# Patient Record
Sex: Female | Born: 1965 | Race: Black or African American | Hispanic: No | Marital: Single | Smoking: Former smoker
Health system: Southern US, Community
[De-identification: ages and names within clinical notes are randomized; demographics above are authoritative.]

## PROBLEM LIST (undated history)

## (undated) DIAGNOSIS — J45909 Unspecified asthma, uncomplicated: Secondary | ICD-10-CM

## (undated) DIAGNOSIS — I10 Essential (primary) hypertension: Secondary | ICD-10-CM

## (undated) DIAGNOSIS — R05 Cough: Secondary | ICD-10-CM

## (undated) DIAGNOSIS — R0602 Shortness of breath: Secondary | ICD-10-CM

## (undated) DIAGNOSIS — R600 Localized edema: Secondary | ICD-10-CM

## (undated) DIAGNOSIS — R7303 Prediabetes: Secondary | ICD-10-CM

## (undated) DIAGNOSIS — I5032 Chronic diastolic (congestive) heart failure: Secondary | ICD-10-CM

## (undated) DIAGNOSIS — R059 Cough, unspecified: Secondary | ICD-10-CM

## (undated) DIAGNOSIS — R011 Cardiac murmur, unspecified: Secondary | ICD-10-CM

## (undated) DIAGNOSIS — J189 Pneumonia, unspecified organism: Secondary | ICD-10-CM

## (undated) DIAGNOSIS — I351 Nonrheumatic aortic (valve) insufficiency: Secondary | ICD-10-CM

## (undated) DIAGNOSIS — K219 Gastro-esophageal reflux disease without esophagitis: Secondary | ICD-10-CM

## (undated) DIAGNOSIS — I77810 Thoracic aortic ectasia: Secondary | ICD-10-CM

## (undated) DIAGNOSIS — I288 Other diseases of pulmonary vessels: Secondary | ICD-10-CM

## (undated) DIAGNOSIS — J310 Chronic rhinitis: Secondary | ICD-10-CM

## (undated) HISTORY — DX: Localized edema: R60.0

## (undated) HISTORY — DX: Nonrheumatic aortic (valve) insufficiency: I35.1

## (undated) HISTORY — DX: Cardiac murmur, unspecified: R01.1

## (undated) HISTORY — DX: Pneumonia, unspecified organism: J18.9

## (undated) HISTORY — DX: Morbid (severe) obesity due to excess calories: E66.01

## (undated) HISTORY — DX: Prediabetes: R73.03

## (undated) HISTORY — DX: Gastro-esophageal reflux disease without esophagitis: K21.9

## (undated) HISTORY — DX: Chronic diastolic (congestive) heart failure: I50.32

## (undated) HISTORY — DX: Other diseases of pulmonary vessels: I28.8

## (undated) HISTORY — DX: Thoracic aortic ectasia: I77.810

## (undated) HISTORY — PX: TONSILLECTOMY: SUR1361

## (undated) HISTORY — DX: Cough, unspecified: R05.9

## (undated) HISTORY — DX: Essential (primary) hypertension: I10

## (undated) HISTORY — DX: Chronic rhinitis: J31.0

## (undated) HISTORY — PX: ANKLE RECONSTRUCTION: SHX1151

---
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
## 2005-10-28 HISTORY — PX: TOTAL VAGINAL HYSTERECTOMY: SHX2548

## 2008-09-04 ENCOUNTER — Emergency Department (HOSPITAL_COMMUNITY): Admission: EM | Admit: 2008-09-04 | Discharge: 2008-09-04 | Payer: Self-pay | Admitting: Emergency Medicine

## 2008-09-04 IMAGING — CR DG CHEST 2V
2 series · 2 of 2 positions shown · non-contrast
Comparison: None

CLINICAL DATA: Sore throat, cough, congestion, fever

CHEST - 2 VIEW

[w chest pa]
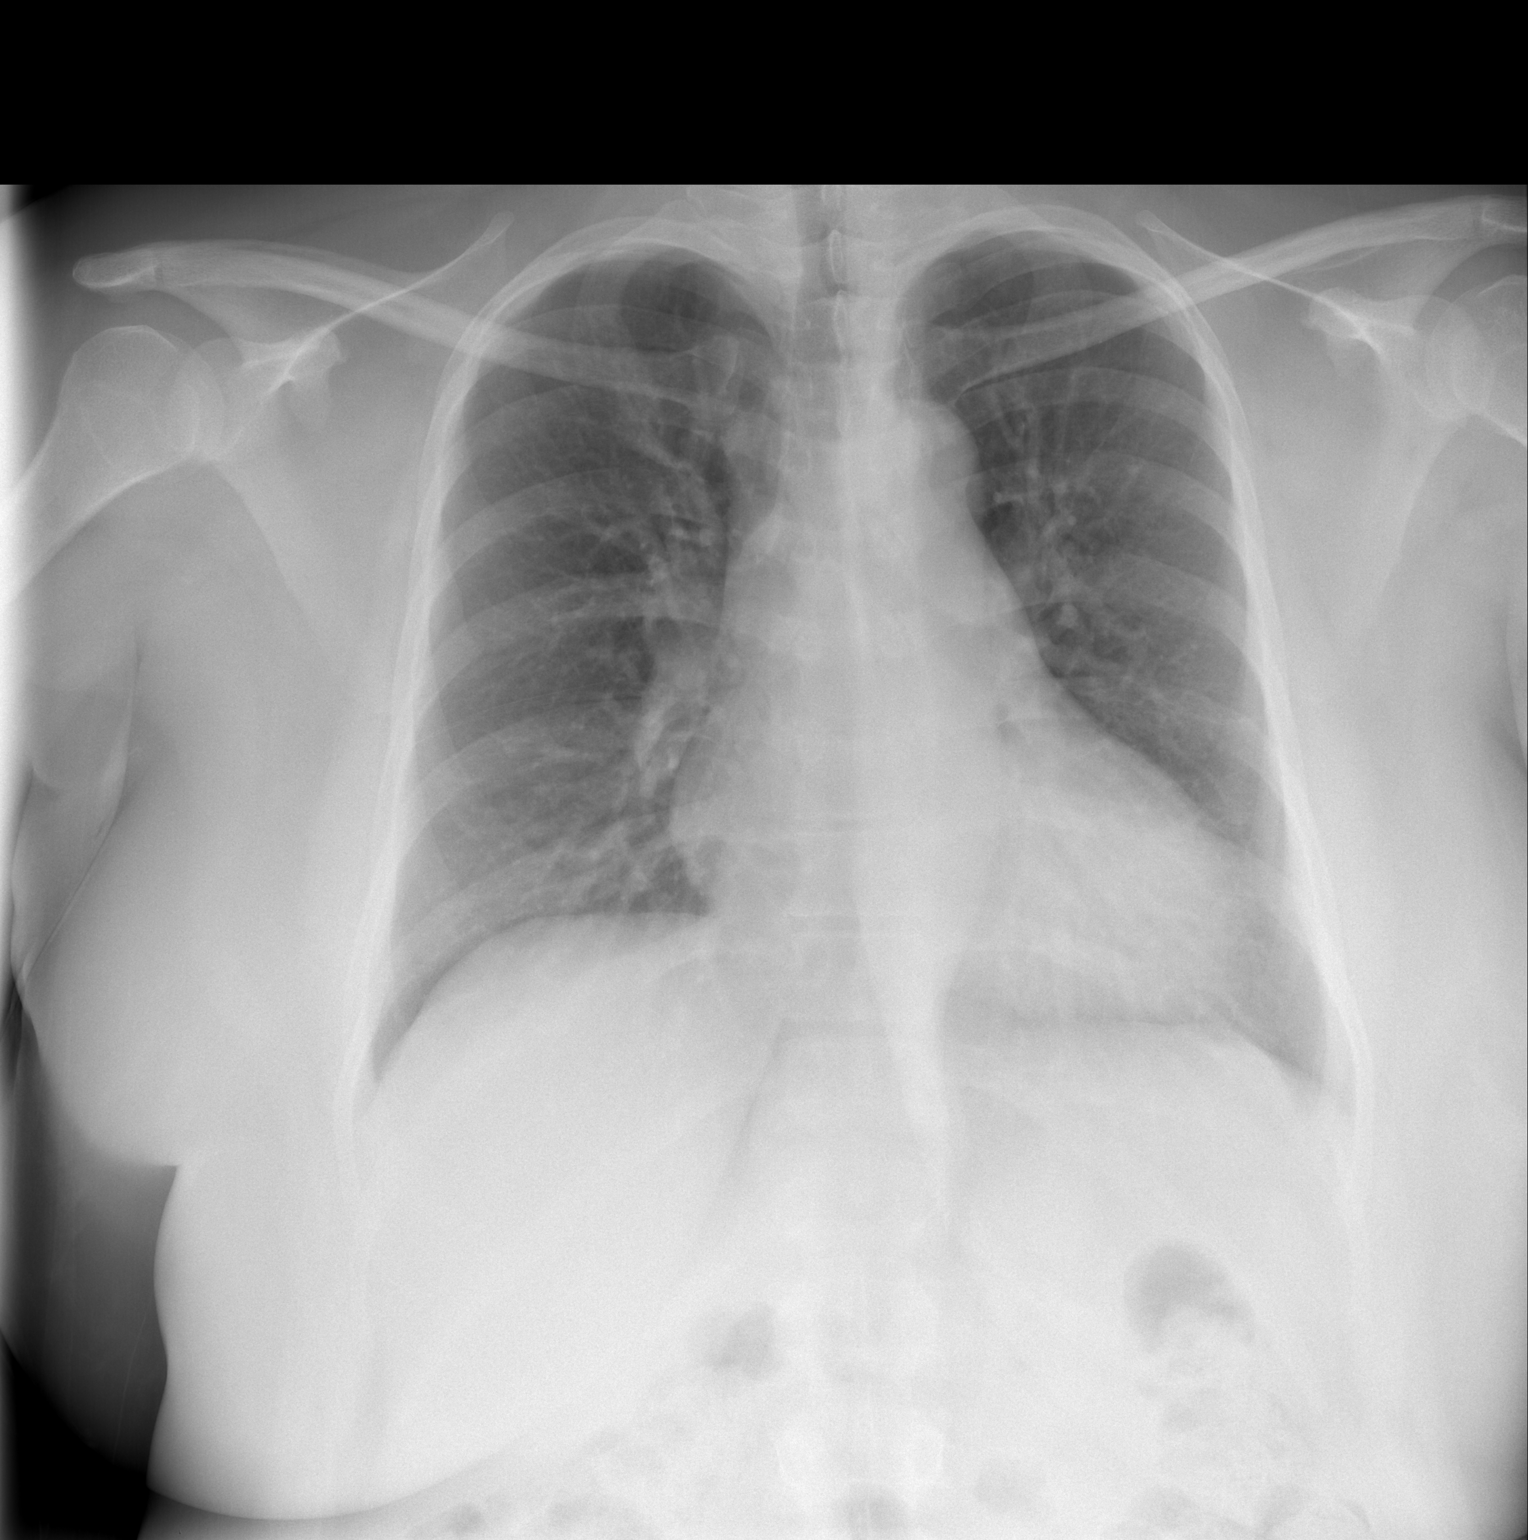

[w chest lat]
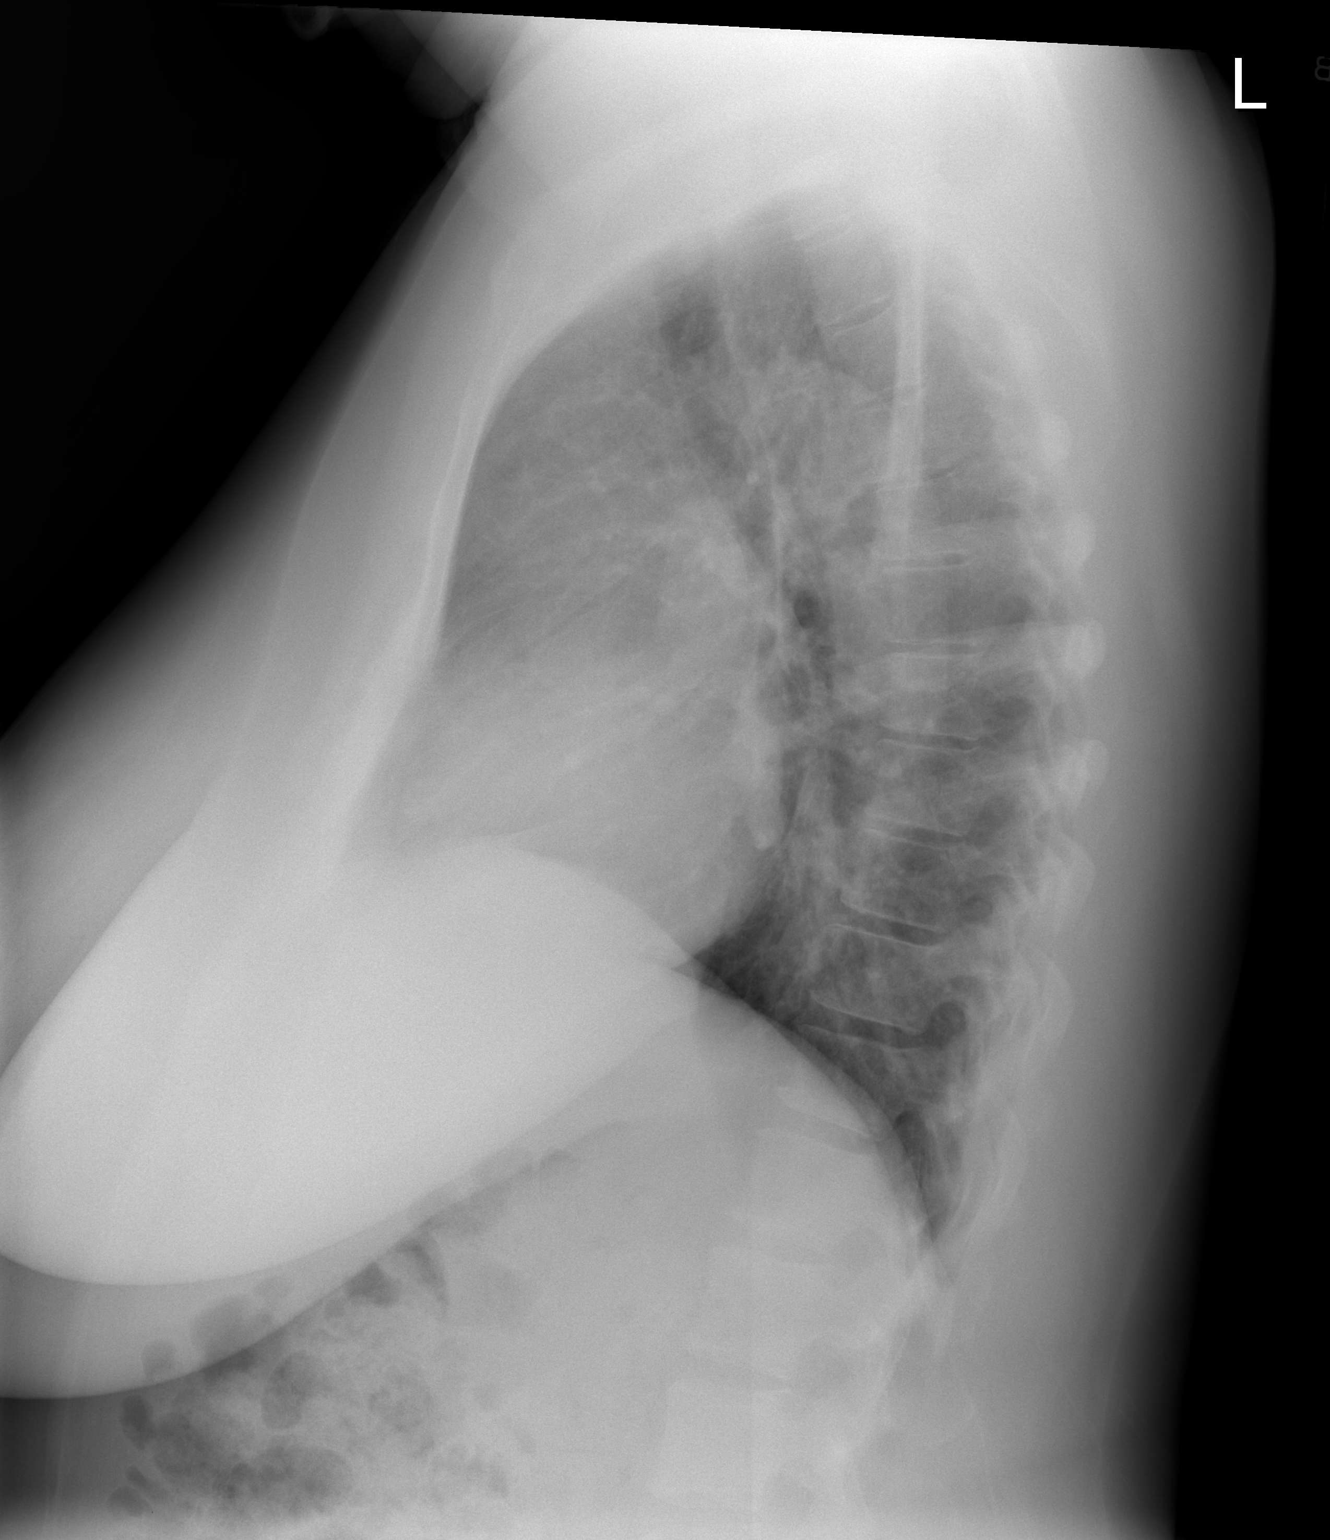

[2 of 2 positions shown; findings below may reference images not displayed]

FINDINGS: No active infiltrate or effusion is seen.  The heart is
mildly enlarged. No bony abnormality is seen.
IMPRESSION: Mild cardiomegaly. No active lung disease.

## 2008-09-04 IMAGING — CT CT NECK W/ CM
3 series · 16 of 33 positions shown, 19 images · IV contrast (APPLIED)
Comparison: None

CLINICAL DATA: Swollen painful neck

CT NECK WITH CONTRAST
TECHNIQUE: Multidetector CT imaging of the neck was performed with
intravenous contrast.
Contrast: 100 ml [9J]

[Series 4: neck_routine 3.0 b40s st · axial · 0.39mm/px · z∈[-292,-88]mm · 8 of 82 slices shown, 10 images]
[im 7/82  soft-tissue]
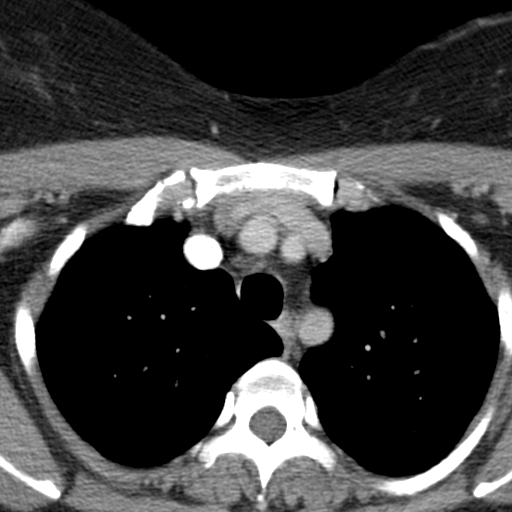
[im 7/82  bone]
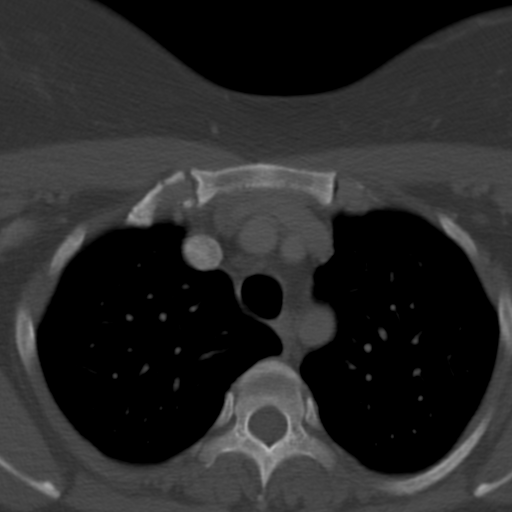
[im 19/82  bone]
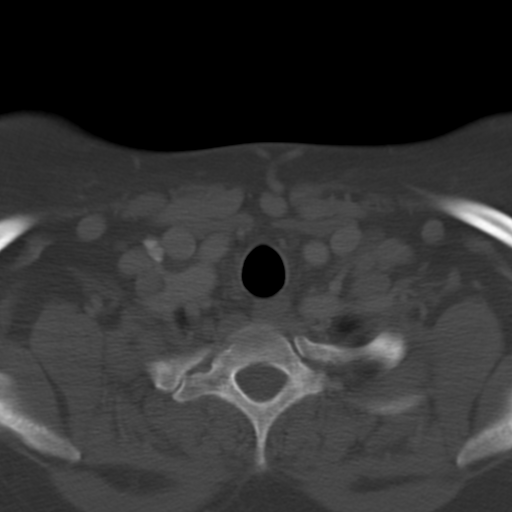
[im 25/82  bone]
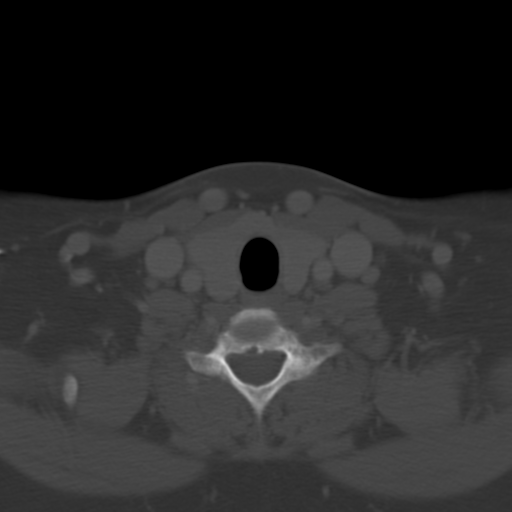
[im 38/82  bone]
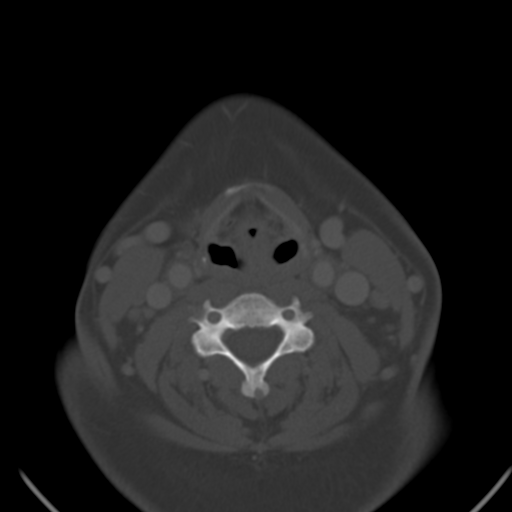
[im 44/82  soft-tissue]
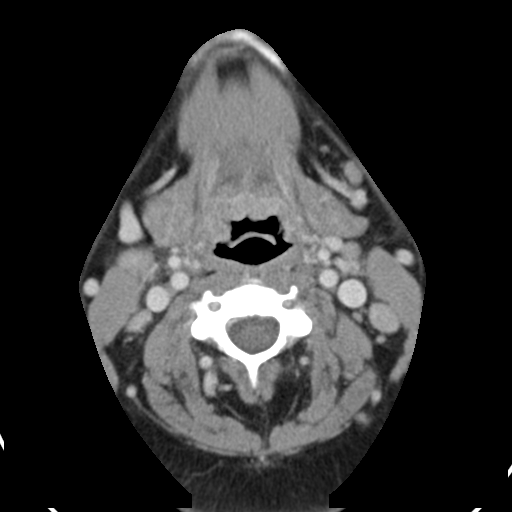
[im 44/82  bone]
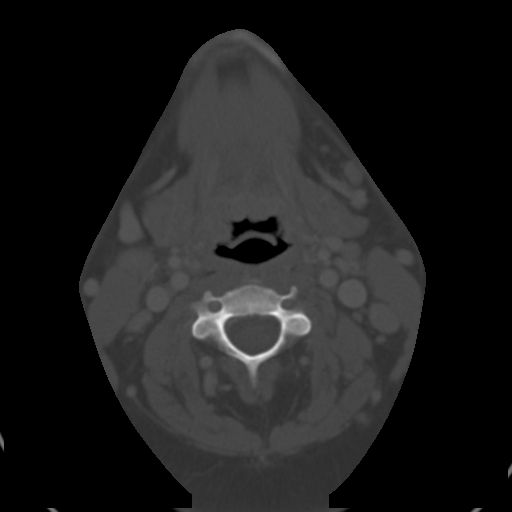
[im 57/82  bone]
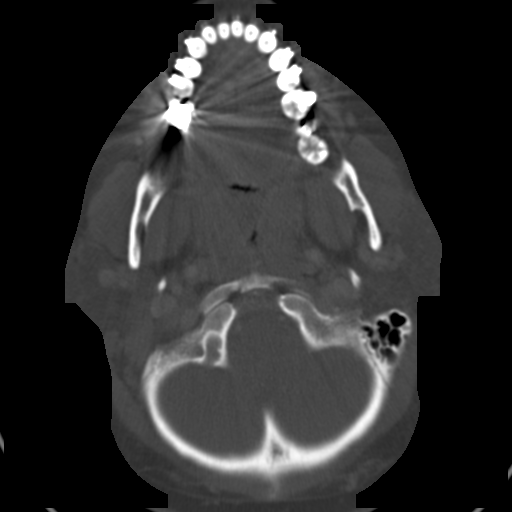
[im 63/82  bone]
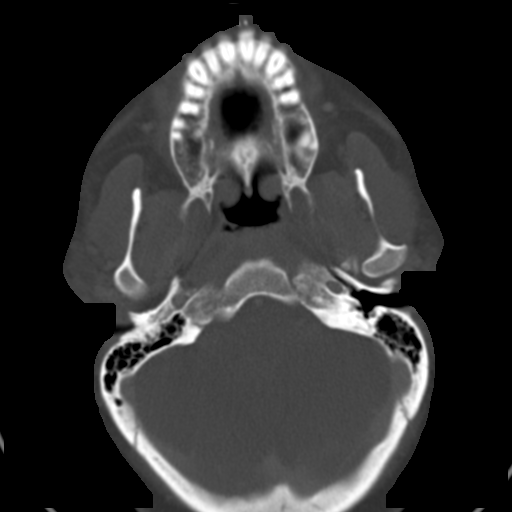
[im 75/82  bone]
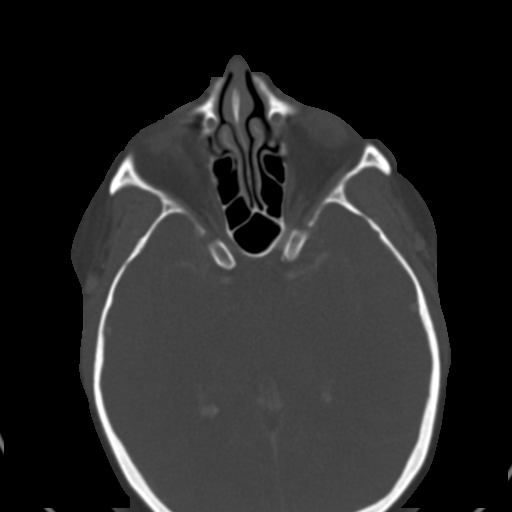

[Series 602: coronal neck · coronal · 0.48mm/px · 3 of 83 slices shown]
[im 17/83  bone]
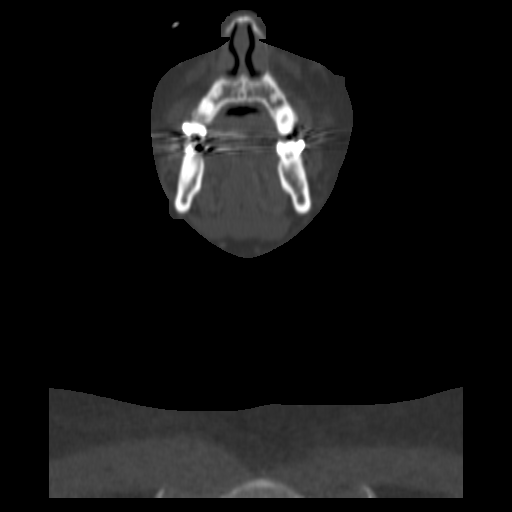
[im 33/83  bone]
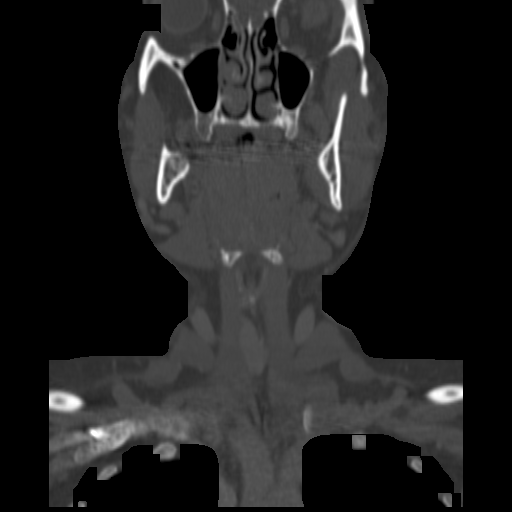
[im 50/83  bone]
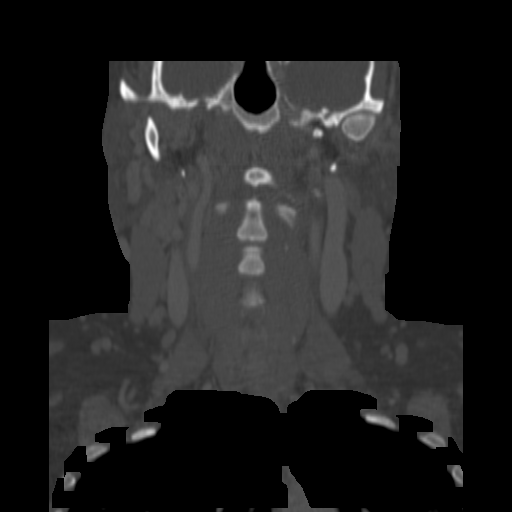

[Series 603: sagittal neck · sagittal · 0.48mm/px · 5 of 81 slices shown, 6 images]
[im 27/81  bone]
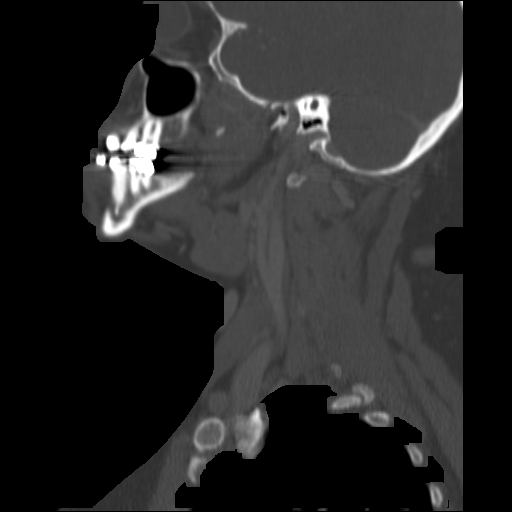
[im 34/81  bone]
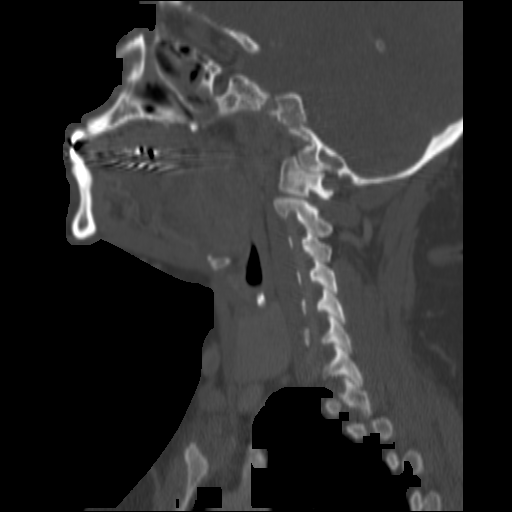
[im 41/81  soft-tissue]
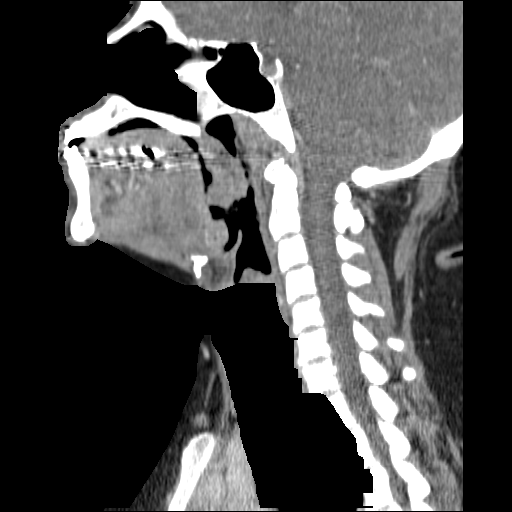
[im 41/81  bone]
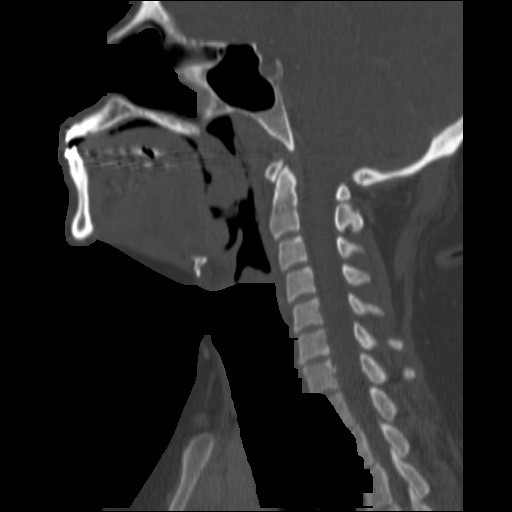
[im 47/81  bone]
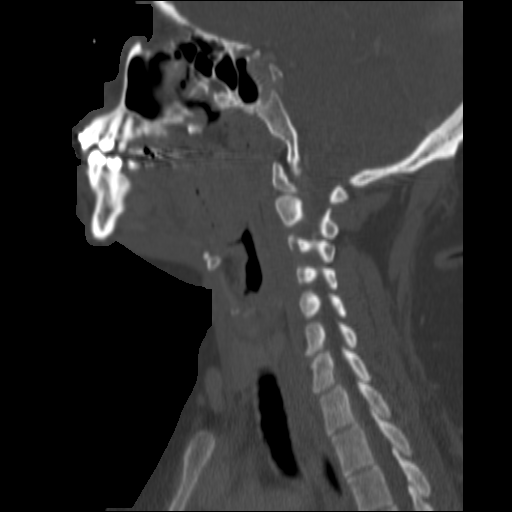
[im 54/81  bone]
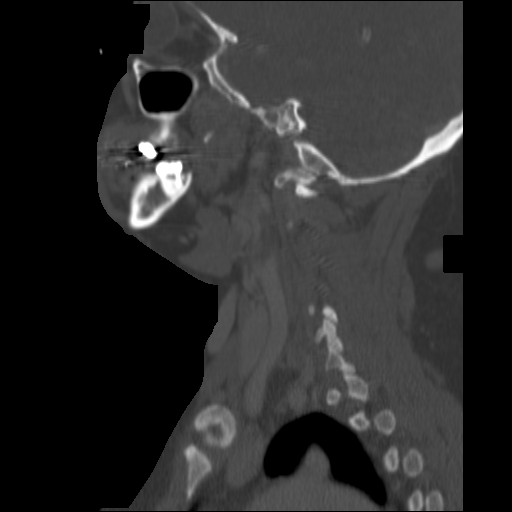

[16 of 33 positions shown; findings below may reference images not displayed]

FINDINGS: The oronasopharyngeal airway posteriorly is narrowed.
This appears be due to diffuse edema involving the tonsillar
tissue.  No abscess is seen.  The epiglottis appears normal.  The
hypopharynx is normal.  The aryepiglottic folds, pre-epiglottic fat
space, piriform sinuses, level true cords, and subglottic airway
appear normal.  The thyroid is slightly prominent diffusely with no
nodule evident.  The cervical vertebrae are normal alignment. The
parotid glands appear symmetrical and normal.
IMPRESSION: Prominent tonsillar tissue consistent with diffuse edema narrowing
the posterior oronasopharyngeal airway.  No abscess.  No
adenopathy.

## 2009-12-22 ENCOUNTER — Emergency Department (HOSPITAL_COMMUNITY): Admission: EM | Admit: 2009-12-22 | Discharge: 2009-12-22 | Payer: Self-pay | Admitting: Emergency Medicine

## 2009-12-22 IMAGING — CT CT HEAD W/O CM
1 of 2 series · 13 of 30 positions shown, 17 images · non-contrast
Comparison: None.

CLINICAL DATA: Acute stroke.  Slurred speech.  Hypertension.
Headache.

CT HEAD WITHOUT CONTRAST
TECHNIQUE: Contiguous axial images were obtained from the base of
the skull through the vertex without contrast.

[Series 2: brain · axial · 0.47mm/px · z∈[+138,+263]mm · 13 of 28 slices shown, 17 images]
[im 2/28  brain]
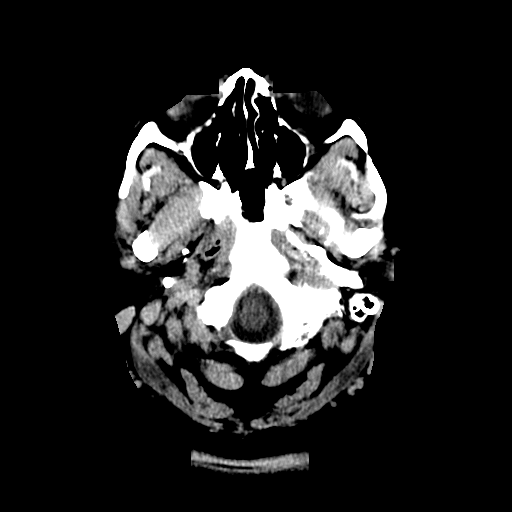
[im 2/28  bone]
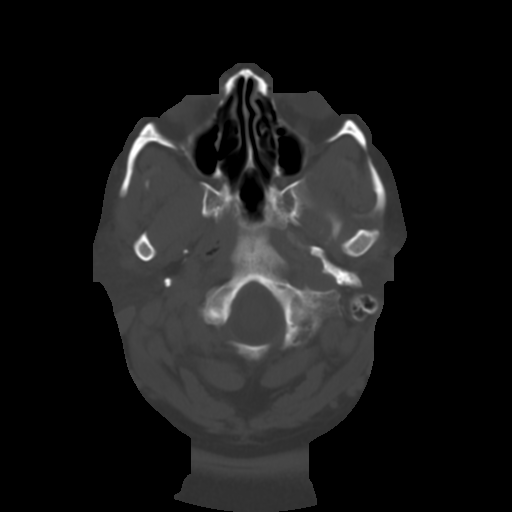
[im 4/28  brain]
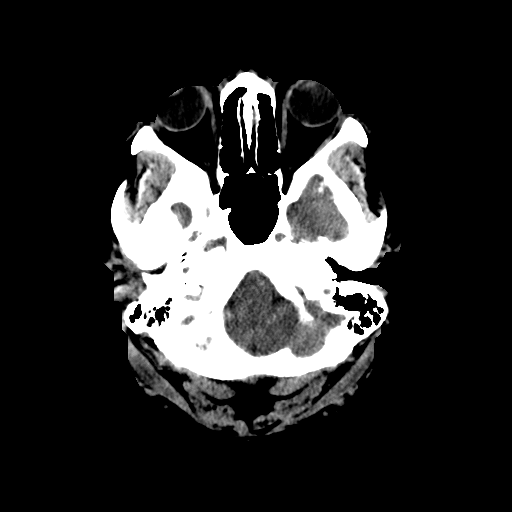
[im 6/28  brain]
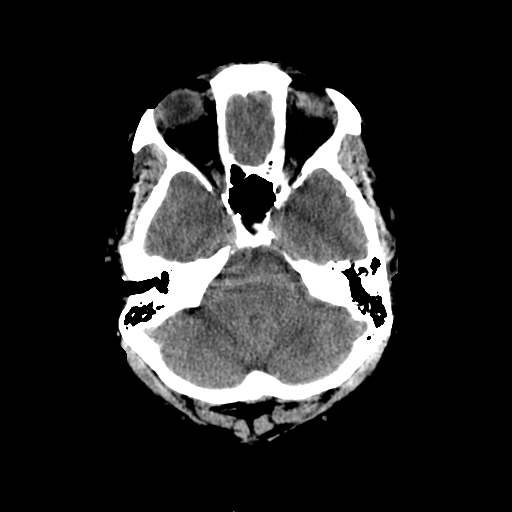
[im 8/28  brain]
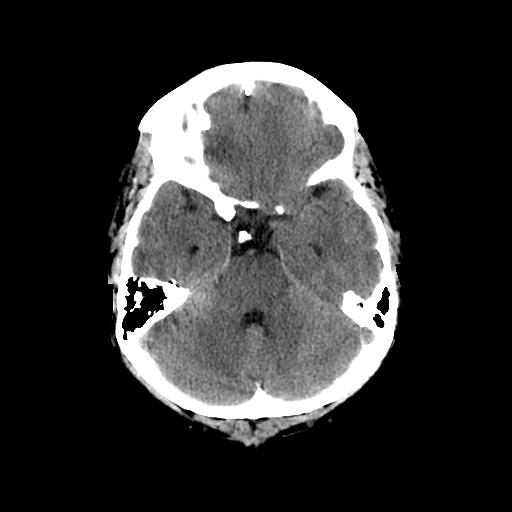
[im 10/28  brain]
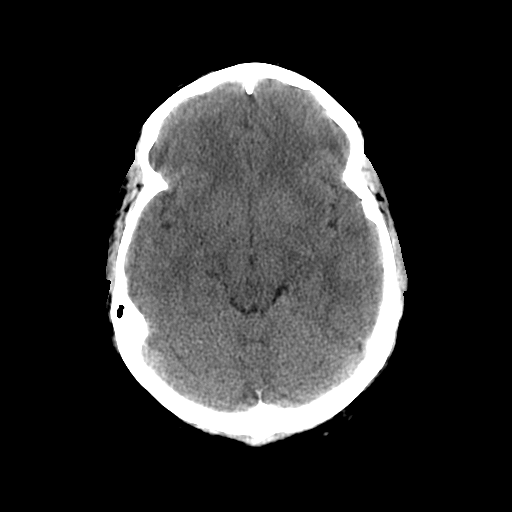
[im 10/28  bone]
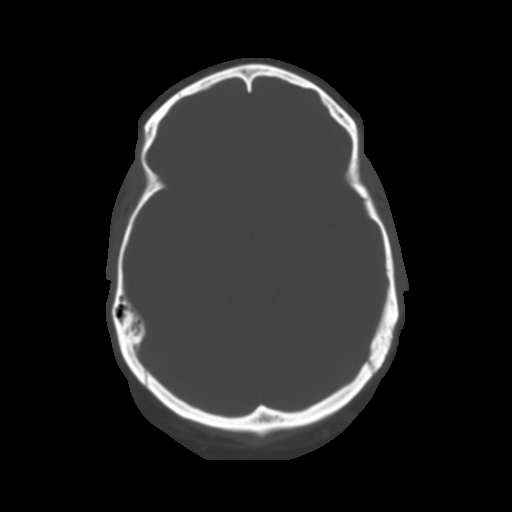
[im 12/28  brain]
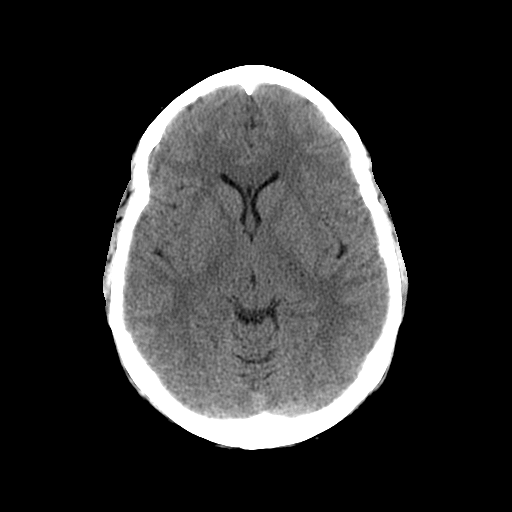
[im 14/28  brain]
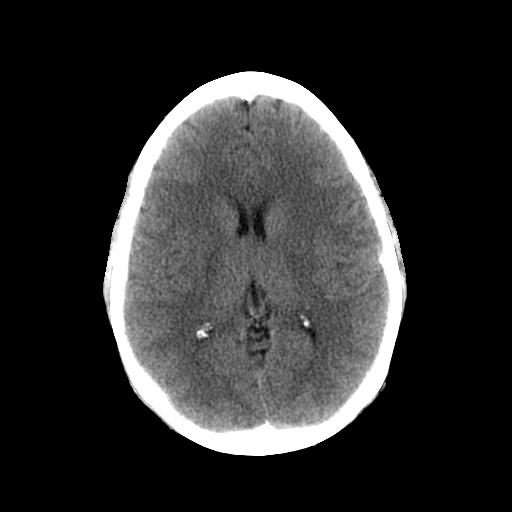
[im 16/28  brain]
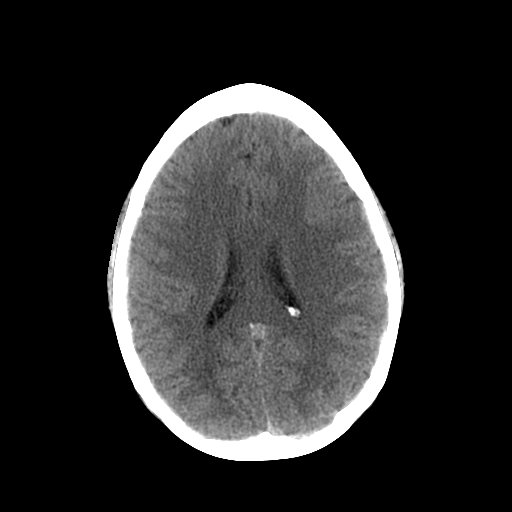
[im 18/28  brain]
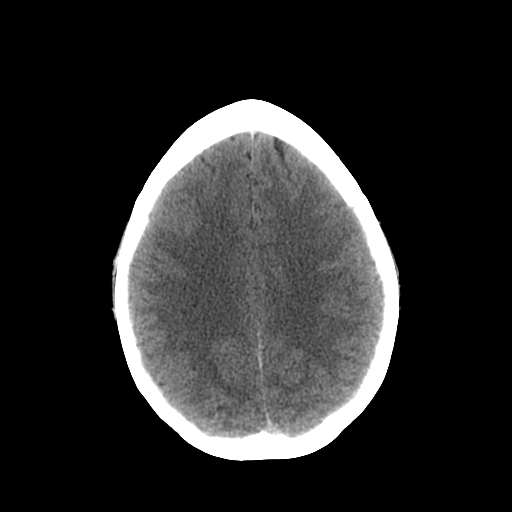
[im 18/28  bone]
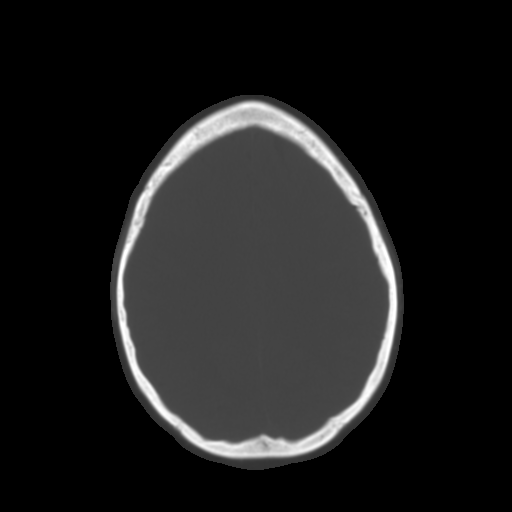
[im 20/28  brain]
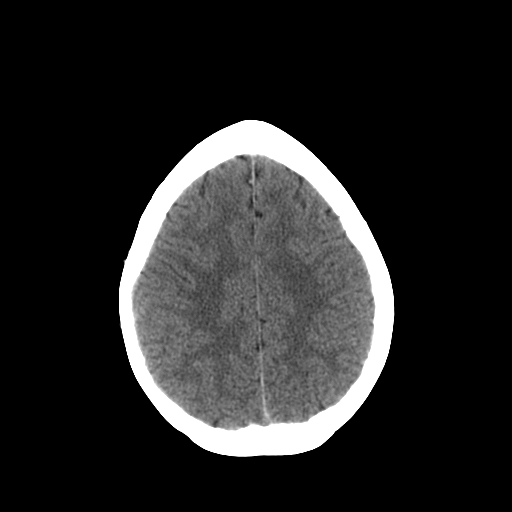
[im 22/28  brain]
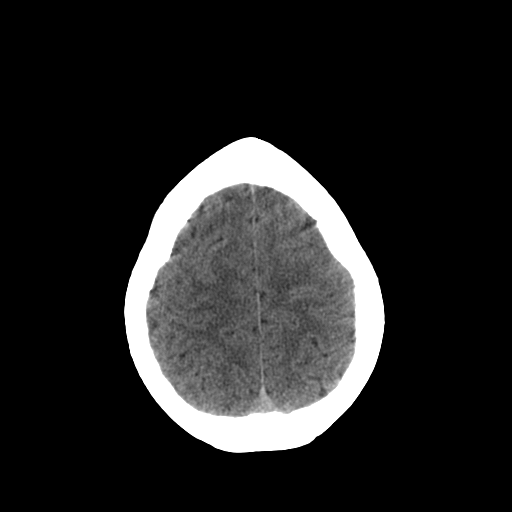
[im 24/28  brain]
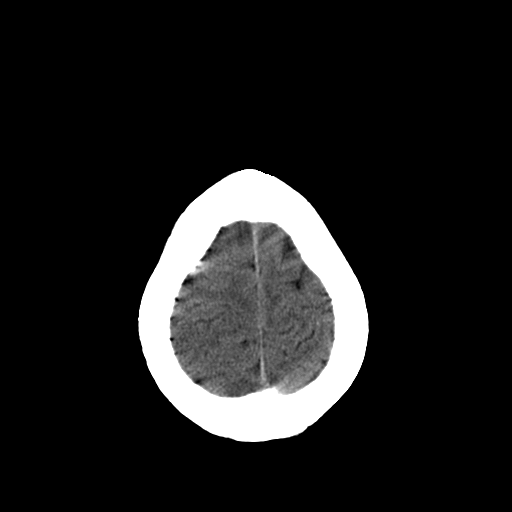
[im 26/28  brain]
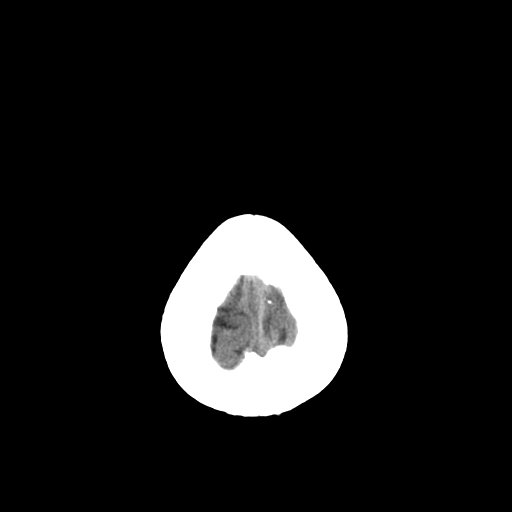
[im 26/28  bone]
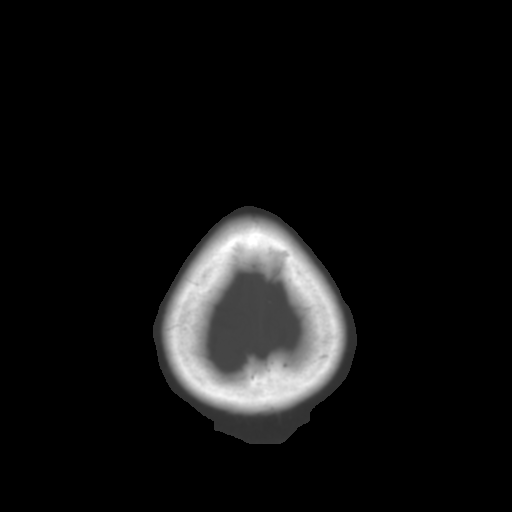

[13 of 30 positions shown; findings below may reference images not displayed]

FINDINGS: There is no evidence of intracranial hemorrhage, brain
edema or other signs of acute infarction.  There is no evidence of
intracranial mass lesion or mass effect.  No abnormal extra-axial
fluid collections are identified.

Ventricles are normal in size.  No skull abnormality identified.
IMPRESSION: Negative noncontrast head CT.

## 2011-01-18 LAB — CK TOTAL AND CKMB (NOT AT ARMC)
Relative Index: INVALID (ref 0.0–2.5)
Total CK: 85 U/L (ref 7–177)

## 2011-01-18 LAB — DIFFERENTIAL
Basophils Absolute: 0 10*3/uL (ref 0.0–0.1)
Eosinophils Relative: 3 % (ref 0–5)
Monocytes Absolute: 0.6 10*3/uL (ref 0.1–1.0)
Monocytes Relative: 8 % (ref 3–12)

## 2011-01-18 LAB — COMPREHENSIVE METABOLIC PANEL
Albumin: 3.7 g/dL (ref 3.5–5.2)
Alkaline Phosphatase: 83 U/L (ref 39–117)
Calcium: 9.1 mg/dL (ref 8.4–10.5)
Glucose, Bld: 98 mg/dL (ref 70–99)
Sodium: 141 mEq/L (ref 135–145)
Total Protein: 7.5 g/dL (ref 6.0–8.3)

## 2011-01-18 LAB — CBC
HCT: 40.5 % (ref 36.0–46.0)
MCHC: 33.2 g/dL (ref 30.0–36.0)
Platelets: 225 10*3/uL (ref 150–400)

## 2011-01-18 LAB — APTT: aPTT: 27 seconds (ref 24–37)

## 2011-01-18 LAB — PROTIME-INR: INR: 0.95 (ref 0.00–1.49)

## 2011-07-30 LAB — CBC
HCT: 39.7
RBC: 4.82
WBC: 15.1 — ABNORMAL HIGH

## 2011-07-30 LAB — URINALYSIS, ROUTINE W REFLEX MICROSCOPIC
Bilirubin Urine: NEGATIVE
Hgb urine dipstick: NEGATIVE
Protein, ur: NEGATIVE
Urobilinogen, UA: 1

## 2011-07-30 LAB — POCT I-STAT, CHEM 8
Calcium, Ion: 1.2
Chloride: 107
Glucose, Bld: 113 — ABNORMAL HIGH
HCT: 40

## 2011-07-30 LAB — DIFFERENTIAL
Basophils Relative: 0
Eosinophils Relative: 0
Lymphocytes Relative: 16
Monocytes Absolute: 0.7
Monocytes Relative: 5

## 2011-12-20 ENCOUNTER — Ambulatory Visit: Payer: Self-pay | Admitting: Internal Medicine

## 2011-12-20 ENCOUNTER — Ambulatory Visit: Payer: Self-pay

## 2011-12-20 VITALS — BP 184/101 | HR 72 | Temp 98.3°F | Resp 20 | Ht 63.5 in | Wt 234.0 lb

## 2011-12-20 DIAGNOSIS — R05 Cough: Secondary | ICD-10-CM

## 2011-12-20 DIAGNOSIS — R059 Cough, unspecified: Secondary | ICD-10-CM

## 2011-12-20 DIAGNOSIS — J40 Bronchitis, not specified as acute or chronic: Secondary | ICD-10-CM

## 2011-12-20 DIAGNOSIS — I517 Cardiomegaly: Secondary | ICD-10-CM

## 2011-12-20 DIAGNOSIS — I10 Essential (primary) hypertension: Secondary | ICD-10-CM | POA: Insufficient documentation

## 2011-12-20 DIAGNOSIS — R011 Cardiac murmur, unspecified: Secondary | ICD-10-CM

## 2011-12-20 LAB — POCT CBC
Hemoglobin: 13.1 g/dL (ref 12.2–16.2)
MCH, POC: 26.1 pg — AB (ref 27–31.2)
MCV: 81.2 fL (ref 80–97)
MID (cbc): 0.7 (ref 0–0.9)
MPV: 10.5 fL (ref 0–99.8)
POC LYMPH PERCENT: 39.1 %L (ref 10–50)
RBC: 5.01 M/uL (ref 4.04–5.48)
WBC: 6.2 10*3/uL (ref 4.6–10.2)

## 2011-12-20 LAB — BASIC METABOLIC PANEL
CO2: 27 mEq/L (ref 19–32)
Glucose, Bld: 85 mg/dL (ref 70–99)
Potassium: 4.2 mEq/L (ref 3.5–5.3)
Sodium: 142 mEq/L (ref 135–145)

## 2011-12-20 IMAGING — CR DG CHEST 2V
2 series · 2 of 2 positions shown · non-contrast
Comparison: Two-view chest x-ray [DATE] [HOSPITAL].

CLINICAL DATA: Cough.  Chest congestion.  Newly diagnosed heart
murmur.

CHEST - 2 VIEW [DATE]:

[PA]
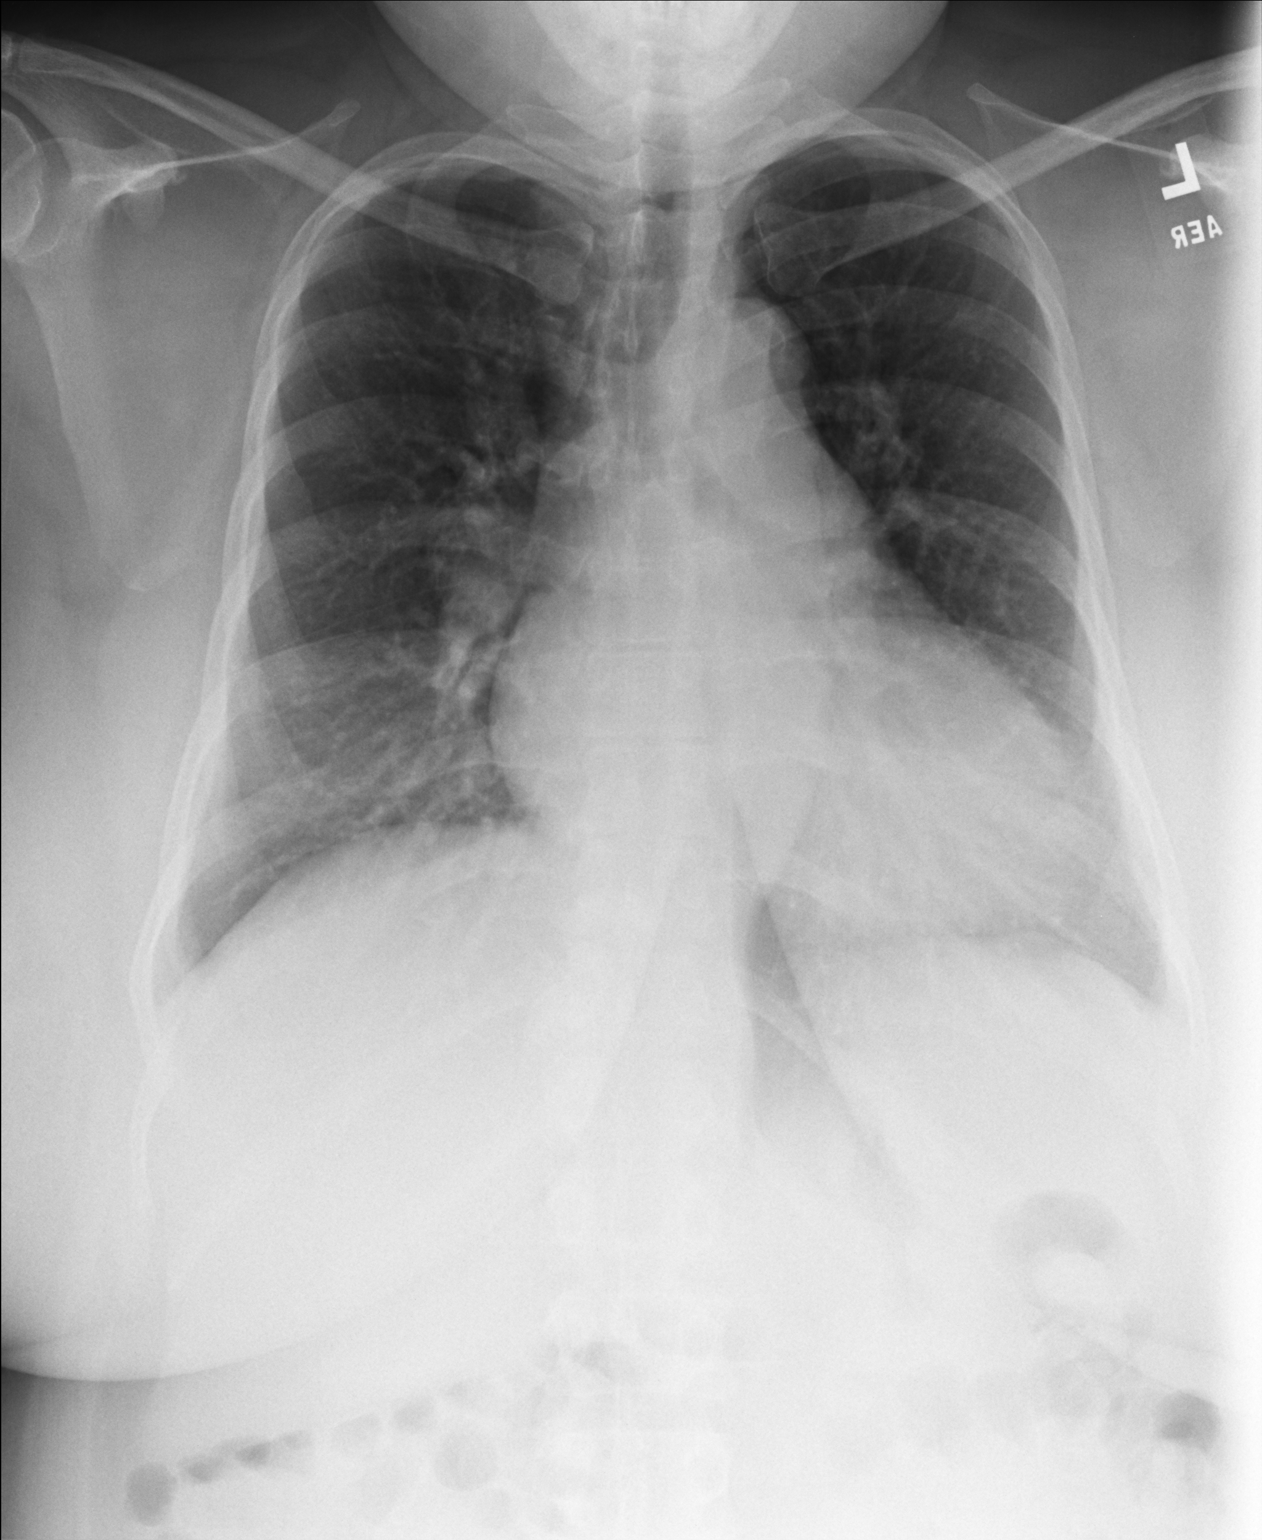

[lateral]
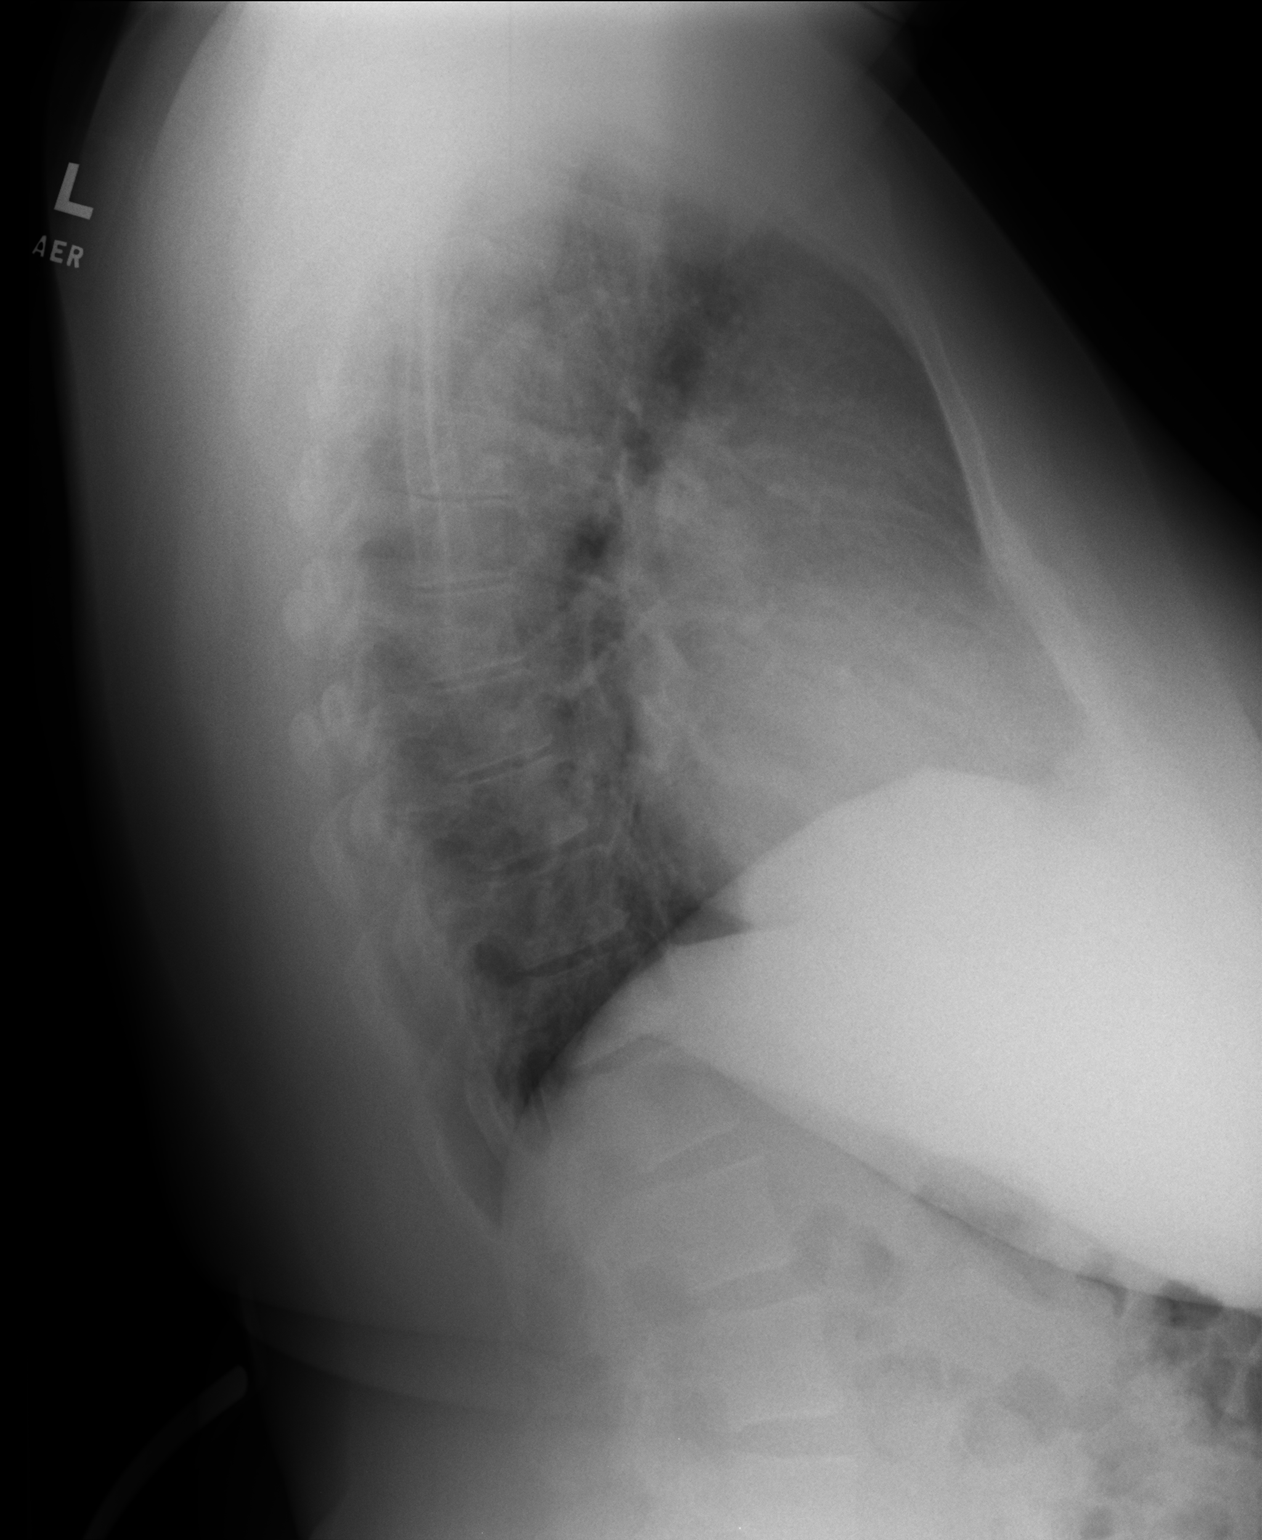

[2 of 2 positions shown; findings below may reference images not displayed]

FINDINGS: Cardiac silhouette moderately enlarged, with interval
slight increase in size in the interval.  Thoracic aorta tortuous,
unchanged.  Hilar and mediastinal contours otherwise unremarkable.
Mildly prominent bronchovascular markings diffusely and central
peribronchial thickening, more so than on the prior examination.
Lungs otherwise clear.  No pleural effusions.  Visualized bony
thorax intact.
IMPRESSION: Moderate cardiomegaly, with interval slight increase in heart size
since [OY].  Mild changes of acute bronchitis and/or asthma without
localized pneumonia.

## 2011-12-20 MED ORDER — AZITHROMYCIN 500 MG PO TABS: 500.0000 mg | ORAL_TABLET | Freq: Every day | ORAL | Status: AC

## 2011-12-20 MED ORDER — LISINOPRIL-HYDROCHLOROTHIAZIDE 10-12.5 MG PO TABS: 1.0000 | ORAL_TABLET | Freq: Every day | ORAL | Status: DC

## 2011-12-20 MED ORDER — HYDROCODONE-HOMATROPINE 5-1.5 MG/5ML PO SYRP: ORAL_SOLUTION | ORAL | Status: AC

## 2011-12-20 NOTE — Progress Notes (Signed)
Patient ID: Jessica Snow MRN: [id number], DOB: 10/31/1965, 46 y.o. Date of Encounter: 12/20/2011, 4:56 PM  Primary Physician: No primary provider on file.  Chief Complaint:  Chief Complaint  Patient presents with  . Cough    x3 days for all symptoms  . Nasal Congestion  . Sore Throat    HPI: 46 y.o. year old female presents with 3 day history of nasal congestion, sore throat, post nasal drip, and cough. Afebrile. No chills. Some myalgias. Cough is nonproductive, not associated with time of day. Nasal congestion thick and clear. Headache located along bilateral temples. Normal hearing. Normal appetite. No GI symptoms. Continues to smoke.   Has previously been on HCTZ for HTN. Stopped it "couple years" ago. Does not check at home. No chest pain, vision changes, or focal deficits. Thinks BP was 140/something [facility], approximately 6 months prior. Stopped taking medication due to did not want to pay for follow up visits. Poor diet without exercise. Never told has a cardiac murmur before. BP recheck 170/101. Denies ETOH or illicit substances.  No leg trauma, sedentary periods, or h/o cancer.  Past Medical History  Diagnosis Date  . HTN (hypertension)      Home Meds: Prior to Admission medications   Medication Sig Start Date End Date Taking? Authorizing Provider  acetaminophen (OFIRMEV) 10 MG/ML SOLN Inject 500 mg into the vein every 6 (six) hours.    Yes Historical Provider, MD    Allergies: No Known Allergies  History   Social History  . Marital Status: Single    Spouse Name: N/A    Number of Children: N/A  . Years of Education: N/A   Occupational History  . Not on file.   Social History Main Topics  . Smoking status: Current Everyday Smoker -- 0.5 packs/day    Types: Cigarettes  . Smokeless tobacco: Not on file  . Alcohol Use: Not on file  . Drug Use: Not on file  . Sexually Active: Not on file   Other Topics Concern  . Not on file   Social  History Narrative  . No narrative on file     Review of Systems: Constitutional: negative for chills, fever, night sweats or weight changes Cardiovascular: negative for chest pain or palpitations Respiratory: negative for hemoptysis, wheezing, or shortness of breath Abdominal: negative for abdominal pain, nausea, vomiting or diarrhea Dermatological: negative for rash Neurologic: negative for dizziness, syncope, or paresthesias    Physical Exam: Blood pressure 184/101, pulse 72, temperature 98.3 F (36.8 C), temperature source Oral, resp. rate 20, height 5' 3.5" (1.613 m), weight 234 lb (106.142 kg), last menstrual period 12/19/2005., Body mass index is 40.80 kg/(m^2). General: Well developed, well nourished, in no acute distress. Head: Normocephalic, atraumatic, eyes without discharge, sclera non-icteric, nares are congested. Bilateral auditory canals clear, TM's are without perforation, pearly grey with reflective cone of light bilaterally. Serous effusion bilaterally behind TM's. Maxillary sinus TTP. Oral cavity moist, dentition normal. Posterior pharynx with post nasal drip and mild erythema. No peritonsillar abscess or tonsillar exudate. Neck: Supple. No thyromegaly. Full ROM. No lymphadenopathy. Lungs: Clear bilaterally to auscultation without wheezes, rales, or rhonchi. Breathing is unlabored. Heart: RRR with S1 S2. 3/6 systolic murmur, rubs, or gallops appreciated. No carotid bruits. Abdomen: Soft, non-tender, non-distended with normoactive bowel sounds. No hepatomegaly. No rebound/guarding. No obvious abdominal masses. No abdominal bruits. Msk:  Strength and tone normal for age. Extremities: No clubbing or cyanosis. No edema. Calves supple bilaterally. Neuro:  Alert and oriented X 3. Moves all extremities spontaneously. CNII-XII grossly in tact. Psych:  Responds to questions appropriately with a normal affect.   Labs: Results for orders placed in visit on 12/20/11  POCT CBC       Component Value Range   WBC 6.2  4.6 - 10.2 (K/uL)   Lymph, poc 2.4  0.6 - 3.4    POC LYMPH PERCENT 39.1  10 - 50 (%L)   MID (cbc) 0.7  0 - 0.9    POC MID % 10.7  0 - 12 (%M)   POC Granulocyte 3.1  2 - 6.9    Granulocyte percent 50.2  37 - 80 (%G)   RBC 5.01  4.04 - 5.48 (M/uL)   Hemoglobin 13.1  12.2 - 16.2 (g/dL)   HCT, POC 98.1  19.1 - 47.9 (%)   MCV 81.2  80 - 97 (fL)   MCH, POC 26.1 (*) 27 - 31.2 (pg)   MCHC 32.2  31.8 - 35.4 (g/dL)   RDW, POC 47.8     Platelet Count, POC 244  142 - 424 (K/uL)   MPV 10.5  0 - 99.8 (fL)   BMP and BNP pending  [facility] reading (PRIMARY) by  Dr. Merla Riches. Cardiomegaly. ? Aortic impression with abnormal tracheal deviation. Increased markings RLL.    ASSESSMENT AND PLAN:  46 y.o. year old female with uncontrolled HTN, newly diagnosed cardiac murmur, bronchitis, and cough. 1. HTN -Uncontrolled -Start lisinopril/HCTZ 10/12.5 mg 1 po daily #30 RF1 -Await labs -Healthy diet -Recheck 3-4 weeks -RTC/ER precautions  2. Cardiac murmur -Per patient this is a new diagnosis -Referral to cardiology for evaluation and treatment of murmur and abnormal CXR/aortic impression into trachea.  3. Bronchitis/cough -Azithromycin 500 mg #5 1 po daily no RF -Hycodan #4 oz 1 tsp po q 4-6 hours prn cough no RF SED -Mucinex -Tylenol/Motrin prn -Rest/fluids -RTC precautions -RTC 3-5 days if no improvement  Elinor Dodge, PA-C 12/20/2011 4:56 PM

## 2011-12-20 NOTE — Progress Notes (Deleted)
  Subjective:    Patient ID: Jessica Snow, female    DOB: November 02, 1965, 46 y.o.   MRN: [id number]  HPI    Review of Systems     Objective:   Physical Exam        Assessment & Plan:

## 2011-12-21 ENCOUNTER — Telehealth: Payer: Self-pay

## 2011-12-21 LAB — BRAIN NATRIURETIC PEPTIDE: Brain Natriuretic Peptide: 26.1 pg/mL (ref 0.0–100.0)

## 2011-12-21 NOTE — Telephone Encounter (Signed)
Pt states that she spoke with the clinical TL earlier today regarding her heart,  and just wanted to ask did she still have to see a heart doctor.

## 2011-12-22 NOTE — Telephone Encounter (Signed)
Spoke with patient, advised we would still need her to see cards for eval of murmur and her aorta.  Pt understood.

## 2012-01-20 ENCOUNTER — Encounter: Payer: Self-pay | Admitting: Cardiology

## 2012-01-20 ENCOUNTER — Ambulatory Visit (INDEPENDENT_AMBULATORY_CARE_PROVIDER_SITE_OTHER): Payer: Self-pay | Admitting: Cardiology

## 2012-01-20 VITALS — BP 150/85 | HR 69 | Ht 63.0 in | Wt 231.0 lb

## 2012-01-20 DIAGNOSIS — I10 Essential (primary) hypertension: Secondary | ICD-10-CM

## 2012-01-20 DIAGNOSIS — R011 Cardiac murmur, unspecified: Secondary | ICD-10-CM

## 2012-01-20 DIAGNOSIS — E669 Obesity, unspecified: Secondary | ICD-10-CM

## 2012-01-20 NOTE — Patient Instructions (Signed)
Your physician has requested that you have an echocardiogram. Echocardiography is a painless test that uses sound waves to create images of your heart. It provides your doctor with information about the size and shape of your heart and how well your heart's chambers and valves are working. This procedure takes approximately one hour. There are no restrictions for this procedure.  The current medical regimen is effective;  continue present plan and medications.'  Follow up with Dr Antoine Poche as needed based on results

## 2012-01-20 NOTE — Progress Notes (Signed)
   HPI The patient presents for evaluation of an abnormal CXR with cardiomegaly. She was also noted to have a heart murmur which has not been described previously. She was being seen in urgent care for treatment of bronchitis. He noted the above. She has not had any prior cardiac history. She has never had any cardiac testing. She does describe some dyspnea with activity such as walking a long distance or up a flight of stairs. However, this has been chronic. She does not describe PND or orthopnea.  She does not describe chest pressure, neck or arm discomfort. She doesn't have palpitations, presyncope or syncope. Her weights have been stable. She doesn't edema. She does snore but doesn't have any other symptoms consistent with sleep apnea. He was recently started on medications for her blood pressure.  No Known Allergies  Current Outpatient Prescriptions  Medication Sig Dispense Refill  . lisinopril-hydrochlorothiazide (PRINZIDE,ZESTORETIC) 10-12.5 MG per tablet Take 1 tablet by mouth daily.  90 tablet  3    Past Medical History  Diagnosis Date  . HTN (hypertension)   . Pneumonia     Past Surgical History  Procedure Date  . Total vaginal hysterectomy 2007    Family History  Problem Relation Age of Onset  . Hypertension Mother   . Diabetes Mother     History   Social History  . Marital Status: Single    Spouse Name: N/A    Number of Children: N/A  . Years of Education: N/A   Occupational History  . Production designer, theatre/television/film at [employer].    Social History Main Topics  . Smoking status: Current Everyday Smoker -- 0.5 packs/day for 10 years    Types: Cigarettes  . Smokeless tobacco: Not on file   Comment: Off and on.  . Alcohol Use: Not on file  . Drug Use: Not on file  . Sexually Active: Not on file   Other Topics Concern  . Not on file   Social History Narrative   Lives with two children.      ROS:  Positive for headaches, occasional diarrhea and constipation.  Otherwise as stated in  the HPI and negative for all other systems.  PHYSICAL EXAM VS  BP  150/85, HR 69 GENERAL:  Well appearing HEENT:  Pupils equal round and reactive, fundi not visualized, oral mucosa unremarkable NECK:  No jugular venous distention, waveform within normal limits, carotid upstroke brisk and symmetric, no bruits, no thyromegaly LYMPHATICS:  No cervical, inguinal adenopathy LUNGS:  Clear to auscultation bilaterally BACK:  No CVA tenderness CHEST:  Unremarkable HEART:  PMI not displaced or sustained,S1 and S2 within normal limits, no S3, no S4, no clicks, no rubs, apical systolic murmur at the left upper sternal border 2/6 early peaking without diastolic murmur. ABD:  Flat, positive bowel sounds normal in frequency in pitch, no bruits, no rebound, no guarding, no midline pulsatile mass, no hepatomegaly, no splenomegaly, obese EXT:  2 plus pulses throughout, no edema, no cyanosis no clubbing SKIN:  No rashes no nodules NEURO:  Cranial nerves II through XII grossly intact, motor grossly intact throughout PSYCH:  Cognitively intact, oriented to person place and time  EKG:  Sinus rhythm, rate 69, axis within normal limits, intervals within normal limits, no acute ST-T wave changes.  ASSESSMENT AND PLAN

## 2012-01-20 NOTE — Assessment & Plan Note (Signed)
I have asked her to watch her BP with readings [facility] her house.  I also suggested weight loss.  At this point I will not change the meds as this was recently started.

## 2012-01-20 NOTE — Assessment & Plan Note (Signed)
I suspect a flow murmur or may be aortic sclerosis. I do not suspect significant pathology but I will followup with an echo. Further evaluation and treatment will be based on this result. Of note this will also evaluate the questionable cardiomegaly seen on chest x-ray.

## 2012-01-20 NOTE — Assessment & Plan Note (Signed)
The patient understands the need to lose weight with diet and exercise. We have discussed specific strategies for this.  I suggested weight watchers.

## 2012-01-31 ENCOUNTER — Ambulatory Visit (HOSPITAL_COMMUNITY): Payer: Self-pay | Attending: Cardiology

## 2012-01-31 ENCOUNTER — Other Ambulatory Visit: Payer: Self-pay

## 2012-01-31 DIAGNOSIS — R011 Cardiac murmur, unspecified: Secondary | ICD-10-CM | POA: Insufficient documentation

## 2012-01-31 DIAGNOSIS — I10 Essential (primary) hypertension: Secondary | ICD-10-CM | POA: Insufficient documentation

## 2013-10-08 ENCOUNTER — Encounter (HOSPITAL_COMMUNITY): Payer: Self-pay | Admitting: Emergency Medicine

## 2013-10-08 ENCOUNTER — Emergency Department (HOSPITAL_COMMUNITY)
Admission: EM | Admit: 2013-10-08 | Discharge: 2013-10-08 | Disposition: A | Discharge status: Home / Self Care | Payer: Self-pay | Attending: Emergency Medicine | Admitting: Emergency Medicine

## 2013-10-08 ENCOUNTER — Emergency Department (HOSPITAL_COMMUNITY): Payer: Self-pay

## 2013-10-08 DIAGNOSIS — I10 Essential (primary) hypertension: Secondary | ICD-10-CM | POA: Insufficient documentation

## 2013-10-08 DIAGNOSIS — Z79899 Other long term (current) drug therapy: Secondary | ICD-10-CM | POA: Insufficient documentation

## 2013-10-08 DIAGNOSIS — F17200 Nicotine dependence, unspecified, uncomplicated: Secondary | ICD-10-CM | POA: Insufficient documentation

## 2013-10-08 DIAGNOSIS — Z8701 Personal history of pneumonia (recurrent): Secondary | ICD-10-CM | POA: Insufficient documentation

## 2013-10-08 DIAGNOSIS — J40 Bronchitis, not specified as acute or chronic: Secondary | ICD-10-CM | POA: Insufficient documentation

## 2013-10-08 LAB — CBC WITH DIFFERENTIAL/PLATELET
Basophils Absolute: 0 10*3/uL (ref 0.0–0.1)
Basophils Relative: 1 % (ref 0–1)
Eosinophils Absolute: 0.2 10*3/uL (ref 0.0–0.7)
Eosinophils Relative: 3 % (ref 0–5)
Lymphs Abs: 1.9 10*3/uL (ref 0.7–4.0)
MCH: 26.6 pg (ref 26.0–34.0)
MCV: 81.4 fL (ref 78.0–100.0)
Neutrophils Relative %: 55 % (ref 43–77)
Platelets: 218 10*3/uL (ref 150–400)
RBC: 4.88 MIL/uL (ref 3.87–5.11)
RDW: 14.8 % (ref 11.5–15.5)
WBC: 6 10*3/uL (ref 4.0–10.5)

## 2013-10-08 LAB — BASIC METABOLIC PANEL
Calcium: 9.2 mg/dL (ref 8.4–10.5)
GFR calc Af Amer: >90 mL/min (ref >90)
GFR calc non Af Amer: >90 mL/min (ref >90)
Potassium: 3.1 mEq/L — ABNORMAL LOW (ref 3.5–5.1)
Sodium: 140 mEq/L (ref 135–145)

## 2013-10-08 LAB — TROPONIN I: Troponin I: <0.3 ng/mL (ref <0.30)

## 2013-10-08 LAB — PRO B NATRIURETIC PEPTIDE: Pro B Natriuretic peptide (BNP): 308.7 pg/mL — ABNORMAL HIGH (ref 0–125)

## 2013-10-08 IMAGING — CR DG CHEST 2V
2 series · 2 of 2 positions shown · non-contrast
Comparison: [DATE].

CLINICAL DATA: Cough and wheezing.

EXAM:
CHEST  2 VIEW

[w chest pa]
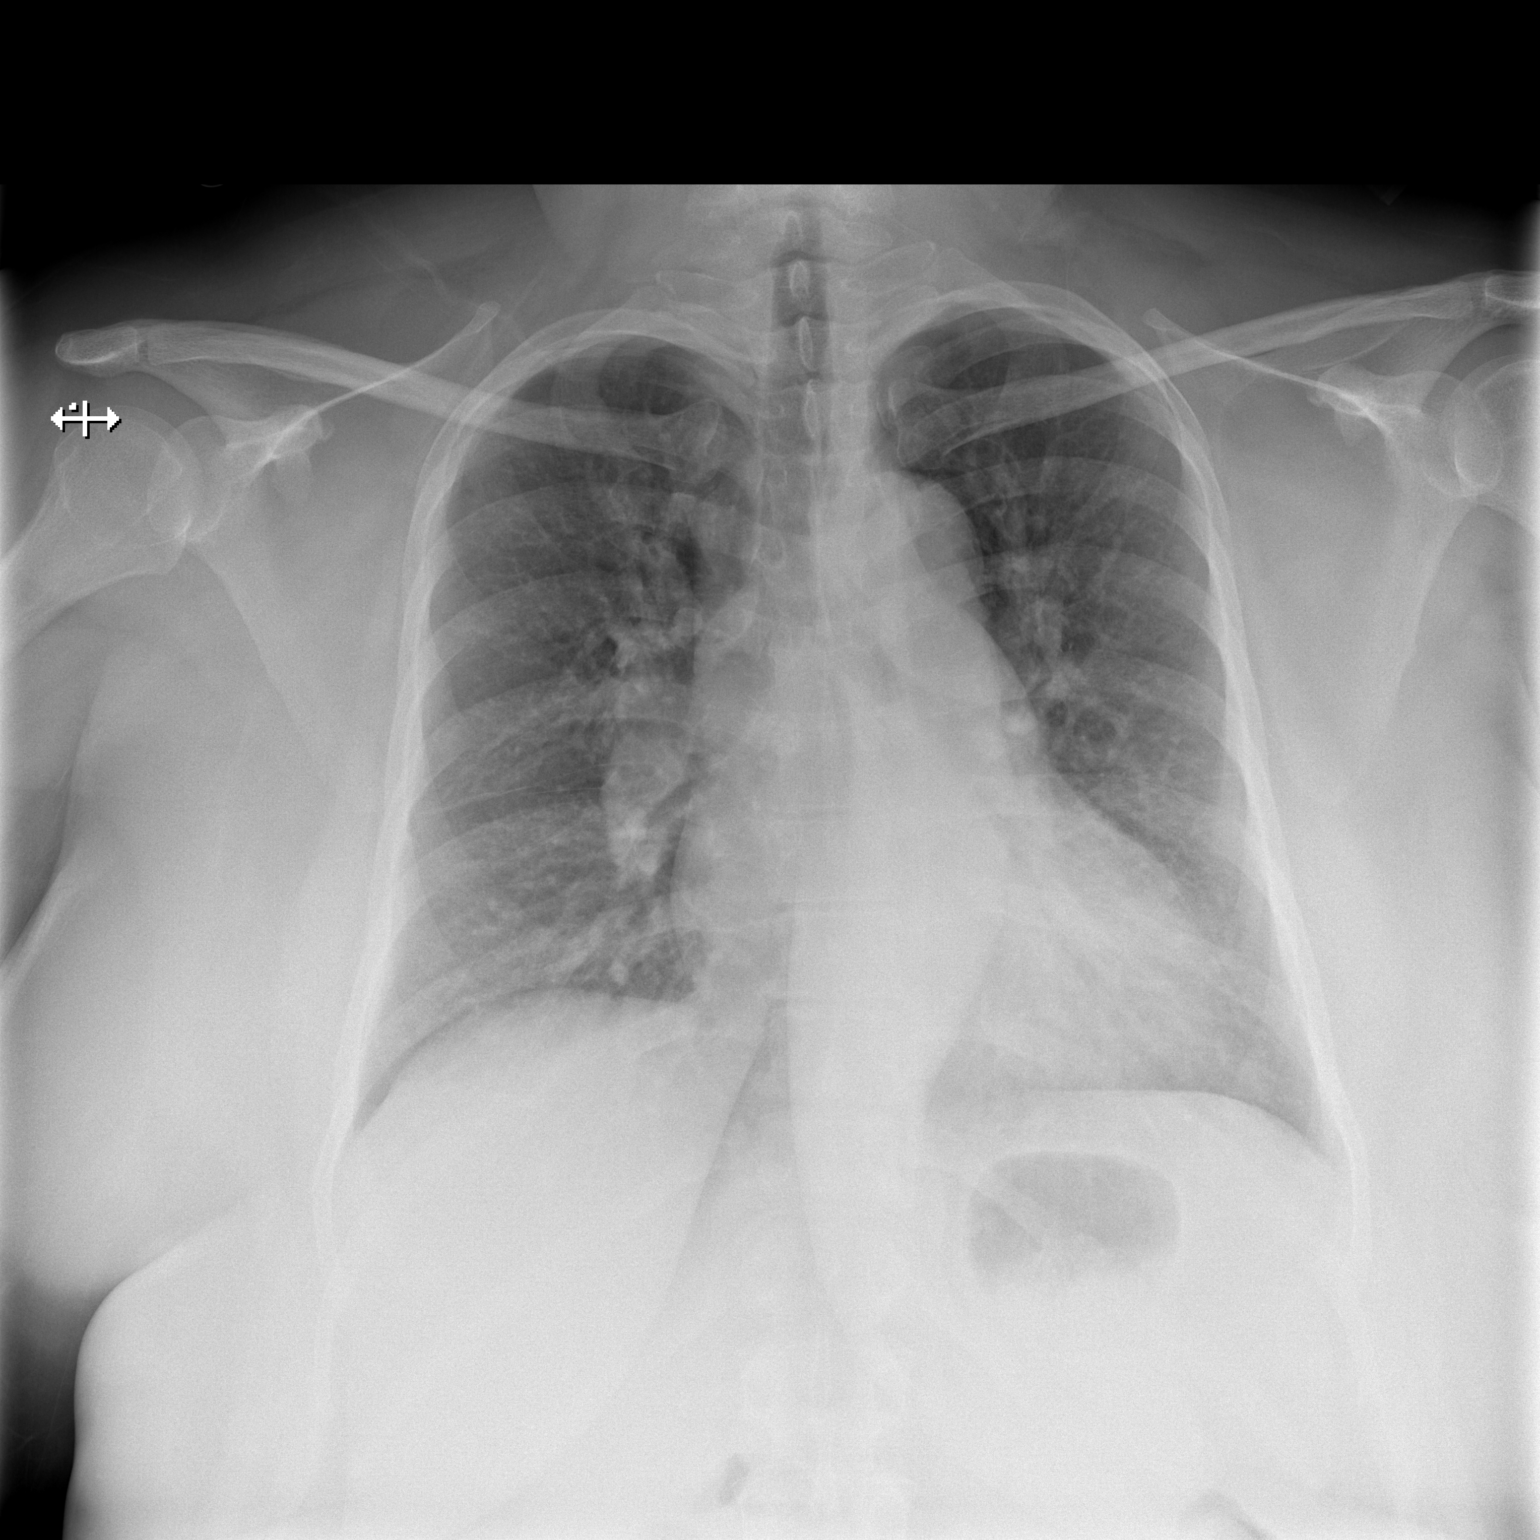

[w chest lat]
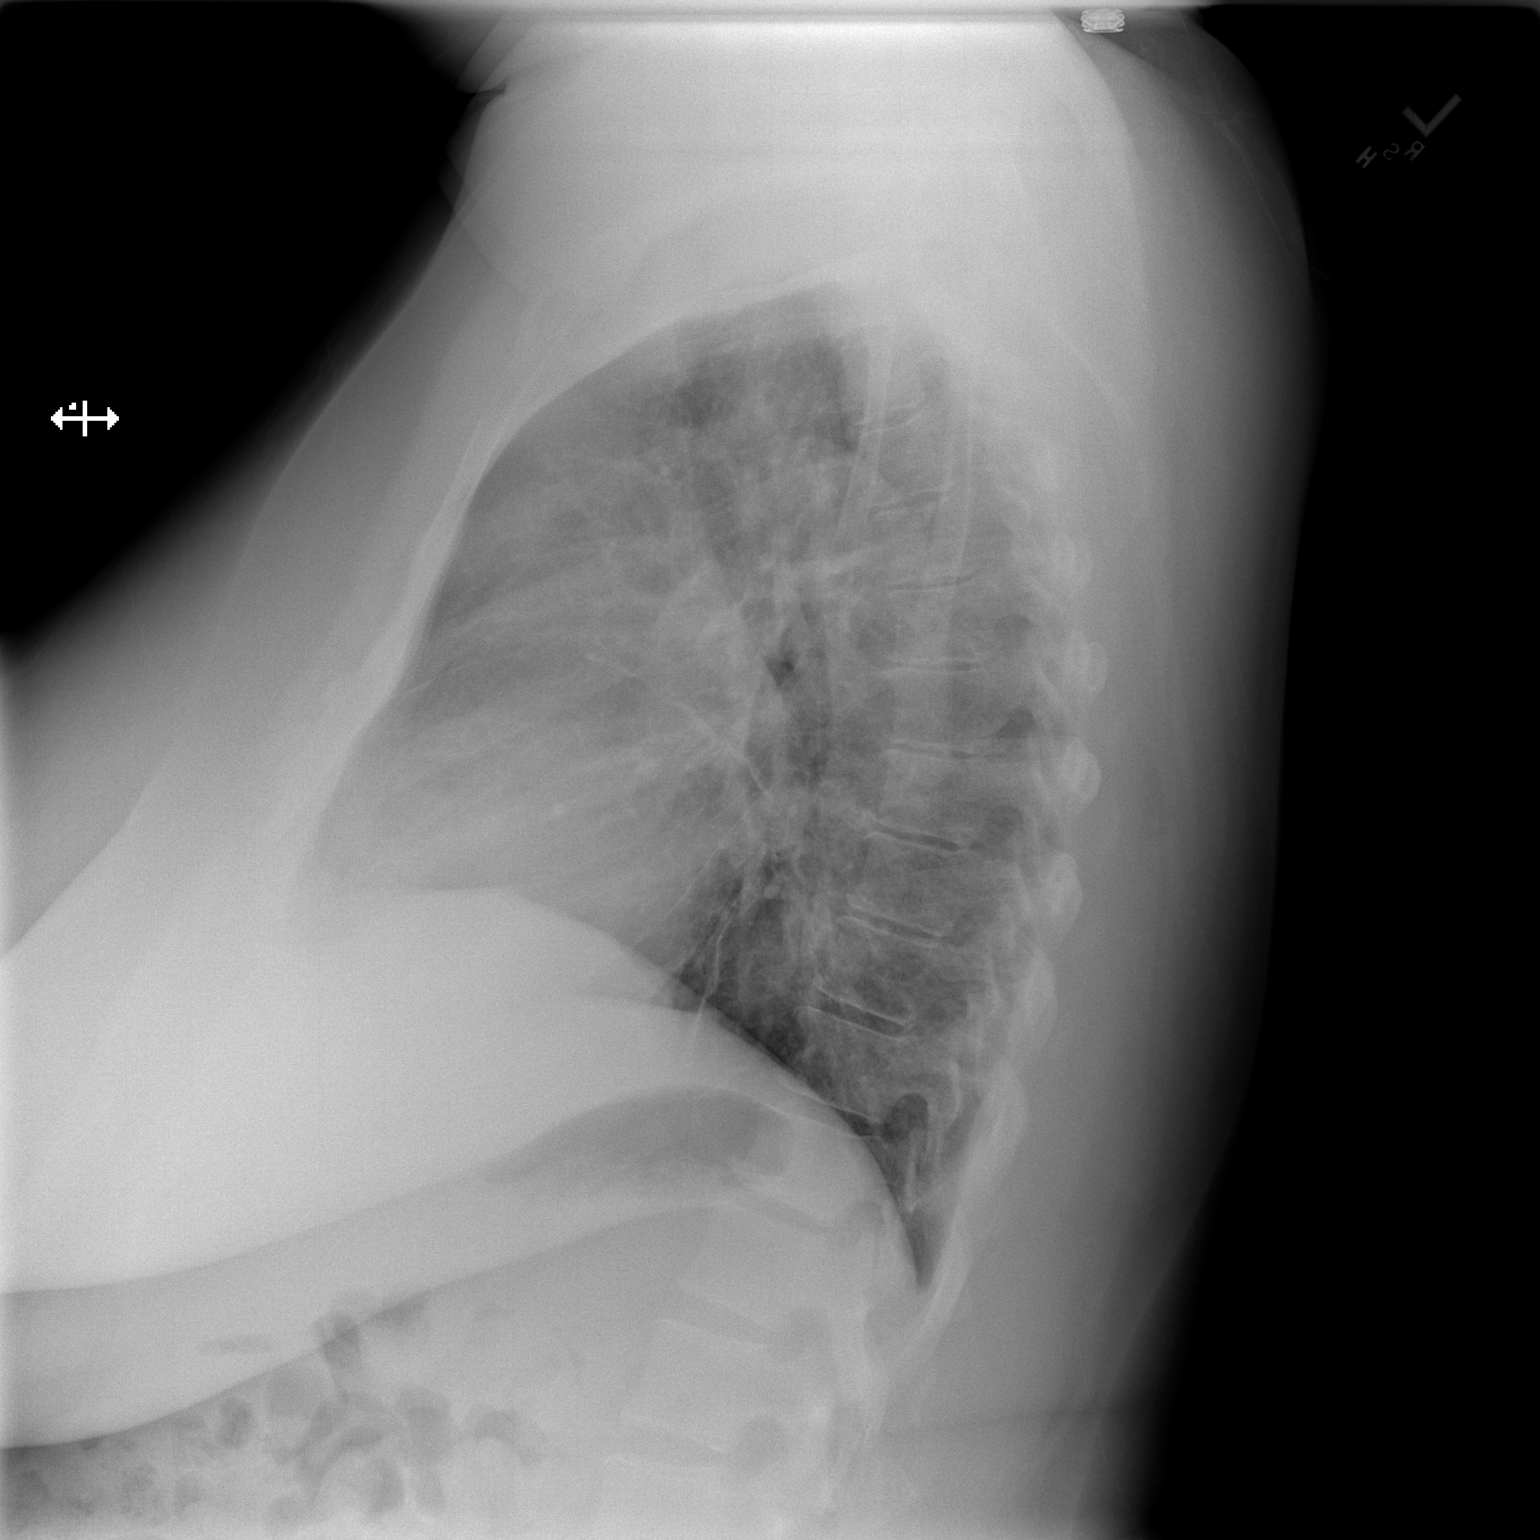

[2 of 2 positions shown; findings below may reference images not displayed]

FINDINGS: The lungs are adequately inflated. The interstitial markings are
increased bilaterally. There is no alveolar infiltrate The central
pulmonary vascularity is engorged. The cardiopericardial silhouette
remains mildly enlarged. There is tortuosity of the descending
thoracic aorta. There is no pleural effusion or pneumothorax.
IMPRESSION: The findings suggest congestive heart failure with mild interstitial
edema. There is no alveolar pneumonia nor evidence of a pleural
effusion. One cannot exclude coexisting acute bronchitis in the
appropriate clinical setting.

## 2013-10-08 MED ORDER — IPRATROPIUM BROMIDE 0.02 % IN SOLN
0.5000 mg | Freq: Once | RESPIRATORY_TRACT | Status: AC
  Administered 2013-10-08: 0.5 mg via RESPIRATORY_TRACT

## 2013-10-08 MED ORDER — ALBUTEROL SULFATE (5 MG/ML) 0.5% IN NEBU
INHALATION_SOLUTION | RESPIRATORY_TRACT | Status: AC
  Administered 2013-10-08: 5 mg via RESPIRATORY_TRACT
  Filled 2013-10-08: qty 1

## 2013-10-08 MED ORDER — HYDROCOD POLST-CHLORPHEN POLST 10-8 MG/5ML PO LQCR: 5.0000 mL | Freq: Two times a day (BID) | ORAL | Status: DC | PRN

## 2013-10-08 MED ORDER — PREDNISONE 20 MG PO TABS: 60.0000 mg | ORAL_TABLET | Freq: Every day | ORAL | Status: DC

## 2013-10-08 MED ORDER — ALBUTEROL SULFATE (5 MG/ML) 0.5% IN NEBU
5.0000 mg | INHALATION_SOLUTION | Freq: Once | RESPIRATORY_TRACT | Status: AC
  Administered 2013-10-08: 5 mg via RESPIRATORY_TRACT

## 2013-10-08 MED ORDER — PREDNISONE 20 MG PO TABS
60.0000 mg | ORAL_TABLET | Freq: Once | ORAL | Status: AC
  Administered 2013-10-08: 60 mg via ORAL
  Filled 2013-10-08: qty 3

## 2013-10-08 MED ORDER — ALBUTEROL SULFATE HFA 108 (90 BASE) MCG/ACT IN AERS
2.0000 | INHALATION_SPRAY | RESPIRATORY_TRACT | Status: DC | PRN
Start: 2013-10-08 — End: 2013-10-08
  Administered 2013-10-08: 2 via RESPIRATORY_TRACT
  Filled 2013-10-08: qty 6.7

## 2013-10-08 MED ORDER — IPRATROPIUM BROMIDE 0.02 % IN SOLN
RESPIRATORY_TRACT | Status: AC
  Administered 2013-10-08: 0.5 mg via RESPIRATORY_TRACT
  Filled 2013-10-08: qty 2.5

## 2013-10-08 MED ORDER — ONDANSETRON HCL 4 MG/2ML IJ SOLN
4.0000 mg | Freq: Once | INTRAMUSCULAR | Status: AC
  Administered 2013-10-08: 4 mg via INTRAVENOUS
  Filled 2013-10-08: qty 2

## 2013-10-08 NOTE — ED Notes (Signed)
Pt reports sob since last night. Has audible wheezing. Airway intact. Breathing treatment started. Hx of bronchitis.

## 2013-10-08 NOTE — ED Provider Notes (Signed)
CSN: [id number]     Arrival date & time 10/08/13  6213 History   First MD Initiated Contact with Patient 10/08/13 0848     Chief Complaint  Patient presents with  . Shortness of Breath   (Consider location/radiation/quality/duration/timing/severity/associated sxs/prior Treatment) Patient is a 47 y.o. female presenting with shortness of breath.  Shortness of Breath  Pt with history of HTN and PNA x 4, reports several days of dry cough. States she began having SOB and wheezing last night, but denies any fever or chest pain. Complaining some of 'tightness', worse with breathing. Denies any leg swelling. No N/V/D.   Past Medical History  Diagnosis Date  . HTN (hypertension)   . Pneumonia    Past Surgical History  Procedure Laterality Date  . Total vaginal hysterectomy  2007   Family History  Problem Relation Age of Onset  . Hypertension Mother   . Diabetes Mother    History  Substance Use Topics  . Smoking status: Current Every Day Smoker -- 0.50 packs/day for 10 years    Types: Cigarettes  . Smokeless tobacco: Not on file     Comment: Off and on.  . Alcohol Use: No   OB History   Grav Para Term Preterm Abortions TAB SAB Ect Mult Living                 Review of Systems  Respiratory: Positive for shortness of breath.    All other systems reviewed and are negative except as noted in HPI.   Allergies  Review of patient's allergies indicates no known allergies.  Home Medications   Current Outpatient Rx  Name  Route  Sig  Dispense  Refill  . budesonide-formoterol (SYMBICORT) 160-4.5 MCG/ACT inhaler   Inhalation   Inhale 2 puffs into the lungs once. Patient used once from family members perscription due to severe shortness of breath.  Patient does not use regularly.          BP 161/87  Pulse 84  Temp(Src) 98.9 F (37.2 C) (Oral)  Resp 20  Ht 5\' 3"  (1.6 m)  Wt 240 lb (108.863 kg)  BMI 42.52 kg/m2  SpO2 95%  LMP 12/19/2005 Physical Exam  Nursing note and  vitals reviewed. Constitutional: She is oriented to person, place, and time. She appears well-developed and well-nourished.  HENT:  Head: Normocephalic and atraumatic.  Eyes: EOM are normal. Pupils are equal, round, and reactive to light.  Neck: Normal range of motion. Neck supple.  Cardiovascular: Normal rate, normal heart sounds and intact distal pulses.   Pulmonary/Chest: Effort normal and breath sounds normal. No respiratory distress. She has no wheezes. She has no rales.  Upper airway volitional wheezing  Abdominal: Bowel sounds are normal. She exhibits no distension. There is no tenderness.  Musculoskeletal: Normal range of motion. She exhibits no edema and no tenderness.  Neurological: She is alert and oriented to person, place, and time. She has normal strength. No cranial nerve deficit or sensory deficit.  Skin: Skin is warm and dry. No rash noted.  Psychiatric: She has a normal mood and affect.    ED Course  Procedures (including critical care time) Labs Review Labs Reviewed  CBC WITH DIFFERENTIAL  BASIC METABOLIC PANEL  TROPONIN I  PRO B NATRIURETIC PEPTIDE   Imaging Review Dg Chest 2 View  10/08/2013   CLINICAL DATA:  Cough and wheezing.  EXAM: CHEST  2 VIEW  COMPARISON:  December 20, 2011.  FINDINGS: The lungs are adequately inflated. The  interstitial markings are increased bilaterally. There is no alveolar infiltrate The central pulmonary vascularity is engorged. The cardiopericardial silhouette remains mildly enlarged. There is tortuosity of the descending thoracic aorta. There is no pleural effusion or pneumothorax.  IMPRESSION: The findings suggest congestive heart failure with mild interstitial edema. There is no alveolar pneumonia nor evidence of a pleural effusion. One cannot exclude coexisting acute bronchitis in the appropriate clinical setting.   Electronically Signed   By: David  [place]   On: 10/08/2013 09:24    EKG Interpretation    Date/Time:  Friday  October 08 2013 09:40:25 EST Ventricular Rate:  96 PR Interval:  142 QRS Duration: 102 QT Interval:  384 QTC Calculation: 485 R Axis:   -9 Text Interpretation:  Sinus rhythm Borderline T abnormalities, anterior leads Borderline prolonged QT interval No old tracing to compare Confirmed by [facility]  MD, CHARLES [phone]) on 10/08/2013 9:54:39 AM            MDM   1. Bronchitis     CXR reviewed, vascular congestion of unknown etiology. Will check for lab evidence of ACS or CHF. Pt's symptoms not consistent with acute CHF.   11:34 AM Labs not concerning for ACS or CHF. Pt had a brief coughing spell which made her anxious and seemed to worsen her symptoms briefly. She was tachycardic for a few minutes but improved with rest. No hypoxia. She was placed on humidified O2 and feeling better. She has a bronchitis. HFA, prednisone, tussionex and rest at home.   Charles B. Bernette Mayers, MD 10/08/13 1136

## 2013-10-08 NOTE — ED Notes (Signed)
Patient transported to X-ray 

## 2013-10-08 NOTE — ED Notes (Signed)
Pt noted to have labored breathing with auditory wheezing; EDP and RT made aware

## 2013-10-08 NOTE — ED Notes (Addendum)
Pt had a "coughing spell", HR increased to 140/min, RR to 38/min, POX 91% RA & pt c/o SOB. Placed on aerosol mask per RT. Will monitor

## 2013-11-03 ENCOUNTER — Other Ambulatory Visit: Payer: Self-pay

## 2013-11-03 DIAGNOSIS — Z1231 Encounter for screening mammogram for malignant neoplasm of breast: Secondary | ICD-10-CM

## 2013-11-11 ENCOUNTER — Ambulatory Visit
Admission: RE | Admit: 2013-11-11 | Discharge: 2013-11-11 | Disposition: A | Discharge status: Home / Self Care | Payer: BC Managed Care – PPO | Source: Ambulatory Visit

## 2013-11-11 DIAGNOSIS — Z1231 Encounter for screening mammogram for malignant neoplasm of breast: Secondary | ICD-10-CM

## 2013-11-11 IMAGING — MG MM SCREEN MAMMOGRAM BILATERAL
5 series · 5 of 5 positions shown · non-contrast
Comparison: Previous exam(s).

CLINICAL DATA: Screening.

EXAM:
DIGITAL SCREENING BILATERAL MAMMOGRAM WITH CAD

[R CC]
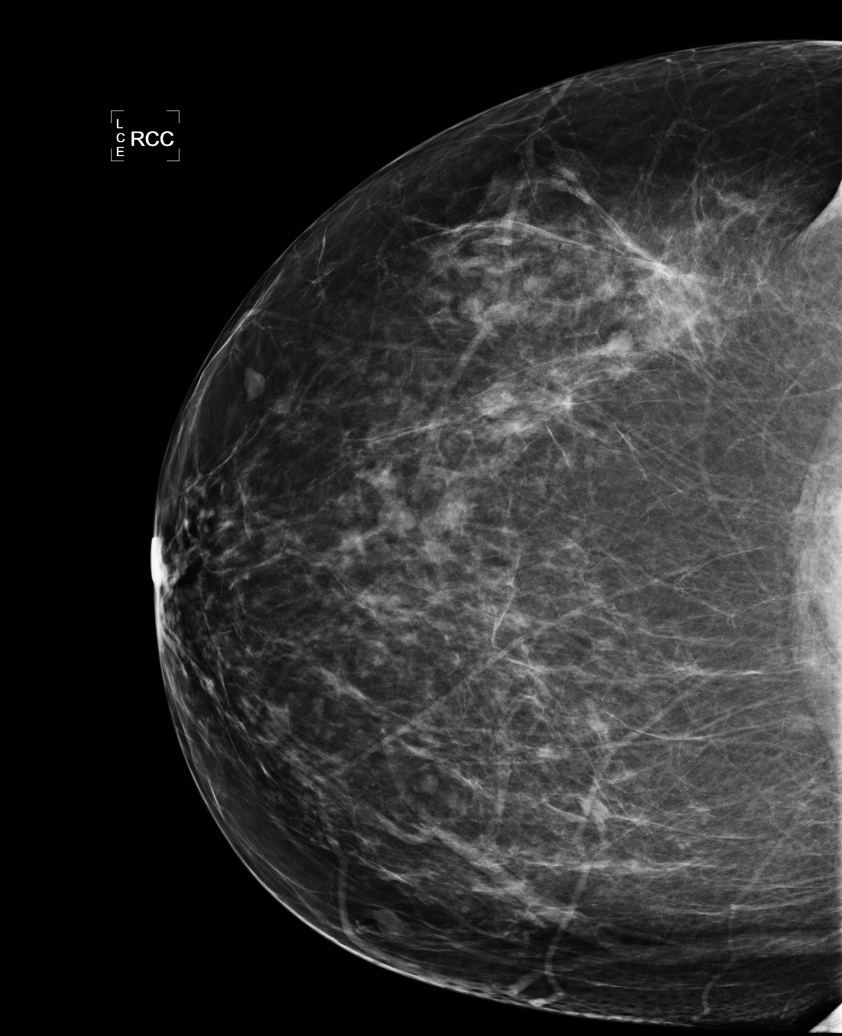

[L CC]
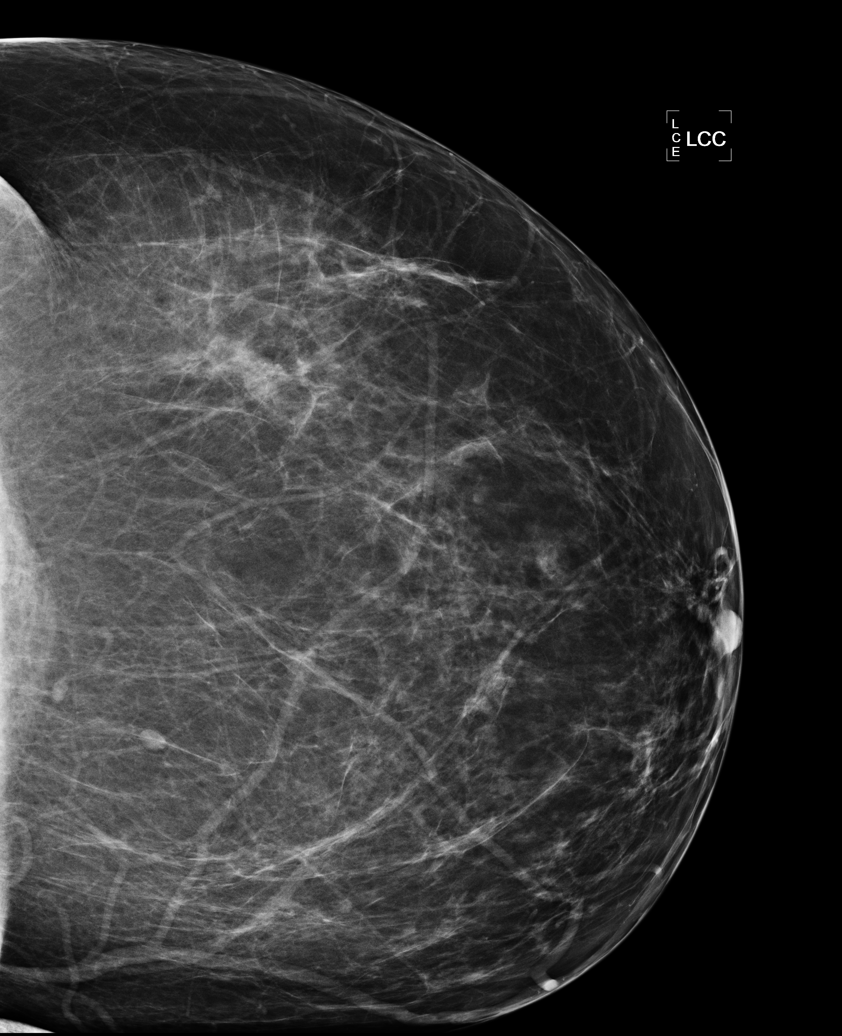

[L MLO (1 of 2)]
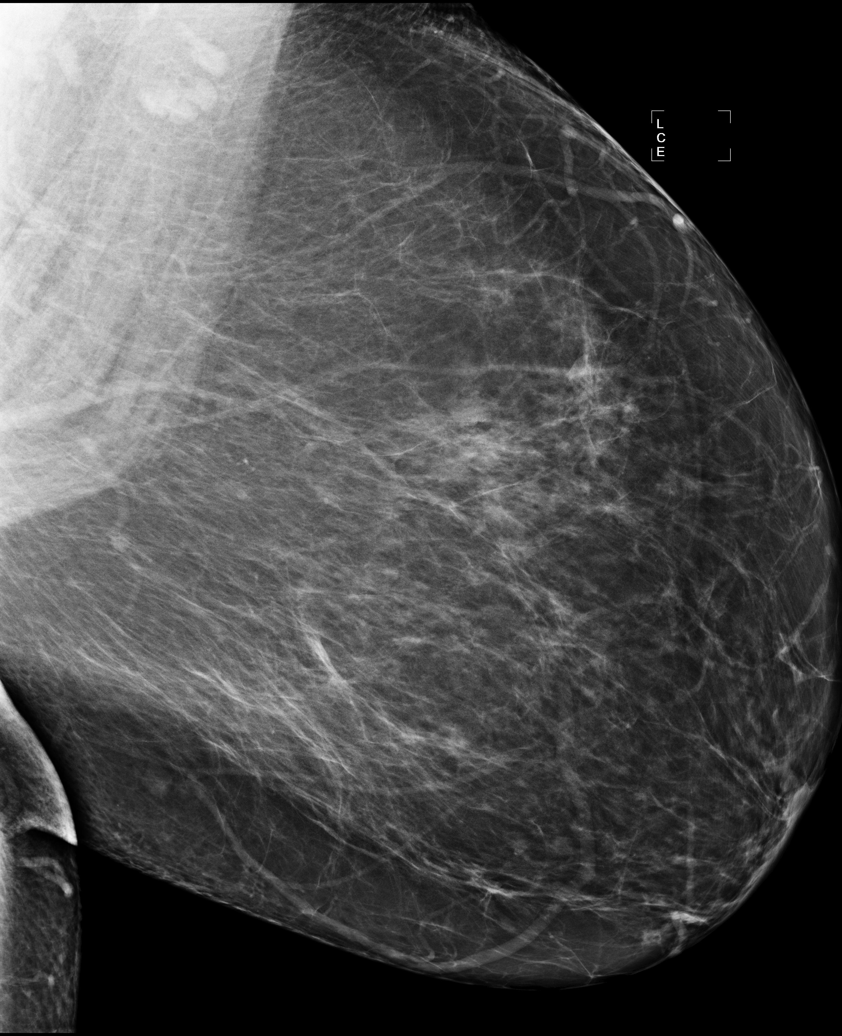

[L MLO (2 of 2)]
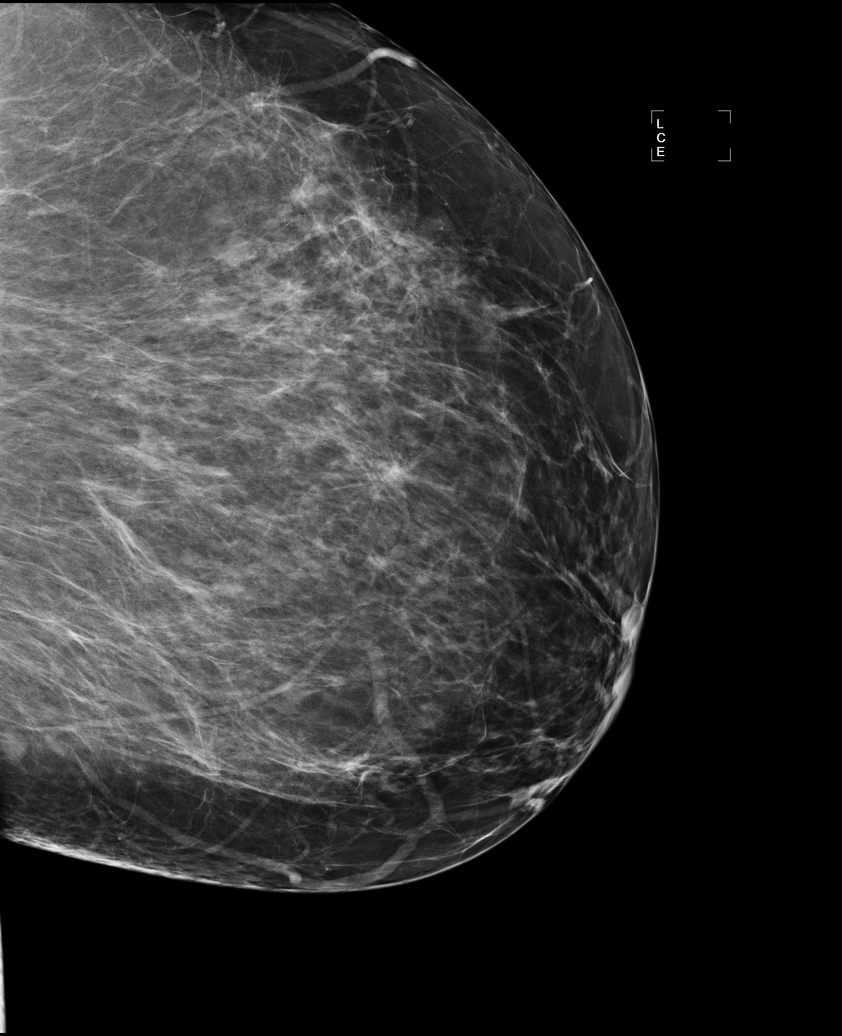

[R MLO]
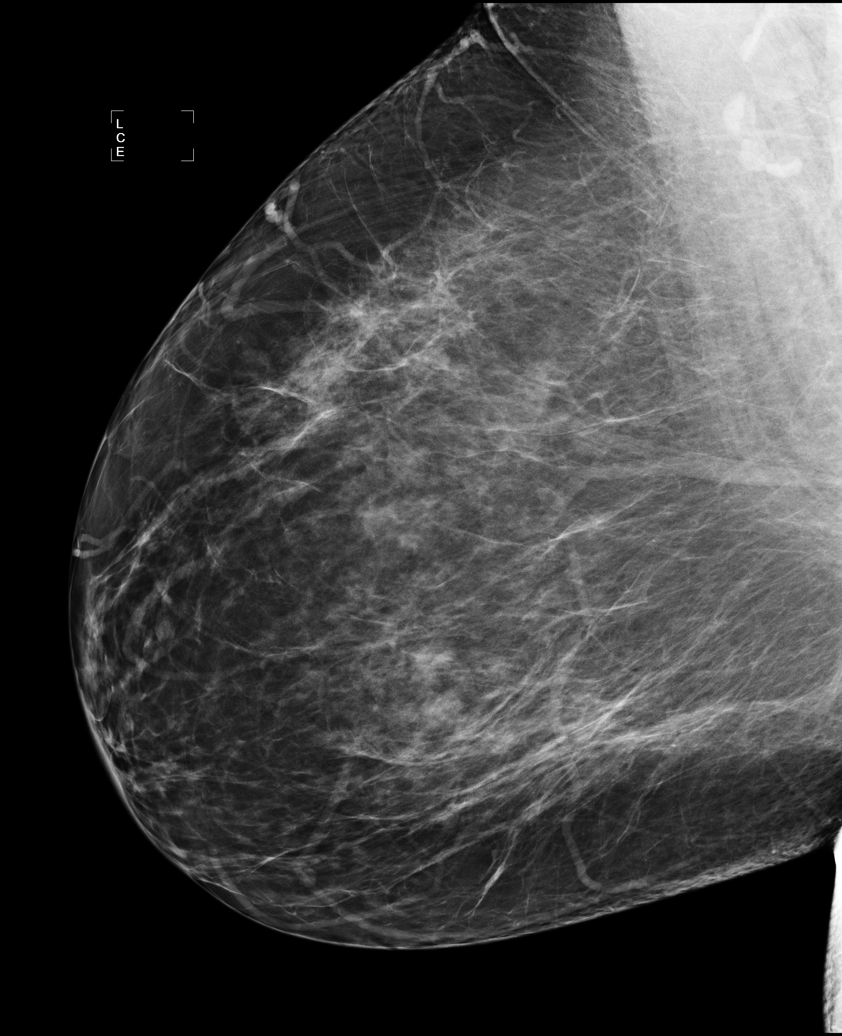

[5 of 5 positions shown; findings below may reference images not displayed]

ACR Breast Density Category b: There are scattered areas of
fibroglandular density.
FINDINGS: There are no findings suspicious for malignancy. Images were
processed with CAD.
IMPRESSION: No mammographic evidence of malignancy. A result letter of this
screening mammogram will be mailed directly to the patient.

RECOMMENDATION:
Screening mammogram in one year. (Code:[HN])

BI-RADS CATEGORY  1: Negative

## 2014-03-03 ENCOUNTER — Encounter (HOSPITAL_COMMUNITY): Payer: Self-pay | Admitting: Emergency Medicine

## 2014-03-03 ENCOUNTER — Emergency Department (HOSPITAL_COMMUNITY)
Admission: EM | Admit: 2014-03-03 | Discharge: 2014-03-03 | Disposition: A | Discharge status: Home / Self Care | Payer: Worker's Compensation | Attending: Emergency Medicine | Admitting: Emergency Medicine

## 2014-03-03 ENCOUNTER — Emergency Department (HOSPITAL_COMMUNITY): Payer: Worker's Compensation

## 2014-03-03 DIAGNOSIS — I10 Essential (primary) hypertension: Secondary | ICD-10-CM | POA: Insufficient documentation

## 2014-03-03 DIAGNOSIS — J45901 Unspecified asthma with (acute) exacerbation: Secondary | ICD-10-CM | POA: Insufficient documentation

## 2014-03-03 DIAGNOSIS — Z8701 Personal history of pneumonia (recurrent): Secondary | ICD-10-CM | POA: Insufficient documentation

## 2014-03-03 DIAGNOSIS — J9801 Acute bronchospasm: Secondary | ICD-10-CM

## 2014-03-03 DIAGNOSIS — Z77098 Contact with and (suspected) exposure to other hazardous, chiefly nonmedicinal, chemicals: Secondary | ICD-10-CM | POA: Insufficient documentation

## 2014-03-03 DIAGNOSIS — Z79899 Other long term (current) drug therapy: Secondary | ICD-10-CM | POA: Insufficient documentation

## 2014-03-03 DIAGNOSIS — J45909 Unspecified asthma, uncomplicated: Secondary | ICD-10-CM

## 2014-03-03 DIAGNOSIS — F17200 Nicotine dependence, unspecified, uncomplicated: Secondary | ICD-10-CM | POA: Insufficient documentation

## 2014-03-03 HISTORY — DX: Unspecified asthma, uncomplicated: J45.909

## 2014-03-03 IMAGING — CR DG CHEST 2V
2 series · 2 of 2 positions shown · non-contrast
Comparison: [DATE]

CLINICAL DATA: Inhalational injury, cough, shortness of breath

EXAM:
CHEST  2 VIEW

[w chest pa]
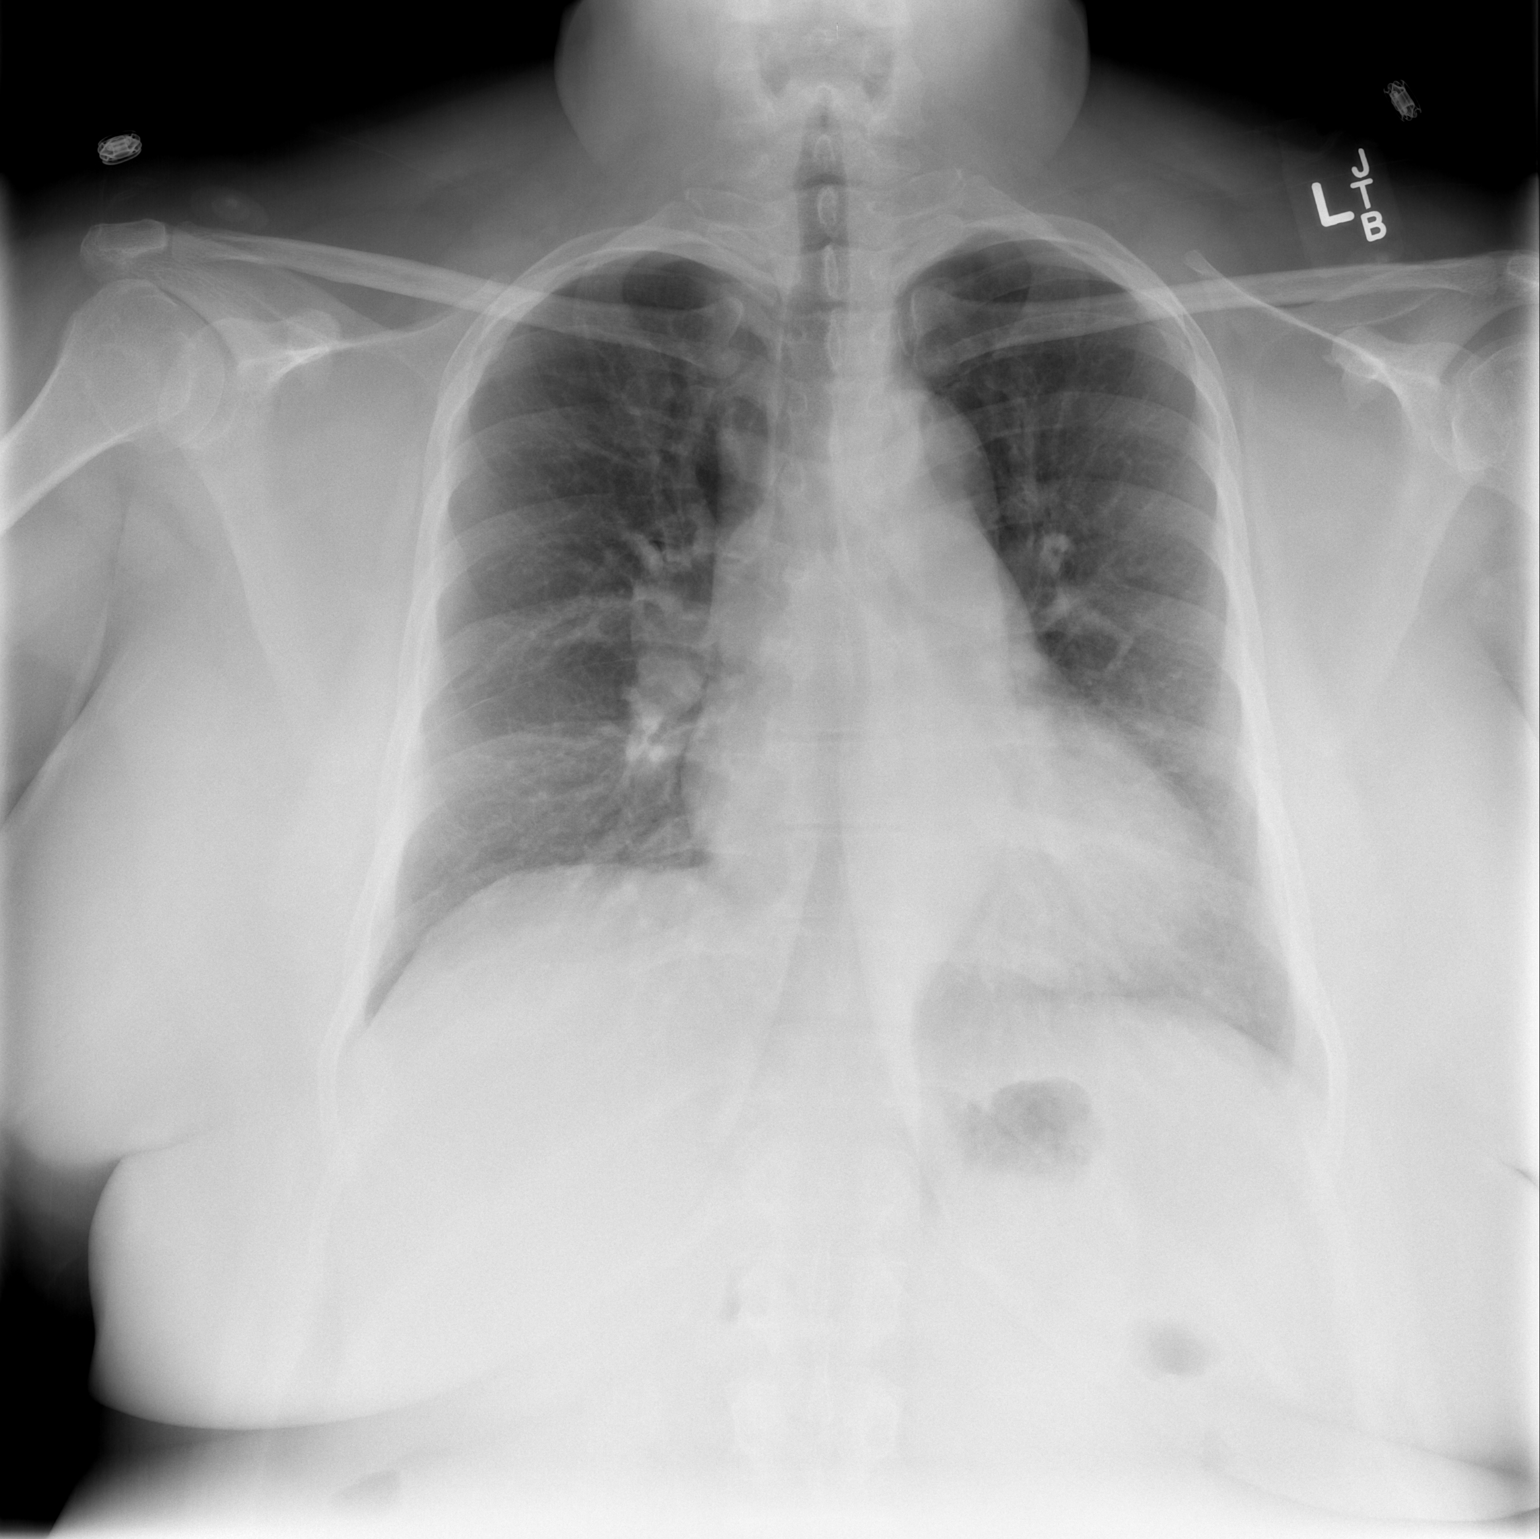

[w chest lat]
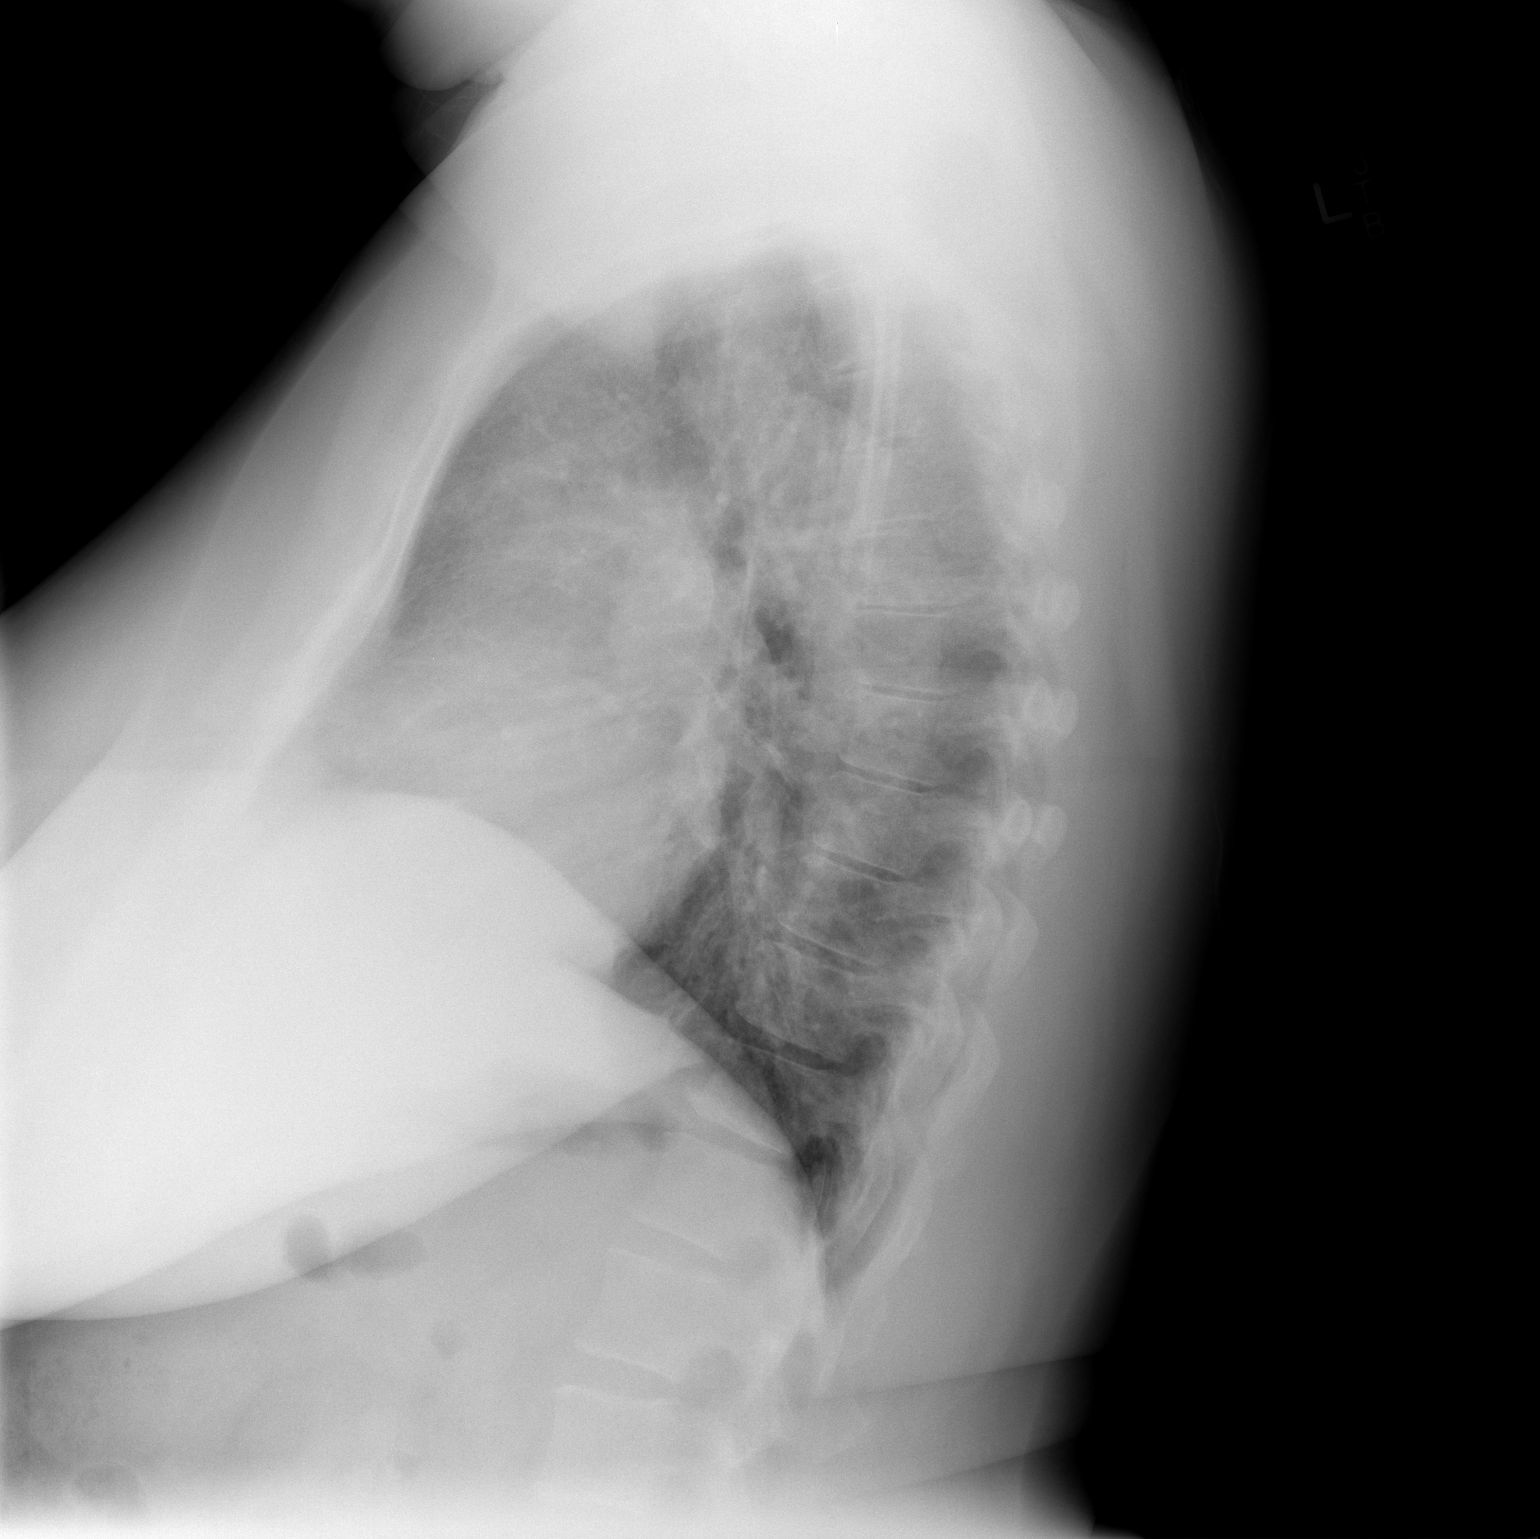

[2 of 2 positions shown; findings below may reference images not displayed]

FINDINGS: Stable cardiomegaly with central vascular congestion. No focal
pneumonia, collapse, edema, pneumonia, effusion or pneumothorax.
Trachea midline. Ectatic tortuous thoracic aorta noted. Overall
stable exam.
IMPRESSION: Stable cardiomegaly with vascular congestion. No current acute
process.

## 2014-03-03 MED ORDER — ALBUTEROL SULFATE (2.5 MG/3ML) 0.083% IN NEBU
INHALATION_SOLUTION | RESPIRATORY_TRACT | Status: AC
  Administered 2014-03-03: 5 mg via RESPIRATORY_TRACT
  Filled 2014-03-03: qty 6

## 2014-03-03 MED ORDER — ALBUTEROL SULFATE HFA 108 (90 BASE) MCG/ACT IN AERS: 2.0000 | INHALATION_SPRAY | Freq: Four times a day (QID) | RESPIRATORY_TRACT | Status: DC | PRN

## 2014-03-03 MED ORDER — IPRATROPIUM-ALBUTEROL 0.5-2.5 (3) MG/3ML IN SOLN
3.0000 mL | Freq: Once | RESPIRATORY_TRACT | Status: AC
  Administered 2014-03-03: 3 mL via RESPIRATORY_TRACT
  Filled 2014-03-03: qty 3

## 2014-03-03 MED ORDER — PREDNISONE 20 MG PO TABS: 60.0000 mg | ORAL_TABLET | Freq: Every day | ORAL | Status: DC

## 2014-03-03 MED ORDER — ALBUTEROL SULFATE (2.5 MG/3ML) 0.083% IN NEBU
5.0000 mg | INHALATION_SOLUTION | Freq: Once | RESPIRATORY_TRACT | Status: AC
  Administered 2014-03-03: 5 mg via RESPIRATORY_TRACT

## 2014-03-03 MED ORDER — PREDNISONE 20 MG PO TABS
60.0000 mg | ORAL_TABLET | Freq: Once | ORAL | Status: AC
  Administered 2014-03-03: 60 mg via ORAL
  Filled 2014-03-03: qty 3

## 2014-03-03 NOTE — ED Notes (Signed)
Pt to ED with audible wheezing and labored breathing.  Hx of intubation r/t asthma exacerbation.

## 2014-03-03 NOTE — Discharge Instructions (Signed)
Bronchospasm, Adult °A bronchospasm is when the tubes that carry air in and out of your lungs (airwarys) spasm or tighten. During a bronchospasm it is hard to breathe. This is because the airways get smaller. A bronchospasm can be triggered by: °· Allergies. These may be to animals, pollen, food, or mold. °· Infection. This is a common cause of bronchospasm. °· Exercise. °· Irritants. These include pollution, cigarette smoke, strong odors, aerosol sprays, and paint fumes. °· Weather changes. °· Stress. °· Being emotional. °HOME CARE  °· Always have a plan for getting help. Know when to call your doctor and local emergency services (911 in the [place] Know where you can get emergency care. °· Only take medicines as told by your doctor. °· If you were prescribed an inhaler or nebulizer machine, ask your doctor how to use it correctly. Always use a spacer with your inhaler if you were given one. °· Stay calm during an attack. Try to relax and breathe more slowly. °· Control your home environment: °· Change your heating and air conditioning filter at least once a month. °· Limit your use of fireplaces and wood stoves. °· Do not  smoke. Do not  allow smoking in your home. °· Avoid perfumes and fragrances. °· Get rid of pests (such as roaches and mice) and their droppings. °· Throw away plants if you see mold on them. °· Keep your house clean and dust free. °· Replace carpet with wood, tile, or vinyl flooring. Carpet can trap dander and dust. °· Use allergy-proof pillows, mattress covers, and box spring covers. °· Wash bed sheets and blankets every week in hot water. Dry them in a dryer. °· Use blankets that are made of polyester or cotton. °· Wash hands frequently. °GET HELP IF: °· You have muscle aches. °· You have chest pain. °· The thick spit you spit or cough up (sputum) changes from clear or white to yellow, green, gray, or bloody. °· The thick spit you spit or cough up gets thicker. °· There are problems that may be  related to the medicine you are given such as: °· A rash. °· Itching. °· Swelling. °· Trouble breathing. °GET HELP RIGHT AWAY IF: °· You feel you cannot breathe or catch your breath. °· You cannot stop coughing. °· Your treatment is not helping you breathe better. °MAKE SURE YOU:  °· Understand these instructions. °· Will watch your condition. °· Will get help right away if you are not doing well or get worse. °Document Released: 08/11/2009 Document Revised: 06/16/2013 Document Reviewed: 04/06/2013 °Exit[employer]® Patient Information ©2014 [employer]. ° °

## 2014-03-03 NOTE — ED Notes (Signed)
Pt respirations easy non labored

## 2014-03-03 NOTE — ED Provider Notes (Signed)
CSN: [id number]     Arrival date & time 03/03/14  1028 History   First MD Initiated Contact with Patient 03/03/14 1132     Chief Complaint  Patient presents with  . Shortness of Breath    asthma     (Consider location/radiation/quality/duration/timing/severity/associated sxs/prior Treatment) HPI Comments: Patient presents to the ER for evaluation of difficulty breathing. Patient has a history of asthma and pneumonia. She reports that she has had some cold symptoms in the last couple of days, but upon arriving at work today she was exposed to some form of cleaner that he used to clean the floors. She reports that she immediately started having wheezing and shortness of breath. She has been given a breathing treatment already, has had some improvement.  Patient is a 48 y.o. female presenting with shortness of breath.  Shortness of Breath Associated symptoms: cough and wheezing     Past Medical History  Diagnosis Date  . HTN (hypertension)   . Pneumonia   . Asthma    Past Surgical History  Procedure Laterality Date  . Total vaginal hysterectomy  2007   Family History  Problem Relation Age of Onset  . Hypertension Mother   . Diabetes Mother    History  Substance Use Topics  . Smoking status: Current Every Day Smoker -- 0.25 packs/day for 10 years    Types: Cigarettes  . Smokeless tobacco: Not on file     Comment: Off and on.  . Alcohol Use: No   OB History   Grav Para Term Preterm Abortions TAB SAB Ect Mult Living                 Review of Systems  Respiratory: Positive for cough, shortness of breath and wheezing.   All other systems reviewed and are negative.     Allergies  Review of patient's allergies indicates no known allergies.  Home Medications   Prior to Admission medications   Medication Sig Start Date End Date Taking? Authorizing Provider  lisinopril-hydrochlorothiazide (PRINZIDE,ZESTORETIC) 10-12.5 MG per tablet Take 1 tablet by mouth daily. 02/28/14   Yes Historical Provider, MD  Vitamin D, Ergocalciferol, (DRISDOL) 50000 UNITS CAPS capsule Take 50,000 Units by mouth 2 (two) times a week. Mondays and Fridays 02/27/14  Yes Historical Provider, MD  QSYMIA 7.5-46 MG CP24 Take 1 tablet by mouth daily. 03/02/14   Historical Provider, MD   BP 110/61  Pulse 63  Temp(Src) 97.8 F (36.6 C) (Oral)  Resp 22  SpO2 99%  LMP 12/19/2005 Physical Exam  Constitutional: She is oriented to person, place, and time. She appears well-developed and well-nourished. No distress.  HENT:  Head: Normocephalic and atraumatic.  Right Ear: Hearing normal.  Left Ear: Hearing normal.  Nose: Nose normal.  Mouth/Throat: Oropharynx is clear and moist and mucous membranes are normal.  Eyes: Conjunctivae and EOM are normal. Pupils are equal, round, and reactive to light.  Neck: Normal range of motion. Neck supple.  Cardiovascular: Regular rhythm, S1 normal and S2 normal.  Exam reveals no gallop and no friction rub.   No murmur heard. Pulmonary/Chest: Effort normal. No respiratory distress. She has wheezes. She exhibits no tenderness.  Abdominal: Soft. Normal appearance and bowel sounds are normal. There is no hepatosplenomegaly. There is no tenderness. There is no rebound, no guarding, no tenderness at McBurney's point and negative Murphy's sign. No hernia.  Musculoskeletal: Normal range of motion.  Neurological: She is alert and oriented to person, place, and time. She has normal  strength. No cranial nerve deficit or sensory deficit. Coordination normal. GCS eye subscore is 4. GCS verbal subscore is 5. GCS motor subscore is 6.  Skin: Skin is warm, dry and intact. No rash noted. No cyanosis.  Psychiatric: She has a normal mood and affect. Her speech is normal and behavior is normal. Thought content normal.    ED Course  Procedures (including critical care time) Labs Review Labs Reviewed - No data to display  Imaging Review Dg Chest 2 View  03/03/2014   CLINICAL DATA:   Inhalational injury, cough, shortness of breath  EXAM: CHEST  2 VIEW  COMPARISON:  10/08/2013  FINDINGS: Stable cardiomegaly with central vascular congestion. No focal pneumonia, collapse, edema, pneumonia, effusion or pneumothorax. Trachea midline. Ectatic tortuous thoracic aorta noted. Overall stable exam.  IMPRESSION: Stable cardiomegaly with vascular congestion. No current acute process.   Electronically Signed   By: Ruel Favorsrevor  Shick M.D.   On: 03/03/2014 12:08     EKG Interpretation None      MDM   Final diagnoses:  None   Patient presents to ER for evaluation of acute bronchospasm secondary to exposure to a cleaner. She had mild wheezing upon arrival. There was some evidence of upper airway involvement as well, as she did have hoarseness to her voice. She is breathing comfortably now. Oxygen saturation was low 90 to use upon arrival, now is 98%. She'll be discharged with prednisone and continued bronchodilator therapy. She has had significant bronchospastic exacerbations in the past, she was counseled to return immediately if her symptoms worsen.    Gilda Creasehristopher J. Twanda Stakes, MD 03/03/14 [phone]

## 2016-04-26 DIAGNOSIS — J95830 Postprocedural hemorrhage and hematoma of a respiratory system organ or structure following a respiratory system procedure: Secondary | ICD-10-CM | POA: Insufficient documentation

## 2016-06-07 DIAGNOSIS — S82202A Unspecified fracture of shaft of left tibia, initial encounter for closed fracture: Secondary | ICD-10-CM | POA: Insufficient documentation

## 2017-12-03 ENCOUNTER — Other Ambulatory Visit: Payer: Self-pay | Admitting: Internal Medicine

## 2017-12-03 DIAGNOSIS — Z139 Encounter for screening, unspecified: Secondary | ICD-10-CM

## 2017-12-22 ENCOUNTER — Ambulatory Visit
Admission: RE | Admit: 2017-12-22 | Discharge: 2017-12-22 | Disposition: A | Discharge status: Home / Self Care | Payer: BC Managed Care – PPO | Source: Ambulatory Visit | Attending: Internal Medicine | Admitting: Internal Medicine

## 2017-12-22 DIAGNOSIS — Z139 Encounter for screening, unspecified: Secondary | ICD-10-CM

## 2018-04-02 ENCOUNTER — Ambulatory Visit (INDEPENDENT_AMBULATORY_CARE_PROVIDER_SITE_OTHER): Payer: BC Managed Care – PPO | Admitting: Internal Medicine

## 2018-04-02 ENCOUNTER — Encounter: Payer: Self-pay | Admitting: Internal Medicine

## 2018-04-02 VITALS — BP 170/110 | HR 86 | Ht 63.0 in | Wt 256.0 lb

## 2018-04-02 DIAGNOSIS — I10 Essential (primary) hypertension: Secondary | ICD-10-CM

## 2018-04-02 DIAGNOSIS — J45991 Cough variant asthma: Secondary | ICD-10-CM

## 2018-04-02 MED ORDER — OLMESARTAN MEDOXOMIL-HCTZ 40-25 MG PO TABS: 1.0000 | ORAL_TABLET | Freq: Every day | ORAL | 11 refills | Status: DC

## 2018-04-02 MED ORDER — BUDESONIDE-FORMOTEROL FUMARATE 80-4.5 MCG/ACT IN AERO: 2.0000 | INHALATION_SPRAY | Freq: Two times a day (BID) | RESPIRATORY_TRACT | 11 refills | Status: DC

## 2018-04-02 MED ORDER — FAMOTIDINE 20 MG PO TABS: ORAL_TABLET | ORAL | 11 refills | Status: DC

## 2018-04-02 NOTE — Progress Notes (Signed)
Subjective:     Patient ID: Jessica Snow, female   DOB: 1966-05-17,     MRN: [id number]  HPI  25 yobf quit smoking 01/2018 healthy as child/ teenager ran track in HS very competitive no problem with IUP with baseline wt 140 with progressive wt gain since her late 30's   complicated HBP on  lisinopril then recurrent bronchtis since age 52 which = 2012 referred to pulmonary clinic 04/02/2018 by Dr   Jaci Standard PA with cough and orthopnea    04/02/2018 1st [facility] office visit/ Leonor Darnell  Re cough on ACEi  Chief Complaint  Patient presents with  . Pulmonary Consult    Referred by Beatrix Fetters, PA.  Pt c/o SOB for the past month.  She states she sometimes gets winded just walking short distances.  She occ has SOB when she lies down. She also c/o cough with clear sputum- worse at night and wakes her up.  She uses her albuterol inhaler and neb both 1-2 x daily on average.   cough wax and wanes x 6 months 24/7 with freq throat clearing and noct exacerbation  symbicort 80 no better  pred no better  was able to treadmill x two years prior to OV  Now doe x more than room to room and immediate in supine position    NO obvious day to day or daytime variability or assoc truly excess/ purulent sputum or mucus plugs or hemoptysis or cp or chest tightness, subjective wheeze or overt sinus or hb symptoms. No unusual exposure hx or h/o childhood pna/ asthma or knowledge of premature birth.    Also denies any obvious fluctuation of symptoms with weather or environmental changes or other aggravating or alleviating factors except as outlined above   Current Allergies, Complete Past Medical History, Past Surgical History, Family History, and Social History were reviewed in [employer] record.  ROS  The following are not active complaints unless bolded Hoarseness, sore throat, dysphagia, dental problems, itching, sneezing,  nasal congestion or discharge of excess mucus or  purulent secretions, ear ache,   fever, chills, sweats, unintended wt loss or wt gain, classically pleuritic or exertional cp,  orthopnea pnd or leg swelling, presyncope, palpitations, abdominal pain, anorexia, nausea, vomiting, diarrhea  or change in bowel habits or change in bladder habits, change in stools or change in urine, dysuria, hematuria,  rash, arthralgias, visual complaints, headache, numbness, weakness or ataxia or problems with walking or coordination,  change in mood/affect or memory.        Current Meds also symbicort   Medication Sig  . albuterol (PROVENTIL HFA;VENTOLIN HFA) 108 (90 BASE) MCG/ACT inhaler Inhale 2 puffs into the lungs every 6 (six) hours as needed for wheezing or shortness of breath.  Marland Kitchen albuterol (PROVENTIL) (2.5 MG/3ML) 0.083% nebulizer solution 1 vial in neb every 4 hours as needed  . lansoprazole (PREVACID) 30 MG capsule 1 capsule daily.  . Liraglutide -Weight Management (SAXENDA) 18 MG/3ML SOPN 1 injection daily  . lisinopril-hydrochlorothiazide (PRINZIDE,ZESTORETIC) 20-25 MG per tablet Take 1 tablet by mouth daily.  . montelukast (SINGULAIR) 10 MG tablet Take 1 tablet by mouth at bedtime.  . [DISCONTINUED] Vitamin D, Ergocalciferol, (DRISDOL) 50000 UNITS CAPS capsule Take 50,000 Units by mouth 2 (two) times a week. Mondays and Fridays       Review of Systems     Objective:   Physical Exam Obese bf with classic upper airway harsh cough   Wt Readings from Last 3  Encounters:  04/02/18 256 lb (116.1 kg)  10/08/13 240 lb (108.9 kg)  01/20/12 231 lb (104.8 kg)     Vital signs reviewed - Note on arrival 02 sats  96% on RA and bp 170/110     HEENT: nl dentition, turbinates bilaterally, and oropharynx. Nl external ear canals without cough reflex   NECK :  without JVD/Nodes/TM/ nl carotid upstrokes bilaterally   LUNGS: no acc muscle use,  Nl contour chest which is clear to A and P bilaterally without cough on insp or exp maneuvers   CV:  RRR  no s3  or murmur or increase in P2, and no edema   ABD:  Obese soft and nontender with limited  inspiratory excursion in the supine position. No bruits or organomegaly appreciated, bowel sounds nl  MS:  Nl gait/ ext warm without deformities, calf tenderness, cyanosis or clubbing No obvious joint restrictions   SKIN: warm and dry without lesions    NEURO:  alert, approp, nl sensorium with  no motor or cerebellar deficits apparent.     I personally reviewed images and agree with radiology impression as follows:  pCXR:   03/11/18  wnl       Assessment:

## 2018-04-02 NOTE — Patient Instructions (Addendum)
Stop lisinopril and start benicar 40-25 one daily and your cough should improve  Continue Prevacid 30 mg  Take 30-60 min before first meal of the day and add add pepcid 20 mg one hour before bedtime    GERD (REFLUX)  is an extremely common cause of respiratory symptoms just like yours , many times with no obvious heartburn at all.    It can be treated with medication, but also with lifestyle changes including elevation of the head of your bed (ideally with 6 inch  bed blocks),  Smoking cessation, avoidance of late meals, excessive alcohol, and avoid fatty foods, chocolate, peppermint, colas, red wine, and acidic juices such as orange juice.  NO MINT OR MENTHOL PRODUCTS SO NO COUGH DROPS   USE SUGARLESS CANDY INSTEAD (Jolley ranchers or Stover's or Life Savers) or even ice chips will also do - the key is to swallow to prevent all throat clearing. NO OIL BASED VITAMINS - use powdered substitutes.    Please schedule a follow up office visit in 6 weeks, call sooner if needed with all medications /inhalers/ solutions in hand so we can verify exactly what you are taking. This includes all medications from all doctors and over the counters and PFT's on return    Add: have her come back in 1 week for bp check and bmet to see Demetrius CharityBrian or Sarah NP

## 2018-04-03 ENCOUNTER — Telehealth: Payer: Self-pay | Admitting: *Deleted

## 2018-04-03 ENCOUNTER — Encounter: Payer: Self-pay | Admitting: Internal Medicine

## 2018-04-03 NOTE — Telephone Encounter (Signed)
-----   Message from Nyoka CowdenMichael B Wert, MD sent at 04/03/2018  5:54 AM EDT ----- Add: have her come back in 1 week for bp check and bmet to see Demetrius CharityBrian or Sarah NP as this dose of benicar may be a bit high

## 2018-04-03 NOTE — Assessment & Plan Note (Addendum)
04/02/2018  After extensive coaching inhaler device  effectiveness =    75% from a baseline of 0 > continue symb 80 2bid - 04/02/2018 try off acei.    The most common causes of chronic cough in immunocompetent adults include the following: upper airway cough syndrome (UACS), previously referred to as postnasal drip syndrome (PNDS), which is caused by variety of rhinosinus conditions; (2) asthma; (3) GERD; (4) chronic bronchitis from cigarette smoking or other inhaled environmental irritants; (5) nonasthmatic eosinophilic bronchitis; and (6) bronchiectasis.   These conditions, singly or in combination, have accounted for up to 94% of the causes of chronic cough in prospective studies.   Other conditions have constituted no >6% of the causes in prospective studies These have included bronchogenic carcinoma, chronic interstitial pneumonia, sarcoidosis, left ventricular failure, ACEI-induced cough, and aspiration from a condition associated with pharyngeal dysfunction.    Chronic cough is often simultaneously caused by more than one condition. A single cause has been found from 38 to 82% of the time, multiple causes from 18 to 62%. Multiply caused cough has been the result of three diseases up to 42% of the time.    Most likely this is either cough variant asthma or uacs from acei/gerd or combination of both which can be a challenge as meds for asthma can make cough worse.  Upper airway cough syndrome (previously labeled PNDS),  is so named because it's frequently impossible to sort out how much is  CR/sinusitis with freq throat clearing (which can be related to primary GERD)   vs  causing  secondary (" extra esophageal")  GERD from wide swings in gastric pressure that occur with throat clearing, often  promoting self use of mint and menthol lozenges that reduce the lower esophageal sphincter tone and exacerbate the problem further in a cyclical fashion.   These are the same pts (now being labeled as having  "irritable larynx syndrome" by some cough centers) who not infrequently have a history of having failed to tolerate ace inhibitors,  dry powder inhalers or biphosphonates or report having atypical/extraesophageal reflux symptoms that don't respond to standard doses of PPI  and are easily confused as having aecopd or asthma flares by even experienced allergists/ pulmonologists (myself included).    rec trial of low dose symb = 80 2 bid with better hfa effort and rx as uacs with max rx for gerd while off acei x 6 weeks then return for pfts  Total time devoted to counseling  > 50 % of initial 60 min office visit:  review case with pt/ discussion of options/alternatives/ personally creating written customized instructions  in presence of pt  then going over those specific  Instructions directly with the pt including how to use all of the meds but in particular covering each new medication in detail and the difference between the maintenance= "automatic" meds and the prns using an action plan format for the latter (If this problem/symptom => do that organization reading Left to right).  Please see AVS from this visit for a full list of these instructions which I personally wrote for this pt and  are unique to this visit.   See device teaching which extended face to face time for this visit

## 2018-04-03 NOTE — Assessment & Plan Note (Addendum)
In the best review of chronic cough to date ( NEJM 2016 375 [phone]) ,  ACEi are now felt to cause cough in up to  20% of pts which is a 4 fold increase from previous reports and does not include the variety of non-specific complaints we see in pulmonary clinic in pts on ACEi but previously attributed to another dx like  Copd/asthma and  include PNDS, throat and chest congestion, "bronchitis", unexplained dyspnea and noct "strangling" sensations, and hoarseness, but also  atypical /refractory GERD symptoms like dysphagia and "bad heartburn"   The only way I know  to prove this is not an "ACEi Case" is a trial off ACEi x a minimum of 6 weeks then regroup.   Try benicar 40-25 since bp so high on lisinopril 20-25   x 6 weeks then regroup

## 2018-04-03 NOTE — Telephone Encounter (Signed)
LMTCB

## 2018-04-03 NOTE — Assessment & Plan Note (Signed)
Body mass index is 45.35 kg/m.  -  trending up  No results found for: TSH   Contributing to gerd risk/ doe/reviewed the need and the process to achieve and maintain neg calorie balance > defer f/u primary care including intermittently monitoring thyroid status

## 2018-04-03 NOTE — Telephone Encounter (Signed)
Patient returned call, scheduled patient with Arlys JohnBrian on 04/10/18 @ 2:30pm -pt needs nothing further -pr

## 2018-04-08 ENCOUNTER — Other Ambulatory Visit: Payer: Self-pay | Admitting: Internal Medicine

## 2018-04-08 MED ORDER — LANSOPRAZOLE 30 MG PO CPDR: 30.0000 mg | DELAYED_RELEASE_CAPSULE | Freq: Every day | ORAL | 11 refills | Status: DC

## 2018-04-09 NOTE — Progress Notes (Signed)
@Patient  ID: Jessica Snow, female    DOB: 08-22-1966, 52 y.o.   MRN: [id number]  Chief Complaint  Patient presents with  . Follow-up    cough is better. Minimal Taking benicar daily.     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 Pulmonary Consult    Referred by Beatrix Fetters, PA.  Pt c/o SOB for the past month.  She states she sometimes gets winded just walking short distances.  She occ has SOB when she lies down. She also c/o cough with clear sputum- worse at night and wakes her up.  She uses her albuterol inhaler and neb both 1-2 x daily on average.   cough wax and wanes x 6 months 24/7 with freq throat clearing and noct exacerbation  symbicort 80 no better  pred no better  was able to treadmill x two years prior to OV  Now doe x more than room to room and immediate in supine position   NO obvious day to day or daytime variability or assoc truly excess/ purulent sputum or mucus plugs or hemoptysis or cp or chest tightness, subjective wheeze or overt sinus or hb symptoms. No unusual exposure hx or h/o childhood pna/ asthma or knowledge of premature birth. Also denies any obvious fluctuation of symptoms with weather or environmental changes or other aggravating or alleviating factors except as outlined above      Tests:   Imaging:   Cardiac:  01/31/2012-echocardiogram-60 to 65% with LV ejection fraction  Labs:   Micro:   Chart Review:       04/10/18  OV   Patient  reporting office today feeling much better.  Patient says she is having occasional wheeze but overall feels a lot better than how she felt her last appointment here.  Patient reporting that they since taking the Benicar blood pressures been much more under control and less symptomatic.  Patient reporting adherence to Symbicort 80.  Also using her nebulizers daily.  And also using her rescue inhaler occasionally.  Patient reporting adherence to Prevacid and Pepcid.  Patient does admit that she is not following the best diet and she knows that this makes her acid reflux worse.   No Known Allergies  Immunization History  Administered Date(s) Administered  . Influenza-Unspecified 08/10/2017    Past Medical History:  Diagnosis Date  . Asthma   . HTN (hypertension)   . Pneumonia     Tobacco History: Social History   Tobacco Use  Smoking Status Former Smoker  . Packs/day: 0.25  . Years: 10.00  . Pack years: 2.50  . Types: Cigarettes  . Last attempt to quit: 01/26/2018  . Years since quitting: 0.2  Smokeless Tobacco Never Used   Counseling given: Not Answered   Outpatient Encounter Medications as of 04/10/2018  Medication Sig  . albuterol (PROVENTIL HFA;VENTOLIN HFA) 108 (90 BASE) MCG/ACT inhaler Inhale 2 puffs into the lungs every 6 (six) hours as needed for wheezing  or shortness of breath.  Marland Kitchen. albuterol (PROVENTIL) (2.5 MG/3ML) 0.083% nebulizer solution 1 vial in neb every 4 hours as needed  . budesonide-formoterol (SYMBICORT) 80-4.5 MCG/ACT inhaler Inhale 2 puffs into the lungs 2 (two) times daily.  . famotidine (PEPCID) 20 MG tablet One at bedtime  . lansoprazole (PREVACID) 30 MG capsule Take 1 capsule (30 mg total) by mouth daily.  . Liraglutide -Weight Management (SAXENDA) 18 MG/3ML SOPN 1 injection daily  . montelukast (SINGULAIR) 10 MG tablet Take 1 tablet by mouth at bedtime.  Marland Kitchen. olmesartan-hydrochlorothiazide (BENICAR HCT) 40-25 MG tablet Take 1 tablet by mouth daily.    No facility-administered encounter medications on file as of 04/10/2018.      Review of Systems  Constitutional:  +fatigue  No  weight loss, night sweats,  fevers, chills HEENT:   No headaches,  Difficulty swallowing,  Tooth/dental problems, or  Sore throat, No sneezing, itching, ear ache, nasal congestion, post nasal drip  CV: No chest pain,  orthopnea, PND, swelling in lower extremities, anasarca, dizziness, palpitations, syncope  GI: No heartburn, indigestion, abdominal pain, nausea, vomiting, diarrhea, change in bowel habits, loss of appetite, bloody stools Resp: +wheezing, occasional sob with exertion  No shortness of breath at rest.  No excess mucus, no productive cough,  No non-productive cough,  No coughing up of blood.  No change in color of mucus.    No chest wall deformity Skin: no rash, lesions, no skin changes. GU: no dysuria, change in color of urine, no urgency or frequency.  No flank pain, no hematuria  MS:  No joint pain or swelling.  No decreased range of motion.  No back pain. Psych:  No change in mood or affect. No depression or anxiety.  No memory loss.   Physical Exam  BP (!) 146/82   Pulse 93   Ht 5\' 3"  (1.6 m)   Wt 256 lb 3.2 oz (116.2 kg)   LMP 12/19/2005   SpO2 97%   BMI 45.38 kg/m   Wt Readings from Last 3 Encounters:  04/10/18 256 lb 3.2 oz (116.2 kg)  04/02/18 256 lb (116.1 kg)  10/08/13 240 lb (108.9 kg)     GEN: A/Ox3; pleasant, NAD, well nourished, obese   HEENT:  [place]/AT,  EACs-clear, TMs-wnl, NOSE-clear, THROAT-  +post nasal drip, +mallampati III  NECK:  Supple w/ fair ROM; no JVD; normal carotid impulses w/o bruits; no lymphadenopathy.    RESP:  +slight expiratory wheeze, air movement in all lobes  no accessory muscle use, no dullness to percussion  CARD:  RRR, no m/r/g, no peripheral edema, pulses intact, no cyanosis or clubbing.  GI:   Soft & nt; nml bowel sounds; no organomegaly or masses detected.   Musco: Warm bil, no deformities  or joint swelling noted.   Neuro: alert, no focal deficits noted.    Skin: Warm, no lesions or rashes    Lab Results:  CBC    Component Value Date/Time   WBC 6.0 10/08/2013 0953   RBC 4.88 10/08/2013 0953   HGB 13.0 10/08/2013 0953   HCT 39.7 10/08/2013 0953   PLT 218 10/08/2013 0953   MCV 81.4 10/08/2013 0953   MCV 81.2 12/20/2011 1710   MCH 26.6 10/08/2013 0953   MCHC 32.7 10/08/2013 0953   RDW 14.8 10/08/2013 0953   LYMPHSABS 1.9 10/08/2013 0953   MONOABS 0.7 10/08/2013 0953   EOSABS 0.2 10/08/2013 0953   BASOSABS 0.0 10/08/2013 0953    BMET    Component  Value Date/Time   NA 141 04/10/2018 1419   K 3.7 04/10/2018 1419   CL 103 04/10/2018 1419   CO2 30 04/10/2018 1419   GLUCOSE 118 (H) 04/10/2018 1419   BUN 17 04/10/2018 1419   CREATININE 0.75 04/10/2018 1419   CREATININE 0.66 12/20/2011 1710   CALCIUM 9.6 04/10/2018 1419   GFRNONAA >90 10/08/2013 0953   GFRAA >90 10/08/2013 0953    BNP    Component Value Date/Time   BNP 26.1 12/20/2011 1733    ProBNP    Component Value Date/Time   PROBNP 308.7 (H) 10/08/2013 0954    Imaging: No results found.   Assessment & Plan:   52 year old patient seen in follow-up today.  Patient doing well on Benicar.  Blood pressure stable.  Bmet today showing stable kidney functioning and electrolytes.  Glucose slightly elevated at 118.  Patient to keep follow-up with Dr. Sherene Sires next month and to complete pulmonary function test.   Patient to notify primary care that the blood pressure medication has been switched to Benicar.  That they are doing well.  In for further work-up and monitoring of her hypertension.  Discussed extensively with patient the importance of GERD management, as well as diet and how that plays a role in managing her GERD.  Patient agrees to start working on this.  Handouts provided on GERD diet and GERD to patient today in office.  Also discussed with patient the ability to start a daily  antihistamine.  Patient said that they were no longer able to do the speed his insurance did not cover it.  After discussion with patient patient agreed to go get generic antihistamine from [employer] and to start taking this daily.    Cough variant asthma vs uacs   Can use albuterol nebulizers as needed every 6 hours for wheezing or shortness of breath Continue Symbicort 80  2 puffs in the morning right when you wake up, rinse your mouth, 12 hours later 2 puffs of Symbicort 80 Can start daily antihistamine >>> Can get generic of Claritin, Zyrtec, or Allegra  ---> choose one of these  >>> Take daily Review information below regarding reflux and appropriate diet Continue Prevacid as well as Pepcid to manage her acid reflux   Gastroesophageal reflux disease  Review information below regarding reflux and appropriate diet Continue Prevacid as well as Pepcid to manage her acid reflux   Essential hypertension Continue Benicar as prescribed   Morbid obesity due to excess calories (HCC) Continue to work towards healthy weight     Coral Ceo, NP 04/10/2018

## 2018-04-10 ENCOUNTER — Ambulatory Visit (INDEPENDENT_AMBULATORY_CARE_PROVIDER_SITE_OTHER): Payer: BC Managed Care – PPO | Admitting: Pulmonary Disease

## 2018-04-10 ENCOUNTER — Encounter: Payer: Self-pay | Admitting: Pulmonary Disease

## 2018-04-10 ENCOUNTER — Other Ambulatory Visit (INDEPENDENT_AMBULATORY_CARE_PROVIDER_SITE_OTHER): Payer: BC Managed Care – PPO

## 2018-04-10 VITALS — BP 146/82 | HR 93 | Ht 63.0 in | Wt 256.2 lb

## 2018-04-10 DIAGNOSIS — I10 Essential (primary) hypertension: Secondary | ICD-10-CM

## 2018-04-10 DIAGNOSIS — K219 Gastro-esophageal reflux disease without esophagitis: Secondary | ICD-10-CM | POA: Insufficient documentation

## 2018-04-10 DIAGNOSIS — J45991 Cough variant asthma: Secondary | ICD-10-CM

## 2018-04-10 LAB — BASIC METABOLIC PANEL
BUN: 17 mg/dL (ref 6–23)
CHLORIDE: 103 meq/L (ref 96–112)
CO2: 30 mEq/L (ref 19–32)
Calcium: 9.6 mg/dL (ref 8.4–10.5)
Creatinine, Ser: 0.75 mg/dL (ref 0.40–1.20)
GFR: 105.13 mL/min (ref >60.00)
Glucose, Bld: 118 mg/dL — ABNORMAL HIGH (ref 70–99)
POTASSIUM: 3.7 meq/L (ref 3.5–5.1)
Sodium: 141 mEq/L (ref 135–145)

## 2018-04-10 NOTE — Progress Notes (Signed)
Your lab results have come back today.  Your kidney functioning your electrolytes are stable.  He did have a slightly elevated glucose.  This is probably due to you having lunch and not fasting before.  Continue close follow-up with primary care and notify them that you have been on Benicar now for your blood pressure.  So they can continue to monitor your labs as well as monitor your blood pressure.  As we discussed in office today your blood pressure has improved significantly but still is elevated.  Elisha HeadlandBrian Mack FNP

## 2018-04-10 NOTE — Assessment & Plan Note (Signed)
Continue to work towards healthy weight 

## 2018-04-10 NOTE — Assessment & Plan Note (Signed)
  Review information below regarding reflux and appropriate diet Continue Prevacid as well as Pepcid to manage her acid reflux

## 2018-04-10 NOTE — Patient Instructions (Addendum)
Continue Benicar as prescribed Can use albuterol nebulizers as needed every 6 hours for wheezing or shortness of breath Continue Symbicort 80  2 puffs in the morning right when you wake up, rinse your mouth, 12 hours later 2 puffs of Symbicort 80 Can start daily antihistamine >>> Can get generic of Claritin, Zyrtec, or Allegra  ---> choose one of these  >>> Take daily Review information below regarding reflux and appropriate diet Continue Prevacid as well as Pepcid to manage her acid reflux    Keep follow-up with Dr. Sherene SiresWert as well as completing pulmonary function test on that day.  Please contact the office if your symptoms worsen or you have concerns that you are not improving.   Thank you for choosing [facility] for your healthcare, and for allowing us to partner with you on your healthcare journey. I am thankful to be able to provide care to you today.   Elisha HeadlandBrian Mack FNP-C        Gastroesophageal Reflux Disease, Adult Normally, food travels down the esophagus and stays in the stomach to be digested. If a person has gastroesophageal reflux disease (GERD), food and stomach acid move back up into the esophagus. When this happens, the esophagus becomes sore and swollen (inflamed). Over time, GERD can make small holes (ulcers) in the lining of the esophagus. Follow these instructions at home: Diet  Follow a diet as told by your doctor. You may need to avoid foods and drinks such as: ? Coffee and tea (with or without caffeine). ? Drinks that contain alcohol. ? Energy drinks and sports drinks. ? Carbonated drinks or sodas. ? Chocolate and cocoa. ? Peppermint and mint flavorings. ? Garlic and onions. ? Horseradish. ? Spicy and acidic foods, such as peppers, chili powder, curry powder, vinegar, hot sauces, and BBQ sauce. ? Citrus fruit juices and citrus fruits, such as oranges, lemons, and limes. ? Tomato-based foods, such as red sauce, chili, salsa, and pizza with red  sauce. ? Fried and fatty foods, such as donuts, french fries, potato chips, and high-fat dressings. ? High-fat meats, such as hot dogs, rib eye steak, sausage, ham, and bacon. ? High-fat dairy items, such as whole milk, butter, and cream cheese.  Eat small meals often. Avoid eating large meals.  Avoid drinking large amounts of liquid with your meals.  Avoid eating meals during the 2-3 hours before bedtime.  Avoid lying down right after you eat.  Do not exercise right after you eat. General instructions  Pay attention to any changes in your symptoms.  Take over-the-counter and prescription medicines only as told by your doctor. Do not take aspirin, ibuprofen, or other NSAIDs unless your doctor says it is okay.  Do not use any tobacco products, including cigarettes, chewing tobacco, and e-cigarettes. If you need help quitting, ask your doctor.  Wear loose clothes. Do not wear anything tight around your waist.  Raise (elevate) the head of your bed about 6 inches (15 cm).  Try to lower your stress. If you need help doing this, ask your doctor.  If you are overweight, lose an amount of weight that is healthy for you. Ask your doctor about a safe weight loss goal.  Keep all follow-up visits as told by your doctor. This is important. Contact a doctor if:  You have new symptoms.  You lose weight and you do not know why it is happening.  You have trouble swallowing, or it hurts to swallow.  You have wheezing or a  cough that keeps happening.  Your symptoms do not get better with treatment.  You have a hoarse voice. Get help right away if:  You have pain in your arms, neck, jaw, teeth, or back.  You feel sweaty, dizzy, or light-headed.  You have chest pain or shortness of breath.  You throw up (vomit) and your throw up looks like blood or coffee grounds.  You pass out (faint).  Your poop (stool) is bloody or black.  You cannot swallow, drink, or eat. This information  is not intended to replace advice given to you by your health care provider. Make sure you discuss any questions you have with your health care provider. Document Released: 04/01/2008 Document Revised: 03/21/2016 Document Reviewed: 02/08/2015 Elsevier Interactive Patient Education  2018 [employer].   Food Choices for Gastroesophageal Reflux Disease, Adult When you have gastroesophageal reflux disease (GERD), the foods you eat and your eating habits are very important. Choosing the right foods can help ease your discomfort. What guidelines do I need to follow?  Choose fruits, vegetables, whole grains, and low-fat dairy products.  Choose low-fat meat, fish, and poultry.  Limit fats such as oils, salad dressings, butter, nuts, and avocado.  Keep a food diary. This helps you identify foods that cause symptoms.  Avoid foods that cause symptoms. These may be different for everyone.  Eat small meals often instead of 3 large meals a day.  Eat your meals slowly, in a place where you are relaxed.  Limit fried foods.  Cook foods using methods other than frying.  Avoid drinking alcohol.  Avoid drinking large amounts of liquids with your meals.  Avoid bending over or lying down until 2-3 hours after eating. What foods are not recommended? These are some foods and drinks that may make your symptoms worse: Vegetables Tomatoes. Tomato juice. Tomato and spaghetti sauce. Chili peppers. Onion and garlic. Horseradish. Fruits Oranges, grapefruit, and lemon (fruit and juice). Meats High-fat meats, fish, and poultry. This includes hot dogs, ribs, ham, sausage, salami, and bacon. Dairy Whole milk and chocolate milk. Sour cream. Cream. Butter. Ice cream. Cream cheese. Drinks Coffee and tea. Bubbly (carbonated) drinks or energy drinks. Condiments Hot sauce. Barbecue sauce. Sweets/Desserts Chocolate and cocoa. Donuts. Peppermint and spearmint. Fats and Oils High-fat foods. This includes  [place] fries and potato chips. Other Vinegar. Strong spices. This includes black pepper, white pepper, red pepper, cayenne, curry powder, cloves, ginger, and chili powder. The items listed above may not be a complete list of foods and drinks to avoid. Contact your dietitian for more information. This information is not intended to replace advice given to you by your health care provider. Make sure you discuss any questions you have with your health care provider. Document Released: 04/14/2012 Document Revised: 03/21/2016 Document Reviewed: 08/18/2013 Elsevier Interactive Patient Education  2017 [employer].

## 2018-04-10 NOTE — Assessment & Plan Note (Signed)
Continue Benicar as prescribed

## 2018-04-10 NOTE — Assessment & Plan Note (Addendum)
  Can use albuterol nebulizers as needed every 6 hours for wheezing or shortness of breath Continue Symbicort 80  2 puffs in the morning right when you wake up, rinse your mouth, 12 hours later 2 puffs of Symbicort 80 Can start daily antihistamine >>> Can get generic of Claritin, Zyrtec, or Allegra  ---> choose one of these  >>> Take daily Review information below regarding reflux and appropriate diet Continue Prevacid as well as Pepcid to manage her acid reflux

## 2018-04-13 ENCOUNTER — Telehealth: Payer: Self-pay | Admitting: Pulmonary Disease

## 2018-04-13 ENCOUNTER — Encounter: Payer: Self-pay | Admitting: Neurology

## 2018-04-13 NOTE — Telephone Encounter (Signed)
Notes recorded by Coral CeoMack, Brian P, NP on 04/10/2018 at 4:41 PM EDT Your lab results have come back today. Your kidney functioning your electrolytes are stable. He did have a slightly elevated glucose. This is probably due to you having lunch and not fasting before. Continue close follow-up with primary care and notify them that you have been on Benicar now for your blood pressure. So they can continue to monitor your labs as well as monitor your blood pressure. As we discussed in office today your blood pressure has improved significantly but still is elevated.  Jessica HeadlandBrian Mack FNP ------------------------------------------- Spoke with pt. She is aware of results. Nothing further was needed.

## 2018-04-13 NOTE — Progress Notes (Signed)
Chart and office note reviewed in detail  > agree with a/p as outlined    

## 2018-04-14 ENCOUNTER — Ambulatory Visit: Payer: BC Managed Care – PPO | Admitting: Neurology

## 2018-04-14 ENCOUNTER — Encounter: Payer: Self-pay | Admitting: Neurology

## 2018-04-14 VITALS — BP 123/87 | HR 79 | Ht 63.0 in | Wt 256.0 lb

## 2018-04-14 DIAGNOSIS — R011 Cardiac murmur, unspecified: Secondary | ICD-10-CM | POA: Diagnosis not present

## 2018-04-14 DIAGNOSIS — J4541 Moderate persistent asthma with (acute) exacerbation: Secondary | ICD-10-CM

## 2018-04-14 DIAGNOSIS — J45991 Cough variant asthma: Secondary | ICD-10-CM

## 2018-04-14 DIAGNOSIS — Z6841 Body Mass Index (BMI) 40.0 and over, adult: Secondary | ICD-10-CM | POA: Diagnosis not present

## 2018-04-14 DIAGNOSIS — K219 Gastro-esophageal reflux disease without esophagitis: Secondary | ICD-10-CM

## 2018-04-14 DIAGNOSIS — I10 Essential (primary) hypertension: Secondary | ICD-10-CM

## 2018-04-14 DIAGNOSIS — G4719 Other hypersomnia: Secondary | ICD-10-CM

## 2018-04-14 NOTE — Progress Notes (Signed)
SLEEP MEDICINE CLINIC   Provider:  Melvyn Novas, M D  Primary Care Physician:  Dorothyann Peng, MD   Referring Provider: Dorothyann Peng, MD    Chief Complaint  Patient presents with  . New Patient (Initial Visit)    pt alone, rm 11. pt states that she has no daytime energy nor motivation. The pt's mother and brother both have sleep apnea and her pcp would like for her to be evaluated for OSA in the setting of acute asthma exacerbation, HTN. she has been told that she snores and stops breathing in her sleep. She has never had a sleep study .    HPI:  Jessica Snow is a 52 y.o. female patient with excessive yawning and coughing , seen here as a referral from Dr. Allyne Gee for a sleep evaluation.   I had the pleasure of meeting with ridging of the right today a new patient to our practice seen on 14 April 2018 upon referral by Dr. Murtis Sink.  The patient is here to be evaluated for possible sleep apnea, she has been known to be excessively daytime sleepy, her granddaughter often noticed her to doze off when she does not intend to fall asleep.  The irresistible urge to fall asleep is one complaint, another is a poor quality of sleep at night.  She has a history of asthma which has recently exacerbated and had actually seen Dr. Prescott Parma and pulmonology at the beginning of June.  She feels short of breath has coughing wheezing and she snores.  She feels that she has decreased daytime energy and excessive daytime fatigue feels that she does not get enough sleep and she is also gaining weight.  Hypertension has been poorly controlled over the last visits with PCP and pulmonology she is also diagnosed with prediabetes and is considered morbidly obese at this time.    Chief complaint according to patient :" I need better sleep"  Sleep habits are as follows:   The patient usually watches TV with her young granddaughter, up until about 8 PM.  The child to go to sleep around that time and she  will retreat about 07/06/1927 to her bedroom.  She does not have as much difficulties to fall asleep as to stay asleep.  Her bedroom is described as cool, quiet and dark, she uses background fan and has menopausal hot flashes- she reports night sweats since 2010. Nocturia times 2.  Sometimes she wakes up soaked in moisture, but she does not recall palpitations with that.  She often talks and sometimes wakes up sometimes from coughing or shortness of breath.  Again, she has been witnessed to snore loudly.  She prefers to sleep on her left side, 2 pillows. She has acid reflux and can't tolerate sleeping flat.She set's her school-year work-day alarm for 5.15 and has to be at work at 6.30, commutes for 20 minutes. She estimates 6 hours of nocturnal sleep.     Sleep medical history and family sleep history:  Mother and brother have OSA.    Social history: Single mother, grandmother living with her son and mother - she has 2 children adult son and daughter- She quit smoking just last month- in May 2019 ( 2 cig a day)  she has not been drinking alcohol in over 8 years, she has not used recreational drugs in over 10 years, and she will have 1 cup of coffee a day but does not endorse other caffeinated beverages. 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  Review of Systems: Out of a complete 14 system review, the patient complains of only the following symptoms, and all other reviewed systems are negative. Fatigue and EDS- snoring, wheezing and apnea.   Epworth score; 16 points  How likely are you to doze in the following situations: 0 = not likely, 1 = slight chance, 2 = moderate chance, 3 = high chance  Sitting and Reading? 3 Watching Television? 3 Sitting inactive in a public place (theater or meeting)? 1 As a passenger in a car for an hour without a break?2  Lying down in the afternoon when circumstances permit?3 Sitting and talking to someone? 1 - yes, on the phone !    Sitting quietly after lunch without alcohol? 2 In a car, while stopped for a few minutes in traffic? 1  Total = 16/ 24        Fatigue severity score endorsed at 52/ 63 points   , depression score N/A   Social History   Socioeconomic History  . Marital status: Single    Spouse name: Not on file  . Number of children: Not on file  . Years of education: Not on file  . Highest education level: Not on file  Occupational History  . Occupation: Production designer, theatre/television/film at [employer].    Employer: [employer]  Social Needs  . Financial resource strain: Not on file  . Food insecurity:    Worry: Not on file    Inability: Not on file  . Transportation needs:    Medical: Not on file    Non-medical: Not on file  Tobacco Use  . Smoking status: Former Smoker    Packs/day: 0.25    Years: 10.00    Pack years: 2.50    Types: Cigarettes    Last attempt to quit: 01/26/2018    Years since quitting: 0.2  . Smokeless tobacco: Never Used  Substance and Sexual Activity  . Alcohol use: No  . Drug use: No  . Sexual activity: Not on file  Lifestyle  . Physical activity:    Days per week: Not on file    Minutes per session: Not on file  . Stress: Not on file  Relationships  . Social connections:    Talks on phone: Not on file    Gets together: Not on file    Attends religious service: Not on file    Active member of club or organization: Not on file    Attends meetings of clubs or organizations: Not on file    Relationship status: Not on file  . Intimate partner violence:    Fear of current or ex partner: Not on file    Emotionally abused: Not on file    Physically abused: Not on file    Forced sexual activity: Not on file  Other Topics Concern  . Not on file  Social History Narrative   Lives with two children.      Family History  Problem Relation Age of Onset  . Hypertension Mother   . Diabetes Mother     Past Medical History:  Diagnosis Date  . Asthma   . HTN (hypertension)   . Pneumonia      Past Surgical History:  Procedure Laterality Date  . TOTAL VAGINAL HYSTERECTOMY  2007    Current Outpatient Medications  Medication Sig Dispense Refill  . albuterol (PROVENTIL HFA;VENTOLIN HFA) 108 (90 BASE) MCG/ACT inhaler Inhale 2 puffs into the lungs every 6 (six) hours  as needed for wheezing or shortness of breath. 1 Inhaler 2  . albuterol (PROVENTIL) (2.5 MG/3ML) 0.083% nebulizer solution 1 vial in neb every 4 hours as needed    . budesonide-formoterol (SYMBICORT) 80-4.5 MCG/ACT inhaler Inhale 2 puffs into the lungs 2 (two) times daily. 1 Inhaler 11  . famotidine (PEPCID) 20 MG tablet One at bedtime 30 tablet 11  . fexofenadine (ALLEGRA) 180 MG tablet Take 180 mg by mouth daily.    . lansoprazole (PREVACID) 30 MG capsule Take 1 capsule (30 mg total) by mouth daily. 30 capsule 11  . Liraglutide -Weight Management (SAXENDA) 18 MG/3ML SOPN 1 injection daily    . lisinopril-hydrochlorothiazide (PRINZIDE,ZESTORETIC) 20-25 MG tablet Take 1 tablet by mouth daily.    . montelukast (SINGULAIR) 10 MG tablet Take 1 tablet by mouth at bedtime.    Marland Kitchen. olmesartan-hydrochlorothiazide (BENICAR HCT) 40-25 MG tablet Take 1 tablet by mouth daily. 30 tablet 11   No current facility-administered medications for this visit.     new medication through pulmonologist : benicar , d/c  lisinopril   Allergies as of 04/14/2018  . (No Known Allergies)    Vitals: BP 123/87   Pulse 79   Ht 5\' 3"  (1.6 m)   Wt 256 lb (116.1 kg)   LMP 12/19/2005   BMI 45.35 kg/m  Last Weight:  Wt Readings from Last 1 Encounters:  04/14/18 256 lb (116.1 kg)   ZOX:WRUEBMI:Body mass index is 45.35 kg/m.     Last Height:   Ht Readings from Last 1 Encounters:  04/14/18 5\' 3"  (1.6 m)    Physical exam:  General: The patient is awake, alert and appears not in acute distress. The patient is well groomed. Head: Normocephalic, atraumatic. Neck is supple. Mallampati 5  neck circumference:18.  Nasal airflow patent , right sinus  pressure- deviated septum on the right more narrow.  Retrognathia is seen. She wears braces.  Cardiovascular:  Regular rate and rhythm, with an ejection murmur.     no carotid bruit, and neither  distended neck veins. Respiratory: Lungs are clear to auscultation. Rales, not wheezing.  Skin:  Without evidence of edema, or rash Trunk: BMI is 45.3 . The patient's posture is erect.  Neurologic exam : The patient is awake and alert, oriented to place and time.   Memory subjective described as intact.  Attention span & concentration ability appears normal.  Speech is fluent, without dysarthria, dysphonia or aphasia.  Mood and affect are somber .  Cranial nerves: Pupils are equal and briskly reactive to light.  Funduscopic exam without  evidence of pallor or edema.  Extraocular movements  in vertical and horizontal planes intact and without nystagmus. Visual fields by finger perimetry are intact. Hearing to finger rub intact.  Facial sensation intact to fine touch. Facial motor strength is symmetric and tongue and uvula move midline. Shoulder shrug was symmetrical.  Motor exam:   Normal tone, muscle bulk and symmetric strength in all extremities. Good grip strength. Sensory:  Fine touch, pinprick and vibration / Proprioception tested in the upper extremities was normal. She  reports stinging dysesthesias in her feet.  Coordination:  Finger-to-nose maneuver  normal without evidence of ataxia, dysmetria or tremor. no change in handwriting,  Gait and station: Patient walks without assistive device . Turns with 3-4 Steps. Wide based gait. Romberg testing negative. Deep tendon reflexes: had a fractured tibia on the right- loss of reflex. DTR  in the upper extremities are symmetric and intact.   Assessment:  After physical and neurologic examination, review of laboratory studies,  Personal review of imaging studies, reports of other /same  Imaging studies, results of polysomnography and / or  neurophysiology testing and pre-existing records as far as provided in visit., my assessment is   1) Excessive daytime fatigue and excessive sleepiness likely related to her fragmented sleep, witnessed snoring, gasping a, coughing and reported acid reflux at night.  Check for OSA,   2) Mrs. Rekowski also has been suffering from chronic cough but now that interrupts her sleep and could have various reasons but it is often related to postnasal drip and sinusitis, allergic rhinitis, can be asthmatic in combination with wheezing, can mimic apnea if related to acid reflux which then also can turn into coughing.  She also just recently quit smoking and chronic bronchitis from other inhaled irritants it certainly in the differential.  She has not been on renin angiotensin blocker.     3) large neck, patient is considered morbidly obese a with narrow airway and congested nasal passage . On a weight loss shot at this time. She has not implemented an exercise regimen ( asthma) and address dietary habits  and stress eating.   4) HTN was today well controlled under new medication.    The patient was advised of the nature of the diagnosed disorder , the treatment options and the  risks for general health and wellness arising from not treating the condition.   I spent more than 45 minutes of face to face time with the patient.  Greater than 50% of time was spent in counseling and coordination of care. We have discussed the diagnosis and differential and I answered the patient's questions.    Plan:  Treatment plan and additional workup :  I would like for Mrs. Wish to have the benefit of a sleep study, treating sleep apnea also will help with weight loss, further control of diabetes and hypertension.  It will eliminate risk factors directly and indirectly for coronary artery disease and stroke.  I think that her excessive daytime sleepiness is partially due to her restricted nocturnal sleep time there is not  enough time allocated for sleep, and her sleep is too fragmented to be sound and refreshing.  I will order an attended sleep study split  At AHI 30, and she has some underlying pulmonary factors I would like for her to have capnography with her sleep study.  I will also provide a nasal spray to make sure that her nasal passage allows airway patency, otherwise a CPAP would not be applicable.  She appears motivated but too fatigued and probably too short of breath to exercise, but would need some instruction and coaching and implementing exercise and dietary changes to lose weight.  I suggested to see Dr. Quillian Quince for medical weight management which would require regular clinic visits and includes metabolic rate.   Rv after sleep study,   Melvyn Novas, MD 04/14/2018, 3:19 PM  Certified in Neurology by ABPN Certified in Sleep Medicine by [facility] [address] [place], [place] [address]

## 2018-04-19 ENCOUNTER — Ambulatory Visit (INDEPENDENT_AMBULATORY_CARE_PROVIDER_SITE_OTHER): Payer: BC Managed Care – PPO | Admitting: Neurology

## 2018-04-19 DIAGNOSIS — G4719 Other hypersomnia: Secondary | ICD-10-CM

## 2018-04-19 DIAGNOSIS — I10 Essential (primary) hypertension: Secondary | ICD-10-CM

## 2018-04-19 DIAGNOSIS — J45991 Cough variant asthma: Secondary | ICD-10-CM

## 2018-04-19 DIAGNOSIS — R011 Cardiac murmur, unspecified: Secondary | ICD-10-CM

## 2018-04-19 DIAGNOSIS — G4710 Hypersomnia, unspecified: Secondary | ICD-10-CM | POA: Diagnosis not present

## 2018-04-19 DIAGNOSIS — K219 Gastro-esophageal reflux disease without esophagitis: Secondary | ICD-10-CM

## 2018-04-19 DIAGNOSIS — J4541 Moderate persistent asthma with (acute) exacerbation: Secondary | ICD-10-CM

## 2018-04-19 DIAGNOSIS — Z6841 Body Mass Index (BMI) 40.0 and over, adult: Principal | ICD-10-CM

## 2018-05-12 NOTE — Procedures (Signed)
PATIENT'S NAME:  Jessica Snow, Jessica Snow DOB:      1966-06-30      MR#:    [id number]     DATE OF RECORDING: 04/19/2018 REFERRING M.D.:  Dorothyann Peng, MD Study Performed:   Baseline Polysomnogram HISTORY:   The patient is a 52 year old female, morbidly obese, and to be evaluated for possible sleep apnea, she has been known to be excessively daytime sleepy. Her granddaughter often notices her to doze off when she does not intend to fall asleep.  The irresistible urge to fall asleep is one complaint, another is a poor quality of sleep at night.  She has a history of asthma that has recently exacerbated and had actually seen pulmonology at the beginning of June.  She feels short of breath, has coughing, wheezing, and she snores.  She feels that she has decreased daytime energy and excessive daytime fatigue, feels that she does not get enough sleep, and she is gaining weight.  Hypertension has been poorly controlled over the last visits with PCP and pulmonology -she is also diagnosed with prediabetes and is considered morbidly obese at this time.   The patient endorsed the Epworth Sleepiness Scale at 16/24 points.   The patient's weight 256 pounds with a height of 63 (inches), resulting in a BMI of 45.3 kg/m2. The patient's neck circumference measured 18 inches.  CURRENT MEDICATIONS: Albuterol, Budesonide, Famotidine, Fexofenadine, Lansoprazole, Liraglutide, Lisinopril, Montelukast and Olmesartan.   PROCEDURE:  This is a multichannel digital polysomnogram utilizing the Somnostar 11.2 system.  Electrodes and sensors were applied and monitored per AASM Specifications.   EEG, EOG, Chin and Limb EMG, were sampled at 200 Hz.  ECG, Snore and Nasal Pressure, Thermal Airflow, Respiratory Effort, CPAP Flow and Pressure, Oximetry was sampled at 50 Hz. Digital video and audio were recorded.      BASELINE STUDY: Lights Out was at 21:17 and Lights On at 05:01.  Total recording time (TRT) was 464.5 minutes, with a total sleep  time (TST) of 394 minutes. The patient's sleep latency was 11 minutes.  REM latency was 86 minutes. The sleep efficiency was 84.8 %.     SLEEP ARCHITECTURE: WASO (Wake after sleep onset) was 59.5 minutes.  There were 14.5 minutes in Stage N1, 304.5 minutes Stage N2, 0 minutes Stage N3 and 75 minutes in Stage REM.  The percentage of Stage N1 was 3.7%, Stage N2 was 77.3%, Stage N3 was 0% and Stage R (REM sleep) was 19.0%.   RESPIRATORY ANALYSIS:  There were a total of 26 respiratory events:  26 hypopneas with 0 respiratory event related arousals (RERAs). The total APNEA/HYPOPNEA INDEX (AHI) was 4.0/hour and the total RESPIRATORY DISTURBANCE INDEX was 4.0 /hour.  21 events occurred in REM sleep and 10 events in NREM. The REM AHI was 16.8 /hour, versus a non-REM AHI of .9. The patient spent 66.5 minutes of total sleep time in the supine position and 328 minutes in non-supine. The supine AHI was 8.1 versus a non-supine AHI of 3.1.  OXYGEN SATURATION & C02:  The Wake baseline 02 saturation was 96%, with the lowest being 80%. Time spent below 89% saturation equaled 13 minutes.    PERIODIC LIMB MOVEMENTS:  The patient had a total of 0 Periodic Limb Movements.  The arousals were noted as: 35 were spontaneous, 0 were associated with PLMs, and 25 were associated with respiratory events. Audio and video analysis did not show any abnormal or unusual movements, behaviors, phonations or vocalizations.  Only during  REM sleep did the AHI exacerbate to 16.8/h.  The patient took one bathroom break. Mild Snoring was noted. EKG was in keeping with normal sinus rhythm (NSR).    IMPRESSION: No clinically significant sleep apnea, PLMs, or cardiac arrhythmia, nor hypoxemia. Post-study, the patient indicated that sleep was the same as usual. She estimated a sleep latency of 2 hours ( it was 11 minutes ) and a total sleep duration of 4 hours ( over 6 hours) .  There is a significant divergence of sleep perception from the  actual sleep efficiency.      RECOMMENDATIONS:  1. Advise medical weight loss therapy to reduce snoring and hypopneas.  2. There was no physiologic sleep disorder identified. 3.  Sleep hygiene and routines need to be reviewed and implemented.      I certify that I have reviewed the entire raw data recording prior to the issuance of this report in accordance with the Standards of Accreditation of the American Academy of Sleep Medicine (AASM)    05-11-2018  Melvyn Novasarmen Festus Pursel, MD Diplomat, American Board of Psychiatry and Neurology  Diplomat, American Board of Sleep Medicine Medical Director, [facility]

## 2018-05-13 ENCOUNTER — Telehealth: Payer: Self-pay | Admitting: Neurology

## 2018-05-13 NOTE — Telephone Encounter (Signed)
Called the pt back and reviewed her sleep study results with the pt. Informed the patient there was no sleep disorder found. Reviewed healthy sleep hygiene and routines with the patient. Pt verbalized understanding. Pt has requested a referral to the weight management clinic. Order will be placed on her behalf. Pt verbalized understanding. Pt had no questions at this time but was encouraged to call back if questions arise.

## 2018-05-13 NOTE — Telephone Encounter (Signed)
Called patient to discuss sleep study results. No answer at this time. LVM for the patient to call back.   

## 2018-05-13 NOTE — Telephone Encounter (Signed)
-----   Message from Melvyn Novasarmen Dohmeier, MD sent at 05/12/2018  5:52 PM EDT ----- To my surprise: no significant apnea, hypoxemia and neither any other physiological abnormality. rec weight loss. Sleep perception was very different form the actual recorded data. Cc dr Allyne GeeSanders.

## 2018-05-14 ENCOUNTER — Other Ambulatory Visit: Payer: Self-pay | Admitting: Internal Medicine

## 2018-05-14 DIAGNOSIS — J45991 Cough variant asthma: Secondary | ICD-10-CM

## 2018-05-15 ENCOUNTER — Encounter: Payer: Self-pay | Admitting: Internal Medicine

## 2018-05-15 ENCOUNTER — Ambulatory Visit (INDEPENDENT_AMBULATORY_CARE_PROVIDER_SITE_OTHER): Payer: BC Managed Care – PPO | Admitting: Internal Medicine

## 2018-05-15 ENCOUNTER — Ambulatory Visit (INDEPENDENT_AMBULATORY_CARE_PROVIDER_SITE_OTHER)
Admission: RE | Admit: 2018-05-15 | Discharge: 2018-05-15 | Disposition: A | Discharge status: Home / Self Care | Payer: BC Managed Care – PPO | Source: Ambulatory Visit | Attending: Internal Medicine | Admitting: Internal Medicine

## 2018-05-15 ENCOUNTER — Other Ambulatory Visit (INDEPENDENT_AMBULATORY_CARE_PROVIDER_SITE_OTHER): Payer: BC Managed Care – PPO

## 2018-05-15 VITALS — BP 150/92 | HR 68 | Ht 62.9 in | Wt 260.0 lb

## 2018-05-15 DIAGNOSIS — J45991 Cough variant asthma: Secondary | ICD-10-CM

## 2018-05-15 DIAGNOSIS — I10 Essential (primary) hypertension: Secondary | ICD-10-CM

## 2018-05-15 LAB — CBC WITH DIFFERENTIAL/PLATELET
BASOS ABS: 0.1 10*3/uL (ref 0.0–0.1)
Basophils Relative: 1 % (ref 0.0–3.0)
EOS ABS: 0.9 10*3/uL — AB (ref 0.0–0.7)
Eosinophils Relative: 12 % — ABNORMAL HIGH (ref 0.0–5.0)
HEMATOCRIT: 38.8 % (ref 36.0–46.0)
Hemoglobin: 12.4 g/dL (ref 12.0–15.0)
LYMPHS ABS: 2.8 10*3/uL (ref 0.7–4.0)
LYMPHS PCT: 38.2 % (ref 12.0–46.0)
MCHC: 32 g/dL (ref 30.0–36.0)
MCV: 78.3 fl (ref 78.0–100.0)
MONOS PCT: 6.5 % (ref 3.0–12.0)
Monocytes Absolute: 0.5 10*3/uL (ref 0.1–1.0)
NEUTROS ABS: 3.1 10*3/uL (ref 1.4–7.7)
NEUTROS PCT: 42.3 % — AB (ref 43.0–77.0)
PLATELETS: 242 10*3/uL (ref 150.0–400.0)
RBC: 4.95 Mil/uL (ref 3.87–5.11)
RDW: 16.2 % — ABNORMAL HIGH (ref 11.5–15.5)
WBC: 7.3 10*3/uL (ref 4.0–10.5)

## 2018-05-15 LAB — PULMONARY FUNCTION TEST
DL/VA % PRED: 142 %
DL/VA: 6.64 ml/min/mmHg/L
DLCO unc % pred: 85 %
DLCO unc: 19.52 ml/min/mmHg
FEF 25-75 PRE: 1.47 L/s
FEF2575-%Pred-Pre: 62 %
FEV1-%Pred-Pre: 62 %
FEV1-PRE: 1.39 L
FEV1FVC-%Pred-Pre: 101 %
FEV6-%PRED-PRE: 62 %
FEV6-Pre: 1.69 L
FEV6FVC-%PRED-PRE: 103 %
FVC-%PRED-PRE: 60 %
FVC-PRE: 1.69 L
Pre FEV1/FVC ratio: 82 %
Pre FEV6/FVC Ratio: 100 %
RV % PRED: 103 %
RV: 1.8 L
TLC % pred: 76 %
TLC: 3.74 L

## 2018-05-15 IMAGING — DX DG CHEST 2V
2 series · 2 of 2 positions shown · non-contrast
Comparison: [DATE]

CLINICAL DATA: Cough, congestion, and shortness of breath for 6
weeks. Former smoker.

EXAM:
CHEST - 2 VIEW

[chest pa]
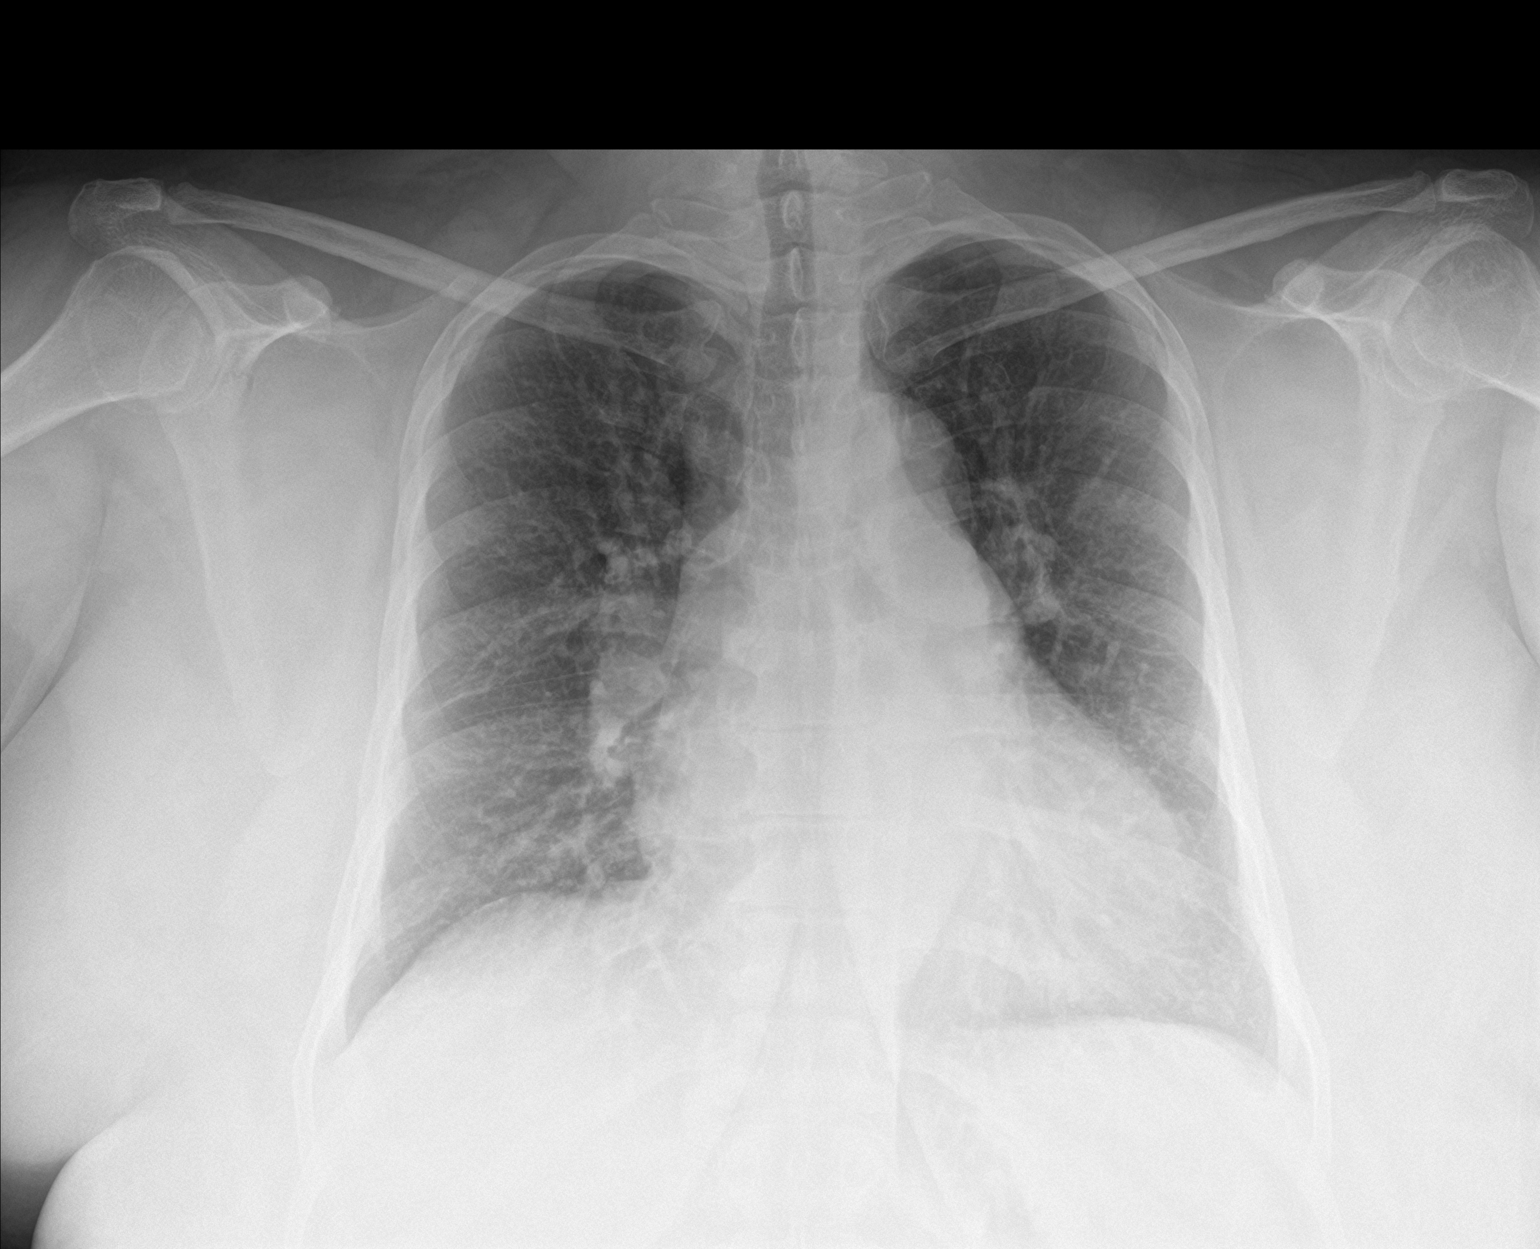

[chest lat]
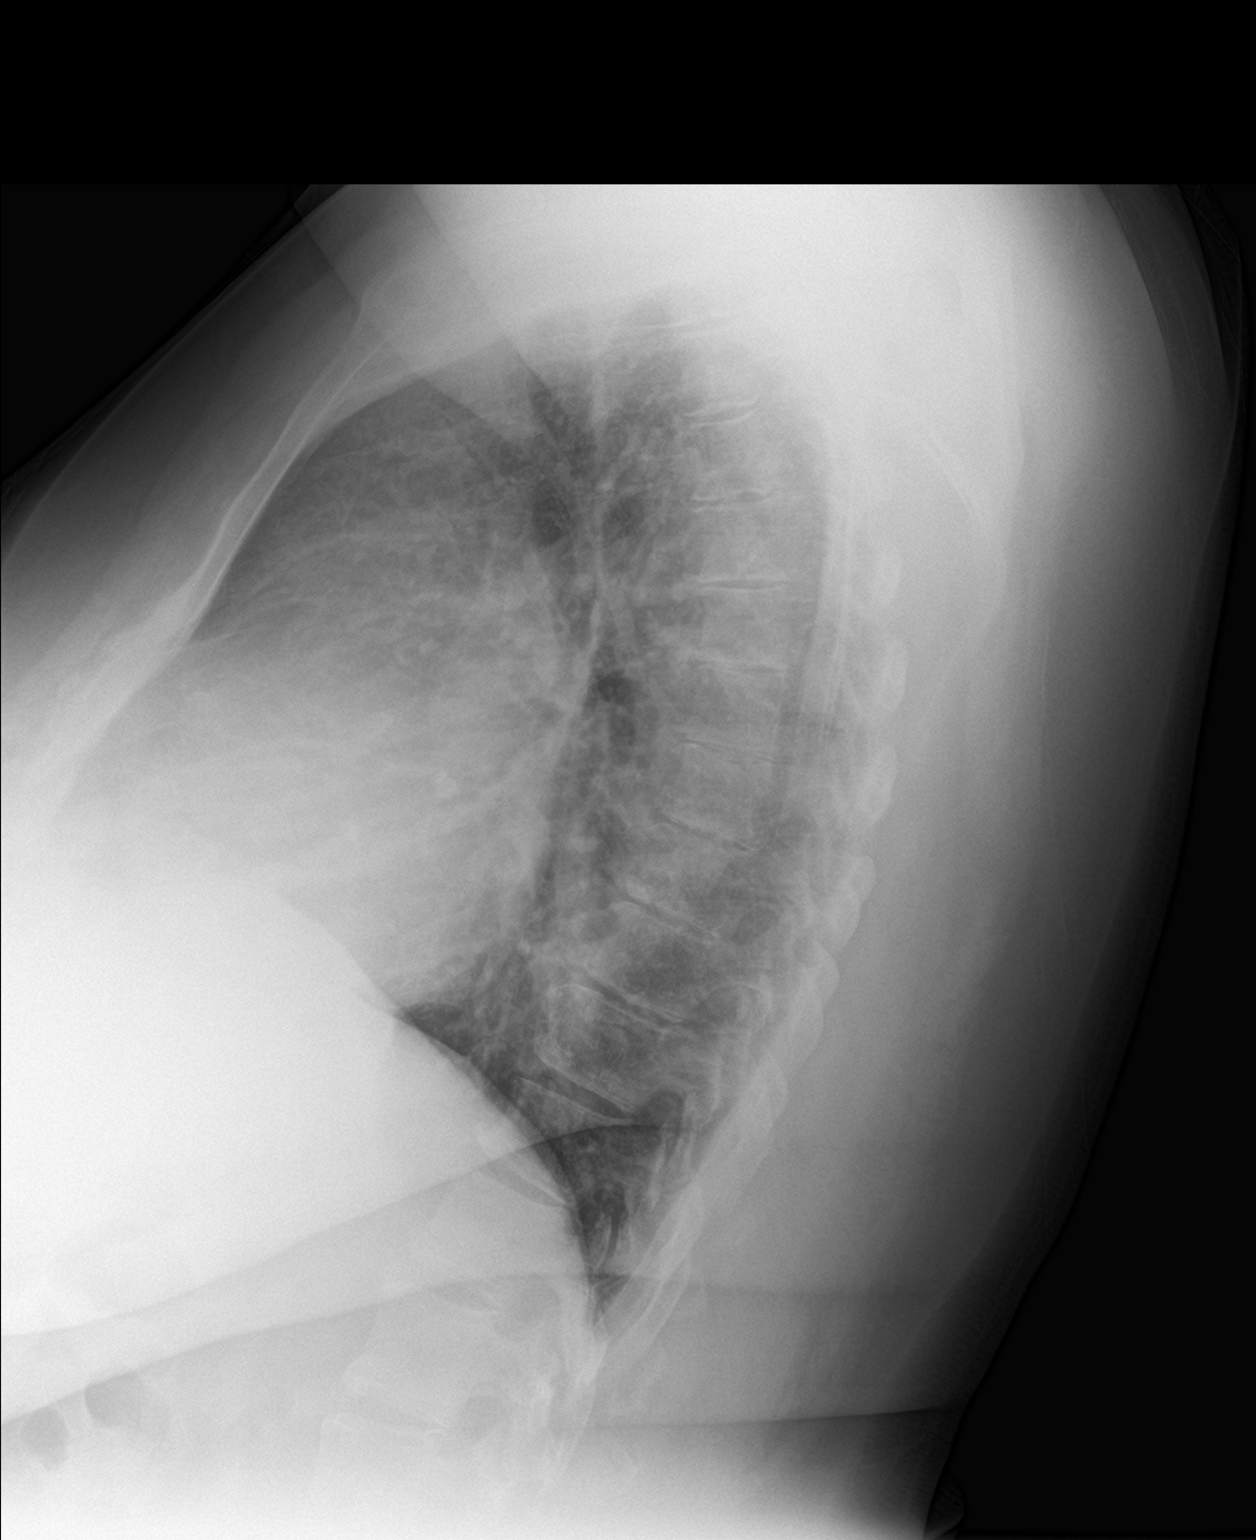

[2 of 2 positions shown; findings below may reference images not displayed]

FINDINGS: The cardiac silhouette remains at mildly enlarged. The thoracic
aorta is mildly tortuous. There is increased peribronchial
thickening compared to the prior study. No confluent airspace
opacity, overt pulmonary edema, pleural effusion, or pneumothorax is
identified. No acute osseous abnormality is seen.
IMPRESSION: Increased bronchitic changes.

## 2018-05-15 MED ORDER — TRAMADOL HCL 50 MG PO TABS
50.0000 mg | ORAL_TABLET | ORAL | 0 refills | Status: AC | PRN
Start: 2018-05-15 — End: 2018-05-20

## 2018-05-15 MED ORDER — PANTOPRAZOLE SODIUM 40 MG PO TBEC: DELAYED_RELEASE_TABLET | ORAL | 2 refills | Status: DC

## 2018-05-15 MED ORDER — PREDNISONE 10 MG PO TABS
ORAL_TABLET | ORAL | 0 refills | Status: DC
Start: 2018-05-15 — End: 2018-06-01

## 2018-05-15 MED ORDER — PANTOPRAZOLE SODIUM 40 MG PO TBEC
40.0000 mg | DELAYED_RELEASE_TABLET | Freq: Every day | ORAL | 2 refills | Status: DC
Start: 2018-05-15 — End: 2018-05-15

## 2018-05-15 NOTE — Progress Notes (Signed)
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 Pulmonary Consult    Referred by Beatrix Fetters, PA.  Pt c/o SOB for the past month.  She states she sometimes gets winded just walking short distances.  She occ has SOB when she lies down. She also c/o cough with clear sputum- worse at night and wakes her up.  She uses her albuterol inhaler and neb both 1-2 x daily on average.   cough wax and wanes x 6 months 24/7 with freq throat clearing and noct exacerbation  symbicort 80 no better  pred no better  was able to treadmill x two years prior to OV  Now doe x more than room to room and immediate in supine position  rec Stop lisinopril and start benicar 40-25 one daily and your cough should improve Continue Prevacid 30 mg  Take 30-60 min before first meal of the day and add add pepcid 20 mg one hour before bedtime  GERD diet   Please schedule a follow up office visit in 6 weeks, call sooner if needed with all medications /inhalers/ solutions in hand so we can verify exactly what you are taking. This includes all medications from all doctors and over the counters and PFT's on return    Add: have her come back in 1 week for bp check and bmet to see Demetrius Charity NP     05/15/2018  f/u ov/Martel Galvan re: uacs/ brought most of her meds/ no prevacid  Chief Complaint  Patient presents with  . Follow-up    PFT today, shortness of breath, productive  cough (yellow), frequent headaches  Dyspnea:  MMRC3 = can't walk 100 yards even at a slow pace at a flat grade s stopping due to sob   Cough: 24/7  Min yellow mucus Sleeping: R side down/ 4-5 pillows SABA use: neb and saba    No   obvious day to day or daytime variability or assoc excess/ purulent sputum or mucus plugs or hemoptysis or cp or chest tightness, subjective wheeze or overt sinus or hb symptoms. No unusual exposure hx or h/o childhood pna/ asthma or knowledge of premature birth.  Sleeping ok flat without nocturnal  or early am exacerbation  of respiratory  c/o's or need for noct saba. Also denies any obvious fluctuation of symptoms with weather or environmental changes or other aggravating or alleviating factors except as outlined above   Current Allergies, Complete Past Medical History, Past Surgical History, Family History, and Social History were reviewed in [employer] record.  ROS  The following are not active complaints unless bolded Hoarseness, sore throat, dysphagia, dental problems, itching, sneezing,  nasal congestion or discharge of excess mucus or purulent secretions, ear ache,   fever, chills, sweats, unintended wt loss or wt gain, classically pleuritic or exertional cp,  orthopnea pnd or  leg swelling, presyncope, palpitations, abdominal pain, anorexia, nausea, vomiting, diarrhea  or change in bowel habits or change in bladder habits, change in stools or change in urine, dysuria, hematuria,  rash, arthralgias, visual complaints, headache, numbness, weakness or ataxia or problems with walking or coordination,  change in mood/affect or memory.        Current Meds  Medication Sig  . albuterol (PROVENTIL HFA;VENTOLIN HFA) 108 (90 BASE) MCG/ACT inhaler Inhale 2 puffs into the lungs every 6 (six) hours as needed for wheezing or shortness of breath.  Marland Kitchen. albuterol (PROVENTIL) (2.5 MG/3ML) 0.083% nebulizer solution 1 vial in neb every 4 hours as needed  .  budesonide-formoterol (SYMBICORT) 80-4.5 MCG/ACT inhaler Inhale 2 puffs into the lungs 2 (two) times daily.  . famotidine (PEPCID) 20 MG tablet One at bedtime  . fexofenadine (ALLEGRA) 180 MG tablet Take 180 mg by mouth daily.  . lansoprazole (PREVACID) 30 MG capsule Take 1 capsule (30 mg total) by mouth daily.  . Liraglutide -Weight Management (SAXENDA) 18 MG/3ML SOPN 1 injection daily  . montelukast (SINGULAIR) 10 MG tablet Take 1 tablet by mouth at bedtime.  Marland Kitchen. olmesartan-hydrochlorothiazide (BENICAR HCT) 40-25 MG tablet Take 1 tablet by mouth daily.             Objective:   Physical Exam  Obese bf harsh coughing and obvious pseudowheeze   05/15/2018       260   04/02/18 256 lb (116.1 kg)  10/08/13 240 lb (108.9 kg)  01/20/12 231 lb (104.8 kg)      Vital signs reviewed - Note on arrival 02 sats  100% on RA and bp 150/92         HEENT: nl dentition, turbinates bilaterally, and oropharynx. Nl external ear canals without cough reflex   NECK :  without JVD/Nodes/TM/ nl carotid upstrokes bilaterally   LUNGS: no acc muscle use,  Nl contour chest which is clear to A and P bilaterally without cough on insp or exp maneuvers   CV:  RRR  no s3 or murmur or increase in P2, and no edema   ABD:  Quite obese but soft and nontender with limited  inspiratory excursion in the supine position. No bruits or organomegaly appreciated, bowel sounds nl  MS:  Nl gait/ ext warm without deformities, calf tenderness, cyanosis or clubbing No obvious joint restrictions   SKIN: warm and dry without lesions    NEURO:  alert, approp, nl sensorium with  no motor or cerebellar deficits apparent.    Labs ordered 05/15/2018  Allergy profile     CXR PA and Lateral:   05/15/2018 :    I personally reviewed images and agree with radiology impression as follows:    The cardiac silhouette remains at mildly enlarged. The thoracic aorta is mildly tortuous. There is increased peribronchial thickening  compared to the prior study. No confluent airspace opacity, overt pulmonary edema, pleural effusion, or pneumothorax is identified. No acute osseous abnormality is seen     Assessment:

## 2018-05-15 NOTE — Patient Instructions (Addendum)
Stop symbicort and hfa albuterol for now   For breathing >>> Keep using the nebulizer up to every 4 hours if needed  For coughing >>> Take delsym two tsp every 12 hours and supplement if needed with  tramadol 50 mg up to 2 every 4 hours to suppress the urge to cough. Swallowing water and/or using ice chips/non mint and menthol containing candies (such as lifesavers or sugarless jolly ranchers) are also effective.  You should rest your voice and avoid activities that you know make you cough.  Once you have eliminated the cough for 3 straight days try reducing the tramadol first,  then the delsym as tolerated.     Stop prevacid and start protonix (pantoprazole) 40 mg  Take 30- 60 min before your first and last meals of the day   For drainage / throat tickle stop allegra and try  CHLORPHENIRAMINE  4 mg - take one every 4 hours as needed - available over the counter- may cause drowsiness so start with just a bedtime dose or two and see how you tolerate it before trying in daytime    Prednisone 10 mg take  4 each am x 2 days,   2 each am x 2 days,  1 each am x 2 days and stop    Please remember to go to the lab and x-ray department downstairs in the basement  for your tests - we will call you with the results when they are available.   Please schedule a follow up office visit in 2  weeks, sooner if needed  with all medications /inhalers/ solutions in hand so we can verify exactly what you are taking. This includes all medications from all doctors and over the counters

## 2018-05-15 NOTE — Progress Notes (Signed)
PFT completed today. 05/15/18 

## 2018-05-16 ENCOUNTER — Encounter: Payer: Self-pay | Admitting: Internal Medicine

## 2018-05-16 NOTE — Assessment & Plan Note (Addendum)
04/02/2018  After extensive coaching inhaler device  effectiveness =    75% from a baseline of 0 > continue symb 80 2bid - 04/02/2018 try off acei.  - PFT's  05/15/2018  FEV1 1.39 (62 % ) ratio 82     p albuterol per neb w/in 4 h  prior to study with DLCO  85 % corrects to 142  % for alv volume  And ERV 29% and very minimal curvature and active "wheeze" on exam during study   Symptoms are markedly disproportionate to objective findings and not clear to what extent this is actually a pulmonary  problem but pt does appear to have difficult to sort out respiratory symptoms of unknown origin for which  DDX  = almost all start with A and  include Adherence, Ace Inhibitors, Acid Reflux, Active Sinus Disease, Alpha 1 Antitripsin deficiency, Anxiety masquerading as Airways dz,  ABPA,  Allergy(esp in young), Aspiration (esp in elderly), Adverse effects of meds,  Active smokers, A bunch of PE's/clot burden (a few small clots can't cause this syndrome unless there is already severe underlying pulm or vascular dz with poor reserve),  Anemia or thyroid disorder, plus two Bs  = Bronchiectasis and Beta blocker use..and one C= CHF     Adherence is always the initial "prime suspect" and is a multilayered concern that requires a "trust but verify" approach in every patient - starting with knowing how to use medications, especially inhalers, correctly, keeping up with refills and understanding the fundamental difference between maintenance and prns vs those medications only taken for a very short course and then stopped and not refilled.  - return with all meds in hand using a trust but verify approach to confirm accurate Medication  Reconciliation The principal here is that until we are certain that the  patients are doing what we've asked, it makes no sense to ask them to do more.   ? Acid (or non-acid) GERD > always difficult to exclude as up to 75% of pts in some series report no assoc GI/ Heartburn symptoms> rec max (24h)   acid suppression and diet restrictions/ reviewed and instructions given in writing. - Of the three most common causes of  Sub-acute / recurrent or chronic cough, only one (GERD)  can actually contribute to/ trigger  the other two (asthma and post nasal drip syndrome)  and perpetuate the cylce of cough.  While not intuitively obvious, many patients with chronic low grade reflux do not cough until there is a primary insult that disturbs the protective epithelial barrier and exposes sensitive nerve endings.   This is typically viral but can due to PNDS and  either may apply here.   The point is that once this occurs, it is difficult to eliminate the cycle  using anything but a maximally effective acid suppression regimen at least in the short run, accompanied by an appropriate diet to address non acid GERD and control / eliminate pnds with  1st gen H1 blockers per guidelines  And eliminate  the cough itself for at least 3 days with tramadol  - see avs for instructions unique to this ov     ? ACEi effects/ residual > documented she changed to ARB 6 weeks prior to OV  So should remain off for now - see hbp  ? Allergy > send profile/ continue singulair but just use nebs and not ICS since this may aggravate uacs   ? Active sinus dz > consider sinus CT next  ?  Anxiety / VCD  > usually at the bottom of this list of usual suspects but should be   higher on this pt's based on H and P and may interfere with adherence and also interpretation of response or lack thereof to symptom management which can be quite subjective.   I had an extended discussion with the patient reviewing all relevant studies completed to date and  lasting 25 minutes of a 40  minute office visit addressing  severe non-specific but potentially very serious refractory respiratory symptoms of uncertain and potentially multiple  etiologies.   Each maintenance medication was reviewed in detail including most importantly the difference between  maintenance and prns and under what circumstances the prns are to be triggered using an action plan format that is not reflected in the computer generated alphabetically organized AVS.    Please see AVS for specific instructions unique to this office visit that I personally wrote and verbalized to the the pt in detail and then reviewed with pt  by my nurse highlighting any changes in therapy/plan of care  recommended at today's visit.

## 2018-05-16 NOTE — Assessment & Plan Note (Signed)
Body mass index is 46.2 kg/m.  -  trending up still  No results found for: TSH   Contributing to gerd risk/ doe/reviewed the need and the process to achieve and maintain neg calorie balance > defer f/u primary care including intermittently monitoring thyroid status

## 2018-05-16 NOTE — Assessment & Plan Note (Signed)
D/c acei 04/02/2018 due to cough   Although even in retrospect it may not be clear the ACEi contributed to the pt's symptoms,  Pt improved off them and adding them back at this point or in the future would risk confusion in interpretation of non-specific respiratory symptoms to which this patient is prone  ie  Better not to muddy the waters here.

## 2018-05-18 LAB — RESPIRATORY ALLERGY PROFILE REGION II ~~LOC~~
Allergen, A. alternata, m6: <0.1 kU/L
Allergen, Cedar tree, t12: <0.1 kU/L
Allergen, Comm Silver Birch, t9: <0.1 kU/L
Allergen, Cottonwood, t14: <0.1 kU/L
Allergen, Mulberry, t76: <0.1 kU/L
Allergen, P. notatum, m1: <0.1 kU/L
Bermuda Grass: <0.1 kU/L
CLADOSPORIUM HERBARUM (M2) IGE: <0.1 kU/L
CLASS: 0
CLASS: 0
CLASS: 0
CLASS: 0
CLASS: 0
CLASS: 0
CLASS: 0
CLASS: 0
CLASS: 0
COMMON RAGWEED (SHORT) (W1) IGE: <0.1 kU/L
Cat Dander: <0.1 kU/L
Class: 0
Class: 0
Class: 0
Class: 0
Class: 0
Class: 0
Class: 0
Class: 0
Class: 0
Class: 0
Class: 0
Class: 0
Class: 0
Class: 0
Class: 0
Dog Dander: <0.1 kU/L
IgE (Immunoglobulin E), Serum: 362 kU/L — ABNORMAL HIGH (ref <=114)
Pecan/Hickory Tree IgE: <0.1 kU/L
Sheep Sorrel IgE: <0.1 kU/L

## 2018-05-18 LAB — INTERPRETATION:

## 2018-05-18 NOTE — Progress Notes (Signed)
Spoke with pt and notified of results per Dr. Wert. Pt verbalized understanding and denied any questions. 

## 2018-05-18 NOTE — Progress Notes (Signed)
LMTCB

## 2018-05-19 ENCOUNTER — Telehealth: Payer: Self-pay | Admitting: Internal Medicine

## 2018-05-19 NOTE — Telephone Encounter (Signed)
Spoke with pt and advised of lab results per Dr Sherene SiresWert:  Notes recorded by Nyoka CowdenWert, Michael B, MD on 05/18/2018 at 4:24 PM EDT Call patient :  Studies do not suggest any specific allergy trigger to her symptoms but there still may be one we have not identified.  Be sure patient has/keeps f/u ov so we can go over all the details of this study and get a plan together moving forward - ok to move up f/u if not feeling better and wants to be seen sooner - alternative is formal allergy referral to [facility]   PT verbalized understanding.  PT would like to move appt up with Dr Sherene SiresWert to discuss labs.  PT will call back towards end of the week to see if appt has come open sooner than her current appt on 06/08/18.

## 2018-06-01 ENCOUNTER — Ambulatory Visit: Payer: BC Managed Care – PPO | Admitting: Internal Medicine

## 2018-06-01 ENCOUNTER — Encounter: Payer: Self-pay | Admitting: Internal Medicine

## 2018-06-01 VITALS — BP 134/80 | HR 101 | Temp 98.1°F | Ht 63.0 in | Wt 252.0 lb

## 2018-06-01 DIAGNOSIS — I10 Essential (primary) hypertension: Secondary | ICD-10-CM | POA: Diagnosis not present

## 2018-06-01 DIAGNOSIS — J45991 Cough variant asthma: Secondary | ICD-10-CM | POA: Diagnosis not present

## 2018-06-01 DIAGNOSIS — Z6841 Body Mass Index (BMI) 40.0 and over, adult: Secondary | ICD-10-CM

## 2018-06-01 LAB — NITRIC OXIDE: NITRIC OXIDE: 74

## 2018-06-01 MED ORDER — BUDESONIDE-FORMOTEROL FUMARATE 80-4.5 MCG/ACT IN AERO: 2.0000 | INHALATION_SPRAY | Freq: Two times a day (BID) | RESPIRATORY_TRACT | 0 refills | Status: DC

## 2018-06-01 MED ORDER — PREDNISONE 10 MG PO TABS: ORAL_TABLET | ORAL | 0 refills | Status: DC

## 2018-06-01 MED ORDER — TRAMADOL HCL 50 MG PO TABS: 50.0000 mg | ORAL_TABLET | ORAL | 0 refills | Status: AC | PRN

## 2018-06-01 MED ORDER — MONTELUKAST SODIUM 10 MG PO TABS: 10.0000 mg | ORAL_TABLET | Freq: Every day | ORAL | 11 refills | Status: DC

## 2018-06-01 NOTE — Patient Instructions (Addendum)
Restart singulair 10 mg daily and Symbicort 80 Take 2 puffs first thing in am and then another 2 puffs about 12 hours later.    Work on inhaler technique:  relax and gently blow all the way out then take a nice smooth deep breath back in, triggering the inhaler at same time you start breathing in.  Hold for up to 5 seconds if you can. Blow out thru nose. Rinse and gargle with water when done        For breathing >>> Keep using the nebulizer up to every 4 hours if needed  For coughing >>> Take delsym two tsp every 12 hours and supplement if needed with  tramadol 50 mg up to 2 every 4 hours to suppress the urge to cough. Swallowing water and/or using ice chips/non mint and menthol containing candies (such as lifesavers or sugarless jolly ranchers) are also effective.  You should rest your voice and avoid activities that you know make you cough.  Once you have eliminated the cough for 3 straight days try reducing the tramadol first,  then the delsym as tolerated.     Stop prevacid and start protonix (pantoprazole) 40 mg  Take 30- 60 min before your first and last meals of the day   For drainage / throat tickle stop allegra and try  CHLORPHENIRAMINE  4 mg - take one every 4 hours as needed - available over the counter- may cause drowsiness so start with just a bedtime dose or two and see how you tolerate it before trying in daytime    Prednisone 10 mg take  4 each am x 2 days,   2 each am x 2 days,  1 each am x 2 days and stop    See Tammy NP w/in 2 weeks with all your medications, even over the counter meds, separated in two separate bags, the ones you take no matter what vs the ones you stop once you feel better and take only as needed when you feel you need them.   Tammy  will generate for you a new user friendly medication calendar that will put us all on the same page re: your medication use.     Without this process, it simply isn't possible to assure that we are providing  your outpatient  care  with  the attention to detail we feel you deserve.   If we cannot assure that you're getting that kind of care,  then we cannot manage your problem effectively from this clinic.  Once you have seen Tammy and we are sure that we're all on the same page with your medication use she will arrange follow up with me.

## 2018-06-01 NOTE — Progress Notes (Signed)
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    History of Present Illness  04/02/2018 1st [facility] office visit/ Jessica Snow  Re cough on ACEi  - not right since 2012  Chief Complaint  Patient presents with  . Pulmonary Consult    Referred by Beatrix FettersSylvia Southworth, PA.  Pt c/o SOB for the past month.  She states she sometimes gets winded just walking short distances.  She occ has SOB when she lies down. She also c/o cough with clear sputum- worse at night and wakes her up.  She uses her albuterol inhaler and neb both 1-2 x daily on average.   cough wax and wanes x 6 months 24/7 with freq throat clearing and noct exacerbation  symbicort 80 no better  pred no better  was able to treadmill x two years prior to OV  Now doe x more than room to room and immediate in supine position  rec Stop lisinopril and start benicar 40-25 one daily and your cough should improve Continue Prevacid 30 mg  Take 30-60 min before first meal of the day and add add pepcid 20 mg one hour before bedtime  GERD diet   Please schedule a follow up office visit in 6 weeks, call sooner if needed with all medications /inhalers/ solutions in hand so we can verify exactly what you are taking. This includes all medications from all doctors and over the counters and PFT's on return    Add: have her come back in 1 week for bp check and bmet to see Jessica Snow     05/15/2018  f/u ov/Jessica Snow re: uacs/ brought most of her meds/ no prevacid  Chief Complaint  Patient presents with  . Follow-up    PFT today,  shortness of breath, productive cough (yellow), frequent headaches  Dyspnea:  MMRC3 = can't walk 100 yards even at a slow pace at a flat grade s stopping due to sob   Cough: 24/7  Min yellow mucus Sleeping: R side down/ 4-5 pillows SABA use: neb and saba  rec Stop symbicort and hfa albuterol for now  For breathing >>> Keep using the nebulizer up to every 4 hours if needed For coughing >>> Take delsym two tsp every 12 hours and supplement if needed with  tramadol 50 mg up to 2 every 4 hours to suppress the urge to cough  - stopped p 3 days    Stop prevacid and start protonix (pantoprazole) 40 mg  Take 30- 60 min before your first and last meals of the day  For drainage / throat tickle stop allegra and try  CHLORPHENIRAMINE  4 mg - take one every 4 hours as needed - available over the counter- may cause drowsiness so start with just a bedtime dose or two and see how you tolerate it before trying in daytime  - took one each am  Prednisone 10 mg take  4 each am x 2 days,   2 each am x 2 days,  1 each am x 2 days and stop  Please schedule a follow up office visit in 2  weeks, sooner if needed  with all medications /inhalers/ solutions in hand so we can verify exactly what you are taking. This includes all medications from all doctors and over the counters- did not bring all meds      06/01/2018 acute extended ov/Jessica Snow re:  Chief Complaint  Patient presents with  . Acute Visit    wheezing, increased SOB, chest soreness and cough with white to yellow sputum x 5 days.   see above med errors.  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  No obvious day to day or daytime variability or assoc excess/ purulent sputum or mucus plugs or hemoptysis or cp or chest tightness, subjective wheeze or overt sinus or hb  symptoms.    Also denies any obvious fluctuation of symptoms with weather or environmental changes or other aggravating or alleviating factors except as outlined above   No unusual exposure hx or h/o childhood pna/ asthma or knowledge of premature birth.  Current Allergies, Complete Past Medical History, Past Surgical History, Family History, and Social History were reviewed in [facility] electronic medical record.  ROS  The following are not active complaints unless bolded Hoarseness, sore throat, dysphagia, dental problems, itching, sneezing,  nasal congestion or discharge of excess mucus or purulent secretions, ear ache,   fever, chills, sweats, unintended wt loss or wt gain, classically pleuritic or exertional cp,  orthopnea pnd or arm/hand swelling  or leg swelling, presyncope, palpitations, abdominal pain, anorexia, nausea, vomiting, diarrhea  or change in bowel habits or change in bladder habits, change in stools or change in urine, dysuria, hematuria,  rash, arthralgias, visual complaints, headache, numbness, weakness or ataxia or problems with walking or coordination,  change in mood or  memory.        Current Meds  Medication Sig  . albuterol (PROVENTIL) (2.5 MG/3ML) 0.083% nebulizer solution 1 vial in neb every 4 hours as needed  . famotidine (PEPCID) 20 MG tablet One at bedtime  . Liraglutide -Weight Management (SAXENDA) 18 MG/3ML SOPN 1 injection daily  . olmesartan-hydrochlorothiazide (BENICAR HCT) 40-25 MG tablet Take 1 tablet by mouth daily.  . pantoprazole (PROTONIX) 40 MG tablet Take 30- 60 min before your first and last meals of the day          .             Objective:   Physical Exam  Obese bf harsh upper airway cough and wheezing   06/01/2018         252  05/15/2018       260   04/02/18 256 lb (116.1 kg)  10/08/13 240 lb (108.9 kg)  01/20/12 231 lb (104.8 kg)     Vital signs reviewed - Note on arrival 02 sats  93% on RA   HEENT: nl dentition,  turbinates bilaterally, and oropharynx. Nl external ear canals without cough reflex   NECK :  without JVD/Nodes/TM/ nl carotid upstrokes bilaterally   LUNGS: no acc muscle use,  Nl contour chest with insp  / exp wheezing both upper and lower   CV:  RRR  no s3 or murmur or increase in P2, and no edema   ABD: obese/  soft and nontender with nl inspiratory excursion in the supine position. No bruits or organomegaly appreciated, bowel sounds nl  MS:  Nl gait/ ext warm without deformities, calf tenderness, cyanosis or clubbing No obvious joint restrictions   SKIN: warm and dry without lesions    NEURO:  alert, approp, nl sensorium with  no motor or cerebellar deficits apparent.            Assessment:

## 2018-06-02 ENCOUNTER — Encounter: Payer: Self-pay | Admitting: Internal Medicine

## 2018-06-02 NOTE — Assessment & Plan Note (Signed)
Adequate control on present rx, reviewed in detail with pt > no change in rx needed  > remain off acei

## 2018-06-02 NOTE — Assessment & Plan Note (Addendum)
04/02/2018  After extensive coaching inhaler device  effectiveness =    75% from a baseline of 0 > continue symb 80 2bid - 04/02/2018 try off acei.  - PFT's  05/15/2018  FEV1 1.39 (62 % ) ratio 82     p albuterol per neb w/in 4 h  prior to study with DLCO  85 % corrects to 142  % for alv volume  And ERV 29% and very minimal curvature and active "wheeze" on exam during study  - Allergy profile 05/15/18  >  Eos 0.9/  IgE  362 RAST neg  - FENO 06/01/2018  =   74  - Spirometry 06/01/2018  FEV1 0.65 (29%)  Ratio 73 with mild curvature     She clearly has an asthmatic component though much of her spirometry is resctrictive c/w obesity and much of her cough has an upper airway quality >   DDX of  difficult airways management almost all start with A and  include Adherence, Ace Inhibitors, Acid Reflux, Active Sinus Disease, Alpha 1 Antitripsin deficiency, Anxiety masquerading as Airways dz,  ABPA,  Allergy(esp in young), Aspiration (esp in elderly), Adverse effects of meds,  Active smokers, A bunch of PE's (a small clot burden can't cause this syndrome unless there is already severe underlying pulm or vascular dz with poor reserve) plus two Bs  = Bronchiectasis and Beta blocker use..and one C= CHF    In this case Adherence is the biggest issue and starts with  inability to use HFA effectively and also  understand that SABA treats the symptoms but doesn't get to the underlying problem (inflammation).  I used  the analogy of putting steroid cream on a rash to help explain the meaning of topical therapy and the need to get the drug to the target tissue.   - - The proper method of use, as well as anticipated side effects, of a metered-dose inhaler are discussed and demonstrated to the patient. Improved effectiveness after extensive coaching during this visit to a level of approximately 75 % from a baseline of 25 % > try back on symbicort 80 2bid  - return with all meds in hand using a trust but verify approach to confirm  accurate Medication  Reconciliation The principal here is that until we are certain that the  patients are doing what we've asked, it makes no sense to ask them to do more.    ? Allergy > restart singulair 10 mg daily / Prednisone 10 mg take  4 each am x 2 days,   2 each am x 2 days,  1 each am x 2 days and stop / keep ICS low for now due to concerns with uacs   ? ACEi effects - off now x 2 months should not be a concern with benicar    ? Acid (or non-acid) GERD > always difficult to exclude as up to 75% of pts in some series report no assoc GI/ Heartburn symptoms> rec continue max (24h)  acid suppression and diet restrictions/ reviewed and instructions given in writing.   - Of the three most common causes of  Sub-acute / recurrent or chronic cough, only one (GERD)  can actually contribute to/ trigger  the other two (asthma and post nasal drip syndrome)  and perpetuate the cylce of cough.  While not intuitively obvious, many patients with chronic low grade reflux do not cough until there is a primary insult that disturbs the protective epithelial barrier and exposes sensitive  nerve endings.   This is typically viral but can due to PNDS and  either may apply here.   The point is that once this occurs, it is difficult to eliminate the cycle  using anything but a maximally effective acid suppression regimen at least in the short run, accompanied by an appropriate diet to address non acid GERD and control / eliminate the cough itself for at least 3 days with tramadol and eliminate pnds with 1st gen H1 blockers per guidelines .  ? Anxiety/depression  > usually at the bottom of this list of usual suspects but should be on this pt's based on H and P and   and may interfere with adherence and also interpretation of response or lack thereof to symptom management which can be quite subjective.     I had an extended discussion with the patient reviewing all relevant studies completed to date and  lasting 25  minutes of a 40  minute acute office  visit  Addressing   severe non-specific and  potentially very serious refractory respiratory symptoms of uncertain and potentially multiple  etiologies.   Each maintenance medication was reviewed in detail including most importantly the difference between maintenance and prns and under what circumstances the prns are to be triggered using an action plan format that is not reflected in the computer generated alphabetically organized AVS.    Please see AVS for specific instructions unique to this office visit that I personally wrote and verbalized to the the pt in detail and then reviewed with pt  by my nurse highlighting any changes in therapy/plan of care  recommended at today's visit.

## 2018-06-02 NOTE — Assessment & Plan Note (Signed)
Body mass index is 44.64 kg/m.  -  trending down/ encouraged  No results found for: TSH   Contributing to gerd risk/ doe/reviewed the need and the process to achieve and maintain neg calorie balance > defer f/u primary care including intermittently monitoring thyroid status

## 2018-06-08 ENCOUNTER — Ambulatory Visit: Payer: Self-pay | Admitting: Internal Medicine

## 2018-06-18 ENCOUNTER — Encounter: Payer: Self-pay | Admitting: Adult Health

## 2018-07-13 ENCOUNTER — Telehealth: Payer: Self-pay | Admitting: Internal Medicine

## 2018-07-13 MED ORDER — BUDESONIDE-FORMOTEROL FUMARATE 80-4.5 MCG/ACT IN AERO: 2.0000 | INHALATION_SPRAY | Freq: Two times a day (BID) | RESPIRATORY_TRACT | 11 refills | Status: DC

## 2018-07-13 NOTE — Telephone Encounter (Signed)
Spoke with the pt She is asking for refill on symbicort  Rx was sent  I have scheduled her med cal with TP since this has not been done yet  Nothing further needed

## 2018-07-16 ENCOUNTER — Encounter: Payer: Self-pay | Admitting: Adult Health

## 2018-07-16 ENCOUNTER — Ambulatory Visit (INDEPENDENT_AMBULATORY_CARE_PROVIDER_SITE_OTHER): Payer: BC Managed Care – PPO | Admitting: Adult Health

## 2018-07-16 DIAGNOSIS — Z23 Encounter for immunization: Secondary | ICD-10-CM | POA: Diagnosis not present

## 2018-07-16 DIAGNOSIS — K219 Gastro-esophageal reflux disease without esophagitis: Secondary | ICD-10-CM

## 2018-07-16 DIAGNOSIS — J45991 Cough variant asthma: Secondary | ICD-10-CM

## 2018-07-16 DIAGNOSIS — J310 Chronic rhinitis: Secondary | ICD-10-CM

## 2018-07-16 MED ORDER — BENZONATATE 200 MG PO CAPS: 200.0000 mg | ORAL_CAPSULE | Freq: Three times a day (TID) | ORAL | 3 refills | Status: DC | PRN

## 2018-07-16 NOTE — Progress Notes (Signed)
@Patient  ID: Jessica Snow, female    DOB: Oct 04, 1966, 52 y.o.   MRN: [id number]  Chief Complaint  Patient presents with  . Follow-up    Cough     Referring provider: Dorothyann Peng, MD  HPI: 52 year old female former smoker, quit April 2019 seen for pulmonary consult June 2019 for cough, questionable ACE inhibitor related cough plus or minus cough variant asthma  TEST  PFT's  05/15/2018  FEV1 1.39 (62 % ) ratio 82     p albuterol per neb w/in 4 h  prior to study with DLCO  85 % corrects to 142  % for alv volume  And ERV 29% and very minimal curvature and active "wheeze" on exam during study  - Allergy profile 05/15/18  >  Eos 0.9/  IgE  362 RAST neg  - FENO 06/01/2018  =   74  - Spirometry 06/01/2018  FEV1 0.65 (29%)  Ratio 73 with mild curvature    07/16/2018 Follow up: Cough variant Asthma  Patient returns for a one-month follow-up.  Patient was seen for pulmonary consult June 2019 for ongoing cough.  She was felt to have a possible ace inhibitor related cough.  Her lisinopril was stopped.  She was changed to Benicar.  Patient continued to have symptoms of cough and wheezing.  PFTs showed moderate restriction with no airflow obstruction.  Allergy profile was negative.  IgE was elevated at 362.  Eosinophils were high at 900.  Exhaled nitric oxide testing was elevated at 74.  Last visit patient was recommended to restart Singulair daily.  Take Symbicort 2 puffs twice daily.  Use Delsym and tramadol for cough.  She was treated for possible GERD component with Protonix twice daily.  And a possible chronic rhinitis component with Chlortab's.  She was given a prednisone taper. She is feeling some better.  Wheezing and cough are decreased about 50%.  GERD is a lot better on Protonix and Pepcid.  Decreased albuterol use.    No Known Allergies  Immunization History  Administered Date(s) Administered  . Influenza,inj,Quad PF,6+ Mos 07/16/2018  . Influenza-Unspecified 08/10/2017    Past  Medical History:  Diagnosis Date  . Asthma   . HTN (hypertension)   . Pneumonia     Tobacco History: Social History   Tobacco Use  Smoking Status Former Smoker  . Packs/day: 0.25  . Years: 10.00  . Pack years: 2.50  . Types: Cigarettes  . Last attempt to quit: 01/26/2018  . Years since quitting: 0.4  Smokeless Tobacco Never Used   Counseling given: Not Answered   Outpatient Medications Prior to Visit  Medication Sig Dispense Refill  . albuterol (PROVENTIL) (2.5 MG/3ML) 0.083% nebulizer solution 1 vial in neb every 4 hours as needed    . budesonide-formoterol (SYMBICORT) 80-4.5 MCG/ACT inhaler Inhale 2 puffs into the lungs 2 (two) times daily. 1 Inhaler 11  . famotidine (PEPCID) 20 MG tablet One at bedtime 30 tablet 11  . Liraglutide -Weight Management (SAXENDA) 18 MG/3ML SOPN 1 injection daily    . montelukast (SINGULAIR) 10 MG tablet Take 1 tablet (10 mg total) by mouth at bedtime. 30 tablet 11  . olmesartan-hydrochlorothiazide (BENICAR HCT) 40-25 MG tablet Take 1 tablet by mouth daily. 30 tablet 11  . pantoprazole (PROTONIX) 40 MG tablet Take 30- 60 min before your first and last meals of the day 60 tablet 2  . predniSONE (DELTASONE) 10 MG tablet Take as directed 100 tablet 0   No facility-administered medications  prior to visit.      Review of Systems  Constitutional:   No  weight loss, night sweats,  Fevers, chills, fatigue, or  lassitude.  HEENT:   No headaches,  Difficulty swallowing,  Tooth/dental problems, or  Sore throat,                No sneezing, itching, ear ache,  +nasal congestion, post nasal drip,   CV:  No chest pain,  Orthopnea, PND, swelling in lower extremities, anasarca, dizziness, palpitations, syncope.   GI  No heartburn, indigestion, abdominal pain, nausea, vomiting, diarrhea, change in bowel habits, loss of appetite, bloody stools.   Resp:    No chest wall deformity  Skin: no rash or lesions.  GU: no dysuria, change in color of urine, no  urgency or frequency.  No flank pain, no hematuria   MS:  No joint pain or swelling.  No decreased range of motion.  No back pain.    Physical Exam  BP 136/90 (BP Location: Left Arm, Cuff Size: Normal)   Pulse 79   Ht 5' 2.99" (1.6 m)   Wt 257 lb (116.6 kg)   LMP 12/19/2005   SpO2 97%   BMI 45.54 kg/m   GEN: A/Ox3; pleasant , NAD, obese    HEENT:  /AT,  EACs-clear, TMs-wnl, NOSE-bilateral congestion , turbinate edema , THROAT-clear, no lesions, no postnasal drip or exudate noted. Braces   NECK:  Supple w/ fair ROM; no JVD; normal carotid impulses w/o bruits; no thyromegaly or nodules palpated; no lymphadenopathy.    RESP  Clear  P & A; w/o, wheezes/ rales/ or rhonchi. no accessory muscle use, no dullness to percussion  CARD:  RRR, no m/r/g, no peripheral edema, pulses intact, no cyanosis or clubbing.  GI:   Soft & nt; nml bowel sounds; no organomegaly or masses detected.   Musco: Warm bil, no deformities or joint swelling noted.   Neuro: alert, no focal deficits noted.    Skin: Warm, no lesions or rashes    Lab Results:  CBC    Component Value Date/Time   WBC 7.3 05/15/2018 1113   RBC 4.95 05/15/2018 1113   HGB 12.4 05/15/2018 1113   HCT 38.8 05/15/2018 1113   PLT 242.0 05/15/2018 1113   MCV 78.3 05/15/2018 1113   MCV 81.2 12/20/2011 1710   MCH 26.6 10/08/2013 0953   MCHC 32.0 05/15/2018 1113   RDW 16.2 (H) 05/15/2018 1113   LYMPHSABS 2.8 05/15/2018 1113   MONOABS 0.5 05/15/2018 1113   EOSABS 0.9 (H) 05/15/2018 1113   BASOSABS 0.1 05/15/2018 1113    BMET    Component Value Date/Time   NA 141 04/10/2018 1419   K 3.7 04/10/2018 1419   CL 103 04/10/2018 1419   CO2 30 04/10/2018 1419   GLUCOSE 118 (H) 04/10/2018 1419   BUN 17 04/10/2018 1419   CREATININE 0.75 04/10/2018 1419   CREATININE 0.66 12/20/2011 1710   CALCIUM 9.6 04/10/2018 1419   GFRNONAA >90 10/08/2013 0953   GFRAA >90 10/08/2013 0953    BNP  Imaging: No results  found.   Assessment & Plan:   Cough variant asthma vs uacs  Asthma with upper airway cough - aggravated by GERD /AR . Previous ACE most likely had negative impact as well  She is clinically improved with Symbicort , Singulair and tx aimed at GERD/AR/Cough prevention   Plan  Patient Instructions  Flu shot today .  Begin Flonase 2 puffs each nostril daily .  Use Delsym 2 tsp Twice daily  For cough As needed   May use Tessalon Three times a day  For cough As needed   Continue on Symbicort 2 puffs Twice daily  , rinse after use.  Follow med calendar closely and bring to each visit.  Follow up with Dr. Sherene SiresWert  In 2 months and As needed   Please contact office for sooner follow up if symptoms do not improve or worsen or seek emergency care       Gastroesophageal reflux disease Cont on GERD diet  PPI /pepcid   Chronic rhinitis Add Flonase . Cont on Singulair and Chlortabs      Johndaniel Catlin, NP 07/16/2018

## 2018-07-16 NOTE — Assessment & Plan Note (Signed)
Add Flonase . Cont on Singulair and Chlortabs

## 2018-07-16 NOTE — Assessment & Plan Note (Signed)
Cont on GERD diet  PPI /pepcid

## 2018-07-16 NOTE — Patient Instructions (Signed)
Flu shot today .  Begin Flonase 2 puffs each nostril daily .  Use Delsym 2 tsp Twice daily  For cough As needed   May use Tessalon Three times a day  For cough As needed   Continue on Symbicort 2 puffs Twice daily  , rinse after use.  Follow med calendar closely and bring to each visit.  Follow up with Dr. Sherene SiresWert  In 2 months and As needed   Please contact office for sooner follow up if symptoms do not improve or worsen or seek emergency care

## 2018-07-16 NOTE — Assessment & Plan Note (Signed)
Asthma with upper airway cough - aggravated by GERD /AR . Previous ACE most likely had negative impact as well  She is clinically improved with Symbicort , Singulair and tx aimed at GERD/AR/Cough prevention   Plan  Patient Instructions  Flu shot today .  Begin Flonase 2 puffs each nostril daily .  Use Delsym 2 tsp Twice daily  For cough As needed   May use Tessalon Three times a day  For cough As needed   Continue on Symbicort 2 puffs Twice daily  , rinse after use.  Follow med calendar closely and bring to each visit.  Follow up with Dr. Sherene SiresWert  In 2 months and As needed   Please contact office for sooner follow up if symptoms do not improve or worsen or seek emergency care

## 2018-07-17 NOTE — Addendum Note (Signed)
Addended by: Etheleen MayhewOX, HEATHER C on: 07/17/2018 04:44 PM   Modules accepted: Orders

## 2018-07-19 NOTE — Progress Notes (Signed)
Chart and office note reviewed in detail  > agree with a/p as outlined    

## 2018-07-20 DIAGNOSIS — Z6841 Body Mass Index (BMI) 40.0 and over, adult: Secondary | ICD-10-CM

## 2018-07-20 DIAGNOSIS — J45909 Unspecified asthma, uncomplicated: Secondary | ICD-10-CM

## 2018-07-20 DIAGNOSIS — I10 Essential (primary) hypertension: Secondary | ICD-10-CM

## 2018-07-20 DIAGNOSIS — Z7189 Other specified counseling: Secondary | ICD-10-CM

## 2018-07-20 DIAGNOSIS — Z720 Tobacco use: Secondary | ICD-10-CM

## 2018-07-21 LAB — HM COLONOSCOPY

## 2018-08-07 ENCOUNTER — Other Ambulatory Visit: Payer: Self-pay | Admitting: Internal Medicine

## 2018-08-18 ENCOUNTER — Ambulatory Visit: Payer: BC Managed Care – PPO | Admitting: Internal Medicine

## 2018-08-18 ENCOUNTER — Encounter: Payer: Self-pay | Admitting: Internal Medicine

## 2018-08-18 ENCOUNTER — Ambulatory Visit
Admission: RE | Admit: 2018-08-18 | Discharge: 2018-08-18 | Disposition: A | Discharge status: Home / Self Care | Payer: BC Managed Care – PPO | Source: Ambulatory Visit | Attending: Internal Medicine | Admitting: Internal Medicine

## 2018-08-18 ENCOUNTER — Telehealth: Payer: Self-pay | Admitting: Internal Medicine

## 2018-08-18 VITALS — BP 130/80 | HR 81 | Temp 97.9°F | Ht 62.5 in | Wt 259.2 lb

## 2018-08-18 DIAGNOSIS — R1012 Left upper quadrant pain: Secondary | ICD-10-CM

## 2018-08-18 DIAGNOSIS — R0781 Pleurodynia: Secondary | ICD-10-CM

## 2018-08-18 DIAGNOSIS — J4541 Moderate persistent asthma with (acute) exacerbation: Secondary | ICD-10-CM

## 2018-08-18 DIAGNOSIS — R0989 Other specified symptoms and signs involving the circulatory and respiratory systems: Secondary | ICD-10-CM

## 2018-08-18 DIAGNOSIS — I517 Cardiomegaly: Secondary | ICD-10-CM

## 2018-08-18 LAB — CMP14 + ANION GAP
ALT: 37 IU/L — AB (ref 0–32)
ANION GAP: 14 mmol/L (ref 10.0–18.0)
AST: 24 IU/L (ref 0–40)
Albumin/Globulin Ratio: 1.4 (ref 1.2–2.2)
Albumin: 4.2 g/dL (ref 3.5–5.5)
Alkaline Phosphatase: 93 IU/L (ref 39–117)
BUN/Creatinine Ratio: 15 (ref 9–23)
BUN: 11 mg/dL (ref 6–24)
Bilirubin Total: 0.4 mg/dL (ref 0.0–1.2)
CALCIUM: 9.2 mg/dL (ref 8.7–10.2)
CO2: 25 mmol/L (ref 20–29)
CREATININE: 0.72 mg/dL (ref 0.57–1.00)
Chloride: 104 mmol/L (ref 96–106)
GFR calc Af Amer: 113 mL/min/{1.73_m2} (ref >59)
GFR, EST NON AFRICAN AMERICAN: 98 mL/min/{1.73_m2} (ref >59)
GLUCOSE: 95 mg/dL (ref 65–99)
Globulin, Total: 2.9 g/dL (ref 1.5–4.5)
Potassium: 4.5 mmol/L (ref 3.5–5.2)
Sodium: 143 mmol/L (ref 134–144)
Total Protein: 7.1 g/dL (ref 6.0–8.5)

## 2018-08-18 LAB — CBC WITH DIFFERENTIAL/PLATELET
BASOS ABS: 0 10*3/uL (ref 0.0–0.2)
BASOS: 0 %
EOS (ABSOLUTE): 0.4 10*3/uL (ref 0.0–0.4)
Eos: 5 %
Hematocrit: 37.8 % (ref 34.0–46.6)
Hemoglobin: 12.4 g/dL (ref 11.1–15.9)
IMMATURE GRANS (ABS): 0 10*3/uL (ref 0.0–0.1)
IMMATURE GRANULOCYTES: 0 %
LYMPHS: 33 %
Lymphocytes Absolute: 2.6 10*3/uL (ref 0.7–3.1)
MCH: 25.9 pg — ABNORMAL LOW (ref 26.6–33.0)
MCHC: 32.8 g/dL (ref 31.5–35.7)
MCV: 79 fL (ref 79–97)
Monocytes Absolute: 0.4 10*3/uL (ref 0.1–0.9)
Monocytes: 5 %
NEUTROS PCT: 57 %
Neutrophils Absolute: 4.5 10*3/uL (ref 1.4–7.0)
PLATELETS: 277 10*3/uL (ref 150–450)
RBC: 4.79 x10E6/uL (ref 3.77–5.28)
RDW: 15.9 % — AB (ref 12.3–15.4)
WBC: 7.9 10*3/uL (ref 3.4–10.8)

## 2018-08-18 LAB — LIPASE: LIPASE: 23 U/L (ref 14–72)

## 2018-08-18 LAB — AMYLASE: Amylase: 71 U/L (ref 31–124)

## 2018-08-18 IMAGING — DX DG RIBS 2V*L*
3 series · 3 of 3 positions shown · non-contrast
Comparison: [DATE] chest radiograph.

CLINICAL DATA: 52 y/o F; left lower anterior, lateral, and
posterior rib pain for 3-4 days. Recent cough. Pain while coughing.

EXAM:
LEFT RIBS - 2 VIEW

[dg ribs unilateral left (1 of 3)]
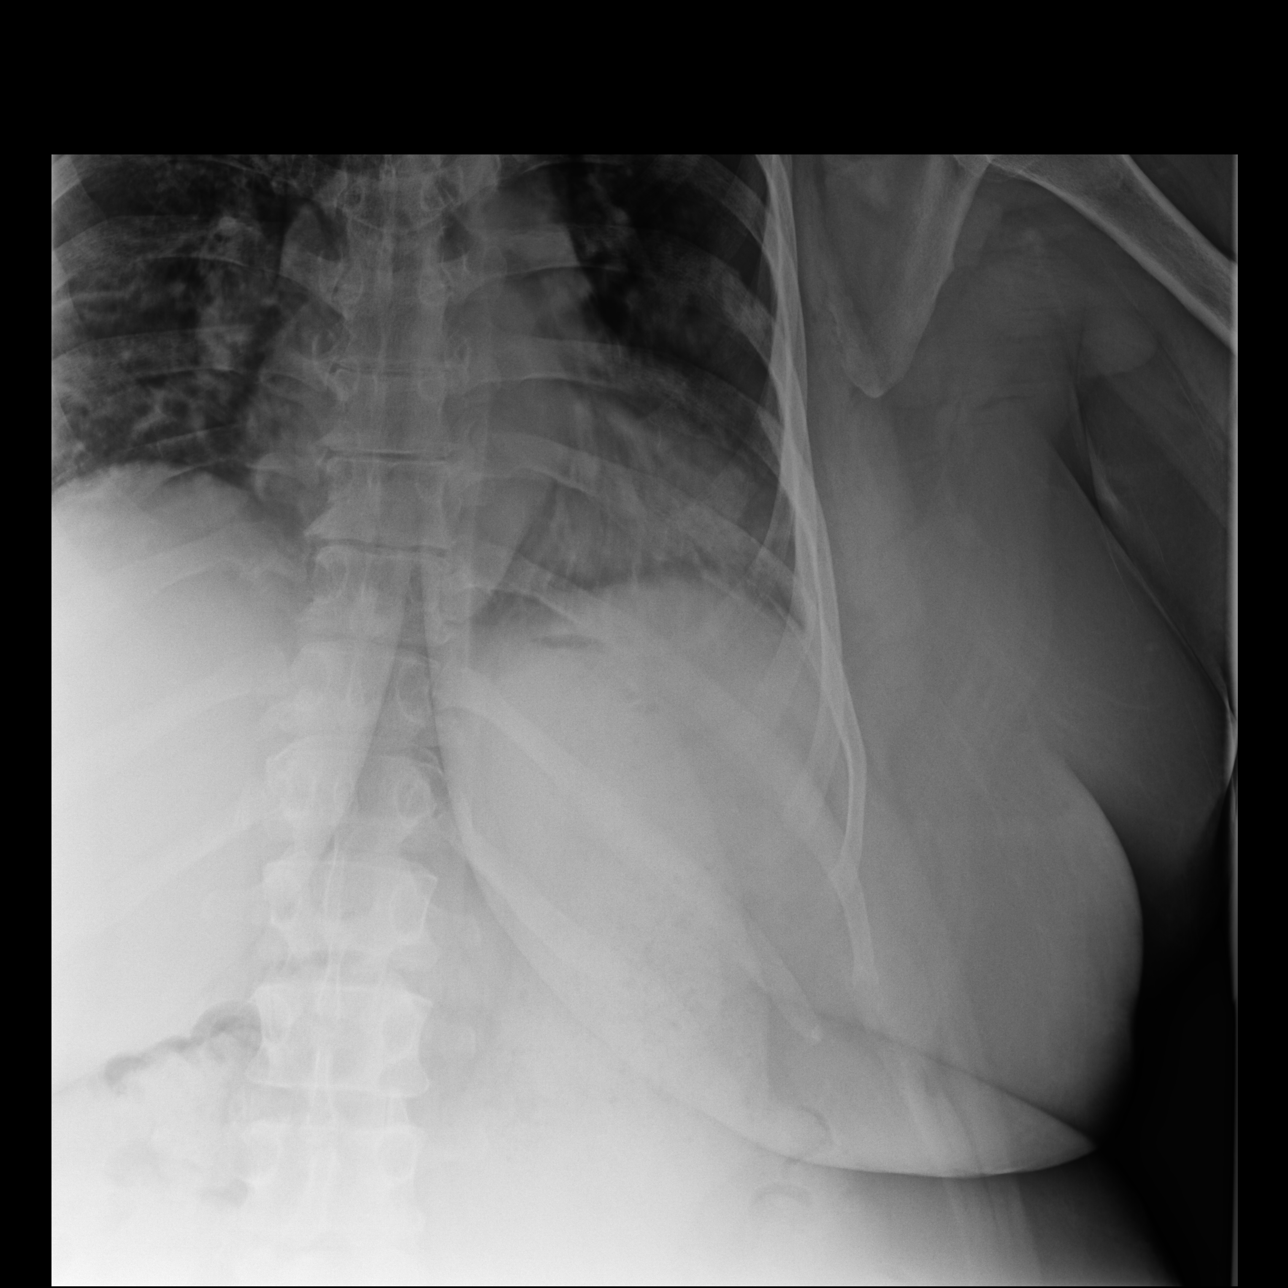

[dg ribs unilateral left (2 of 3)]
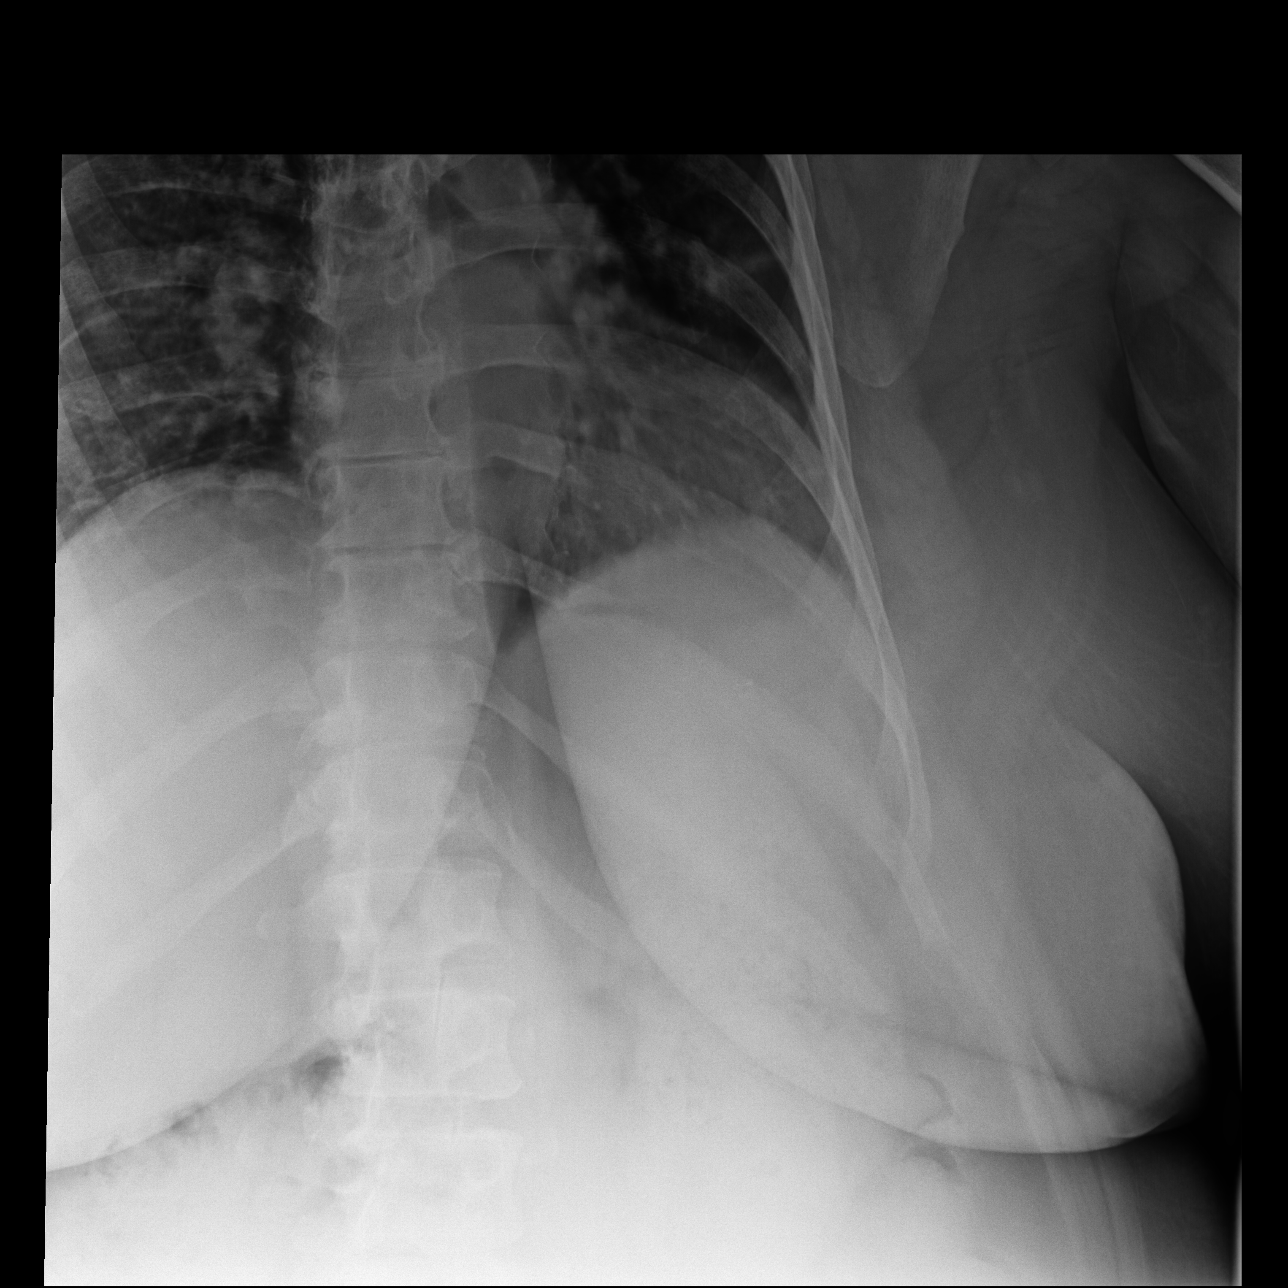

[dg ribs unilateral left (3 of 3)]
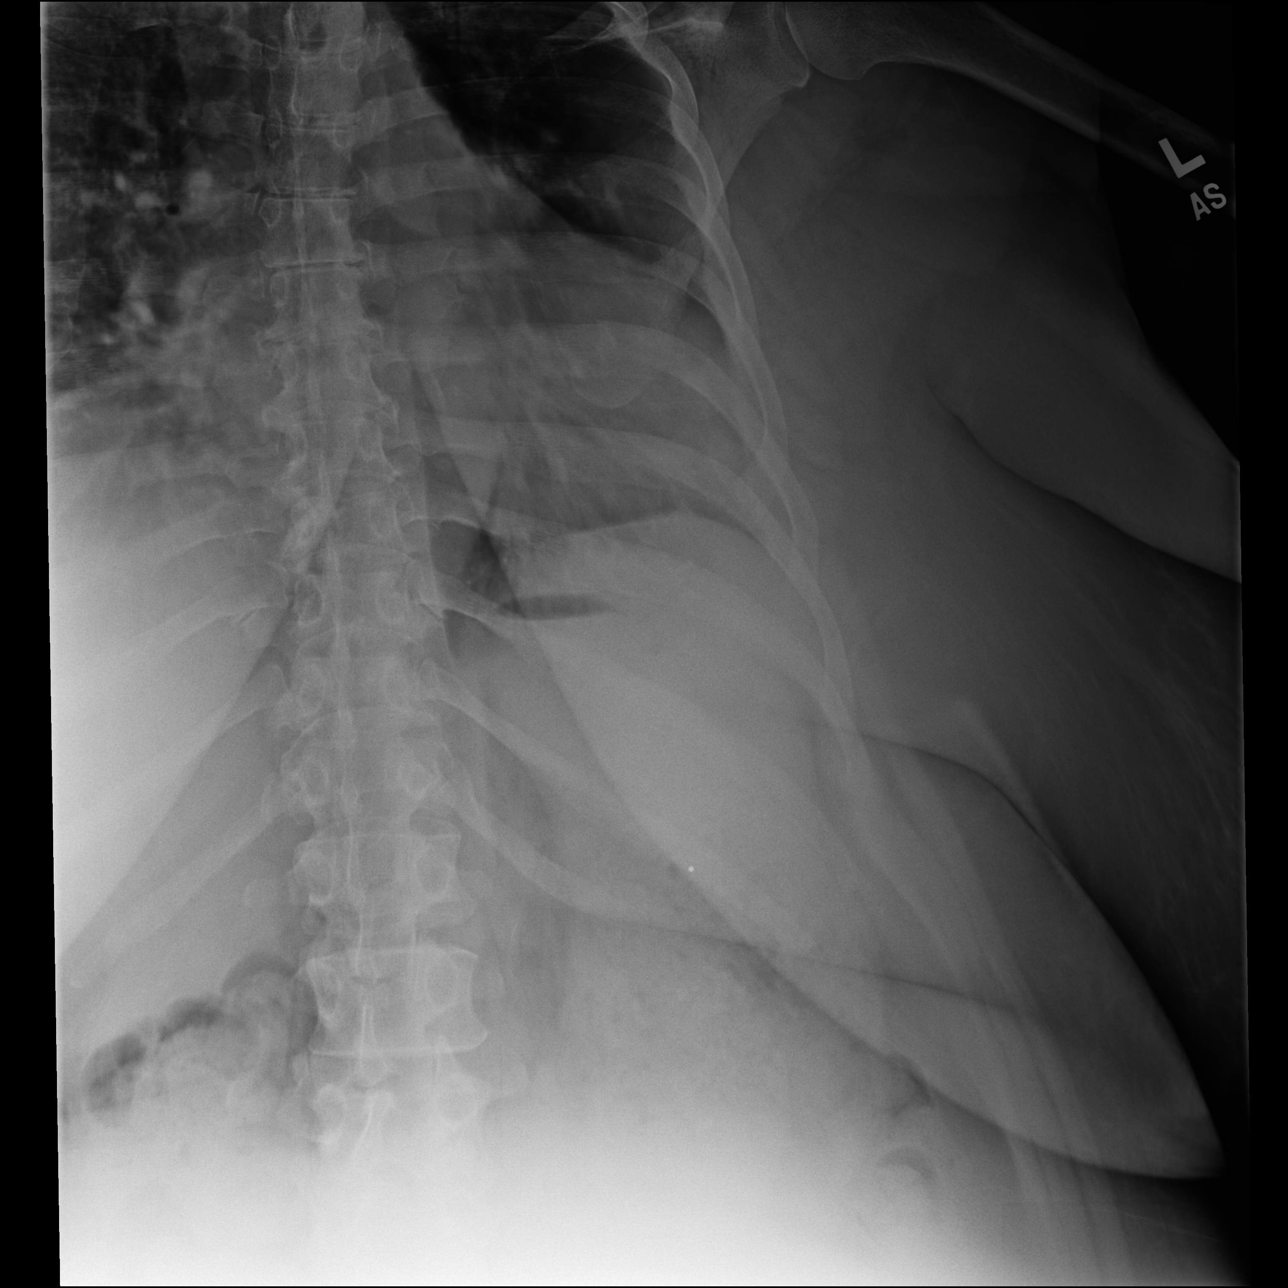

[3 of 3 positions shown; findings below may reference images not displayed]

FINDINGS: Left posterior seventh rib minimally displaced acute fracture. No
additional fracture identified.
IMPRESSION: Left posterior seventh rib minimally displaced acute fracture. No
additional fracture identified.

By: MIKKI M.D.

## 2018-08-18 IMAGING — CR DG CHEST 2V
2 series · 2 of 2 positions shown · non-contrast
Comparison: [DATE].

CLINICAL DATA: Left-sided rib pain.  Productive cough.

EXAM:
CHEST - 2 VIEW

[w chest pa]
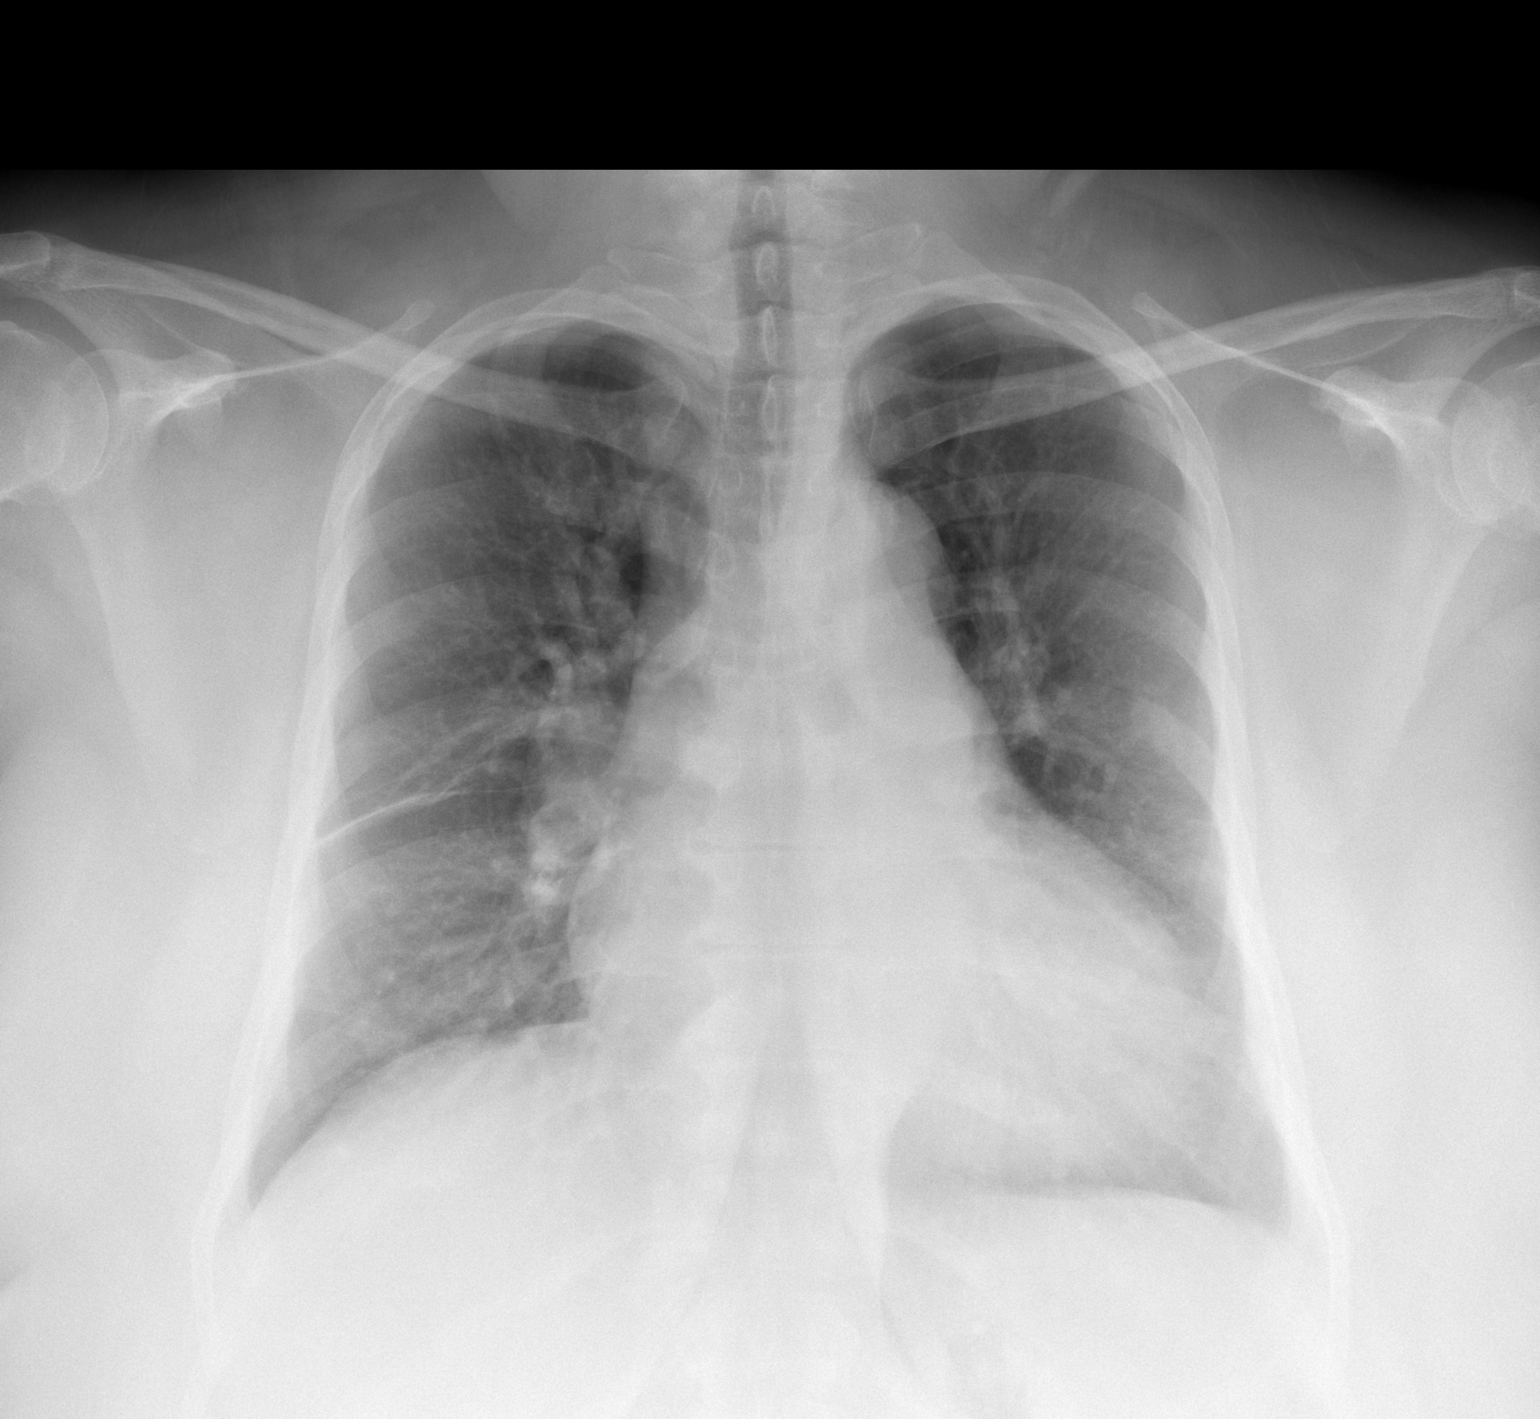

[w chest lat]
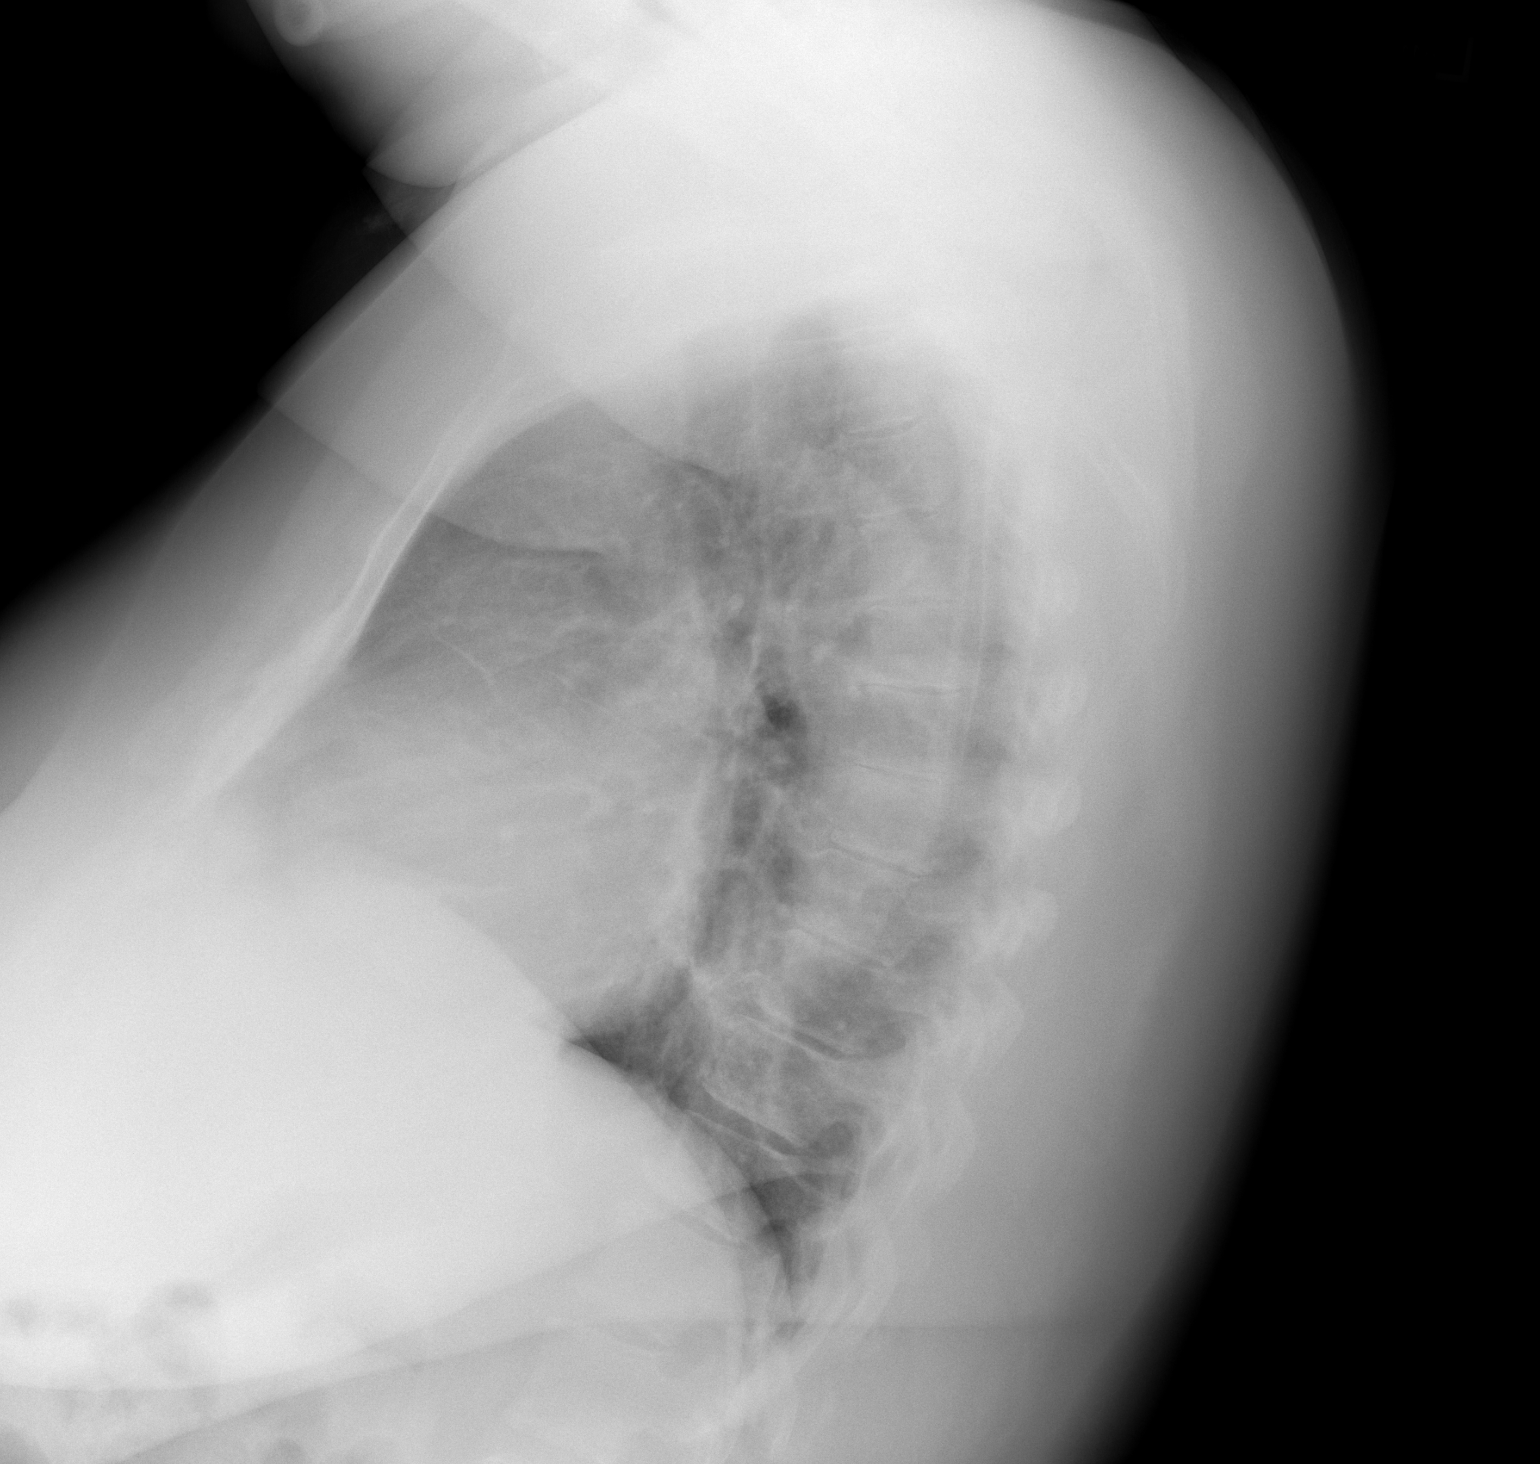

[2 of 2 positions shown; findings below may reference images not displayed]

FINDINGS: Cardiomegaly with pulmonary vascular prominence. No focal
infiltrate. Mild atelectatic changes both lung bases. No pleural
effusion or pneumothorax.
IMPRESSION: 1.  Mild atelectatic changes both lung bases.

2.  Cardiomegaly with mild pulmonary venous congestion.

## 2018-08-18 MED ORDER — KETOROLAC TROMETHAMINE 60 MG/2ML IM SOLN
60.0000 mg | Freq: Once | INTRAMUSCULAR | Status: AC
  Administered 2018-08-18: 60 mg via INTRAMUSCULAR

## 2018-08-18 MED ORDER — METHYLPREDNISOLONE 4 MG PO TBPK: ORAL_TABLET | ORAL | 0 refills | Status: DC

## 2018-08-18 MED ORDER — TRAMADOL HCL 50 MG PO TABS: 50.0000 mg | ORAL_TABLET | Freq: Four times a day (QID) | ORAL | 0 refills | Status: DC | PRN

## 2018-08-18 NOTE — Telephone Encounter (Signed)
Patient called in and stated that she was having chest pain and tightness, coughed a good amount and decided to go to her primary care. They ordered a chest x-ray and found that patient has cracked her 7th rib. Patient stated that she was told she needed to contact her pulmonologist to have further testing for her breathing. Patient would like a call back.

## 2018-08-18 NOTE — Telephone Encounter (Signed)
Spoke with patient. She stated that she had been coughing for the past few days and started to experience chest pain and tightness on her left side. She felt a pop under her left breast. She made an appt with her PCP. They ordered labs and xrays. During the xrays, it was determined that she had cracked her 7th rib and an enlarged heart. She is being referred to cardiologist for this.   She was advised to inform MW of what is going on since her PCP is concerned about her developing PNA.   MW, please advise. Thanks!

## 2018-08-18 NOTE — Patient Instructions (Signed)
Use ice on area of pain for 15 minutes 2-4 times a day today and tomorrow, then alternate to ice and heat for 4-7 days Come back for follow up in 7-10 days or sooner if you get worse and not fully better.    Chest Wall Pain   Chest wall pain is pain in or around the bones and muscles of your chest. Sometimes, an injury causes this pain. Sometimes, the cause may not be known. This pain may take several weeks or longer to get better. Follow these instructions at home: Pay attention to any changes in your symptoms. Take these actions to help with your pain:  Rest as told by your doctor.  Avoid activities that cause pain. Try not to use your chest, belly (abdominal), or side muscles to lift heavy things.  If directed, apply ice to the painful area: ? Put ice in a plastic bag. ? Place a towel between your skin and the bag. ? Leave the ice on for 20 minutes, 2-3 times per day.  Take over-the-counter and prescription medicines only as told by your doctor.  Do not use tobacco products, including cigarettes, chewing tobacco, and e-cigarettes. If you need help quitting, ask your doctor.  Keep all follow-up visits as told by your doctor. This is important.  Contact a doctor if:  You have a fever.  Your chest pain gets worse.  You have new symptoms. Get help right away if:  You feel sick to your stomach (nauseous) or you throw up (vomit).  You feel sweaty or light-headed.  You have a cough with phlegm (sputum) or you cough up blood.  You are short of breath. This information is not intended to replace advice given to you by your health care provider. Make sure you discuss any questions you have with your health care provider. Document Released: 04/01/2008 Document Revised: 03/21/2016 Document Reviewed: 01/09/2015 Elsevier Interactive Patient Education  [employer].

## 2018-08-18 NOTE — Telephone Encounter (Signed)
Left message for patient to call back  

## 2018-08-18 NOTE — Progress Notes (Addendum)
Subjective:     Patient ID: Jessica Snow , female    DOB: 04-06-1966 , 52 y.o.   MRN: [id number]   HPI  Onset of L rib pain which started under L breast radiating to L back. Yesterday after coughing felt sharp stabbing pain, and movement made it worse. She tried heating pad last night and Ibuprofen. Heat did not help.  Her asthma has been acting up since end of September and has seen her pulmonologist.   Past Medical History:  Diagnosis Date  . Asthma   . HTN (hypertension)   . Pneumonia       Current Outpatient Medications:  .  albuterol (PROVENTIL) (2.5 MG/3ML) 0.083% nebulizer solution, 1 vial in neb every 4 hours as needed, Disp: , Rfl:  .  BELVIQ XR 20 MG TB24, Take 1 tablet by mouth daily., Disp: , Rfl: 1 .  benzonatate (TESSALON) 200 MG capsule, Take 1 capsule (200 mg total) by mouth 3 (three) times daily as needed for cough., Disp: 30 capsule, Rfl: 3 .  budesonide-formoterol (SYMBICORT) 80-4.5 MCG/ACT inhaler, Inhale 2 puffs into the lungs 2 (two) times daily., Disp: 1 Inhaler, Rfl: 11 .  famotidine (PEPCID) 20 MG tablet, One at bedtime, Disp: 30 tablet, Rfl: 11 .  montelukast (SINGULAIR) 10 MG tablet, Take 1 tablet (10 mg total) by mouth at bedtime., Disp: 30 tablet, Rfl: 11 .  olmesartan-hydrochlorothiazide (BENICAR HCT) 40-25 MG tablet, Take 1 tablet by mouth daily., Disp: 30 tablet, Rfl: 11 .  pantoprazole (PROTONIX) 40 MG tablet, TAKE 30- 60 MIN BEFORE YOUR FIRST AND LAST MEALS OF THE DAY, Disp: 180 tablet, Rfl: 0   No Known Allergies   Review of Systems  Constitutional: Negative for chills, diaphoresis and fever.  HENT: Positive for sneezing. Negative for congestion, ear pain, facial swelling, postnasal drip, rhinorrhea, sore throat and trouble swallowing.   Eyes: Negative for discharge and itching.  Respiratory: Positive for cough and wheezing. Negative for chest tightness.        More cough since the end of Sept  Cardiovascular: Negative for chest pain and leg  swelling.  Gastrointestinal: Negative for abdominal pain, constipation, nausea and vomiting.  Musculoskeletal: Positive for back pain. Negative for gait problem.       Has L rib cage pain  Skin: Negative for rash.  Neurological: Negative for numbness.    Today's Vitals   08/18/18 0933  BP: 130/80  Pulse: 81  Temp: 97.9 F (36.6 C)  TempSrc: Oral  SpO2: 96%  Weight: 259 lb 3.2 oz (117.6 kg)  Height: 5' 2.5" (1.588 m)  PainSc: 10-Worst pain ever   Body mass index is 46.65 kg/m.   Objective:  Physical Exam  Constitutional: She is oriented to person, place, and time. She appears well-developed and well-nourished.  Non-toxic appearance. She appears distressed.  Is in pain holding from breathing deep and moving fast  HENT:  Head: Normocephalic.  Mouth/Throat: Oropharynx is clear and moist. No oropharyngeal exudate.  Eyes: EOM are normal. No scleral icterus.  Cardiovascular: Normal rate, regular rhythm and normal heart sounds.  No murmur heard. Pulmonary/Chest: Breath sounds normal. No respiratory distress. She has no wheezes. She has no rhonchi. She has no rales. She exhibits tenderness.  Has local tenderness below L breast and laterally around 7th-8th rib region. No crepitations palpated, no rashes present  Abdominal: Soft. Bowel sounds are normal. She exhibits no pulsatile liver, no pulsatile midline mass and no mass. There is no hepatosplenomegaly. There is tenderness  in the left upper quadrant. There is no rigidity, no rebound and no guarding.  Abdomen is obese, is tender on LUQ, no guarding or rebound.   Neurological: She is alert and oriented to person, place, and time.  Skin: Skin is warm and dry. No rash noted.  Psychiatric: She has a normal mood and affect. Her behavior is normal.  Nursing note and vitals reviewed.   CXR- shows Mild atelectatic changes both lung bases, pulmonary congestion, no infiltrates.  Rib xray shows 7th rib fracture, mildly displaced.   Assessment  And Plan:     1. Rib pain on left side secondary to L posterior 7th rib fracture    which is minimally displaced. She was given Toradol 60 mg IM here. I placed her on Tramadol for pain and educated her to cough using a pillow on her rib area, and take some deep breaths several times a day with the pillow to prevent pneumonia. Warned if her pain gets worse, gets sudden SOB or feels a pop in the rib region, that needs go to ER.   2- Asthma exacerbation- acute- needs to FU with pulmonoloigst  3- Cardiomegaly- new. Echo ordered  4- Mild Pulmonary venous congestion- new, will FU with pulmonologist. Pt has apt with him 11/19 but will see if she can get it moved to sooner.      Tish Begin RODRIGUEZ-SOUTHWORTH, PA-C

## 2018-08-18 NOTE — Telephone Encounter (Signed)
Sorry to hear that - needs f/u here w/in a week to 10 days to do f/u and sooner by NP's if breathing /coughing worse on PCPs rx

## 2018-08-19 NOTE — Telephone Encounter (Signed)
Called and spoke with pt to see if I could reschedule her current appt so she can see MW sooner.  Pt stated that that would be fine. Rescheduled appt for pt to see MW 11/4 at 1045.  Nothing further needed.

## 2018-08-22 ENCOUNTER — Other Ambulatory Visit: Payer: Self-pay | Admitting: Internal Medicine

## 2018-08-27 ENCOUNTER — Ambulatory Visit (HOSPITAL_COMMUNITY)
Admission: RE | Admit: 2018-08-27 | Discharge: 2018-08-27 | Disposition: A | Discharge status: Home / Self Care | Payer: BC Managed Care – PPO | Source: Ambulatory Visit | Attending: Internal Medicine | Admitting: Internal Medicine

## 2018-08-27 DIAGNOSIS — I351 Nonrheumatic aortic (valve) insufficiency: Secondary | ICD-10-CM | POA: Diagnosis not present

## 2018-08-27 DIAGNOSIS — I517 Cardiomegaly: Secondary | ICD-10-CM

## 2018-08-27 DIAGNOSIS — I509 Heart failure, unspecified: Secondary | ICD-10-CM | POA: Insufficient documentation

## 2018-08-31 ENCOUNTER — Other Ambulatory Visit (INDEPENDENT_AMBULATORY_CARE_PROVIDER_SITE_OTHER): Payer: BC Managed Care – PPO

## 2018-08-31 ENCOUNTER — Ambulatory Visit (INDEPENDENT_AMBULATORY_CARE_PROVIDER_SITE_OTHER)
Admission: RE | Admit: 2018-08-31 | Discharge: 2018-08-31 | Disposition: A | Discharge status: Home / Self Care | Payer: BC Managed Care – PPO | Source: Ambulatory Visit | Attending: Acute Care | Admitting: Acute Care

## 2018-08-31 ENCOUNTER — Ambulatory Visit (INDEPENDENT_AMBULATORY_CARE_PROVIDER_SITE_OTHER): Payer: BC Managed Care – PPO | Admitting: Acute Care

## 2018-08-31 ENCOUNTER — Encounter: Payer: Self-pay | Admitting: Acute Care

## 2018-08-31 VITALS — BP 142/86 | HR 77 | Ht 63.0 in | Wt 252.0 lb

## 2018-08-31 DIAGNOSIS — K219 Gastro-esophageal reflux disease without esophagitis: Secondary | ICD-10-CM

## 2018-08-31 DIAGNOSIS — J45991 Cough variant asthma: Secondary | ICD-10-CM | POA: Diagnosis not present

## 2018-08-31 DIAGNOSIS — I509 Heart failure, unspecified: Secondary | ICD-10-CM | POA: Diagnosis not present

## 2018-08-31 DIAGNOSIS — I5030 Unspecified diastolic (congestive) heart failure: Secondary | ICD-10-CM | POA: Insufficient documentation

## 2018-08-31 LAB — BASIC METABOLIC PANEL
BUN: 10 mg/dL (ref 6–23)
CHLORIDE: 100 meq/L (ref 96–112)
CO2: 32 mEq/L (ref 19–32)
Calcium: 9.6 mg/dL (ref 8.4–10.5)
Creatinine, Ser: 0.82 mg/dL (ref 0.40–1.20)
GFR: 94.69 mL/min (ref >60.00)
GLUCOSE: 97 mg/dL (ref 70–99)
POTASSIUM: 3.6 meq/L (ref 3.5–5.1)
Sodium: 139 mEq/L (ref 135–145)

## 2018-08-31 LAB — POCT EXHALED NITRIC OXIDE: FeNO level (ppb): 29

## 2018-08-31 IMAGING — DX DG CHEST 2V
2 series · 2 of 2 positions shown · non-contrast
Comparison: Radiographs [DATE].

CLINICAL DATA: Cough, shortness of breath.

EXAM:
CHEST - 2 VIEW

[chest pa]
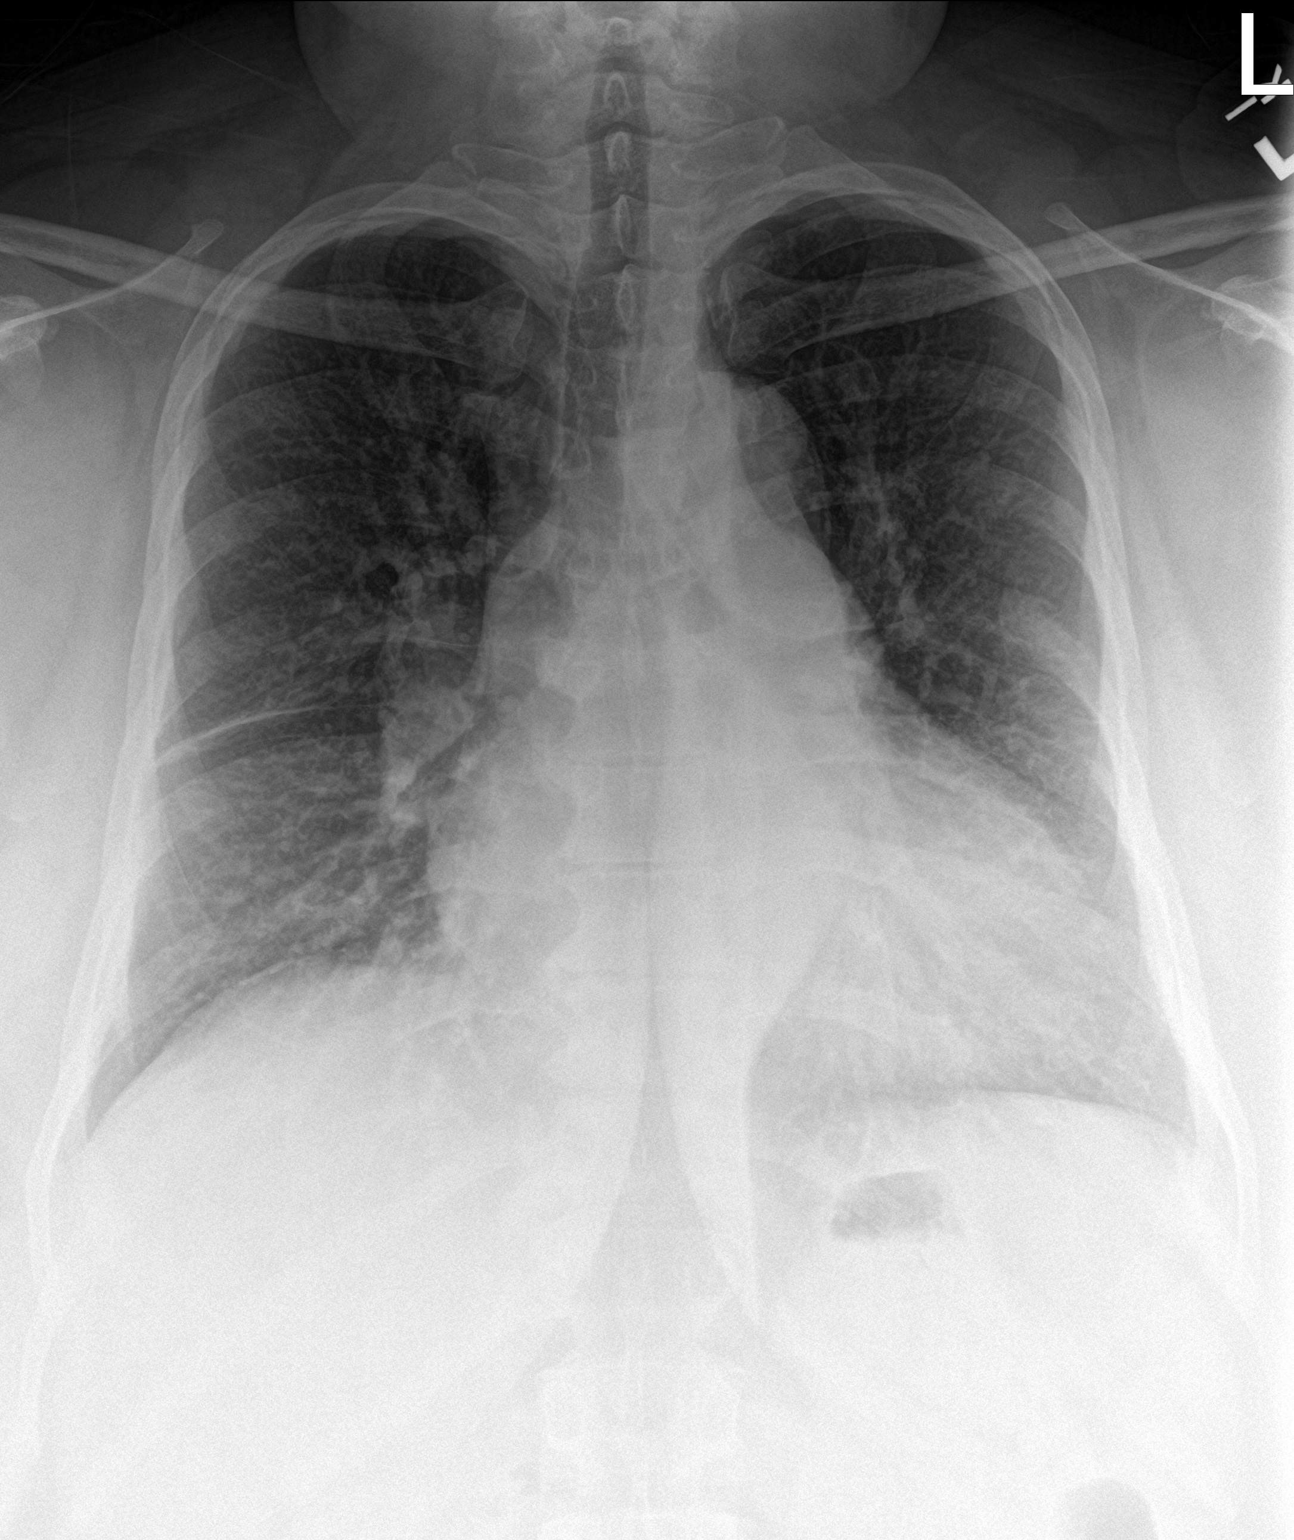

[chest lat]
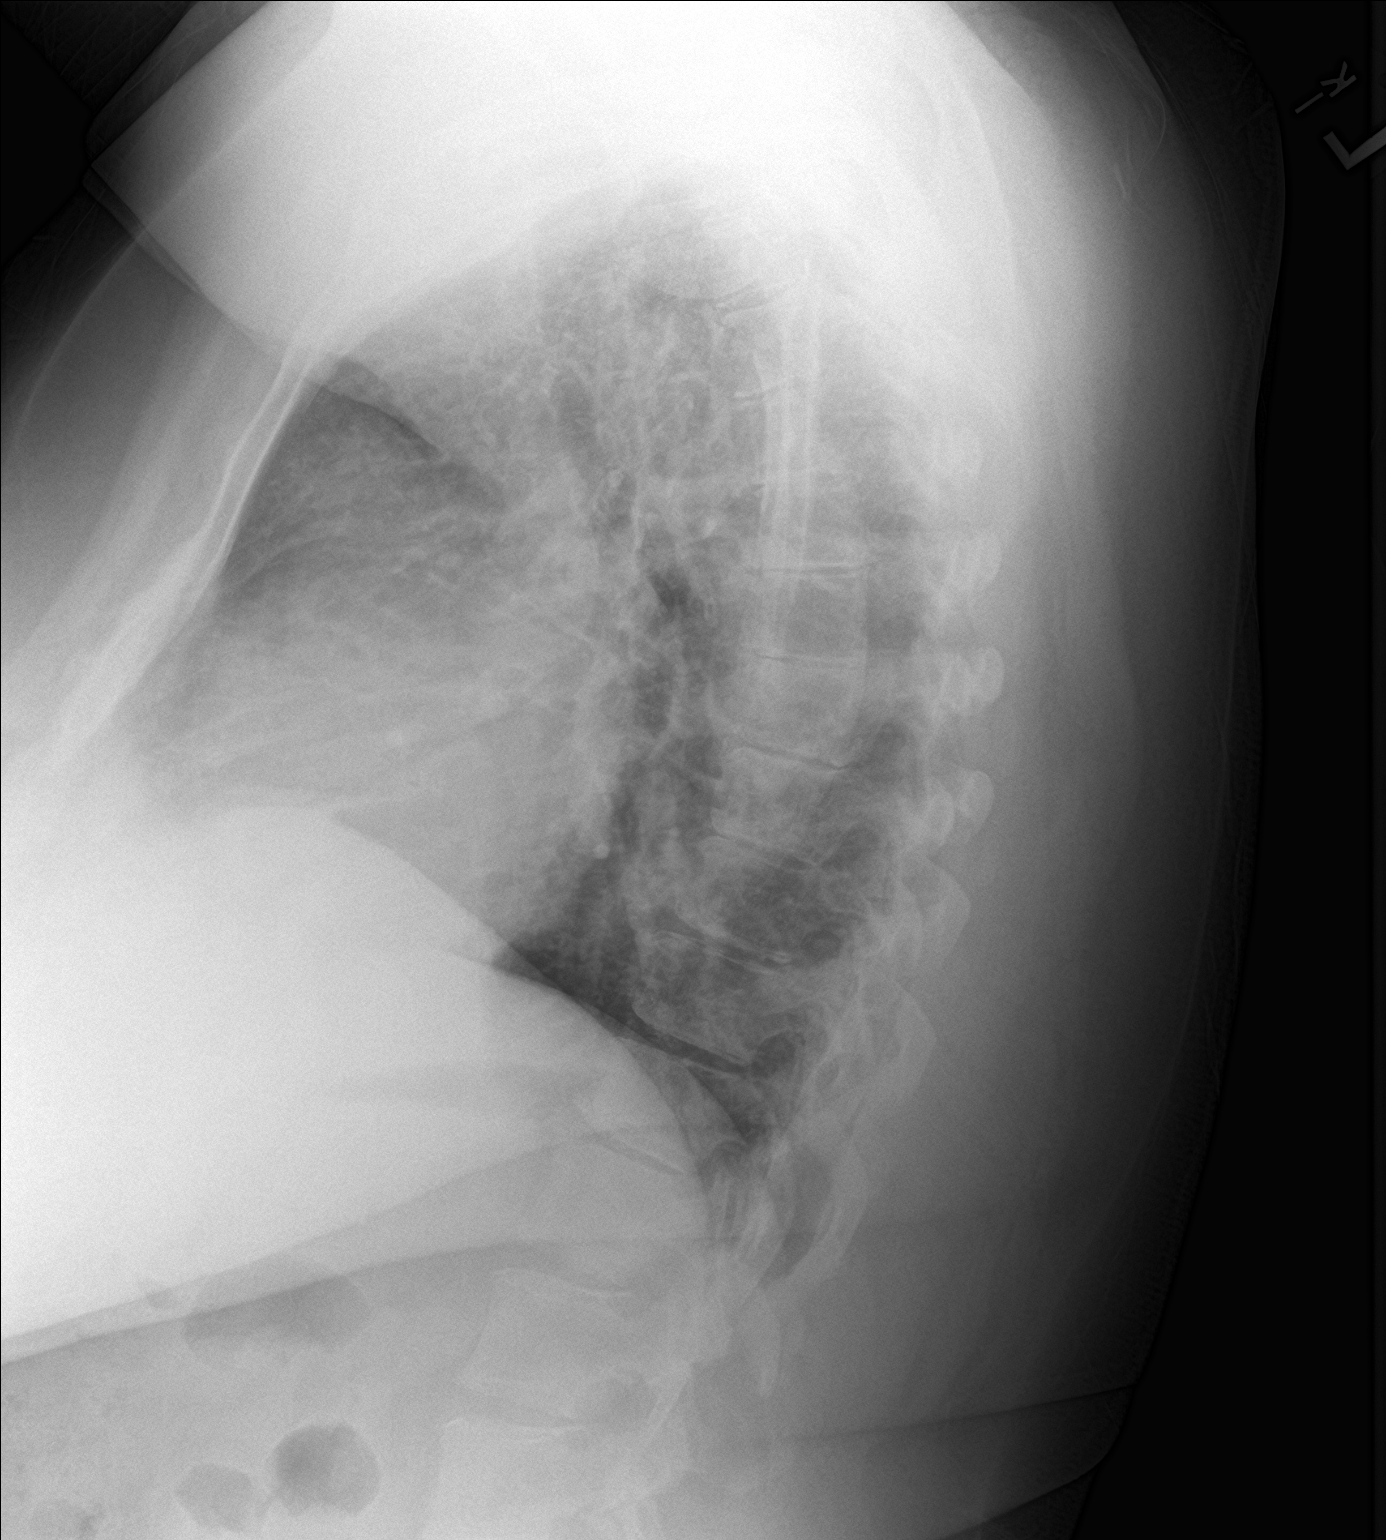

[2 of 2 positions shown; findings below may reference images not displayed]

FINDINGS: Stable cardiomegaly. No pneumothorax or pleural effusion is noted.
Both lungs are clear. The visualized skeletal structures are
unremarkable.
IMPRESSION: No active cardiopulmonary disease.

## 2018-08-31 MED ORDER — PREDNISONE 10 MG PO TABS: ORAL_TABLET | ORAL | 0 refills | Status: DC

## 2018-08-31 MED ORDER — TRAMADOL HCL 50 MG PO TABS: 50.0000 mg | ORAL_TABLET | Freq: Four times a day (QID) | ORAL | 0 refills | Status: DC | PRN

## 2018-08-31 NOTE — Assessment & Plan Note (Signed)
Continues to complain of reflux/ mitigating factor in cough Not following GERD diet Plan: Continue protonix (pantoprazole) 40 mg  Take 30- 60 min before your first and last meals of the day  We will give you a copy of the GERD diet

## 2018-08-31 NOTE — Patient Instructions (Addendum)
It is nice to meet you today. We will refer you to cardiology for evaluation of heart failure. CXR today We will call you with the results BMET today to check your renal function If your CXR shows fluid in the lungs we will call in a prescription for diuretic as we discussed  We will do a FENO today  Prednisone taper; 10 mg tablets: 4 tabs x 2 days, 3 tabs x 2 days, 2 tabs x 2 days 1 tab x 2 days then stop. Continue singulair 10 mg daily and Symbicort 80 Take 2 puffs first thing in am and then another 2 puffs about 12 hours later.  Continue protonix (pantoprazole) 40 mg  Take 30- 60 min before your first and last meals of the day  For breathing >>> Keep using the nebulizer up to every 4 hours if needed If CXR shows vascular congestion, we will send in an order for lasix. For coughing >>> Take delsym two tsp every 12 hours and supplement if needed with  tramadol 50 mg up to 2 every 4 hours  At bedtime only  to suppress the urge to cough. Swallowing water and/or using ice chips/non mint and menthol containing candies (such as lifesavers or sugarless jolly ranchers) are also effective.  You should rest your voice and avoid activities that you know make you cough. Follow up in 2 weeks with Maralyn Sago or Dr. Sherene Sires Please contact office for sooner follow up if symptoms do not improve or worsen or seek emergency care

## 2018-08-31 NOTE — Progress Notes (Signed)
History of Present Illness Jessica Snow is a 52 y.o. female former smoker, quit April 2019,  seen for pulmonary consult June 2019 for cough, questionable ACE inhibitor related cough plus or minus cough variant asthma . She is followed by Dr. Sherene Sires.  Brief patient profile:  52 yobf quit smoking 01/2018 healthy as child/ teenager ran track in HS very competitive no problem with IUP 52 yo with baseline wt 140 with progressive wt gain since her late 30's   complicated HBP on  lisinopril then recurrent bronchtis since age 39 which = 2012 referred to pulmonary clinic 04/02/2018 by Dr   Jaci Standard PA with cough and orthopnea.  08/31/2018 Acute OV: Pt. Presents for 2 month follow up. She was last seen by Dr. Sherene Sires 05/2018 . She had been transitioned off her ACEI to Losartan. She was stared on Singulair and Symbicort as maintenance. She was also started on Delsym and Tramadol ,pepcid and protonix.She was encouraged to take chlorpheniramine for PND. She states she has been compliant with her medications.She was doing well until about 2 weeks ago when the weather changed. She has some coughing recur. Then about 1 week ago, her cough got worse and she developed pain  In her left chest. She was seen by her PCP Dr. Allyne Gee and after a CXR was obtained, she was diagnosed with a L posterior 7th rib fracture. CXR also showed pulmonary vascular congestion at that time.She had an echo done 08/27/2018 . This indicated EF of 60-65%, and Grade 2 diastolic dysfunction. She was referred back to pulmonary for the vascular congestion. CXR 08/31/2018 did not indicate edema. She states she was told not to take deep breaths with the rib fracture. Cough is still an issue, she is not following a low sodium diet. She is not following a GERD diet.  Test Results: CXR 11/4>> Stable cardiomegaly. No pneumothorax or pleural effusion is noted. Both lungs are clear. The visualized skeletal structures are Unremarkable. No active  cardiopulmonary disease.  DLCO 11/4>> 29 ppb  CXR 10/22/2 Mild atelectatic changes both lung bases. Cardiomegaly with mild pulmonary venous congestion.  Echo 08/27/2018 - Left ventricle: The cavity size was normal. There was mild focal   basal hypertrophy of the septum. Systolic function was normal.   The estimated ejection fraction was in the range of 60% to 65%.   Wall motion was normal; there were no regional wall motion   abnormalities. There was an increased relative contribution of   atrial contraction to ventricular filling. Features are   consistent with a pseudonormal left ventricular filling pattern,   with concomitant abnormal relaxation and increased filling   pressure (grade 2 diastolic dysfunction). Doppler parameters are   consistent with high ventricular filling pressure. - Aortic valve: There was mild regurgitation. Valve area (VTI):   2.36 cm^2. Valve area (Vmax): 2.56 cm^2. Valve area (Vmean): 2.44   cm^2. - Aorta: Ascending aortic diameter: 40 mm (S). - Ascending aorta: The ascending aorta was mildly dilated.  PFT's 05/15/2018 FEV1 1.39 (62 % ) ratio 82 p albuterol per neb w/in 4 h prior to study with DLCO 85 % corrects to 142 % for alv volume And ERV 29% and very minimal curvature and active "wheeze" on exam during study  - Allergy profile 05/15/18 >Eos 0.9/ IgE 362 RAST neg  - FENO 06/01/2018 =74  - Spirometry 06/01/2018 FEV1 0.65 (29%) Ratio 73 with mild curvature   CBC Latest Ref Rng & Units 08/18/2018 05/15/2018 10/08/2013  WBC  3.4 - 10.8 x10E3/uL 7.9 7.3 6.0  Hemoglobin 11.1 - 15.9 g/dL 16.1 09.6 04.5  Hematocrit 34.0 - 46.6 % 37.8 38.8 39.7  Platelets 150 - 450 x10E3/uL 277 242.0 218    BMP Latest Ref Rng & Units 08/31/2018 08/18/2018 04/10/2018  Glucose 70 - 99 mg/dL 97 95 409(W)  BUN 6 - 23 mg/dL 10 11 17   Creatinine 0.40 - 1.20 mg/dL 1.19 1.47 8.29  BUN/Creat Ratio 9 - 23 - 15 -  Sodium 135 - 145 mEq/L 139 143 141  Potassium 3.5 -  5.1 mEq/L 3.6 4.5 3.7  Chloride 96 - 112 mEq/L 100 104 103  CO2 19 - 32 mEq/L 32 25 30  Calcium 8.4 - 10.5 mg/dL 9.6 9.2 9.6    BNP    Component Value Date/Time   BNP 26.1 12/20/2011 1733    ProBNP    Component Value Date/Time   PROBNP 308.7 (H) 10/08/2013 0954    PFT    Component Value Date/Time   FEV1PRE 1.39 05/15/2018 0951   FVCPRE 1.69 05/15/2018 0951   TLC 3.74 05/15/2018 0951   DLCOUNC 19.52 05/15/2018 0951   PREFEV1FVCRT 82 05/15/2018 0951    Dg Chest 2 View  Result Date: 08/31/2018 CLINICAL DATA:  Cough, shortness of breath. EXAM: CHEST - 2 VIEW COMPARISON:  Radiographs of May 15, 2018. FINDINGS: Stable cardiomegaly. No pneumothorax or pleural effusion is noted. Both lungs are clear. The visualized skeletal structures are unremarkable. IMPRESSION: No active cardiopulmonary disease. Electronically Signed   By: Lupita Raider, M.D.   On: 08/31/2018 12:02   Dg Chest 2 View  Result Date: 08/18/2018 CLINICAL DATA:  Left-sided rib pain.  Productive cough. EXAM: CHEST - 2 VIEW COMPARISON:  05/15/2018. FINDINGS: Cardiomegaly with pulmonary vascular prominence. No focal infiltrate. Mild atelectatic changes both lung bases. No pleural effusion or pneumothorax. IMPRESSION: 1.  Mild atelectatic changes both lung bases. 2.  Cardiomegaly with mild pulmonary venous congestion. Electronically Signed   By: Maisie Fus  Register   On: 08/18/2018 11:09   Dg Ribs Unilateral Left  Result Date: 08/18/2018 CLINICAL DATA:  52 y/o F; left lower anterior, lateral, and posterior rib pain for 3-4 days. Recent cough. Pain while coughing. EXAM: LEFT RIBS - 2 VIEW COMPARISON:  08/18/2018 chest radiograph. FINDINGS: Left posterior seventh rib minimally displaced acute fracture. No additional fracture identified. IMPRESSION: Left posterior seventh rib minimally displaced acute fracture. No additional fracture identified. Electronically Signed   By: Mitzi Hansen M.D.   On: 08/18/2018 13:46      Past medical hx Past Medical History:  Diagnosis Date  . Asthma   . HTN (hypertension)   . Pneumonia      Social History   Tobacco Use  . Smoking status: Former Smoker    Packs/day: 0.25    Years: 10.00    Pack years: 2.50    Types: Cigarettes    Last attempt to quit: 01/26/2018    Years since quitting: 0.5  . Smokeless tobacco: Former Engineer, water Use Topics  . Alcohol use: No  . Drug use: No    Ms.Pedraza reports that she quit smoking about 7 months ago. Her smoking use included cigarettes. She has a 2.50 pack-year smoking history. She has quit using smokeless tobacco. She reports that she does not drink alcohol or use drugs.  Tobacco Cessation: Former smoker , Quit 01/2018 with a   Past surgical hx, Family hx, Social hx all reviewed.  Current Outpatient Medications on File Prior  to Visit  Medication Sig  . albuterol (PROVENTIL) (2.5 MG/3ML) 0.083% nebulizer solution 1 vial in neb every 4 hours as needed  . BELVIQ XR 20 MG TB24 Take 1 tablet by mouth daily.  . benzonatate (TESSALON) 200 MG capsule Take 1 capsule (200 mg total) by mouth 3 (three) times daily as needed for cough.  . budesonide-formoterol (SYMBICORT) 80-4.5 MCG/ACT inhaler Inhale 2 puffs into the lungs 2 (two) times daily.  . famotidine (PEPCID) 20 MG tablet One at bedtime  . methylPREDNISolone (MEDROL DOSEPAK) 4 MG TBPK tablet Take as directed per pack  . montelukast (SINGULAIR) 10 MG tablet Take 1 tablet (10 mg total) by mouth at bedtime.  Marland Kitchen olmesartan-hydrochlorothiazide (BENICAR HCT) 40-25 MG tablet Take 1 tablet by mouth daily.  . pantoprazole (PROTONIX) 40 MG tablet TAKE 30- 60 MIN BEFORE YOUR FIRST AND LAST MEALS OF THE DAY   No current facility-administered medications on file prior to visit.      No Known Allergies  Review Of Systems:  Constitutional:   No  weight loss, night sweats,  Fevers, chills, fatigue, or  lassitude.  HEENT:   No headaches,  Difficulty swallowing,   Tooth/dental problems, or  Sore throat,                No sneezing, itching, ear ache, nasal congestion, + post nasal drip,   CV:  L rib/ chest pain,  No Orthopnea, PND, + swelling in lower extremities, No anasarca, dizziness, palpitations, syncope.   GI  + heartburn, indigestion, No abdominal pain, nausea, vomiting, diarrhea, change in bowel habits, loss of appetite, bloody stools.   Resp: + shortness of breath with exertion less at rest.  No excess mucus, no productive cough,  + non-productive cough,  No coughing up of blood.  No change in color of mucus.  + wheezing.  No chest wall deformity  Skin: no rash or lesions.  GU: no dysuria, change in color of urine, no urgency or frequency.  No flank pain, no hematuria   MS:  No joint pain or swelling.  No decreased range of motion.  No back pain.  Psych:  No change in mood or affect. No depression or anxiety.  No memory loss.   Vital Signs BP (!) 142/86   Pulse 77   Ht 5\' 3"  (1.6 m)   Wt 252 lb (114.3 kg)   LMP 12/19/2005   SpO2 96%   BMI 44.64 kg/m    Physical Exam:  General- No distress,  A&Ox3, pleasant ENT: No sinus tenderness, TM clear, pale nasal mucosa, no oral exudate,+ post nasal drip, no LAN Cardiac: S1, S2, regular rate and rhythm, no murmur Chest: No wheeze/ rales/ dullness; no accessory muscle use, no nasal flaring, no sternal retractions, diminished per bases Abd.: Soft Non-tender, ND, BS +, Body mass index is 44.64 kg/m. Ext: No clubbing cyanosis, + BLE edema R>L, no obvious deformities Neuro:  normal strength, MAE x 4, Alert and oriented x 3 Skin: No rashes, warm and dry Psych: normal mood and behavior   Assessment/Plan  Diastolic heart failure (HCC) New diagnosis of diastolic HF 10/22 CXR + for vascular congestion 11/4 CXR cleared Echo endorses grade 2 diastolic HF Plan: We will refer you to cardiology for evaluation of heart failure. CXR today We will call you with the results BMET today to check  your renal function If your CXR shows fluid in the lungs we will call in a prescription for diuretic as we discussed until  you can see a cardiologist Follow up in 2 weeks Please contact office for sooner follow up if symptoms do not improve or worsen or seek emergency care     Cough variant asthma vs uacs  Had resolved with previous measures Recurrent with weather change States she is compliant with Singulair and Symbicort  Coughing so hard 2 weeks ago she broke a rib Respiratory allergy profile negative IgE > 362 Plan: We will do a FENO today  Prednisone taper; 10 mg tablets: 4 tabs x 2 days, 3 tabs x 2 days, 2 tabs x 2 days 1 tab x 2 days then stop. Continue singulair 10 mg daily and Symbicort 80 Take 2 puffs first thing in am and then another 2 puffs about 12 hours later.  Keep using the nebulizer up to every 4 hours if needed. For coughing >>> Take delsym two tsp every 12 hours and supplement if needed with  tramadol 50 mg up to 2 every 4 hours  At bedtime only  to suppress the urge to cough.  Swallowing water and/or using ice chips/non mint and menthol containing candies (such as lifesavers or sugarless jolly ranchers) are also effective.  You should rest your voice and avoid activities that you know make you cough. Follow up in 2 weeks with Maralyn Sago or Dr. Sherene Sires Please contact office for sooner follow up if symptoms do not improve or worsen or seek emergency care   Will consider addition of biologic injections once we have discussed with Dr. Sherene Sires.    Gastroesophageal reflux disease Continues to complain of reflux/ mitigating factor in cough Not following GERD diet Plan: Continue protonix (pantoprazole) 40 mg  Take 30- 60 min before your first and last meals of the day  We will give you a copy of the GERD diet    Bevelyn Ngo, NP 08/31/2018  5:16 PM

## 2018-08-31 NOTE — Assessment & Plan Note (Addendum)
Had resolved with previous measures Recurrent with weather change States she is compliant with Singulair and Symbicort  Coughing so hard 2 weeks ago she broke a rib Respiratory allergy profile negative IgE > 362 Plan: We will do a FENO today  Prednisone taper; 10 mg tablets: 4 tabs x 2 days, 3 tabs x 2 days, 2 tabs x 2 days 1 tab x 2 days then stop. Continue singulair 10 mg daily and Symbicort 80 Take 2 puffs first thing in am and then another 2 puffs about 12 hours later.  Keep using the nebulizer up to every 4 hours if needed. For coughing >>> Take delsym two tsp every 12 hours and supplement if needed with  tramadol 50 mg up to 2 every 4 hours  At bedtime only  to suppress the urge to cough.  Swallowing water and/or using ice chips/non mint and menthol containing candies (such as lifesavers or sugarless jolly ranchers) are also effective.  You should rest your voice and avoid activities that you know make you cough. Follow up in 2 weeks with Maralyn Sago or Dr. Sherene Sires Please contact office for sooner follow up if symptoms do not improve or worsen or seek emergency care   Will consider addition of biologic injections once we have discussed with Dr. Sherene Sires.

## 2018-08-31 NOTE — Assessment & Plan Note (Signed)
New diagnosis of diastolic HF 10/22 CXR + for vascular congestion 11/4 CXR cleared Echo endorses grade 2 diastolic HF Plan: We will refer you to cardiology for evaluation of heart failure. CXR today We will call you with the results BMET today to check your renal function If your CXR shows fluid in the lungs we will call in a prescription for diuretic as we discussed until you can see a cardiologist Follow up in 2 weeks Please contact office for sooner follow up if symptoms do not improve or worsen or seek emergency care

## 2018-09-09 ENCOUNTER — Encounter: Payer: Self-pay | Admitting: Cardiology

## 2018-09-14 ENCOUNTER — Encounter: Payer: Self-pay | Admitting: Internal Medicine

## 2018-09-14 ENCOUNTER — Ambulatory Visit: Payer: BC Managed Care – PPO | Admitting: Internal Medicine

## 2018-09-14 VITALS — BP 144/70 | HR 59 | Ht 63.0 in | Wt 261.0 lb

## 2018-09-14 DIAGNOSIS — J45991 Cough variant asthma: Secondary | ICD-10-CM

## 2018-09-14 MED ORDER — TRAMADOL HCL 50 MG PO TABS: 50.0000 mg | ORAL_TABLET | ORAL | 0 refills | Status: DC | PRN

## 2018-09-14 MED ORDER — GABAPENTIN 100 MG PO CAPS: 100.0000 mg | ORAL_CAPSULE | Freq: Three times a day (TID) | ORAL | 2 refills | Status: DC

## 2018-09-14 NOTE — Assessment & Plan Note (Addendum)
04/02/2018  After extensive coaching inhaler device  effectiveness =    75% from a baseline of 0 > continue symb 80 2bid - 04/02/2018 try off acei.  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 A single cause has been found from 38 to 82% of the time, multiple causes from 18 to 62%. Multiply caused cough has been the result of three diseases up to 42% of the time.   >>>>  rec  Add gabapentin 100 tid  Increase  1s Generation H1 rx per guidelines. Unfortunately the ones that are most sedating have the best benefit in pnds and should be tried before moving to zyrtec or the least sedating, clariton and allegra, since these have the least effect on pnds which is frequently cholinergically mediated.  Continue singulair and low dose symbicort but work on technique (higher doses of ics liable to aggravate component that is uacs) Return q 2 weeks for now with all meds in hand using a trust but verify approach to confirm accurate Medication  Reconciliation The principal here is that until we are certain that the  patients are doing what we've asked, it makes no sense to ask them to do more.

## 2018-09-14 NOTE — Patient Instructions (Addendum)
Weight control is simply a matter of calorie balance which needs to be tilted in your favor by eating less and exercising more.  To get the most out of exercise, you need to be continuously aware that you are short of breath, but never out of breath, for 30 minutes daily. As you improve, it will actually be easier for you to do the same amount of exercise  in  30 minutes so always push to the level where you are short of breath.  If this does not result in gradual weight reduction then I strongly recommend you see a nutritionist with a food diary x 2 weeks so that we can work out a negative calorie balance which is universally effective in steady weight loss programs.  Think of your calorie balance like you do your bank account where in this case you want the balance to go down so you must take in less calories than you burn up.  It's just that simple:  Hard to do, but easy to understand.  Good luck!   For drainage / throat tickle try take CHLORPHENIRAMINE  4 mg - take one every 4 hours as needed - available over the counter- may cause drowsiness so start with just a bedtime  X  two and see how you tolerate it before trying in daytime up to every 4 hours if needed     Gabapentin 100 mg three times a day  If still can't stop coughing Tessalon 200 mg three times and if still coughing > tramadol 50 mg up to 2 every 4 hours    Please schedule a follow up office visit in 2 weeks, call sooner if needed with all medications /inhalers/ solutions in hand so we can verify exactly what you are taking. This includes all medications from all doctors and over the counters - PLEASE separate them into two bags:  the ones you take automatically, no matter what, vs the ones you take just when you feel you need them "BAG #2 is UP TO YOU"  - this will really help us help you take your medications more effectively.

## 2018-09-14 NOTE — Progress Notes (Signed)
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    History of Present Illness  04/02/2018 1st [facility] office visit/   Re cough on ACEi  - not right since 2012  Chief Complaint  Patient presents with  . Pulmonary Consult    Referred by Jessica FettersSylvia Southworth, PA.  Pt c/o SOB for the past month.  She states she sometimes gets winded just walking short distances.  She occ has SOB when she lies down. She also c/o cough with clear sputum- worse at night and wakes her up.  She uses her albuterol inhaler and neb both 1-2 x daily on average.   cough wax and wanes x 6 months 24/7 with freq throat clearing and noct exacerbation  symbicort 80 no better  pred no better  was able to treadmill x two years prior to OV  Now doe x more than room to room and immediate in supine position  rec Stop lisinopril and start benicar 40-25 one daily and your cough should improve Continue Prevacid 30 mg  Take 30-60 min before first meal of the day and add add pepcid 20 mg one hour before bedtime  GERD diet   Please schedule a follow up office visit in 6 weeks, call sooner if needed with all medications /inhalers/ solutions in hand so we can verify exactly what you are taking. This includes all medications from all doctors and over the counters and PFT's on return    Add: have her come back in 1 week for bp check and bmet to see Jessica Snow or Sarah NP     05/15/2018  f/u ov/ re: uacs/ brought most of her meds/ no prevacid  Chief Complaint  Patient presents with  . Follow-up    PFT today,  shortness of breath, productive cough (yellow), frequent headaches  Dyspnea:  MMRC3 = can't walk 100 yards even at a slow pace at a flat grade s stopping due to sob   Cough: 24/7  Min yellow mucus Sleeping: R side down/ 4-5 pillows SABA use: neb and saba  rec Stop symbicort and hfa albuterol for now  For breathing >>> Keep using the nebulizer up to every 4 hours if needed For coughing >>> Take delsym two tsp every 12 hours and supplement if needed with  tramadol 50 mg up to 2 every 4 hours to suppress the urge to cough  - stopped p 3 days    Stop prevacid and start protonix (pantoprazole) 40 mg  Take 30- 60 min before your first and last meals of the day  For drainage / throat tickle stop allegra and try  CHLORPHENIRAMINE  4 mg - take one every 4 hours as needed - available over the counter- may cause drowsiness so start with just a bedtime dose or two and see how you tolerate it before trying in daytime  - took one each am  Prednisone 10 mg take  4 each am x 2 days,   2 each am x 2 days,  1 each am x 2 days and stop  Please schedule a follow up office visit in 2  weeks, sooner if needed  with all medications /inhalers/ solutions in hand so we can verify exactly what you are taking. This includes all medications from all doctors and over the counters- did not bring all meds      06/01/2018 acute extended ov/ re:  Chief Complaint  Patient presents with  . Acute Visit    wheezing, increased SOB, chest soreness and cough with white to yellow sputum x 5 days.   see above med errors.  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 rec  Restart singulair 10 mg daily and Symbicort 80 Take 2 puffs first thing in am and then another 2 puffs about 12 hours later.  Work on inhaler technique:  relax and gently blow  all the way out then take a nice smooth deep breath back in, triggering the inhaler at same time you start breathing in.  Hold for up to 5 seconds if you can. Blow out thru nose. Rinse and gargle with water when done For breathing >>> Keep using the nebulizer up to every 4 hours if needed For coughing >>> Take delsym two tsp every 12 hours and supplement if needed with  tramadol 50 mg up to 2 every 4 hours to suppress the urge to cough. Swallowing water and/or using ice chips/non mint and menthol containing candies (such as lifesavers or sugarless jolly ranchers) are also effective.  You should rest your voice and avoid activities that you know make you cough. Once you have eliminated the cough for 3 straight days try reducing the tramadol first,  then the delsym as tolerated.   Stop prevacid and start protonix (pantoprazole) 40 mg  Take 30- 60 min before your first and last meals of the day  For drainage / throat tickle stop allegra and try  CHLORPHENIRAMINE  4 mg - take one every 4 hours as needed - available over the counter- may cause drowsiness so start with just a bedtime dose or two and see how you tolerate it before trying in daytime   Prednisone 10 mg take  4 each am x 2 days,   2 each am x 2 days,  1 each am x 2 days and stop  See Tammy NP w/in 2 weeks with all your medications> med calendar   Cough better then relapsed mid Oct >  Rib fx 10/22 and referred back to pulmonary by PCP   09/14/2018  f/u ov/ re:  uacs complicated by L Rib fx no med calendar/ prob component of cough variant asthma but hfa quite poor  Chief Complaint  Patient presents with  . Follow-up    Breathing is overall doing well today. She is still coughing some- mainly non prod. She is using her proair inhaler 2 x per wk on average and she uses her neb 2 x daily on average.   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    Also denies any obvious fluctuation of symptoms with weather or environmental changes or other aggravating or alleviating factors except as outlined above   No unusual exposure hx or h/o childhood pna/ asthma or knowledge of premature birth.  Current Allergies, Complete Past Medical History, Past Surgical History, Family History, and Social History were reviewed in [facility] electronic medical record.  ROS  The following are not active complaints unless bolded Hoarseness, sore throat, dysphagia, dental problems, itching, sneezing,  nasal congestion or discharge of excess mucus or purulent secretions, ear ache,   fever, chills, sweats, unintended wt loss or wt gain, classically exertional cp,  orthopnea pnd or arm/hand swelling  or leg swelling, presyncope, palpitations, abdominal pain, anorexia, nausea, vomiting, diarrhea  or change in bowel habits or change in bladder habits, change in stools or change in urine, dysuria, hematuria,  rash, arthralgias, visual complaints, headache, numbness, weakness or ataxia or problems with walking or coordination,  change in mood or  memory.        Current Meds -  - NOTE:   Unable to verify as accurately reflecting what pt takes     Medication Sig  . albuterol (PROAIR HFA) 108 (90 Base) MCG/ACT inhaler Inhale 2 puffs into the lungs every 6 (six) hours as needed for wheezing or shortness of breath.  Marland Kitchen. albuterol (PROVENTIL) (2.5 MG/3ML) 0.083% nebulizer solution 1 vial in neb every 4 hours as needed  . BELVIQ XR 20 MG TB24 Take 1 tablet by mouth daily.  . benzonatate (TESSALON) 200 MG  capsule Take 1 capsule (200 mg total) by mouth 3 (three) times daily as needed for cough.  . budesonide-formoterol (SYMBICORT) 80-4.5 MCG/ACT inhaler Inhale 2 puffs into the lungs 2 (two) times daily.  . famotidine (PEPCID) 20 MG tablet One at bedtime  . montelukast (SINGULAIR) 10 MG tablet Take 1 tablet (10 mg total) by mouth at bedtime.  Marland Kitchen. olmesartan-hydrochlorothiazide (BENICAR HCT) 40-25 MG tablet Take 1 tablet by mouth daily.  . pantoprazole (PROTONIX) 40 MG tablet TAKE 30- 60 MIN BEFORE YOUR FIRST AND LAST MEALS OF THE DAY  . traMADol (ULTRAM) 50 MG tablet Take 1 tablet (50 mg total) by mouth every 4 (four) hours as needed for moderate pain.  . [DISCONTINUED] traMADol (ULTRAM) 50 MG tablet Take 1 tablet (50 mg total) by mouth every 6 (six) hours as needed for moderate pain.                   Objective:   Physical Exam  Obese bf nad but req throat clearing noted   09/14/2018     261  06/01/2018         252  05/15/2018       260   04/02/18 256 lb (116.1 kg)  10/08/13 240 lb (108.9 kg)  01/20/12 231 lb (104.8 kg)    Vital signs reviewed - Note on arrival 02 sats  96% on RA       HEENT: nl dentition, turbinates bilaterally, and oropharynx. Nl external ear canals without cough reflex   NECK :  without JVD/Nodes/TM/ nl carotid upstrokes bilaterally   LUNGS: no acc muscle use,  Nl contour chest which is  clear to A and P bilaterally without cough on insp or exp maneuvers   CV:  RRR  no s3 or murmur or increase in P2, and no edema   ABD:  Obese soft and nontender with nl inspiratory excursion in the supine position. No bruits or organomegaly appreciated, bowel sounds nl  MS:  Nl gait/ ext warm without deformities, calf tenderness, cyanosis or clubbing No obvious joint restrictions   SKIN: warm and dry without lesions    NEURO:  alert, approp, nl sensorium with  no motor or cerebellar deficits apparent.        I personally reviewed images and agree with radiology  impression as follows:  CXR:   No active cardiopulmonary disease.         Assessment:

## 2018-09-14 NOTE — Assessment & Plan Note (Addendum)
Body mass index is 46.23 kg/m.  -  trending up  No results found for: TSH   Contributing to gerd risk/ doe/reviewed the need and the process to achieve and maintain neg calorie balance > defer f/u primary care including intermittently monitoring thyroid status     I had an extended discussion with the patient reviewing all relevant studies completed to date and  lasting 25 minutes of a 40  minute office  visit addressing     re  severe non-specific but potentially very serious refractory respiratory symptoms of uncertain and potentially multiple  Etiologies.  See device teaching which extended face to face time for this visit    Each maintenance medication was reviewed in detail including most importantly the difference between maintenance and prns and under what circumstances the prns are to be triggered using an action plan format that is not reflected in the computer generated alphabetically organized AVS.    Please see AVS for specific instructions unique to this office visit that I personally wrote and verbalized to the the pt in detail and then reviewed with pt  by my nurse highlighting any changes in therapy/plan of care  recommended at today's visit.     Should return with all meds and med calendar to maintain close med reconciliation.

## 2018-09-15 ENCOUNTER — Ambulatory Visit: Payer: Self-pay | Admitting: Internal Medicine

## 2018-09-28 ENCOUNTER — Ambulatory Visit: Payer: BC Managed Care – PPO | Admitting: Internal Medicine

## 2018-09-29 ENCOUNTER — Encounter: Payer: Self-pay | Admitting: Internal Medicine

## 2018-09-29 ENCOUNTER — Ambulatory Visit (INDEPENDENT_AMBULATORY_CARE_PROVIDER_SITE_OTHER): Payer: BC Managed Care – PPO | Admitting: Internal Medicine

## 2018-09-29 VITALS — BP 126/78 | HR 76 | Temp 97.7°F | Ht 63.0 in | Wt 261.2 lb

## 2018-09-29 DIAGNOSIS — R5383 Other fatigue: Secondary | ICD-10-CM | POA: Diagnosis not present

## 2018-09-29 DIAGNOSIS — J45991 Cough variant asthma: Secondary | ICD-10-CM | POA: Diagnosis not present

## 2018-09-29 DIAGNOSIS — F4321 Adjustment disorder with depressed mood: Secondary | ICD-10-CM

## 2018-09-29 DIAGNOSIS — R7309 Other abnormal glucose: Secondary | ICD-10-CM

## 2018-09-29 DIAGNOSIS — I10 Essential (primary) hypertension: Secondary | ICD-10-CM | POA: Diagnosis not present

## 2018-09-29 MED ORDER — HYDROCODONE-HOMATROPINE 5-1.5 MG/5ML PO SYRP: 5.0000 mL | ORAL_SOLUTION | Freq: Four times a day (QID) | ORAL | 0 refills | Status: DC | PRN

## 2018-09-29 MED ORDER — LIDOCAINE 5 % EX PTCH: 1.0000 | MEDICATED_PATCH | Freq: Two times a day (BID) | CUTANEOUS | 1 refills | Status: DC

## 2018-09-29 NOTE — Patient Instructions (Signed)
Adjustment Disorder, Adult Adjustment disorder is a group of symptoms that can develop after a stressful life event, such as the loss of a job or serious physical illness. The symptoms can affect how you feel, think, and act. They may interfere with your relationships. Adjustment disorder increases your risk of suicide and substance abuse. If this disorder is not managed early, it can develop into a more serious condition, such as major depressive disorder or post-traumatic stress disorder. What are the causes? This condition happens when you have trouble recovering from or coping with a stressful life event. What increases the risk? You are more likely to develop this condition if:  You have had depression or anxiety.  You are being treated for a long-term (chronic) illness.  You are being treated for an illness that cannot be cured (terminal illness).  You have a family history of mental illness.  What are the signs or symptoms? Symptoms of this condition include:  Extreme trouble doing daily tasks, such as going to work.  Sadness, depression, or crying spells.  Worrying a lot.  Loss of enjoyment.  Change in appetite or weight.  Feelings of loss or hopelessness.  Thoughts of suicide.  Anxiety, worry, or nervousness.  Trouble sleeping.  Avoiding family and friends.  Fighting or vandalism.  Complaining of feeling sick without being ill.  Feeling dazed or disconnected.  Nightmares.  Trouble sleeping.  Irritability.  Reckless driving.  Poor work performance.  Ignoring bills.  Symptoms of this condition start within three months of the stressful event. They do not last more than six months, unless the stressful circumstances last longer. Normal grieving after the death of a loved one is not a symptom of this condition. How is this diagnosed? To diagnose this condition, your health care provider will ask about what has happened in your life and how it has  affected you. He or she may also ask about your medical history and your use of medicines, alcohol, and other substances. Your health care provider may do a physical exam and order lab tests or other studies. You may be referred to a mental health specialist. How is this treated? Treatment options for this condition include:  Counseling or talk therapy. Talk therapy is usually provided by mental health specialists.  Medicines. Certain medicines may help with depression, anxiety, and sleep.  Support groups. These offer emotional support, advice, and guidance. They are made up of people who have had similar experiences.  Observation and time. This is sometimes called "watchful waiting." In this treatment, health care providers monitor your health and behavior without other treatment. Adjustment disorder sometimes gets better on its own with time.  Follow these instructions at home:  Take over-the-counter and prescription medicines only as told by your health care provider.  Keep all follow-up visits as told by your health care provider. This is important. Contact a health care provider if:  Your symptoms do not improve in six months.  Your symptoms get worse. Get help right away if:  You have serious thoughts about hurting yourself or someone else. If you ever feel like you may hurt yourself or others, or have thoughts about taking your own life, get help right away. You can go to your nearest emergency department or call:  Your local emergency services (911 in the [place]  A suicide crisis helpline, such as the National Suicide Prevention Lifeline at 1-800-273-8255. This is open 24 hours a day.  Summary  Adjustment disorder is a group of   symptoms that can develop after a stressful life event, such as the loss of a job or serious physical illness. The symptoms can affect how you feel, think, and act. They may interfere with your relationships.  Symptoms of this condition start within  three months of the stressful event. They do not last more than six months, unless the stressful circumstances last longer.  Treatment may include talk therapy, medicines, participation in a support group, or observation to see if symptoms improve.  Contact your health care provider if your symptoms get worse or do not improve in six months.  If you ever feel like you may hurt yourself or others, or have thoughts about taking your own life, get help right away. This information is not intended to replace advice given to you by your health care provider. Make sure you discuss any questions you have with your health care provider. Document Released: 06/18/2006 Document Revised: 12/13/2016 Document Reviewed: 12/13/2016 Elsevier Interactive Patient Education  2018 Elsevier Inc.  

## 2018-09-29 NOTE — Progress Notes (Signed)
Subjective:     Patient ID: Jessica Snow , female    DOB: Jan 03, 1966 , 52 y.o.   MRN: [id number]   Chief Complaint  Patient presents with  . Hypertension    HPI  Hypertension  This is a chronic problem. The current episode started more than 1 year ago. The problem has been gradually improving since onset. The problem is controlled. Risk factors for coronary artery disease include obesity, sedentary lifestyle and family history.   She reports she is concerned about recent 2d echo results.   Past Medical History:  Diagnosis Date  . Asthma   . HTN (hypertension)   . Pneumonia      Family History  Problem Relation Age of Onset  . Hypertension Mother   . Diabetes Mother   . Hypertension Father   . Anemia Father      Current Outpatient Medications:  .  albuterol (PROAIR HFA) 108 (90 Base) MCG/ACT inhaler, Inhale 2 puffs into the lungs every 6 (six) hours as needed for wheezing or shortness of breath., Disp: , Rfl:  .  albuterol (PROVENTIL) (2.5 MG/3ML) 0.083% nebulizer solution, 1 vial in neb every 4 hours as needed, Disp: , Rfl:  .  BELVIQ XR 20 MG TB24, Take 1 tablet by mouth daily., Disp: , Rfl: 1 .  benzonatate (TESSALON) 200 MG capsule, Take 1 capsule (200 mg total) by mouth 3 (three) times daily as needed for cough., Disp: 30 capsule, Rfl: 3 .  budesonide-formoterol (SYMBICORT) 80-4.5 MCG/ACT inhaler, Inhale 2 puffs into the lungs 2 (two) times daily., Disp: 1 Inhaler, Rfl: 11 .  famotidine (PEPCID) 20 MG tablet, One at bedtime, Disp: 30 tablet, Rfl: 11 .  gabapentin (NEURONTIN) 100 MG capsule, Take 1 capsule (100 mg total) by mouth 3 (three) times daily. One three times daily, Disp: 90 capsule, Rfl: 2 .  montelukast (SINGULAIR) 10 MG tablet, Take 1 tablet (10 mg total) by mouth at bedtime., Disp: 30 tablet, Rfl: 11 .  olmesartan-hydrochlorothiazide (BENICAR HCT) 40-25 MG tablet, Take 1 tablet by mouth daily., Disp: 30 tablet, Rfl: 11 .  pantoprazole (PROTONIX) 40 MG  tablet, TAKE 30- 60 MIN BEFORE YOUR FIRST AND LAST MEALS OF THE DAY, Disp: 180 tablet, Rfl: 0 .  traMADol (ULTRAM) 50 MG tablet, Take 1 tablet (50 mg total) by mouth every 4 (four) hours as needed for moderate pain., Disp: 40 tablet, Rfl: 0 .  HYDROcodone-homatropine (HYDROMET) 5-1.5 MG/5ML syrup, Take 5 mLs by mouth every 6 (six) hours as needed for cough., Disp: 120 mL, Rfl: 0 .  lidocaine (LIDODERM) 5 %, Place 1 patch onto the skin every 12 (twelve) hours. Remove & Discard patch within 12 hours or as directed by MD, Disp: 60 patch, Rfl: 1   No Known Allergies   Review of Systems  Constitutional: Positive for fatigue.  Respiratory: Positive for cough.   Cardiovascular: Negative.   Gastrointestinal: Negative.   Neurological: Negative.   Psychiatric/Behavioral: Positive for dysphoric mood (she admits to feeling depressed. she is concerned about recent echo results. scheduled to see cardiologist next week. also admits to work and family stressors. has no thoughts to harm herself or others.).     Today's Vitals   09/29/18 1548  BP: 126/78  Pulse: 76  Temp: 97.7 F (36.5 C)  TempSrc: Oral  SpO2: 95%  Weight: 261 lb 3.2 oz (118.5 kg)  Height: 5\' 3"  (1.6 m)  PainSc: 9   PainLoc: Rib Cage   Body mass index is  46.27 kg/m.   Objective:  Physical Exam  Constitutional: She is oriented to person, place, and time. She appears well-developed and well-nourished.  obese  HENT:  Head: Normocephalic and atraumatic.  Cardiovascular: Normal rate, regular rhythm and normal heart sounds.  Pulmonary/Chest: Effort normal and breath sounds normal.  Neurological: She is alert and oriented to person, place, and time.  Psychiatric: She has a normal mood and affect.  Nursing note and vitals reviewed.       Assessment And Plan:     1. Essential hypertension  Controlled. She will continue with current meds.   2. Adjustment disorder with depressed mood  She does not wish to take any  medications at this time. She does agree to referral for therapy.   3. Fatigue, unspecified type  I will check vit b12 level.  Relationship between prolonged use of PPIs and impaired vit b12 absorption was discussed with the patient.   - Vitamin B12  4. Cough variant asthma  She is encouraged to avoid dairy products. I will check food allergy panel. She was also given rx hydromet prn.   - Food Allergy Profile  5. Other abnormal glucose  HER A1C HAS BEEN ELEVATED IN THE PAST. I WILL CHECK AN A1C, BMET TODAY. SHE WAS ENCOURAGED TO AVOID SUGARY BEVERAGES AND PROCESSED FOODS INCLUDNG BREADS, RICE AND PASTA.  - Hemoglobin A1c      [name], MD

## 2018-10-01 ENCOUNTER — Ambulatory Visit: Payer: Self-pay | Admitting: Internal Medicine

## 2018-10-01 LAB — ALLERGENS(96) FOODS
Allergen Banana IgE: <0.1 kU/L
Allergen Barley IgE: <0.1 kU/L
Allergen Blueberry IgE: <0.1 kU/L
Allergen Broccoli: <0.1 kU/L
Allergen Carrot IgE: <0.1 kU/L
Allergen Cauliflower IgE: <0.1 kU/L
Allergen Celery IgE: <0.1 kU/L
Allergen Cinnamon IgE: <0.1 kU/L
Allergen Corn, IgE: <0.1 kU/L
Allergen Cucumber IgE: <0.1 kU/L
Allergen Grape IgE: <0.1 kU/L
Allergen Grapefruit IgE: <0.1 kU/L
Allergen Green Bell Pepper IgE: <0.1 kU/L
Allergen Lamb IgE: <0.1 kU/L
Allergen Oat IgE: <0.1 kU/L
Allergen Pear IgE: <0.1 kU/L
Allergen Rice IgE: <0.1 kU/L
Allergen Salmon IgE: <0.1 kU/L
Allergen Tomato, IgE: <0.1 kU/L
Allergen Turkey IgE: <0.1 kU/L
Allergen, Peach f95: <0.1 kU/L
Basil: <0.1 kU/L
Beef IgE: <0.1 kU/L
C074-IgE Gelatin: <0.1 kU/L
Chicken IgE: <0.1 kU/L
Cranberry IgE: <0.1 kU/L
F023-IgE Crab: <0.1 kU/L
F077-IgE Beta Lactoglobulin: <0.1 kU/L
F096-IgE Avocado: <0.1 kU/L
F222-IgE Tea: <0.1 kU/L
F247-IGE HONEY: 0.17 kU/L — AB
F262-IgE Eggplant: <0.1 kU/L
F278-IgE Bayleaf (Laurel): <0.1 kU/L
F300-IgE Goat's Milk: <0.1 kU/L
F342-IgE Olive, Black: <0.1 kU/L
F343-IgE Raspberry: <0.1 kU/L
Lemon: <0.1 kU/L
Mushroom IgE: <0.1 kU/L
Peanut IgE: <0.1 kU/L
Pumpkin IgE: <0.1 kU/L
Scallop IgE: <0.1 kU/L
Sesame Seed IgE: <0.1 kU/L
Shrimp IgE: <0.1 kU/L
Tuna: <0.1 kU/L
WHEAT IGE: 0.11 kU/L — AB
Walnut IgE: <0.1 kU/L
White Bean IgE: <0.1 kU/L

## 2018-10-01 LAB — HEMOGLOBIN A1C
Est. average glucose Bld gHb Est-mCnc: 140 mg/dL
HEMOGLOBIN A1C: 6.5 % — AB (ref 4.8–5.6)

## 2018-10-01 LAB — VITAMIN B12: VITAMIN B 12: 837 pg/mL (ref 232–1245)

## 2018-10-01 NOTE — Progress Notes (Signed)
Here are your lab results:  Your vitamin b12 level is within normal limits.  Your a1c is 6.5, you have now become a new onset diabetic. Are you willing to take medication for this? I would like for you to take a once weekly injection that could help control your blood sugars and help with weight loss.  Let me know how you wish to proceed!  You seem to have a slight allergy to honey. You are also allergy to wheat. Please remove wheat and wheat products from your diet. This includes breads, breading *on chicken nugget, etc).   This will also help with your weight loss.   Happy holidays to you and your family!  Sincerely,    Demeshia Sherburne N. Allyne GeeSanders, MD

## 2018-10-09 ENCOUNTER — Ambulatory Visit: Payer: BC Managed Care – PPO | Admitting: Cardiology

## 2018-10-09 ENCOUNTER — Encounter: Payer: Self-pay | Admitting: Cardiology

## 2018-10-09 VITALS — BP 110/82 | HR 62 | Ht 63.0 in | Wt 260.8 lb

## 2018-10-09 DIAGNOSIS — R0609 Other forms of dyspnea: Secondary | ICD-10-CM | POA: Diagnosis not present

## 2018-10-09 DIAGNOSIS — I10 Essential (primary) hypertension: Secondary | ICD-10-CM

## 2018-10-09 DIAGNOSIS — R0600 Dyspnea, unspecified: Secondary | ICD-10-CM

## 2018-10-09 DIAGNOSIS — J209 Acute bronchitis, unspecified: Secondary | ICD-10-CM | POA: Diagnosis not present

## 2018-10-09 MED ORDER — FUROSEMIDE 40 MG PO TABS: 40.0000 mg | ORAL_TABLET | Freq: Every day | ORAL | 3 refills | Status: DC

## 2018-10-09 MED ORDER — OLMESARTAN MEDOXOMIL 20 MG PO TABS: 20.0000 mg | ORAL_TABLET | Freq: Every day | ORAL | 3 refills | Status: DC

## 2018-10-09 MED ORDER — SPIRONOLACTONE 25 MG PO TABS: 12.5000 mg | ORAL_TABLET | Freq: Every day | ORAL | 3 refills | Status: DC

## 2018-10-09 MED ORDER — PREDNISONE 20 MG PO TABS: ORAL_TABLET | ORAL | 0 refills | Status: DC

## 2018-10-09 NOTE — Patient Instructions (Addendum)
Medication Instructions:  Your physician has recommended you make the following change in your medication:   1. STOP: olmesartan-hydrochlorothiazide (benicar hct)  2. START: furosemide (lasix) 40 mg tablet: Take 1 tablet (40 mg total) by mouth once a day  3. START: spironolactone 25 mg tablet: Take 1/2 tablet (12.5 mg total) by mouth once a day  4. START: olmesartan 20 mg tablet: Take 1 tablet (20 mg total) by mouth once a day  5. TAKE: prednisone 20 mg tablet as directed:  - Take 2 tablets (40 mg total) for 2 days  - Take 1 tablet (20 mg total) for 2 days  - Take 1/2 tablet (10 mg total) for 1 day If you need a refill on your cardiac medications before your next appointment, please call your pharmacy.   Lab work: TODAY: BMET, CBC, BNP  If you have labs (blood work) drawn today and your tests are completely normal, you will receive your results only by: Marland Kitchen. MyChart Message (if you have MyChart) OR . A paper copy in the mail If you have any lab test that is abnormal or we need to change your treatment, we will call you to review the results.  Testing/Procedures: Your physician has requested that you have cardiac CT. Cardiac computed tomography (CT) is a painless test that uses an x-ray machine to take clear, detailed pictures of your heart. For further information please visit [email or web address]. Please follow instruction sheet as given.   Follow-Up: . Follow up with Dr. Delton SeeNelson on 11/12/18 at 10:00 AM  Any Other Special Instructions Will Be Listed Below (If Applicable).  CARDIAC CT INSTRUCTIONS  Please [facility] the [facility] of [facility] at xx:xx AM (30-45 minutes prior to test start time)  [facility] [address] [place], [place] [address] [phone]  Proceed to the [facility] Radiology Department (First Floor).  Please follow these instructions carefully (unless otherwise directed):   On the Night Before the Test: . Be sure  to Drink plenty of water. . Do not consume any caffeinated/decaffeinated beverages or chocolate 12 hours prior to your test. . Do not take any antihistamines 12 hours prior to your test.  On the Day of the Test: . Drink plenty of water. Do not drink any water within one hour of the test. . Do not eat any food 4 hours prior to the test. . You may take your regular medications prior to the test.  . HOLD Furosemide (lasix) the morning of the test. HOLD Spironolactone the morning of the test.      After the Test: . Drink plenty of water. . After receiving IV contrast, you may experience a mild flushed feeling. This is normal. . On occasion, you may experience a mild rash up to 24 hours after the test. This is not dangerous. If this occurs, you can take Benadryl 25 mg and increase your fluid intake. . If you experience trouble breathing, this can be serious. If it is severe call 911 IMMEDIATELY. If it is mild, please call our office.

## 2018-10-09 NOTE — Progress Notes (Signed)
Cardiology Office Note:    Date:  10/13/2018   ID:  ALMETER WESTHOFF, DOB 01-Jan-1966, MRN [id number]  PCP:  Dorothyann Peng, MD  Cardiologist:  New patient - Dr Delton See  Referring MD: Dorothyann Peng, MD   Chief complain: DOE, hypertension  History of Present Illness:    STEFANNY PIERI is a 52 y.o. female with a hx of asthma, hypertension who is presenting with progressively worsening DOE. She was recently diagnosed with asthma and started on inhaled steroids and albuterol with no significant improvement. She continues to feel SOB with short distances. She was recently treated for an acute bronchitis with no significant improvement. She has a distant h/o smoking. She also complaints of LE edema, very heavy legs.   Past Medical History:  Diagnosis Date  . Asthma   . HTN (hypertension)   . Pneumonia     Past Surgical History:  Procedure Laterality Date  . TOTAL VAGINAL HYSTERECTOMY  2007    Current Medications: Current Meds  Medication Sig  . albuterol (PROAIR HFA) 108 (90 Base) MCG/ACT inhaler Inhale 2 puffs into the lungs every 6 (six) hours as needed for wheezing or shortness of breath.  Marland Kitchen albuterol (PROVENTIL) (2.5 MG/3ML) 0.083% nebulizer solution 1 vial in neb every 4 hours as needed  . BELVIQ XR 20 MG TB24 Take 1 tablet by mouth daily.  . benzonatate (TESSALON) 200 MG capsule Take 1 capsule (200 mg total) by mouth 3 (three) times daily as needed for cough.  . budesonide-formoterol (SYMBICORT) 80-4.5 MCG/ACT inhaler Inhale 2 puffs into the lungs 2 (two) times daily.  . famotidine (PEPCID) 20 MG tablet One at bedtime  . gabapentin (NEURONTIN) 100 MG capsule Take 1 capsule (100 mg total) by mouth 3 (three) times daily. One three times daily  . lidocaine (LIDODERM) 5 % Place 1 patch onto the skin every 12 (twelve) hours. Remove & Discard patch within 12 hours or as directed by MD  . montelukast (SINGULAIR) 10 MG tablet Take 1 tablet (10 mg total) by mouth at bedtime.  .  pantoprazole (PROTONIX) 40 MG tablet TAKE 30- 60 MIN BEFORE YOUR FIRST AND LAST MEALS OF THE DAY  . traMADol (ULTRAM) 50 MG tablet Take 1 tablet (50 mg total) by mouth every 4 (four) hours as needed for moderate pain.  . [DISCONTINUED] olmesartan-hydrochlorothiazide (BENICAR HCT) 40-25 MG tablet Take 1 tablet by mouth daily.     Allergies:   Patient has no known allergies.   Social History   Socioeconomic History  . Marital status: Single    Spouse name: Not on file  . Number of children: Not on file  . Years of education: Not on file  . Highest education level: Not on file  Occupational History  . Occupation: Production designer, theatre/television/film at [employer].    Employer: [employer]  Social Needs  . Financial resource strain: Not on file  . Food insecurity:    Worry: Not on file    Inability: Not on file  . Transportation needs:    Medical: Not on file    Non-medical: Not on file  Tobacco Use  . Smoking status: Former Smoker    Packs/day: 0.25    Years: 10.00    Pack years: 2.50    Types: Cigarettes    Last attempt to quit: 01/26/2018    Years since quitting: 0.7  . Smokeless tobacco: Former Engineer, water and Sexual Activity  . Alcohol use: No  . Drug use: No  . Sexual activity:  Not on file  Lifestyle  . Physical activity:    Days per week: Not on file    Minutes per session: Not on file  . Stress: Not on file  Relationships  . Social connections:    Talks on phone: Not on file    Gets together: Not on file    Attends religious service: Not on file    Active member of club or organization: Not on file    Attends meetings of clubs or organizations: Not on file    Relationship status: Not on file  Other Topics Concern  . Not on file  Social History Narrative   Lives with two children.       Family History: The patient's family history includes Anemia in her father; Diabetes in her mother; Hypertension in her father and mother.  ROS:   Please see the history of present illness.    All other  systems reviewed and are negative.  EKGs/Labs/Other Studies Reviewed:    The following studies were reviewed today:  EKG:  EKG is ordered today.  The ekg ordered today demonstrates NSR, iRBBB, LVH, personally reviewed  Recent Labs: 08/18/2018: ALT 37 10/09/2018: BUN 8; Creatinine, Ser 0.64; Hemoglobin 12.4; NT-Pro BNP 210; Platelets 253; Potassium 4.3; Sodium 143  Recent Lipid Panel No results found for: CHOL, TRIG, HDL, CHOLHDL, VLDL, LDLCALC, LDLDIRECT  Physical Exam:    VS:  BP 110/82   Pulse 62   Ht 5\' 3"  (1.6 m)   Wt 260 lb 12.8 oz (118.3 kg)   LMP 12/19/2005   SpO2 96%   BMI 46.20 kg/m     Wt Readings from Last 3 Encounters:  10/09/18 260 lb 12.8 oz (118.3 kg)  09/29/18 261 lb 3.2 oz (118.5 kg)  09/14/18 261 lb (118.4 kg)     GEN: Well nourished, well developed in no acute distress HEENT: Normal NECK: No JVD; No carotid bruits LYMPHATICS: No lymphadenopathy CARDIAC:RRR, no murmurs, rubs, gallops RESPIRATORY:  Clear to auscultation without rales, wheezing B/L  ABDOMEN: Soft, non-tender, non-distended MUSCULOSKELETAL:  B/L LE edema; No deformity  SKIN: Warm and dry NEUROLOGIC:  Alert and oriented x 3 PSYCHIATRIC:  Normal affect   ASSESSMENT:    1. DOE (dyspnea on exertion)   2. Essential hypertension   3. Acute bronchitis, unspecified organism    PLAN:    In order of problems listed above:  1. Acute bronchitis - start prednisone taper 40 mg po x 2 days, 20 mg po x 2 days, 10 mg po x 1 day 2. DOE - order coronary CTA 3. Hypertension, LE edema - start lasix 40 mg po daily, spironolactone 12.5 mg po daily, d/c HCTZ, start olmesartan 20 mg po daily.  Labs today, follow up in 6 weeks.   Medication Adjustments/Labs and Tests Ordered: Current medicines are reviewed at length with the patient today.  Concerns regarding medicines are outlined above.  Orders Placed This Encounter  Procedures  . CT CORONARY FRACTIONAL FLOW RESERVE DATA PREP  . CT CORONARY  FRACTIONAL FLOW RESERVE FLUID ANALYSIS  . CT CORONARY MORPH W/CTA COR W/SCORE W/CA W/CM &/OR WO/CM  . Basic metabolic panel  . CBC  . Pro b natriuretic peptide (BNP)  . EKG 12-Lead   Meds ordered this encounter  Medications  . furosemide (LASIX) 40 MG tablet    Sig: Take 1 tablet (40 mg total) by mouth daily.    Dispense:  90 tablet    Refill:  3  . spironolactone (  ALDACTONE) 25 MG tablet    Sig: Take 0.5 tablets (12.5 mg total) by mouth daily.    Dispense:  45 tablet    Refill:  3  . olmesartan (BENICAR) 20 MG tablet    Sig: Take 1 tablet (20 mg total) by mouth daily.    Dispense:  90 tablet    Refill:  3  . predniSONE (DELTASONE) 20 MG tablet    Sig: Take 2 tablets (40 mg total) for 2 days, then take 1 tablet (20 mg total) for 2 days, then take 1/2 tablet (10 mg total) for 1 day    Dispense:  8 tablet    Refill:  0    Patient Instructions  Medication Instructions:  Your physician has recommended you make the following change in your medication:   1. STOP: olmesartan-hydrochlorothiazide (benicar hct)  2. START: furosemide (lasix) 40 mg tablet: Take 1 tablet (40 mg total) by mouth once a day  3. START: spironolactone 25 mg tablet: Take 1/2 tablet (12.5 mg total) by mouth once a day  4. START: olmesartan 20 mg tablet: Take 1 tablet (20 mg total) by mouth once a day  5. TAKE: prednisone 20 mg tablet as directed:  - Take 2 tablets (40 mg total) for 2 days  - Take 1 tablet (20 mg total) for 2 days  - Take 1/2 tablet (10 mg total) for 1 day If you need a refill on your cardiac medications before your next appointment, please call your pharmacy.   Lab work: TODAY: BMET, CBC, BNP  If you have labs (blood work) drawn today and your tests are completely normal, you will receive your results only by: Marland Kitchen. MyChart Message (if you have MyChart) OR . A paper copy in the mail If you have any lab test that is abnormal or we need to change your treatment, we will call you to review  the results.  Testing/Procedures: Your physician has requested that you have cardiac CT. Cardiac computed tomography (CT) is a painless test that uses an x-ray machine to take clear, detailed pictures of your heart. For further information please visit [email or web address]. Please follow instruction sheet as given.   Follow-Up: . Follow up with Dr. Delton SeeNelson on 11/12/18 at 10:00 AM  Any Other Special Instructions Will Be Listed Below (If Applicable).  CARDIAC CT INSTRUCTIONS  Please arrive at the [facility] of [facility] at xx:xx AM (30-45 minutes prior to test start time)  [facility] [address] [place], [place] [address] [phone]  Proceed to the [facility] Radiology Department (First Floor).  Please follow these instructions carefully (unless otherwise directed):   On the Night Before the Test: . Be sure to Drink plenty of water. . Do not consume any caffeinated/decaffeinated beverages or chocolate 12 hours prior to your test. . Do not take any antihistamines 12 hours prior to your test.  On the Day of the Test: . Drink plenty of water. Do not drink any water within one hour of the test. . Do not eat any food 4 hours prior to the test. . You may take your regular medications prior to the test.  . HOLD Furosemide (lasix) the morning of the test. HOLD Spironolactone the morning of the test.      After the Test: . Drink plenty of water. . After receiving IV contrast, you may experience a mild flushed feeling. This is normal. . On occasion, you may experience a mild rash up  to 24 hours after the test. This is not dangerous. If this occurs, you can take Benadryl 25 mg and increase your fluid intake. . If you experience trouble breathing, this can be serious. If it is severe call 911 IMMEDIATELY. If it is mild, please call our office.      Signed, Tobias Alexander, MD  10/13/2018 12:31 AM    [facility]

## 2018-10-10 LAB — BASIC METABOLIC PANEL
BUN/Creatinine Ratio: 13 (ref 9–23)
BUN: 8 mg/dL (ref 6–24)
CO2: 23 mmol/L (ref 20–29)
Calcium: 9.3 mg/dL (ref 8.7–10.2)
Chloride: 106 mmol/L (ref 96–106)
Creatinine, Ser: 0.64 mg/dL (ref 0.57–1.00)
GFR calc Af Amer: 120 mL/min/{1.73_m2} (ref >59)
GFR calc non Af Amer: 104 mL/min/{1.73_m2} (ref >59)
Glucose: 87 mg/dL (ref 65–99)
Potassium: 4.3 mmol/L (ref 3.5–5.2)
Sodium: 143 mmol/L (ref 134–144)

## 2018-10-10 LAB — CBC
Hematocrit: 38.3 % (ref 34.0–46.6)
Hemoglobin: 12.4 g/dL (ref 11.1–15.9)
MCH: 25.5 pg — ABNORMAL LOW (ref 26.6–33.0)
MCHC: 32.4 g/dL (ref 31.5–35.7)
MCV: 79 fL (ref 79–97)
Platelets: 253 10*3/uL (ref 150–450)
RBC: 4.86 x10E6/uL (ref 3.77–5.28)
RDW: 15.8 % — ABNORMAL HIGH (ref 12.3–15.4)
WBC: 7.4 10*3/uL (ref 3.4–10.8)

## 2018-10-10 LAB — PRO B NATRIURETIC PEPTIDE: NT-Pro BNP: 210 pg/mL (ref 0–249)

## 2018-10-13 ENCOUNTER — Telehealth: Payer: Self-pay | Admitting: *Deleted

## 2018-10-13 NOTE — Telephone Encounter (Signed)
Pt called back and s/w Merlinda FrederickAnn Bourne, RN and has been notified of lab results by phone with verbal understanding. Pt thanked us for the call.

## 2018-10-13 NOTE — Telephone Encounter (Signed)
Left message to go over lab results.  

## 2018-10-13 NOTE — Telephone Encounter (Signed)
-----   Message from Loa SocksIvy M Martin, LPN sent at [id number]/01/027212/17/2019  8:05 AM EST -----  ----- Message ----- From: Lars MassonNelson, Katarina H, MD Sent: 10/13/2018  12:31 AM EST To: Loa SocksIvy M Martin, LPN  All labs normal including BNP - continue meds as advised at the last visit

## 2018-10-30 ENCOUNTER — Telehealth (HOSPITAL_COMMUNITY): Payer: Self-pay | Admitting: Emergency Medicine

## 2018-10-30 NOTE — Telephone Encounter (Signed)
Pt returned call --Reaching out to patient to offer assistance regarding upcoming cardiac imaging study; pt verbalizes understanding of appt date/time, parking situation and where to check in, pre-test NPO status and medications ordered, and verified current allergies; name and call back number provided for further questions should they arise Rockwell Alexandria RN Navigator Cardiac Imaging [phone]

## 2018-10-30 NOTE — Telephone Encounter (Signed)
Reaching out to patient to offer assistance regarding upcoming cardiac imaging study; unable to reach patient and there is no answering service on her phone. Will try again. Rockwell Alexandria RN Navigator Cardiac Imaging [phone]

## 2018-11-03 ENCOUNTER — Ambulatory Visit (HOSPITAL_COMMUNITY)
Admission: RE | Admit: 2018-11-03 | Discharge: 2018-11-03 | Disposition: A | Discharge status: Home / Self Care | Payer: BC Managed Care – PPO | Source: Ambulatory Visit | Attending: Cardiology | Admitting: Cardiology

## 2018-11-03 ENCOUNTER — Ambulatory Visit (HOSPITAL_COMMUNITY): Admission: RE | Admit: 2018-11-03 | Payer: BC Managed Care – PPO | Source: Ambulatory Visit

## 2018-11-03 DIAGNOSIS — R0609 Other forms of dyspnea: Secondary | ICD-10-CM | POA: Insufficient documentation

## 2018-11-03 DIAGNOSIS — I10 Essential (primary) hypertension: Secondary | ICD-10-CM

## 2018-11-03 DIAGNOSIS — R0600 Dyspnea, unspecified: Secondary | ICD-10-CM

## 2018-11-03 DIAGNOSIS — Z006 Encounter for examination for normal comparison and control in clinical research program: Secondary | ICD-10-CM

## 2018-11-03 IMAGING — CT CT HEART MORP W/ CTA COR W/ SCORE W/ CA W/CM &/OR W/O CM
2 of 8 series · 8 of 20 positions shown, 9 images · non-contrast
Comparison: None

Addendum:
CLINICAL DATA: 52-year-old female with morbid obesity (BMI 46),
hypertension, FH of early CAD and dyspnea on exertion.

EXAM:
Cardiac/Coronary  CT
TECHNIQUE: The patient was scanned on a Phillips Force scanner.

[Series 10: best diast · axial · 0.35mm/px · z∈[-334,-267]mm · 4 of 281 slices shown, 5 images]
[im 57/281  vessel]
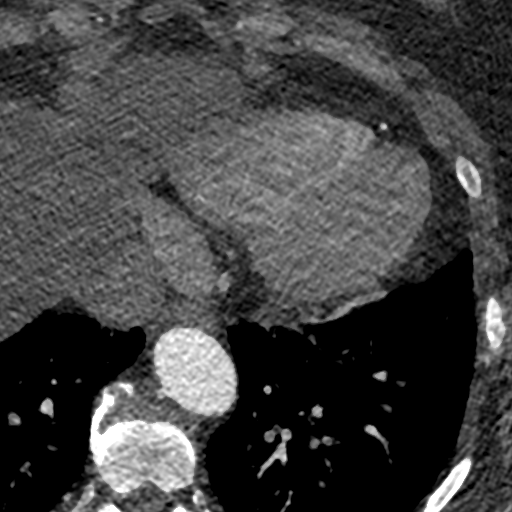
[im 57/281  lung]
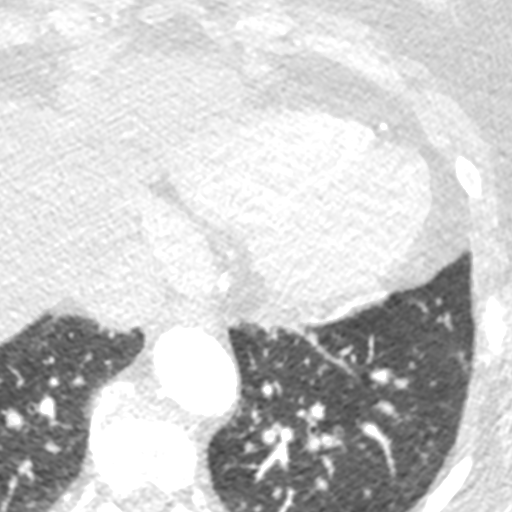
[im 113/281  vessel]
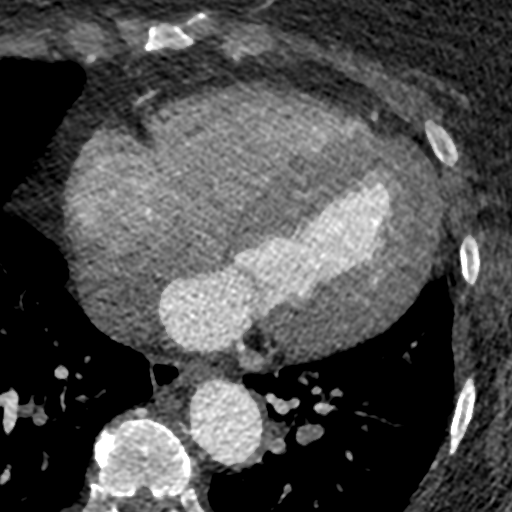
[im 169/281  vessel]
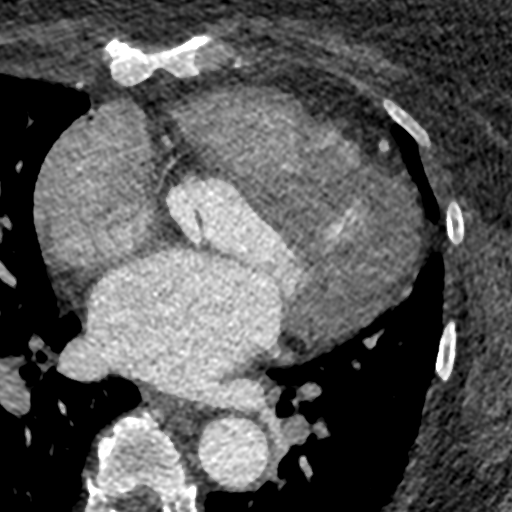
[im 225/281  vessel]
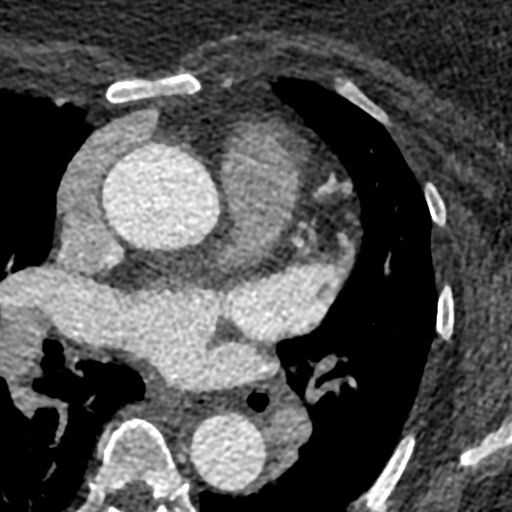

[Series 11: +300 ms · axial · 0.35mm/px · z∈[-334,-267]mm · 4 of 281 slices shown]
[im 57/281  vessel]
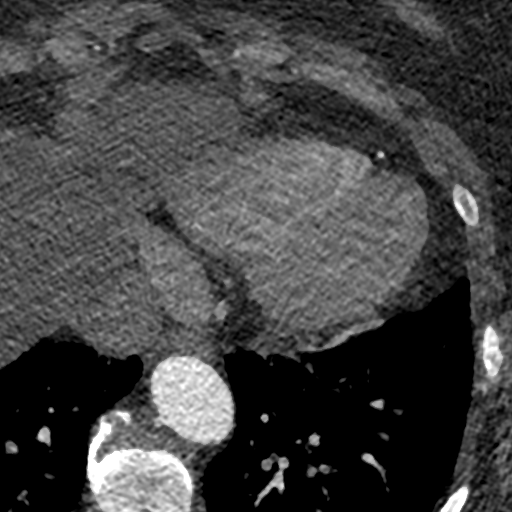
[im 113/281  vessel]
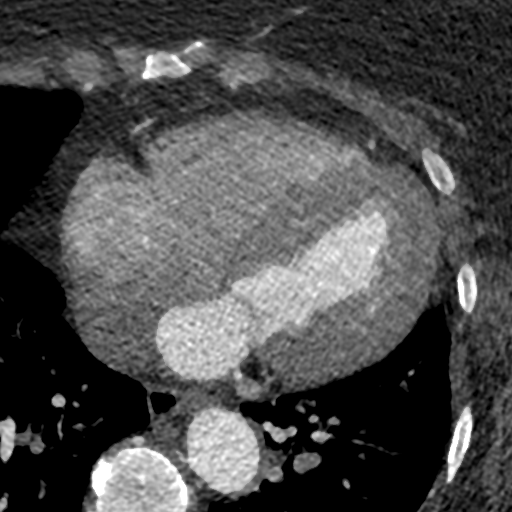
[im 169/281  vessel]
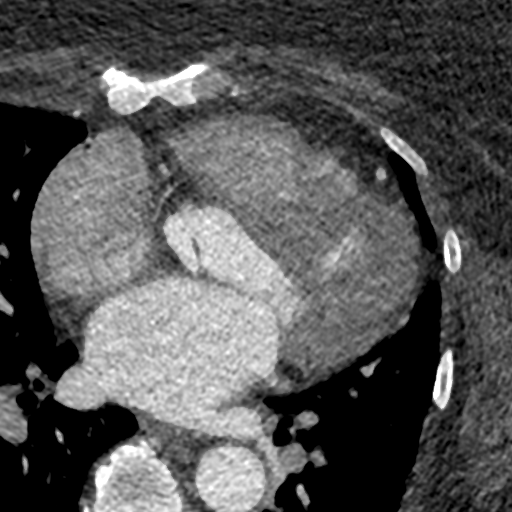
[im 225/281  vessel]
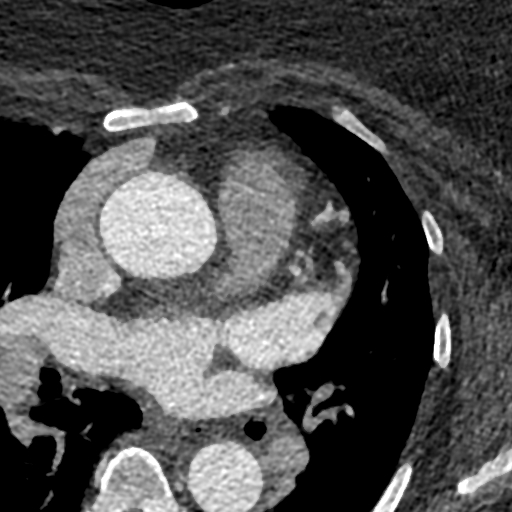

[8 of 20 positions shown; findings below may reference images not displayed]



This is a poor quality study secondary to patient's size and motion.

Aorta:  Normal size.  No calcifications.  No dissection.

Aortic Valve:  Trileaflet.  No calcifications.

Coronary Arteries:  Normal coronary origin.  Right dominance.

Left main is a large artery that gives rise to LAD and LCX arteries.
Left main has no plaque.

LAD is a large vessel that wraps around the apex where it supplies
part of PDA territory. There is no obvious.

LCX is a large dominant artery that gives rise to one small OM1
branch and PDA. PLA is not visualized there is motion in the LCX
artery but no obvious plaque in the visualized portions of the
artery.

RCA is a small non-dominant artery.

Other findings:

Normal pulmonary vein drainage into the left atrium.

Normal let atrial appendage without a thrombus.
IMPRESSION: 1. Coronary calcium score of 0. This was 0 percentile for age and
sex matched control.

2. Normal coronary origin with left dominance.

3. This is a poor quality study secondary to patient's size and
motion. However, there no evidence of CAD in the visualized portions
of the coronary arteries.

4. Dilated pulmonary artery measuring 35 mm suggestive of pulmonary
hypertension.

EXAM:
OVER-READ INTERPRETATION  CT CHEST

The following report is an over-read performed by radiologist Dr.
BAJWA [REDACTED] on [DATE]. This over-read
does not include interpretation of cardiac or coronary anatomy or
pathology. The coronary CTA interpretation by the cardiologist is
attached.
FINDINGS: Heart is mildly enlarged. Visualized ascending aorta upper limits
normal in diameter at 3.9 cm. No adenopathy in the lower mediastinum
or hila. Visualized lungs clear.

Imaging into the upper abdomen shows no acute findings. Possible
fatty infiltration of the liver.

Old posterior left rib fracture with nonunion. No acute bony
abnormality. Chest wall soft tissues are unremarkable.
IMPRESSION: No acute extra cardiac abnormality.

Old posterior left mid rib fracture with nonunion.

Ascending aorta upper limits normal in diameter at 3.9 cm.

*** End of Addendum ***

## 2018-11-03 MED ORDER — NITROGLYCERIN 0.4 MG SL SUBL
0.8000 mg | SUBLINGUAL_TABLET | Freq: Once | SUBLINGUAL | Status: DC
  Filled 2018-11-03: qty 25

## 2018-11-03 MED ORDER — IOPAMIDOL (ISOVUE-370) INJECTION 76%
100.0000 mL | Freq: Once | INTRAVENOUS | Status: AC | PRN
  Administered 2018-11-03: 100 mL via INTRAVENOUS

## 2018-11-03 MED ORDER — METOPROLOL TARTRATE 5 MG/5ML IV SOLN
INTRAVENOUS | Status: AC
  Filled 2018-11-03: qty 5

## 2018-11-03 MED ORDER — NITROGLYCERIN 0.4 MG SL SUBL
SUBLINGUAL_TABLET | SUBLINGUAL | Status: AC
  Filled 2018-11-03: qty 2

## 2018-11-03 MED ORDER — METOPROLOL TARTRATE 5 MG/5ML IV SOLN
5.0000 mg | INTRAVENOUS | Status: DC | PRN
  Filled 2018-11-03: qty 5

## 2018-11-03 NOTE — Research (Signed)
Jessica Snow met inclusion and exclusion criteria.  The informed consent form, study requirements and expectations were reviewed with the subject and questions and concerns were addressed prior to the signing of the consent form.  The subject verbalized understanding of the trial requirements.  The subject agreed to participate in the CADFEM trial and signed the informed consent.  The informed consent was obtained prior to performance of any protocol-specific procedures for the subject.  A copy of the signed informed consent was given to the subject and a copy was placed in the subject's medical record.   Dionne Bucy. [employer]

## 2018-11-12 ENCOUNTER — Encounter: Payer: Self-pay | Admitting: *Deleted

## 2018-11-12 ENCOUNTER — Ambulatory Visit: Payer: Self-pay | Admitting: Cardiology

## 2018-11-19 ENCOUNTER — Other Ambulatory Visit: Payer: Self-pay | Admitting: Internal Medicine

## 2018-12-03 ENCOUNTER — Ambulatory Visit: Payer: BC Managed Care – PPO | Admitting: Internal Medicine

## 2018-12-08 NOTE — Progress Notes (Signed)
Cardiology Office Note    Date:  12/09/2018   ID:  Jessica Snow, DOB 1966/04/19, MRN [id number]  PCP:  Dorothyann Peng, MD  Cardiologist: Tobias Alexander, MD EPS: None  No chief complaint on file.   History of Present Illness:  Jessica Snow is a 53 y.o. female history of hypertension, asthma, worsening dyspnea on exertion.  Patient saw Dr. Delton See 10/09/2018 who thought she had acute bronchitis and gave her a steroid taper and started her on Lasix, Spironolactone, and olmesartan and stopped her HCTZ.  She also ordered a coronary CTA.  Calcium score was 0 previous, poor quality study secondary to patient size and motion however no evidence of CAD, dilated pulmonary artery measuring 35 mm suggestive of pulmonary hypertension.  Echo 07/2018 normal LVEF 60 to 65% with grade 2 DD and mildly dilated a sending aorta 40 mm.  Patient went to the [facility] ER 11/15/2018 for prolonged coughing.  Chest x-ray that day showed some vascular congestion and question of infiltrates.  Renal function and potassium were normal that day.  She still struggles a lot with asthma and wheezing.  Overall her swelling has improved and blood pressure is better.  She has some days that her legs swell but is usually when she is on her feet all day at work.  She has struggled to lose weight and is on Belviq XR to help with this but she does not think it is working.    Past Medical History:  Diagnosis Date  . Asthma   . HTN (hypertension)   . Pneumonia     Past Surgical History:  Procedure Laterality Date  . TOTAL VAGINAL HYSTERECTOMY  2007    Current Medications: Current Meds  Medication Sig  . albuterol (PROAIR HFA) 108 (90 Base) MCG/ACT inhaler Inhale 2 puffs into the lungs every 6 (six) hours as needed for wheezing or shortness of breath.  Marland Kitchen albuterol (PROVENTIL) (2.5 MG/3ML) 0.083% nebulizer solution 1 vial in neb every 4 hours as needed  . BELVIQ XR 20 MG TB24 Take 1 tablet by mouth daily.  .  budesonide-formoterol (SYMBICORT) 80-4.5 MCG/ACT inhaler Inhale 2 puffs into the lungs 2 (two) times daily.  . famotidine (PEPCID) 20 MG tablet One at bedtime  . furosemide (LASIX) 40 MG tablet Take 1 tablet (40 mg total) by mouth daily.  Marland Kitchen gabapentin (NEURONTIN) 100 MG capsule Take 1 capsule (100 mg total) by mouth 3 (three) times daily. One three times daily  . lidocaine (LIDODERM) 5 % Place 1 patch onto the skin every 12 (twelve) hours. Remove & Discard patch within 12 hours or as directed by MD  . montelukast (SINGULAIR) 10 MG tablet Take 1 tablet (10 mg total) by mouth at bedtime.  Marland Kitchen olmesartan (BENICAR) 20 MG tablet Take 1 tablet (20 mg total) by mouth daily.  . pantoprazole (PROTONIX) 40 MG tablet TAKE 30- 60 MIN BEFORE YOUR FIRST AND LAST MEALS OF THE DAY  . spironolactone (ALDACTONE) 25 MG tablet Take 0.5 tablets (12.5 mg total) by mouth daily.  . traMADol (ULTRAM) 50 MG tablet Take 1 tablet (50 mg total) by mouth every 4 (four) hours as needed for moderate pain.     Allergies:   Patient has no known allergies.   Social History   Socioeconomic History  . Marital status: Single    Spouse name: Not on file  . Number of children: Not on file  . Years of education: Not on file  . Highest education level:  Not on file  Occupational History  . Occupation: Production designer, theatre/television/film at [employer].    Employer: [employer]  Social Needs  . Financial resource strain: Not on file  . Food insecurity:    Worry: Not on file    Inability: Not on file  . Transportation needs:    Medical: Not on file    Non-medical: Not on file  Tobacco Use  . Smoking status: Former Smoker    Packs/day: 0.25    Years: 10.00    Pack years: 2.50    Types: Cigarettes    Last attempt to quit: 01/26/2018    Years since quitting: 0.8  . Smokeless tobacco: Former Engineer, water and Sexual Activity  . Alcohol use: No  . Drug use: No  . Sexual activity: Not on file  Lifestyle  . Physical activity:    Days per week: Not on file     Minutes per session: Not on file  . Stress: Not on file  Relationships  . Social connections:    Talks on phone: Not on file    Gets together: Not on file    Attends religious service: Not on file    Active member of club or organization: Not on file    Attends meetings of clubs or organizations: Not on file    Relationship status: Not on file  Other Topics Concern  . Not on file  Social History Narrative   Lives with two children.       Family History:  The patient's family history includes Anemia in her father; Diabetes in her mother; Hypertension in her father and mother.   ROS:   Please see the history of present illness.    Review of Systems  Constitution: Negative.  HENT: Negative.   Eyes: Negative.   Cardiovascular: Positive for dyspnea on exertion.  Respiratory: Positive for cough, sleep disturbances due to breathing and snoring.   Hematologic/Lymphatic: Negative.   Musculoskeletal: Negative.  Negative for joint pain.  Gastrointestinal: Negative.   Genitourinary: Negative.   Neurological: Negative.    All other systems reviewed and are negative.   PHYSICAL EXAM:   VS:  BP 140/70 (BP Location: Right Arm, Patient Position: Sitting, Cuff Size: Large)   Pulse 68   Ht 5\' 3"  (1.6 m)   Wt 256 lb 12.8 oz (116.5 kg)   LMP 12/19/2005   SpO2 97% Comment: at rest  BMI 45.49 kg/m   Physical Exam  GEN: Obese, in no acute distress  Neck: no JVD, carotid bruits, or masses Cardiac:RRR; 2/6 to 3/6 systolic murmur left sternal border Respiratory: Decreased breath sounds throughout without rales or rhonchi, upper respiratory wheezing  GI: soft, nontender, nondistended, + BS Ext: without cyanosis, clubbing, or edema, Good distal pulses bilaterally Neuro:  Alert and Oriented x 3 Psych: euthymic mood, full affect  Wt Readings from Last 3 Encounters:  12/09/18 256 lb 12.8 oz (116.5 kg)  10/09/18 260 lb 12.8 oz (118.3 kg)  09/29/18 261 lb 3.2 oz (118.5 kg)      Studies/Labs  Reviewed:   EKG:  EKG is not ordered today.   Recent Labs: 08/18/2018: ALT 37 10/09/2018: BUN 8; Creatinine, Ser 0.64; Hemoglobin 12.4; NT-Pro BNP 210; Platelets 253; Potassium 4.3; Sodium 143   Lipid Panel No results found for: CHOL, TRIG, HDL, CHOLHDL, VLDL, LDLCALC, LDLDIRECT  Additional studies/ records that were reviewed today include:  CT 11/03/2018 IMPRESSION: 1. Coronary calcium score of 0. This was 0 percentile for age and sex matched  control.   2. Normal coronary origin with left dominance.   3. This is a poor quality study secondary to patient's size and motion. However, there no evidence of CAD in the visualized portions of the coronary arteries.   4. Dilated pulmonary artery measuring 35 mm suggestive of pulmonary hypertension.   Electronically Signed: By: Tobias Alexander On: 11/03/2018 15:14   2D echo 07/2018 Study Conclusions   - Left ventricle: The cavity size was normal. There was mild focal   basal hypertrophy of the septum. Systolic function was normal.   The estimated ejection fraction was in the range of 60% to 65%.   Wall motion was normal; there were no regional wall motion   abnormalities. There was an increased relative contribution of   atrial contraction to ventricular filling. Features are   consistent with a pseudonormal left ventricular filling pattern,   with concomitant abnormal relaxation and increased filling   pressure (grade 2 diastolic dysfunction). Doppler parameters are   consistent with high ventricular filling pressure. - Aortic valve: There was mild regurgitation. Valve area (VTI):   2.36 cm^2. Valve area (Vmax): 2.56 cm^2. Valve area (Vmean): 2.44   cm^2. - Aorta: Ascending aortic diameter: 40 mm (S). - Ascending aorta: The ascending aorta was mildly dilated.      ASSESSMENT:    1. Chronic diastolic heart failure (HCC)   2. Essential hypertension   3. Morbid obesity due to excess calories (HCC)   4. Cough variant asthma  vs uacs       PLAN:  In order of problems listed above:  Chronic diastolic CHF with recent increase in shortness of breath.  2D echo 07/2018 normal LVEF, grade 2 DD.  Coronary CTA showed no evidence of CAD, calcium score of 0, question of pulmonary hypertension.  Patient has lost 4 pounds and edema has improved on the Lasix, Spironolactone and olmesartan.  Blood pressure improved.  Renal function was checked in January and was normal.  Potassium also normal.  Continue current treatment.  Follow-up with Dr. Delton See in 3 to 4 months.  Essential hypertension blood pressure improved.  Morbid obesity patient has struggled with weight loss despite being on Belviq XR-we will refer to weight loss center.  Cough variant asthma with ongoing wheezing.  She can hardly walk without wheezing.  Recommend she follow-up with pulmonary.  Medication Adjustments/Labs and Tests Ordered: Current medicines are reviewed at length with the patient today.  Concerns regarding medicines are outlined above.  Medication changes, Labs and Tests ordered today are listed in the Patient Instructions below. Patient Instructions   Medication Instructions:  Your physician recommends that you continue on your current medications as directed. Please refer to the Current Medication list given to you today.  If you need a refill on your cardiac medications before your next appointment, please call your pharmacy.   Lab work: None Ordered  If you have labs (blood work) drawn today and your tests are completely normal, you will receive your results only by: Marland Kitchen MyChart Message (if you have MyChart) OR . A paper copy in the mail If you have any lab test that is abnormal or we need to change your treatment, we will call you to review the results.  Testing/Procedures: None ordered  Follow-Up: Your physician recommends that you schedule a follow-up appointment with Dr. Sherene Sires for your asthma  You have been [facility] with Dr. Debbra Riding   Your physician recommends that you schedule  a follow-up appointment with Dr. Delton See on 04/21/19 at 2:40 PM   Any Other Special Instructions Will Be Listed Below (If Applicable).  1. Monitor your Blood Pressure at home  2. Do not exceed more than 2,000 mg daily of sodium   Two Gram Sodium Diet 2000 mg  What is Sodium? Sodium is a mineral found naturally in many foods. The most significant source of sodium in the diet is table salt, which is about 40% sodium.  Processed, convenience, and preserved foods also contain a large amount of sodium.  The body needs only 500 mg of sodium daily to function,  A normal diet provides more than enough sodium even if you do not use salt.  Why Limit Sodium? A build up of sodium in the body can cause thirst, increased blood pressure, shortness of breath, and water retention.  Decreasing sodium in the diet can reduce edema and risk of heart attack or stroke associated with high blood pressure.  Keep in mind that there are many other factors involved in these health problems.  Heredity, obesity, lack of exercise, cigarette smoking, stress and what you eat all play a role.  General Guidelines:  Do not add salt at the table or in cooking.  One teaspoon of salt contains over 2 grams of sodium.  Read food labels  Avoid processed and convenience foods  Ask your dietitian before eating any foods not dicussed in the menu planning guidelines  Consult your physician if you wish to use a salt substitute or a sodium containing medication such as antacids.  Limit milk and milk products to 16 oz (2 cups) per day.  Shopping Hints:  READ LABELS!! "Dietetic" does not necessarily mean low sodium.  Salt and other sodium ingredients are often added to foods during processing.   Menu Planning Guidelines Food Group Choose More Often Avoid  Beverages (see also the milk group All fruit juices, low-sodium, salt-free vegetables  juices, low-sodium carbonated beverages Regular vegetable or tomato juices, commercially softened water used for drinking or cooking  Breads and Cereals Enriched white, wheat, rye and pumpernickel bread, hard rolls and dinner rolls; muffins, cornbread and waffles; most dry cereals, cooked cereal without added salt; unsalted crackers and breadsticks; low sodium or homemade bread crumbs Bread, rolls and crackers with salted tops; quick breads; instant hot cereals; pancakes; commercial bread stuffing; self-rising flower and biscuit mixes; regular bread crumbs or cracker crumbs  Desserts and Sweets Desserts and sweets mad with mild should be within allowance Instant pudding mixes and cake mixes  Fats Butter or margarine; vegetable oils; unsalted salad dressings, regular salad dressings limited to 1 Tbs; light, sour and heavy cream Regular salad dressings containing bacon fat, bacon bits, and salt pork; snack dips made with instant soup mixes or processed cheese; salted nuts  Fruits Most fresh, frozen and canned fruits Fruits processed with salt or sodium-containing ingredient (some dried fruits are processed with sodium sulfites        Vegetables Fresh, frozen vegetables and low- sodium canned vegetables Regular canned vegetables, sauerkraut, pickled vegetables, and others prepared in brine; frozen vegetables in sauces; vegetables seasoned with ham, bacon or salt pork  Condiments, Sauces, Miscellaneous  Salt substitute with physician's approval; pepper, herbs, spices; vinegar, lemon or lime juice; hot pepper sauce; garlic powder, onion powder, low sodium soy sauce (1 Tbs.); low sodium condiments (ketchup, chili sauce, mustard) in limited amounts (1 tsp.) fresh ground horseradish; unsalted tortilla chips, pretzels, potato chips, popcorn, salsa (1/4 cup) Any seasoning  made with salt including garlic salt, celery salt, onion salt, and seasoned salt; sea salt, rock salt, kosher salt; meat tenderizers;  monosodium glutamate; mustard, regular soy sauce, barbecue, sauce, chili sauce, teriyaki sauce, steak sauce, Worcestershire sauce, and most flavored vinegars; canned gravy and mixes; regular condiments; salted snack foods, olives, picles, relish, horseradish sauce, catsup   Food preparation: Try these seasonings Meats:    Pork Sage, onion Serve with applesauce  Chicken Poultry seasoning, thyme, parsley Serve with cranberry sauce  Lamb Curry powder, rosemary, garlic, thyme Serve with mint sauce or jelly  Veal Marjoram, basil Serve with current jelly, cranberry sauce  Beef Pepper, bay leaf Serve with dry mustard, unsalted chive butter  Fish [place] leaf, dill Serve with unsalted lemon butter, unsalted parsley butter  Vegetables:    Asparagus Lemon juice   Broccoli Lemon juice   Carrots Mustard dressing parsley, mint, nutmeg, glazed with unsalted butter and sugar   Green beans Marjoram, lemon juice, nutmeg,dill seed   Tomatoes Basil, marjoram, onion   Spice /blend for [employer]" 4 tsp ground thyme 1 tsp ground sage 3 tsp ground rosemary 4 tsp ground marjoram   Test your knowledge 1. A product that says "Salt Free" may still contain sodium. True or False 2. Garlic Powder and Hot Pepper Sauce an be used as alternative seasonings.True or False 3. Processed foods have more sodium than fresh foods.  True or False 4. Canned Vegetables have less sodium than froze True or False  WAYS TO DECREASE YOUR SODIUM INTAKE 1. Avoid the use of added salt in cooking and at the table.  Table salt (and other prepared seasonings which contain salt) is probably one of the greatest sources of sodium in the diet.  Unsalted foods can gain flavor from the sweet, sour, and butter taste sensations of herbs and spices.  Instead of using salt for seasoning, try the following seasonings with the foods listed.  Remember: how you use them to enhance natural food flavors is limited only by your creativity... Allspice-Meat,  fish, eggs, fruit, peas, red and yellow vegetables Almond Extract-Fruit baked goods Anise Seed-Sweet breads, fruit, carrots, beets, cottage cheese, cookies (tastes like licorice) Basil-Meat, fish, eggs, vegetables, rice, vegetables salads, soups, sauces Bay Leaf-Meat, fish, stews, poultry Burnet-Salad, vegetables (cucumber-like flavor) Caraway Seed-Bread, cookies, cottage cheese, meat, vegetables, cheese, rice Cardamon-Baked goods, fruit, soups Celery Powder or seed-Salads, salad dressings, sauces, meatloaf, soup, bread.Do not use  celery salt Chervil-Meats, salads, fish, eggs, vegetables, cottage cheese (parsley-like flavor) Chili Power-Meatloaf, chicken cheese, corn, eggplant, egg dishes Chives-Salads cottage cheese, egg dishes, soups, vegetables, sauces Cilantro-Salsa, casseroles Cinnamon-Baked goods, fruit, pork, lamb, chicken, carrots Cloves-Fruit, baked goods, fish, pot roast, green beans, beets, carrots Coriander-Pastry, cookies, meat, salads, cheese (lemon-orange flavor) Cumin-Meatloaf, fish,cheese, eggs, cabbage,fruit pie (caraway flavor) [employer], fruit, eggs, fish, poultry, cottage cheese, vegetables Dill Seed-Meat, cottage cheese, poultry, vegetables, fish, salads, bread Fennel Seed-Bread, cookies, apples, pork, eggs, fish, beets, cabbage, cheese, Licorice-like flavor Garlic-(buds or powder) Salads, meat, poultry, fish, bread, butter, vegetables, potatoes.Do not  use garlic salt Ginger-Fruit, vegetables, baked goods, meat, fish, poultry Horseradish Root-Meet, vegetables, butter Lemon Juice or Extract-Vegetables, fruit, tea, baked goods, fish salads Mace-Baked goods fruit, vegetables, fish, poultry (taste like nutmeg) Maple Extract-Syrups Marjoram-Meat, chicken, fish, vegetables, breads, green salads (taste like Sage) Mint-Tea, lamb, sherbet, vegetables, desserts, carrots, cabbage Mustard, Dry or Seed-Cheese, eggs, meats, vegetables, poultry Nutmeg-Baked goods, fruit,  chicken, eggs, vegetables, desserts Onion Powder-Meat, fish, poultry, vegetables, cheese, eggs, bread, rice salads (Do not  use   Onion salt) Orange Extract-Desserts, baked goods Oregano-Pasta, eggs, cheese, onions, pork, lamb, fish, chicken, vegetables, green salads Paprika-Meat, fish, poultry, eggs, cheese, vegetables Parsley Flakes-Butter, vegetables, meat fish, poultry, eggs, bread, salads (certain forms may   Contain sodium Pepper-Meat fish, poultry, vegetables, eggs Peppermint Extract-Desserts, baked goods Poppy Seed-Eggs, bread, cheese, fruit dressings, baked goods, noodles, vegetables, cottage  Caremark RxCheese Poultry Seasoning-Poultry,veal Rosemary-Lamb, poultry, meat, fish, cauliflower, turnips,eggs bread Saffron-Rice, bread, veal, chicken, fish, eggs Sage-Meat, fish, poultry, onions, eggplant, tomateos, pork, stews Savory-Eggs, salads, poultry, meat, rice, vegetables, soups, pork Tarragon-Meat, poultry, fish, eggs, butter, vegetables (licorice-like flavor)  Thyme-Meat, poultry, fish, eggs, vegetables, (clover-like flavor), sauces, soups Tumeric-Salads, butter, eggs, fish, rice, vegetables (saffron-like flavor) Vanilla Extract-Baked goods, candy Vinegar-Salads, vegetables, meat marinades Walnut Extract-baked goods, candy  2. Choose your Foods Wisely   The following is a list of foods to avoid which are high in sodium:  Meats-Avoid all smoked, canned, salt cured, dried and kosher meat and fish as well as Anchovies   Lox Freescale SemiconductorBacon    Luncheon meats:Bologna, Liverwurst, Pastrami Canned meat or fish  Marinated herring Caviar    Pepperoni Corned Beef   Pizza Dried chipped beef  Salami Frozen breaded fish or meat Salt pork Frankfurters or hot dogs  Sardines Gefilte fish   Sausage Ham (boiled ham, Proscuitto Smoked butt    spiced ham)   Spam      TV Dinners Vegetables Canned vegetables (Regular) Relish Canned mushrooms  Sauerkraut Olives    Tomato juice Pickles  Bakery and Dessert  Products Canned puddings  Cream pies Cheesecake   Decorated cakes Cookies  Beverages/Juices Tomato juice, regular  Gatorade   V-8 vegetable juice, regular  Breads and Cereals Biscuit mixes   Salted potato chips, corn chips, pretzels Bread stuffing mixes  Salted crackers and rolls Pancake and waffle mixes Self-rising flour  Seasonings Accent    Meat sauces Barbecue sauce  Meat tenderizer Catsup    Monosodium glutamate (MSG) Celery salt   Onion salt Chili sauce   Prepared mustard Garlic salt   Salt, seasoned salt, sea salt Gravy mixes   Soy sauce Horseradish   Steak sauce Ketchup   Tartar sauce Lite salt    Teriyaki sauce Marinade mixes   Worcestershire sauce  Others Baking powder   Cocoa and cocoa mixes Baking soda   Commercial casserole mixes Candy-caramels, chocolate  Dehydrated soups    Bars, fudge,nougats  Instant rice and pasta mixes Canned broth or soup  Maraschino cherries Cheese, aged and processed cheese and cheese spreads  Learning Assessment Quiz  Indicated T (for True) or F (for False) for each of the following statements:  1. _____ Fresh fruits and vegetables and unprocessed grains are generally low in sodium 2. _____ Water may contain a considerable amount of sodium, depending on the source 3. _____ You can always tell if a food is high in sodium by tasting it 4. _____ Certain laxatives my be high in sodium and should be avoided unless prescribed   by a physician or pharmacist 5. _____ Salt substitutes may be used freely by anyone on a sodium restricted diet 6. _____ Sodium is present in table salt, food additives and as a natural component of   most foods 7. _____ Table salt is approximately 90% sodium 8. _____ Limiting sodium intake may help prevent excess fluid accumulation in the body 9. _____ On a sodium-restricted diet, seasonings such as bouillon soy sauce, and    cooking wine should be used  in place of table salt 10. _____ On an ingredient list, a  product which lists monosodium glutamate as the first   ingredient is an appropriate food to include on a low sodium diet  Circle the best answer(s) to the following statements (Hint: there may be more than one correct answer)  11. On a low-sodium diet, some acceptable snack items are:    A. Olives  F. Bean dip   K. Grapefruit juice    B. Salted Pretzels G. Commercial Popcorn   L. Canned peaches    C. Carrot Sticks  H. Bouillon   M. Unsalted nuts   D. JamaicaFrench fries  I. Peanut butter crackers N. Salami   E. Sweet pickles J. Tomato Juice   O. Pizza  12.  Seasonings that may be used freely on a reduced - sodium diet include   A. Lemon wedges F.Monosodium glutamate K. Celery seed    B.Soysauce   G. Pepper   L. Mustard powder   C. Sea salt  H. Cooking wine  M. Onion flakes   D. Vinegar  E. Prepared horseradish N. Salsa   E. Sage   J. Worcestershire sauce  O. [address]      Elson ClanSigned, Dannilynn Gallina, PA-C  12/09/2018 8:31 AM    [facility] [address], [place], [place]  [address] Phone: [phone]; Fax: [phone]

## 2018-12-09 ENCOUNTER — Encounter: Payer: Self-pay | Admitting: Physician Assistant

## 2018-12-09 ENCOUNTER — Ambulatory Visit (INDEPENDENT_AMBULATORY_CARE_PROVIDER_SITE_OTHER): Payer: BC Managed Care – PPO | Admitting: Physician Assistant

## 2018-12-09 VITALS — BP 140/70 | HR 68 | Ht 63.0 in | Wt 256.8 lb

## 2018-12-09 DIAGNOSIS — I5032 Chronic diastolic (congestive) heart failure: Secondary | ICD-10-CM

## 2018-12-09 DIAGNOSIS — I10 Essential (primary) hypertension: Secondary | ICD-10-CM

## 2018-12-09 DIAGNOSIS — J45991 Cough variant asthma: Secondary | ICD-10-CM | POA: Diagnosis not present

## 2018-12-09 NOTE — Patient Instructions (Addendum)
Medication Instructions:  Your physician recommends that you continue on your current medications as directed. Please refer to the Current Medication list given to you today.  If you need a refill on your cardiac medications before your next appointment, please call your pharmacy.   Lab work: None Ordered  If you have labs (blood work) drawn today and your tests are completely normal, you will receive your results only by: Marland Kitchen. MyChart Message (if you have MyChart) OR . A paper copy in the mail If you have any lab test that is abnormal or we need to change your treatment, we will call you to review the results.  Testing/Procedures: None ordered  Follow-Up: Your physician recommends that you schedule a follow-up appointment with Dr. Sherene SiresWert for your asthma  [facility] with Dr. Debbra RidingAlexandria Kadolph   Your physician recommends that you schedule a follow-up appointment with Dr. Delton SeeNelson on 04/21/19 at 2:40 PM   Any Other Special Instructions Will Be Listed Below (If Applicable).  1. Monitor your Blood Pressure at home  2. Do not exceed more than 2,000 mg daily of sodium   Two Gram Sodium Diet 2000 mg  What is Sodium? Sodium is a mineral found naturally in many foods. The most significant source of sodium in the diet is table salt, which is about 40% sodium.  Processed, convenience, and preserved foods also contain a large amount of sodium.  The body needs only 500 mg of sodium daily to function,  A normal diet provides more than enough sodium even if you do not use salt.  Why Limit Sodium? A build up of sodium in the body can cause thirst, increased blood pressure, shortness of breath, and water retention.  Decreasing sodium in the diet can reduce edema and risk of heart attack or stroke associated with high blood pressure.  Keep in mind that there are many other factors involved in these health problems.  Heredity, obesity, lack of exercise, cigarette  smoking, stress and what you eat all play a role.  General Guidelines:  Do not add salt at the table or in cooking.  One teaspoon of salt contains over 2 grams of sodium.  Read food labels  Avoid processed and convenience foods  Ask your dietitian before eating any foods not dicussed in the menu planning guidelines  Consult your physician if you wish to use a salt substitute or a sodium containing medication such as antacids.  Limit milk and milk products to 16 oz (2 cups) per day.  Shopping Hints:  READ LABELS!! "Dietetic" does not necessarily mean low sodium.  Salt and other sodium ingredients are often added to foods during processing.   Menu Planning Guidelines Food Group Choose More Often Avoid  Beverages (see also the milk group All fruit juices, low-sodium, salt-free vegetables juices, low-sodium carbonated beverages Regular vegetable or tomato juices, commercially softened water used for drinking or cooking  Breads and Cereals Enriched white, wheat, rye and pumpernickel bread, hard rolls and dinner rolls; muffins, cornbread and waffles; most dry cereals, cooked cereal without added salt; unsalted crackers and breadsticks; low sodium or homemade bread crumbs Bread, rolls and crackers with salted tops; quick breads; instant hot cereals; pancakes; commercial bread stuffing; self-rising flower and biscuit mixes; regular bread crumbs or cracker crumbs  Desserts and Sweets Desserts and sweets mad with mild should be within allowance Instant pudding mixes and cake mixes  Fats Butter or margarine; vegetable oils; unsalted salad dressings, regular  salad dressings limited to 1 Tbs; light, sour and heavy cream Regular salad dressings containing bacon fat, bacon bits, and salt pork; snack dips made with instant soup mixes or processed cheese; salted nuts  Fruits Most fresh, frozen and canned fruits Fruits processed with salt or sodium-containing ingredient (some dried fruits are processed with  sodium sulfites        Vegetables Fresh, frozen vegetables and low- sodium canned vegetables Regular canned vegetables, sauerkraut, pickled vegetables, and others prepared in brine; frozen vegetables in sauces; vegetables seasoned with ham, bacon or salt pork  Condiments, Sauces, Miscellaneous  Salt substitute with physician's approval; pepper, herbs, spices; vinegar, lemon or lime juice; hot pepper sauce; garlic powder, onion powder, low sodium soy sauce (1 Tbs.); low sodium condiments (ketchup, chili sauce, mustard) in limited amounts (1 tsp.) fresh ground horseradish; unsalted tortilla chips, pretzels, potato chips, popcorn, salsa (1/4 cup) Any seasoning made with salt including garlic salt, celery salt, onion salt, and seasoned salt; sea salt, rock salt, kosher salt; meat tenderizers; monosodium glutamate; mustard, regular soy sauce, barbecue, sauce, chili sauce, teriyaki sauce, steak sauce, Worcestershire sauce, and most flavored vinegars; canned gravy and mixes; regular condiments; salted snack foods, olives, picles, relish, horseradish sauce, catsup   Food preparation: Try these seasonings Meats:    Pork Sage, onion Serve with applesauce  Chicken Poultry seasoning, thyme, parsley Serve with cranberry sauce  Lamb Curry powder, rosemary, garlic, thyme Serve with mint sauce or jelly  Veal Marjoram, basil Serve with current jelly, cranberry sauce  Beef Pepper, bay leaf Serve with dry mustard, unsalted chive butter  Fish [place] leaf, dill Serve with unsalted lemon butter, unsalted parsley butter  Vegetables:    Asparagus Lemon juice   Broccoli Lemon juice   Carrots Mustard dressing parsley, mint, nutmeg, glazed with unsalted butter and sugar   Green beans Marjoram, lemon juice, nutmeg,dill seed   Tomatoes Basil, marjoram, onion   Spice /blend for [employer]" 4 tsp ground thyme 1 tsp ground sage 3 tsp ground rosemary 4 tsp ground marjoram   Test your knowledge 1. A product that says  "Salt Free" may still contain sodium. True or False 2. Garlic Powder and Hot Pepper Sauce an be used as alternative seasonings.True or False 3. Processed foods have more sodium than fresh foods.  True or False 4. Canned Vegetables have less sodium than froze True or False  WAYS TO DECREASE YOUR SODIUM INTAKE 1. Avoid the use of added salt in cooking and at the table.  Table salt (and other prepared seasonings which contain salt) is probably one of the greatest sources of sodium in the diet.  Unsalted foods can gain flavor from the sweet, sour, and butter taste sensations of herbs and spices.  Instead of using salt for seasoning, try the following seasonings with the foods listed.  Remember: how you use them to enhance natural food flavors is limited only by your creativity... Allspice-Meat, fish, eggs, fruit, peas, red and yellow vegetables Almond Extract-Fruit baked goods Anise Seed-Sweet breads, fruit, carrots, beets, cottage cheese, cookies (tastes like licorice) Basil-Meat, fish, eggs, vegetables, rice, vegetables salads, soups, sauces Bay Leaf-Meat, fish, stews, poultry Burnet-Salad, vegetables (cucumber-like flavor) Caraway Seed-Bread, cookies, cottage cheese, meat, vegetables, cheese, rice Cardamon-Baked goods, fruit, soups Celery Powder or seed-Salads, salad dressings, sauces, meatloaf, soup, bread.Do not use  celery salt Chervil-Meats, salads, fish, eggs, vegetables, cottage cheese (parsley-like flavor) Chili Power-Meatloaf, chicken cheese, corn, eggplant, egg dishes Chives-Salads cottage cheese, egg dishes, soups, vegetables, sauces Cilantro-Salsa, casseroles  Cinnamon-Baked goods, fruit, pork, lamb, chicken, carrots Cloves-Fruit, baked goods, fish, pot roast, green beans, beets, carrots Coriander-Pastry, cookies, meat, salads, cheese (lemon-orange flavor) Cumin-Meatloaf, fish,cheese, eggs, cabbage,fruit pie (caraway flavor) [employer], fruit, eggs, fish, poultry, cottage  cheese, vegetables Dill Seed-Meat, cottage cheese, poultry, vegetables, fish, salads, bread Fennel Seed-Bread, cookies, apples, pork, eggs, fish, beets, cabbage, cheese, Licorice-like flavor Garlic-(buds or powder) Salads, meat, poultry, fish, bread, butter, vegetables, potatoes.Do not  use garlic salt Ginger-Fruit, vegetables, baked goods, meat, fish, poultry Horseradish Root-Meet, vegetables, butter Lemon Juice or Extract-Vegetables, fruit, tea, baked goods, fish salads Mace-Baked goods fruit, vegetables, fish, poultry (taste like nutmeg) Maple Extract-Syrups Marjoram-Meat, chicken, fish, vegetables, breads, green salads (taste like Sage) Mint-Tea, lamb, sherbet, vegetables, desserts, carrots, cabbage Mustard, Dry or Seed-Cheese, eggs, meats, vegetables, poultry Nutmeg-Baked goods, fruit, chicken, eggs, vegetables, desserts Onion Powder-Meat, fish, poultry, vegetables, cheese, eggs, bread, rice salads (Do not use   Onion salt) Orange Extract-Desserts, baked goods Oregano-Pasta, eggs, cheese, onions, pork, lamb, fish, chicken, vegetables, green salads Paprika-Meat, fish, poultry, eggs, cheese, vegetables Parsley Flakes-Butter, vegetables, meat fish, poultry, eggs, bread, salads (certain forms may   Contain sodium Pepper-Meat fish, poultry, vegetables, eggs Peppermint Extract-Desserts, baked goods Poppy Seed-Eggs, bread, cheese, fruit dressings, baked goods, noodles, vegetables, cottage  [employer], poultry, meat, fish, cauliflower, turnips,eggs bread Saffron-Rice, bread, veal, chicken, fish, eggs Sage-Meat, fish, poultry, onions, eggplant, tomateos, pork, stews Savory-Eggs, salads, poultry, meat, rice, vegetables, soups, pork Tarragon-Meat, poultry, fish, eggs, butter, vegetables (licorice-like flavor)  Thyme-Meat, poultry, fish, eggs, vegetables, (clover-like flavor), sauces, soups Tumeric-Salads, butter, eggs, fish, rice, vegetables  (saffron-like flavor) Vanilla Extract-Baked goods, candy Vinegar-Salads, vegetables, meat marinades Walnut Extract-baked goods, candy  2. Choose your Foods Wisely   The following is a list of foods to avoid which are high in sodium:  Meats-Avoid all smoked, canned, salt cured, dried and kosher meat and fish as well as Anchovies   [employer] meats:Bologna, Liverwurst, Pastrami Canned meat or fish  Marinated herring Caviar    Pepperoni Corned Beef   Pizza Dried chipped beef  Salami Frozen breaded fish or meat Salt pork Frankfurters or hot dogs  Sardines Gefilte fish   Sausage Ham (boiled ham, Proscuitto Smoked butt    spiced ham)   Spam      TV Dinners Vegetables Canned vegetables (Regular) Relish Canned mushrooms  Sauerkraut Olives    Tomato juice Pickles  Bakery and Dessert Products Canned puddings  Cream pies Cheesecake   Decorated cakes Cookies  Beverages/Juices Tomato juice, regular  Gatorade   V-8 vegetable juice, regular  Breads and Cereals Biscuit mixes   Salted potato chips, corn chips, pretzels Bread stuffing mixes  Salted crackers and rolls Pancake and waffle mixes Self-rising flour  Seasonings Accent    Meat sauces Barbecue sauce  Meat tenderizer Catsup    Monosodium glutamate (MSG) Celery salt   Onion salt Chili sauce   Prepared mustard Garlic salt   Salt, seasoned salt, sea salt Gravy mixes   Soy sauce Horseradish   Steak sauce Ketchup   Tartar sauce Lite salt    Teriyaki sauce Marinade mixes   Worcestershire sauce  Others Baking powder   Cocoa and cocoa mixes Baking soda   Commercial casserole mixes Candy-caramels, chocolate  Dehydrated soups    Bars, fudge,nougats  Instant rice and pasta mixes Canned broth or soup  Maraschino cherries Cheese, aged and processed cheese and cheese spreads  Learning Assessment Quiz  Indicated T (for True) or  F (for False) for each of the following statements:  1. _____ Fresh fruits and vegetables  and unprocessed grains are generally low in sodium 2. _____ Water may contain a considerable amount of sodium, depending on the source 3. _____ You can always tell if a food is high in sodium by tasting it 4. _____ Certain laxatives my be high in sodium and should be avoided unless prescribed   by a physician or pharmacist 5. _____ Salt substitutes may be used freely by anyone on a sodium restricted diet 6. _____ Sodium is present in table salt, food additives and as a natural component of   most foods 7. _____ Table salt is approximately 90% sodium 8. _____ Limiting sodium intake may help prevent excess fluid accumulation in the body 9. _____ On a sodium-restricted diet, seasonings such as bouillon soy sauce, and    cooking wine should be used in place of table salt 10. _____ On an ingredient list, a product which lists monosodium glutamate as the first   ingredient is an appropriate food to include on a low sodium diet  Circle the best answer(s) to the following statements (Hint: there may be more than one correct answer)  11. On a low-sodium diet, some acceptable snack items are:    A. Olives  F. Bean dip   K. Grapefruit juice    B. Salted Pretzels G. Commercial Popcorn   L. Canned peaches    C. Carrot Sticks  H. Bouillon   M. Unsalted nuts   D. [place] fries  I. Peanut butter crackers N. Salami   E. Sweet pickles J. Tomato Juice   O. Pizza  12.  Seasonings that may be used freely on a reduced - sodium diet include   A. Lemon wedges F.Monosodium glutamate K. Celery seed    B.Soysauce   G. Pepper   L. Mustard powder   C. Sea salt  H. Cooking wine  M. Onion flakes   D. Vinegar  E. Prepared horseradish N. Salsa   E. Sage   J. Worcestershire sauce  O. Chutney

## 2019-01-28 ENCOUNTER — Other Ambulatory Visit: Payer: Self-pay | Admitting: Internal Medicine

## 2019-01-28 NOTE — Telephone Encounter (Signed)
Refill x one month and set up televisit before this runs out if wants [place] to continue to refill as we haven't seen her in 6 months and was supposed to return in 2weeks per instructions on avs

## 2019-01-28 NOTE — Telephone Encounter (Signed)
Dr. Sherene Sires, please advise if you are also okay with [place] refilling tessalon perles for pt? Thanks!

## 2019-01-28 NOTE — Telephone Encounter (Signed)
MW please advise if Gabapentin is ok to refill.

## 2019-02-15 ENCOUNTER — Other Ambulatory Visit: Payer: Self-pay

## 2019-02-15 ENCOUNTER — Encounter: Payer: Self-pay | Admitting: Internal Medicine

## 2019-02-15 ENCOUNTER — Ambulatory Visit: Payer: BC Managed Care – PPO | Admitting: Internal Medicine

## 2019-02-15 VITALS — BP 162/100 | HR 66 | Temp 98.3°F | Ht 63.0 in | Wt 250.0 lb

## 2019-02-15 DIAGNOSIS — I5032 Chronic diastolic (congestive) heart failure: Secondary | ICD-10-CM

## 2019-02-15 DIAGNOSIS — I110 Hypertensive heart disease with heart failure: Secondary | ICD-10-CM | POA: Diagnosis not present

## 2019-02-15 DIAGNOSIS — J302 Other seasonal allergic rhinitis: Secondary | ICD-10-CM | POA: Diagnosis not present

## 2019-02-15 DIAGNOSIS — R0789 Other chest pain: Secondary | ICD-10-CM

## 2019-02-15 DIAGNOSIS — J45991 Cough variant asthma: Secondary | ICD-10-CM

## 2019-02-15 MED ORDER — HYDROCODONE-HOMATROPINE 5-1.5 MG/5ML PO SYRP: 5.0000 mL | ORAL_SOLUTION | Freq: Four times a day (QID) | ORAL | 0 refills | Status: DC | PRN

## 2019-02-15 MED ORDER — TRAMADOL HCL 50 MG PO TABS: 50.0000 mg | ORAL_TABLET | Freq: Four times a day (QID) | ORAL | 0 refills | Status: DC | PRN

## 2019-02-15 MED ORDER — TRIAMCINOLONE ACETONIDE 40 MG/ML IJ SUSP
60.0000 mg | Freq: Once | INTRAMUSCULAR | Status: AC
  Administered 2019-02-15: 60 mg via INTRAMUSCULAR

## 2019-02-15 NOTE — Patient Instructions (Signed)

## 2019-02-15 NOTE — Progress Notes (Signed)
Subjective:     Patient ID: Jessica Snow , female    DOB: [birth year] , 53 y.o.   MRN: [id number]   Chief Complaint  Patient presents with  . Cough    HPI  Cough  This is a recurrent problem. The current episode started in the past 7 days. The problem has been unchanged. The problem occurs every few minutes. The cough is non-productive. Associated symptoms include postnasal drip and rhinorrhea. The treatment provided no relief. Her past medical history is significant for asthma and environmental allergies.     Past Medical History:  Diagnosis Date  . Asthma   . HTN (hypertension)   . Pneumonia      Family History  Problem Relation Age of Onset  . Hypertension Mother   . Diabetes Mother   . Hypertension Father   . Anemia Father      Current Outpatient Medications:  .  albuterol (PROAIR HFA) 108 (90 Base) MCG/ACT inhaler, Inhale 2 puffs into the lungs every 6 (six) hours as needed for wheezing or shortness of breath., Disp: , Rfl:  .  albuterol (PROVENTIL) (2.5 MG/3ML) 0.083% nebulizer solution, 1 vial in neb every 4 hours as needed, Disp: , Rfl:  .  budesonide-formoterol (SYMBICORT) 80-4.5 MCG/ACT inhaler, Inhale 2 puffs into the lungs 2 (two) times daily., Disp: 1 Inhaler, Rfl: 11 .  famotidine (PEPCID) 20 MG tablet, One at bedtime, Disp: 30 tablet, Rfl: 11 .  furosemide (LASIX) 40 MG tablet, Take 1 tablet (40 mg total) by mouth daily., Disp: 90 tablet, Rfl: 3 .  gabapentin (NEURONTIN) 100 MG capsule, TAKE 1 CAPSULE (100 MG TOTAL) BY MOUTH 3 (THREE) TIMES DAILY., Disp: 90 capsule, Rfl: 0 .  montelukast (SINGULAIR) 10 MG tablet, Take 1 tablet (10 mg total) by mouth at bedtime., Disp: 30 tablet, Rfl: 11 .  olmesartan (BENICAR) 20 MG tablet, Take 1 tablet (20 mg total) by mouth daily., Disp: 90 tablet, Rfl: 3 .  pantoprazole (PROTONIX) 40 MG tablet, TAKE 30- 60 MIN BEFORE YOUR FIRST AND LAST MEALS OF THE DAY, Disp: 180 tablet, Rfl: 0 .  spironolactone (ALDACTONE) 25 MG  tablet, Take 0.5 tablets (12.5 mg total) by mouth daily., Disp: 45 tablet, Rfl: 3 .  HYDROcodone-homatropine (HYDROMET) 5-1.5 MG/5ML syrup, Take 5 mLs by mouth every 6 (six) hours as needed for cough., Disp: 120 mL, Rfl: 0 .  lidocaine (LIDODERM) 5 %, Place 1 patch onto the skin every 12 (twelve) hours. Remove & Discard patch within 12 hours or as directed by MD (Patient not taking: Reported on 02/15/2019), Disp: 60 patch, Rfl: 1 .  traMADol (ULTRAM) 50 MG tablet, Take 1 tablet (50 mg total) by mouth every 6 (six) hours as needed for moderate pain., Disp: 30 tablet, Rfl: 0   No Known Allergies   Review of Systems  Constitutional: Negative.   HENT: Positive for postnasal drip and rhinorrhea.   Respiratory: Positive for cough.   Cardiovascular: Negative.   Gastrointestinal: Negative.   Allergic/Immunologic: Positive for environmental allergies.  Neurological: Negative.   Psychiatric/Behavioral: Negative.      Today's Vitals   02/15/19 0902  BP: (!) 162/100  Pulse: 66  Temp: 98.3 F (36.8 C)  TempSrc: Oral  SpO2: 98%  Weight: 250 lb (113.4 kg)  Height: 5\' 3"  (1.6 m)  PainSc: 8   PainLoc: Generalized   Body mass index is 44.29 kg/m.   Objective:  Physical Exam Vitals signs and nursing note reviewed.  Constitutional:  Appearance: Normal appearance.  HENT:     Head: Normocephalic and atraumatic.  Cardiovascular:     Rate and Rhythm: Normal rate and regular rhythm.     Heart sounds: Normal heart sounds.  Pulmonary:     Effort: Pulmonary effort is normal.     Breath sounds: Examination of the right-middle field reveals wheezing. Wheezing present.  Chest:     Comments: Left chest wall tenderness to palpation. No overlying erythema. No rash noted.  Skin:    General: Skin is warm.  Neurological:     General: No focal deficit present.     Mental Status: She is alert.  Psychiatric:        Mood and Affect: Mood normal.        Behavior: Behavior normal.          Assessment And Plan:     1. Cough variant asthma  She is currently having a flare. She was given kenalog injection. She was also given rx hydromet syrup to use prn. She was also given work note for today. She agrees to rtw tomorrow if she is feeling better. Pt is aware that she is at moderate risk for developing COVID-19 complications. She is encouraged to practice social distancing.   - triamcinolone acetonide (KENALOG-40) injection 60 mg  2. Left-sided chest wall pain  I think this is muscle strain that resulted from her incessant coughing. She is encouraged to apply OTC lidocaine patches to affected area as needed. She was also given rx tramadol to use prn. This was written 3 times b/c first two times the rx was printed instead of being sent directly to the pharmacy. PDMP was reviewed as well.   3. Seasonal allergies  Chronic. She is encouraged to continue with current meds.   4. Hypertensive heart disease with chronic diastolic congestive heart failure (HCC)  Uncontrolled. I will increase her olmesartan 20mg  to 2 tabs 20mg  daily. She will rto in four weeks for re-evaluation. She is encouraged to avoid adding salt to her foods and to avoid packaged foods.   - Pro b natriuretic peptide (BNP)        Gwynneth Alimentobyn N Chaney Ingram, MD    THE PATIENT IS ENCOURAGED TO PRACTICE SOCIAL DISTANCING DUE TO THE COVID-19 PANDEMIC.

## 2019-02-16 ENCOUNTER — Telehealth: Payer: Self-pay

## 2019-02-16 NOTE — Telephone Encounter (Signed)
Patient called asking when should she come back for her bloodwork. Patient stated Sheila Oats told her to come back at her earliest convinence. I have scheduled pt to come in tomorrow morning on 04/22. YRL,RMA

## 2019-02-17 ENCOUNTER — Other Ambulatory Visit: Payer: Self-pay

## 2019-02-17 ENCOUNTER — Other Ambulatory Visit: Payer: Self-pay | Admitting: Internal Medicine

## 2019-02-17 ENCOUNTER — Other Ambulatory Visit: Payer: BC Managed Care – PPO

## 2019-02-18 LAB — PRO B NATRIURETIC PEPTIDE: NT-Pro BNP: 58 pg/mL (ref 0–249)

## 2019-02-19 ENCOUNTER — Telehealth: Payer: Self-pay

## 2019-02-19 NOTE — Telephone Encounter (Signed)
Left the patient a message to call back for lab results. 

## 2019-03-04 ENCOUNTER — Other Ambulatory Visit: Payer: Self-pay

## 2019-03-04 ENCOUNTER — Other Ambulatory Visit: Payer: Self-pay | Admitting: Internal Medicine

## 2019-03-04 MED ORDER — PANTOPRAZOLE SODIUM 40 MG PO TBEC: DELAYED_RELEASE_TABLET | ORAL | 0 refills | Status: DC

## 2019-03-17 ENCOUNTER — Ambulatory Visit: Payer: BC Managed Care – PPO | Admitting: Internal Medicine

## 2019-03-17 ENCOUNTER — Encounter: Payer: Self-pay | Admitting: Internal Medicine

## 2019-03-17 ENCOUNTER — Other Ambulatory Visit: Payer: Self-pay

## 2019-03-17 VITALS — BP 126/80 | HR 77 | Temp 98.4°F | Ht 63.0 in | Wt 264.0 lb

## 2019-03-17 DIAGNOSIS — M545 Low back pain: Secondary | ICD-10-CM | POA: Diagnosis not present

## 2019-03-17 DIAGNOSIS — I110 Hypertensive heart disease with heart failure: Secondary | ICD-10-CM

## 2019-03-17 DIAGNOSIS — R7309 Other abnormal glucose: Secondary | ICD-10-CM

## 2019-03-17 DIAGNOSIS — J45991 Cough variant asthma: Secondary | ICD-10-CM | POA: Diagnosis not present

## 2019-03-17 DIAGNOSIS — I5032 Chronic diastolic (congestive) heart failure: Secondary | ICD-10-CM

## 2019-03-17 DIAGNOSIS — R635 Abnormal weight gain: Secondary | ICD-10-CM

## 2019-03-17 DIAGNOSIS — Z79899 Other long term (current) drug therapy: Secondary | ICD-10-CM

## 2019-03-17 DIAGNOSIS — G8929 Other chronic pain: Secondary | ICD-10-CM

## 2019-03-17 MED ORDER — OLMESARTAN MEDOXOMIL 40 MG PO TABS: 40.0000 mg | ORAL_TABLET | Freq: Every day | ORAL | 1 refills | Status: DC

## 2019-03-17 NOTE — Patient Instructions (Signed)

## 2019-03-18 ENCOUNTER — Encounter: Payer: Self-pay | Admitting: Internal Medicine

## 2019-03-18 ENCOUNTER — Telehealth: Payer: Self-pay

## 2019-03-18 LAB — BMP8+EGFR
BUN/Creatinine Ratio: 19 (ref 9–23)
BUN: 12 mg/dL (ref 6–24)
CO2: 21 mmol/L (ref 20–29)
Calcium: 8.8 mg/dL (ref 8.7–10.2)
Chloride: 102 mmol/L (ref 96–106)
Creatinine, Ser: 0.64 mg/dL (ref 0.57–1.00)
GFR calc Af Amer: 119 mL/min/{1.73_m2} (ref >59)
GFR calc non Af Amer: 104 mL/min/{1.73_m2} (ref >59)
Glucose: 123 mg/dL — ABNORMAL HIGH (ref 65–99)
Potassium: 4 mmol/L (ref 3.5–5.2)
Sodium: 139 mmol/L (ref 134–144)

## 2019-03-18 LAB — HEMOGLOBIN A1C
Est. average glucose Bld gHb Est-mCnc: 140 mg/dL
Hgb A1c MFr Bld: 6.5 % — ABNORMAL HIGH (ref 4.8–5.6)

## 2019-03-18 LAB — VITAMIN B12: Vitamin B-12: 659 pg/mL (ref 232–1245)

## 2019-03-18 LAB — TSH: TSH: 1.09 u[IU]/mL (ref 0.450–4.500)

## 2019-03-18 NOTE — Telephone Encounter (Signed)
Called to inform pt that we didn't have samples of ozempic for her to come get a sample of saxenda until we had ozempic for her. Provider advised her to stay on 0.6 until ozempic available

## 2019-03-20 NOTE — Progress Notes (Signed)
Subjective:     Patient ID: Jessica Snow , female    DOB: 1966-09-16 , 53 y.o.   MRN: [id number]   Chief Complaint  Patient presents with  . Hypertension    HPI  Hypertension  This is a chronic problem. The current episode started more than 1 year ago. The problem has been gradually improving since onset. The problem is uncontrolled. Pertinent negatives include no blurred vision, chest pain, palpitations or shortness of breath. Past treatments include angiotensin blockers and diuretics. The current treatment provides mild improvement. Compliance problems include exercise.      Past Medical History:  Diagnosis Date  . Asthma   . HTN (hypertension)   . Pneumonia      Family History  Problem Relation Age of Onset  . Hypertension Mother   . Diabetes Mother   . Hypertension Father   . Anemia Father      Current Outpatient Medications:  .  albuterol (PROVENTIL) (2.5 MG/3ML) 0.083% nebulizer solution, 1 vial in neb every 4 hours as needed, Disp: , Rfl:  .  albuterol (VENTOLIN HFA) 108 (90 Base) MCG/ACT inhaler, TAKE 2 PUFFS BY MOUTH EVERY 4 TO 6 HOURS AS NEEDED, Disp: 8.5 Inhaler, Rfl: 11 .  budesonide-formoterol (SYMBICORT) 80-4.5 MCG/ACT inhaler, Inhale 2 puffs into the lungs 2 (two) times daily., Disp: 1 Inhaler, Rfl: 11 .  famotidine (PEPCID) 20 MG tablet, One at bedtime, Disp: 30 tablet, Rfl: 11 .  furosemide (LASIX) 40 MG tablet, Take 1 tablet (40 mg total) by mouth daily., Disp: 90 tablet, Rfl: 3 .  gabapentin (NEURONTIN) 100 MG capsule, TAKE 1 CAPSULE (100 MG TOTAL) BY MOUTH 3 (THREE) TIMES DAILY., Disp: 90 capsule, Rfl: 0 .  montelukast (SINGULAIR) 10 MG tablet, Take 1 tablet (10 mg total) by mouth at bedtime., Disp: 30 tablet, Rfl: 11 .  pantoprazole (PROTONIX) 40 MG tablet, Take 1 tablet by mouth daily, Disp: 180 tablet, Rfl: 0 .  spironolactone (ALDACTONE) 25 MG tablet, Take 0.5 tablets (12.5 mg total) by mouth daily., Disp: 45 tablet, Rfl: 3 .  traMADol (ULTRAM) 50  MG tablet, Take 1 tablet (50 mg total) by mouth every 6 (six) hours as needed for moderate pain., Disp: 30 tablet, Rfl: 0 .  HYDROcodone-homatropine (HYDROMET) 5-1.5 MG/5ML syrup, Take 5 mLs by mouth every 6 (six) hours as needed for cough. (Patient not taking: Reported on 03/17/2019), Disp: 120 mL, Rfl: 0 .  lidocaine (LIDODERM) 5 %, Place 1 patch onto the skin every 12 (twelve) hours. Remove & Discard patch within 12 hours or as directed by MD (Patient not taking: Reported on 02/15/2019), Disp: 60 patch, Rfl: 1 .  olmesartan (BENICAR) 40 MG tablet, Take 1 tablet (40 mg total) by mouth daily., Disp: 90 tablet, Rfl: 1   No Known Allergies   Review of Systems  Constitutional: Negative.   Eyes: Negative for blurred vision.  Respiratory: Negative.  Negative for shortness of breath.   Cardiovascular: Negative.  Negative for chest pain and palpitations.  Gastrointestinal: Negative.   Musculoskeletal: Positive for back pain.       She c/o back pain. Reports this interferes with her ability to exercise.   Neurological: Negative.   Psychiatric/Behavioral: Negative.      Today's Vitals   03/17/19 1157  BP: 126/80  Pulse: 77  Temp: 98.4 F (36.9 C)  TempSrc: Oral  Weight: 264 lb (119.7 kg)  Height: '5\' 3"'$  (1.6 m)   Body mass index is 46.77 kg/m.   Objective:  Physical Exam Vitals signs and nursing note reviewed.  Constitutional:      Appearance: Normal appearance.  HENT:     Head: Normocephalic and atraumatic.  Cardiovascular:     Rate and Rhythm: Normal rate and regular rhythm.     Heart sounds: Normal heart sounds.  Pulmonary:     Effort: Pulmonary effort is normal.     Breath sounds: Normal breath sounds.  Skin:    General: Skin is warm.  Neurological:     General: No focal deficit present.     Mental Status: She is alert.  Psychiatric:        Mood and Affect: Mood normal.        Behavior: Behavior normal.         Assessment And Plan:     1. Hypertensive heart disease  with chronic diastolic congestive heart failure (HCC)  Uncontrolled. I will increase her olmesartan to '40mg'$  daily. Again, importance of regular exercise was discussed with the patient. She will rto in four to six weeks for re-evaluation.   2. Cough variant asthma  Chronic, appears to be stable.   3. Chronic bilateral low back pain without sciatica  She agrees to go to physical therapy. She is encouraged to wear a mask to all appointments. She is also encouraged to perform stretching exercises. Her sx are likely exacerbated by excess abdominal adiposity.   - Ambulatory referral to Physical Therapy  4. Weight gain  Again, importance of dietary and exercise compliance was discussed with the patient.   - TSH  5. Other abnormal glucose  HER A1C HAS BEEN ELEVATED IN THE PAST. I WILL CHECK AN A1C, BMET TODAY. SHE WAS ENCOURAGED TO AVOID SUGARY BEVERAGES AND PROCESSED FOODS INCLUDNG BREADS, RICE AND PASTA.  - Hemoglobin A1c  6. Drug therapy  - Vitamin B12 - BMP8+EGFR        [name], MD    THE PATIENT IS ENCOURAGED TO PRACTICE SOCIAL DISTANCING DUE TO THE COVID-19 PANDEMIC.

## 2019-04-10 ENCOUNTER — Other Ambulatory Visit: Payer: Self-pay | Admitting: Internal Medicine

## 2019-04-12 ENCOUNTER — Telehealth: Payer: Self-pay | Admitting: *Deleted

## 2019-04-12 NOTE — Telephone Encounter (Signed)
LMOM for patient to call the office to discuss video vs telephone for her upcoming appointment and to obtain consent.

## 2019-04-20 ENCOUNTER — Telehealth: Payer: Self-pay | Admitting: *Deleted

## 2019-04-20 NOTE — Telephone Encounter (Signed)
Attempted to call the pt to do her COVID PRE-SCREENING QUESTIONS for her upcoming OV with Dr Meda Coffee scheduled for tomorrow 6/24 at 1:40 pm, and pts only contact number available in her chart is NOT a working number/invalid, and there are no other means of contacting this pt.

## 2019-04-21 ENCOUNTER — Other Ambulatory Visit: Payer: Self-pay

## 2019-04-21 ENCOUNTER — Ambulatory Visit (INDEPENDENT_AMBULATORY_CARE_PROVIDER_SITE_OTHER): Payer: BC Managed Care – PPO | Admitting: Cardiology

## 2019-04-21 ENCOUNTER — Encounter: Payer: Self-pay | Admitting: Cardiology

## 2019-04-21 VITALS — BP 136/78 | HR 86 | Ht 63.0 in | Wt 262.8 lb

## 2019-04-21 DIAGNOSIS — I5032 Chronic diastolic (congestive) heart failure: Secondary | ICD-10-CM

## 2019-04-21 DIAGNOSIS — I10 Essential (primary) hypertension: Secondary | ICD-10-CM

## 2019-04-21 NOTE — Progress Notes (Signed)
Cardiology Office Note    Date:  04/21/2019   ID:  Jessica Snow, DOB 02-14-1966, MRN [id number]  PCP:  Dorothyann PengSanders, Robyn, MD  Cardiologist: Tobias AlexanderKatarina Lillah Standre, MD EPS: None  Chief complaint: Kidney  History of Present Illness:  Jessica Snow is a 53 y.o. female history of hypertension, asthma, worsening dyspnea on exertion.  Patient saw Dr. Delton SeeNelson 10/09/2018 who thought she had acute bronchitis and gave her a steroid taper and started her on Lasix, Spironolactone, and olmesartan and stopped her HCTZ.  She also ordered a coronary CTA.  Calcium score was 0 previous, poor quality study secondary to patient size and motion however no evidence of CAD, dilated pulmonary artery measuring 35 mm suggestive of pulmonary hypertension.  Echo 07/2018 normal LVEF 60 to 65% with grade 2 DD and mildly dilated a sending aorta 40 mm.  Patient went to the [facility] ER 11/15/2018 for prolonged coughing.  Chest x-ray that day showed some vascular congestion and question of infiltrates.  Renal function and potassium were normal that day.  She still struggles a lot with asthma and wheezing.  Overall her swelling has improved and blood pressure is better.  She has some days that her legs swell but is usually when she is on her feet all day at work.  She has struggled to lose weight and is on Belviq XR to help with this but she does not think it is working.  04/21/2019 -the patient is coming for follow-up, her lower extremity edema is slightly improved but she is experiencing some upper extremity edema as well as cramping, she continues to have wheezing despite using Singulair and Symbicort.  She denies any chest pain.  She has been compliant with her medications.  Past Medical History:  Diagnosis Date  . Asthma   . HTN (hypertension)   . Pneumonia     Past Surgical History:  Procedure Laterality Date  . TOTAL VAGINAL HYSTERECTOMY  2007    Current Medications: Current Meds  Medication Sig  . albuterol  (PROVENTIL) (2.5 MG/3ML) 0.083% nebulizer solution 1 vial in neb every 4 hours as needed  . albuterol (VENTOLIN HFA) 108 (90 Base) MCG/ACT inhaler TAKE 2 PUFFS BY MOUTH EVERY 4 TO 6 HOURS AS NEEDED  . benzonatate (TESSALON) 200 MG capsule TAKE 1 CAPSULE BY MOUTH EVERY DAY 3 TIMES A DAY AS NEEDED FOR COUGH  . budesonide-formoterol (SYMBICORT) 80-4.5 MCG/ACT inhaler Inhale 2 puffs into the lungs 2 (two) times daily.  . famotidine (PEPCID) 20 MG tablet One at bedtime  . furosemide (LASIX) 40 MG tablet Take 1 tablet (40 mg total) by mouth daily.  Marland Kitchen. gabapentin (NEURONTIN) 100 MG capsule TAKE 1 CAPSULE (100 MG TOTAL) BY MOUTH 3 (THREE) TIMES DAILY.  . montelukast (SINGULAIR) 10 MG tablet Take 1 tablet (10 mg total) by mouth at bedtime.  Marland Kitchen. olmesartan (BENICAR) 40 MG tablet Take 1 tablet (40 mg total) by mouth daily.  . pantoprazole (PROTONIX) 40 MG tablet Take 1 tablet by mouth daily  . spironolactone (ALDACTONE) 25 MG tablet Take 0.5 tablets (12.5 mg total) by mouth daily.     Allergies:   Patient has no known allergies.   Social History   Socioeconomic History  . Marital status: Single    Spouse name: Not on file  . Number of children: Not on file  . Years of education: Not on file  . Highest education level: Not on file  Occupational History  . Occupation: Production designer, theatre/television/filmManager at [employer].  Employer: [employer]  Social Needs  . Financial resource strain: Not on file  . Food insecurity    Worry: Not on file    Inability: Not on file  . Transportation needs    Medical: Not on file    Non-medical: Not on file  Tobacco Use  . Smoking status: Former Smoker    Packs/day: 0.25    Years: 10.00    Pack years: 2.50    Types: Cigarettes    Quit date: 01/26/2018    Years since quitting: 1.2  . Smokeless tobacco: Former Engineer, waterUser  Substance and Sexual Activity  . Alcohol use: No  . Drug use: No  . Sexual activity: Not on file  Lifestyle  . Physical activity    Days per week: Not on file    Minutes per  session: Not on file  . Stress: Not on file  Relationships  . Social Musicianconnections    Talks on phone: Not on file    Gets together: Not on file    Attends religious service: Not on file    Active member of club or organization: Not on file    Attends meetings of clubs or organizations: Not on file    Relationship status: Not on file  Other Topics Concern  . Not on file  Social History Narrative   Lives with two children.       Family History:  The patient's family history includes Anemia in her father; Diabetes in her mother; Hypertension in her father and mother.   ROS:   Please see the history of present illness.    Review of Systems  Constitution: Negative.  HENT: Negative.   Eyes: Negative.   Cardiovascular: Positive for dyspnea on exertion.  Respiratory: Positive for cough, sleep disturbances due to breathing and snoring.   Hematologic/Lymphatic: Negative.   Musculoskeletal: Negative.  Negative for joint pain.  Gastrointestinal: Negative.   Genitourinary: Negative.   Neurological: Negative.    All other systems reviewed and are negative.   PHYSICAL EXAM:   VS:  BP 136/78   Pulse 86   Ht 5\' 3"  (1.6 m)   Wt 262 lb 12.8 oz (119.2 kg)   LMP 12/19/2005   SpO2 95%   BMI 46.55 kg/m   Physical Exam  GEN: Obese, in no acute distress  Neck: no JVD, carotid bruits, or masses Cardiac:RRR; 2/6 to 3/6 systolic murmur left sternal border Respiratory: Decreased breath sounds throughout without rales or rhonchi, upper respiratory wheezing  GI: soft, nontender, nondistended, + BS Ext: without cyanosis, clubbing, or edema, Good distal pulses bilaterally Neuro:  Alert and Oriented x 3 Psych: euthymic mood, full affect  Wt Readings from Last 3 Encounters:  04/21/19 262 lb 12.8 oz (119.2 kg)  03/17/19 264 lb (119.7 kg)  02/15/19 250 lb (113.4 kg)      Studies/Labs Reviewed:   EKG:  EKG is not ordered today.   Recent Labs: 08/18/2018: ALT 37 10/09/2018: Hemoglobin 12.4;  Platelets 253 02/17/2019: NT-Pro BNP 58 03/17/2019: BUN 12; Creatinine, Ser 0.64; Potassium 4.0; Sodium 139; TSH 1.090   Lipid Panel No results found for: CHOL, TRIG, HDL, CHOLHDL, VLDL, LDLCALC, LDLDIRECT  Additional studies/ records that were reviewed today include:  CT 11/03/2018 IMPRESSION: 1. Coronary calcium score of 0. This was 0 percentile for age and sex matched control.   2. Normal coronary origin with left dominance.   3. This is a poor quality study secondary to patient's size and motion. However, there no evidence  of CAD in the visualized portions of the coronary arteries.   4. Dilated pulmonary artery measuring 35 mm suggestive of pulmonary hypertension.   Electronically Signed: By: Tobias AlexanderKatarina  Pasqualino Witherspoon On: 11/03/2018 15:14   2D echo 07/2018 Study Conclusions   - Left ventricle: The cavity size was normal. There was mild focal   basal hypertrophy of the septum. Systolic function was normal.   The estimated ejection fraction was in the range of 60% to 65%.   Wall motion was normal; there were no regional wall motion   abnormalities. There was an increased relative contribution of   atrial contraction to ventricular filling. Features are   consistent with a pseudonormal left ventricular filling pattern,   with concomitant abnormal relaxation and increased filling   pressure (grade 2 diastolic dysfunction). Doppler parameters are   consistent with high ventricular filling pressure. - Aortic valve: There was mild regurgitation. Valve area (VTI):   2.36 cm^2. Valve area (Vmax): 2.56 cm^2. Valve area (Vmean): 2.44   cm^2. - Aorta: Ascending aortic diameter: 40 mm (S). - Ascending aorta: The ascending aorta was mildly dilated.      ASSESSMENT:    1. Chronic diastolic heart failure (HCC)   2. Essential hypertension      PLAN:  In order of problems listed above:  Acute on Chronic diastolic CHF with recent increase in shortness of breath.  2D echo 07/2018 normal  LVEF, grade 2 DD.  Coronary CTA showed no evidence of CAD, calcium score of 0, question of pulmonary hypertension.  The patient was started on Lasix, Spironolactone and olmesartan.  Blood pressure improved.  Renal function was checked in January and was normal.  Considering she has muscle cramping we will check her electrolytes including magnesium and calcium today.  Essential hypertension blood pressure improved.  Morbid obesity patient has struggled with weight loss despite being on Belviq XR-we will refer to weight loss center.  Cough variant asthma with ongoing wheezing.  She can hardly walk without wheezing.  Recommend she follow-up with pulmonary Dr Shona SimpsonWertz.  Medication Adjustments/Labs and Tests Ordered: Current medicines are reviewed at length with the patient today.  Concerns regarding medicines are outlined above.  Medication changes, Labs and Tests ordered today are listed in the Patient Instructions below. Patient Instructions  Medication Instructions:  Your physician recommends that you continue on your current medications as directed. Please refer to the Current Medication list given to you today.  If you need a refill on your cardiac medications before your next appointment, please call your pharmacy.   Lab work: TODAY:  BMET, MAG, TSH, LFT'S, & CALCIUM  If you have labs (blood work) drawn today and your tests are completely normal, you will receive your results only by: Marland Kitchen. MyChart Message (if you have MyChart) OR . A paper copy in the mail If you have any lab test that is abnormal or we need to change your treatment, we will call you to review the results.  Testing/Procedures: None ordered  Follow-Up: At [facility], you and your health needs are our priority.  As part of our continuing mission to provide you with exceptional heart care, we have created designated Provider Care Teams.  These Care Teams include your primary Cardiologist (physician) and Advanced Practice  Providers (APPs -  Physician Assistants and Nurse Practitioners) who all work together to provide you with the care you need, when you need it. You will need a follow up appointment in 4 months.  Please call our office  2 months in advance to schedule this appointment.  You may see Ena Dawley, MD or one of the following Advanced Practice Providers on your designated Care Team:   [place], PA-C Melina Copa, PA-C . Ermalinda Barrios, PA-C  Any Other Special Instructions Will Be Listed Below (If Applicable).      Signed, Ena Dawley, MD  04/21/2019 2:30 PM    [address], [place], [place]  [address] Phone: [phone]; Fax: [phone]

## 2019-04-21 NOTE — Patient Instructions (Signed)
Medication Instructions:  Your physician recommends that you continue on your current medications as directed. Please refer to the Current Medication list given to you today.  If you need a refill on your cardiac medications before your next appointment, please call your pharmacy.   Lab work: TODAY:  BMET, MAG, TSH, LFT'S, & CALCIUM  If you have labs (blood work) drawn today and your tests are completely normal, you will receive your results only by: Marland Kitchen MyChart Message (if you have MyChart) OR . A paper copy in the mail If you have any lab test that is abnormal or we need to change your treatment, we will call you to review the results.  Testing/Procedures: None ordered  Follow-Up: At [facility], you and your health needs are our priority.  As part of our continuing mission to provide you with exceptional heart care, we have created designated Provider Care Teams.  These Care Teams include your primary Cardiologist (physician) and Advanced Practice Providers (APPs -  Physician Assistants and Nurse Practitioners) who all work together to provide you with the care you need, when you need it. You will need a follow up appointment in 4 months.  Please call our office 2 months in advance to schedule this appointment.  You may see Ena Dawley, MD or one of the following Advanced Practice Providers on your designated Care Team:   [place], PA-C Melina Copa, PA-C . Ermalinda Barrios, PA-C  Any Other Special Instructions Will Be Listed Below (If Applicable).

## 2019-04-22 LAB — BASIC METABOLIC PANEL
BUN/Creatinine Ratio: 17 (ref 9–23)
BUN: 13 mg/dL (ref 6–24)
CO2: 24 mmol/L (ref 20–29)
Calcium: 9.8 mg/dL (ref 8.7–10.2)
Chloride: 104 mmol/L (ref 96–106)
Creatinine, Ser: 0.75 mg/dL (ref 0.57–1.00)
GFR calc Af Amer: 107 mL/min/{1.73_m2} (ref >59)
GFR calc non Af Amer: 93 mL/min/{1.73_m2} (ref >59)
Glucose: 65 mg/dL (ref 65–99)
Potassium: 4.2 mmol/L (ref 3.5–5.2)
Sodium: 143 mmol/L (ref 134–144)

## 2019-04-22 LAB — HEPATIC FUNCTION PANEL
ALT: 28 IU/L (ref 0–32)
AST: 23 IU/L (ref 0–40)
Albumin: 4.3 g/dL (ref 3.8–4.9)
Alkaline Phosphatase: 89 IU/L (ref 39–117)
Bilirubin Total: 0.5 mg/dL (ref 0.0–1.2)
Bilirubin, Direct: 0.14 mg/dL (ref 0.00–0.40)
Total Protein: 7.4 g/dL (ref 6.0–8.5)

## 2019-04-22 LAB — MAGNESIUM: Magnesium: 2 mg/dL (ref 1.6–2.3)

## 2019-04-22 LAB — TSH: TSH: 1.03 u[IU]/mL (ref 0.450–4.500)

## 2019-05-15 ENCOUNTER — Other Ambulatory Visit: Payer: Self-pay | Admitting: Internal Medicine

## 2019-05-17 ENCOUNTER — Encounter: Payer: Self-pay | Admitting: Internal Medicine

## 2019-05-17 ENCOUNTER — Other Ambulatory Visit (INDEPENDENT_AMBULATORY_CARE_PROVIDER_SITE_OTHER): Payer: Self-pay | Admitting: Bariatrics

## 2019-05-17 ENCOUNTER — Other Ambulatory Visit: Payer: Self-pay

## 2019-05-17 ENCOUNTER — Ambulatory Visit (INDEPENDENT_AMBULATORY_CARE_PROVIDER_SITE_OTHER): Payer: BC Managed Care – PPO | Admitting: Bariatrics

## 2019-05-17 ENCOUNTER — Encounter (INDEPENDENT_AMBULATORY_CARE_PROVIDER_SITE_OTHER): Payer: Self-pay | Admitting: Bariatrics

## 2019-05-17 VITALS — BP 146/91 | HR 68 | Temp 98.3°F | Ht 64.0 in | Wt 261.0 lb

## 2019-05-17 DIAGNOSIS — J45909 Unspecified asthma, uncomplicated: Secondary | ICD-10-CM

## 2019-05-17 DIAGNOSIS — Z9189 Other specified personal risk factors, not elsewhere classified: Secondary | ICD-10-CM

## 2019-05-17 DIAGNOSIS — E119 Type 2 diabetes mellitus without complications: Secondary | ICD-10-CM

## 2019-05-17 DIAGNOSIS — R5383 Other fatigue: Secondary | ICD-10-CM

## 2019-05-17 DIAGNOSIS — F3289 Other specified depressive episodes: Secondary | ICD-10-CM

## 2019-05-17 DIAGNOSIS — R0602 Shortness of breath: Secondary | ICD-10-CM

## 2019-05-17 DIAGNOSIS — E559 Vitamin D deficiency, unspecified: Secondary | ICD-10-CM

## 2019-05-17 DIAGNOSIS — Z0289 Encounter for other administrative examinations: Secondary | ICD-10-CM

## 2019-05-17 DIAGNOSIS — I5032 Chronic diastolic (congestive) heart failure: Secondary | ICD-10-CM

## 2019-05-17 DIAGNOSIS — I10 Essential (primary) hypertension: Secondary | ICD-10-CM

## 2019-05-17 DIAGNOSIS — Z6841 Body Mass Index (BMI) 40.0 and over, adult: Secondary | ICD-10-CM

## 2019-05-17 MED ORDER — OZEMPIC (0.25 OR 0.5 MG/DOSE) 2 MG/1.5ML ~~LOC~~ SOPN: 0.5000 mg | PEN_INJECTOR | SUBCUTANEOUS | 0 refills | Status: DC

## 2019-05-17 NOTE — Telephone Encounter (Signed)
TRAMADOL REFILL 

## 2019-05-18 LAB — VITAMIN D 25 HYDROXY (VIT D DEFICIENCY, FRACTURES): Vit D, 25-Hydroxy: 23.1 ng/mL — ABNORMAL LOW (ref 30.0–100.0)

## 2019-05-18 LAB — LIPID PANEL WITH LDL/HDL RATIO
Cholesterol, Total: 154 mg/dL (ref 100–199)
HDL: 55 mg/dL (ref >39)
LDL Calculated: 86 mg/dL (ref 0–99)
LDl/HDL Ratio: 1.6 ratio (ref 0.0–3.2)
Triglycerides: 65 mg/dL (ref 0–149)
VLDL Cholesterol Cal: 13 mg/dL (ref 5–40)

## 2019-05-18 LAB — MICROALBUMIN / CREATININE URINE RATIO
Creatinine, Urine: 208.2 mg/dL
Microalb/Creat Ratio: 2 mg/g creat (ref 0–29)
Microalbumin, Urine: 4.9 ug/mL

## 2019-05-18 LAB — HEMOGLOBIN A1C
Est. average glucose Bld gHb Est-mCnc: 131 mg/dL
Hgb A1c MFr Bld: 6.2 % — ABNORMAL HIGH (ref 4.8–5.6)

## 2019-05-19 NOTE — Progress Notes (Signed)
Office: [phone]  /  Fax: [phone]   Dear Dr. Meda Coffee and Dr. Baird Cancer,   Thank you for referring Jessica Snow to our clinic. The following note includes my evaluation and treatment recommendations.  HPI:   Chief Complaint: OBESITY    Jessica Snow has been referred by Dorothy Spark, MD and Glendale Chard, MD for consultation regarding her obesity and obesity related comorbidities.    Jessica Snow (MR# [id number]) is a 53 y.o. female who presents on 05/17/2019 for obesity evaluation and treatment. Current BMI is Body mass index is 44.8 kg/m.Jessica Snow has been struggling with her weight for many years and has been unsuccessful in either losing weight, maintaining weight loss, or reaching her healthy weight goal.     Breezy attended our information session and states she is currently in the action stage of change and ready to dedicate time achieving and maintaining a healthier weight. Ardella is interested in becoming our patient and working on intensive lifestyle modifications including (but not limited to) diet, exercise and weight loss.    Bekah states her family eats meals together she thinks her family will eat healthier with  her she struggles with family and or coworkers weight loss sabotage her desired weight loss is 101 to 111 lbs. she started gaining weight in the last 10 years her heaviest weight ever was 272 lbs. she craves pasta and sweet potatoes she sometimes snacks at night she skips breakfast sometimes she is frequently drinking liquids with calories she frequently makes poor food choices she struggles with emotional eating    Fatigue [name] feels her energy is lower than it should be. This has worsened with weight gain and has not worsened recently. [name] admits to daytime somnolence and she admits to waking up still tired. Patient had a sleep study in June 2019 (no clinically significant sleep apnea). Patent has a history of symptoms of daytime  fatigue, morning fatigue, morning headache and hypertension. Patient generally gets 5 or 6 hours of sleep per night, and states they generally have restless sleep. Snoring is present. Apneic episodes are present. Epworth Sleepiness Score is 15  Dyspnea on exertion Madine notes increasing shortness of breath with certain activities and seems to be worsening over time with weight gain. She notes getting out of breath sooner with activity than she used to. This has not gotten worse recently. [name] admits to orthopnea.  Hypertension Jessica Snow is a 53 y.o. female with hypertension. She is taking Olmesartan and Spironolactone. Jessica Snow did not take her medications this morning. Jessica Snow denies chest pain. She is working weight loss to help control her blood pressure with the goal of decreasing her risk of heart attack and stroke. [name] blood pressure is not well controlled today.  Asthma [name] has a diagnosis of asthma (cough variant), and she uses Albuterol daily.   Diabetes II (new) [name] has a new diagnosis of diabetes type II. [name] denies relative or absolute contraindication to Ozempic. Her last A1c was at 6.5  She has been working on intensive lifestyle modifications including diet, exercise, and weight loss to help control her blood glucose levels.  Diastolic Heart Failure [name] has a diagnosis of diastolic heart failure. She had echocardiogram in October 2019 which shows LV ejection fraction of 60 to 65%. [name] also has a diagnosis of heart murmur.  Vitamin D deficiency Jessica Snow has a diagnosis of vitamin D deficiency. Jessica Snow is not currently taking vit D and she denies nausea, vomiting  or muscle weakness.  At risk for osteopenia and osteoporosis Jessica KocherRegina is at higher risk of osteopenia and osteoporosis due to vitamin D deficiency.   Depression with emotional eating behaviors Jessica KocherRegina is struggling with emotional eating and using food for comfort to the extent that it is  negatively impacting her health. She often snacks when she is not hungry. Jessica KocherRegina sometimes feels she is out of control and then feels guilty that she made poor food choices. She is attempting to work on behavior modification techniques to help reduce her emotional eating. She shows no sign of suicidal or homicidal ideations. PHQ-9 Score is 18  Depression Screen Jessica Snow's Food and Mood (modified PHQ-9) score was  Depression screen PHQ 2/9 05/17/2019  Decreased Interest 3  Down, Depressed, Hopeless 2  PHQ - 2 Score 5  Altered sleeping 3  Tired, decreased energy 3  Change in appetite 2  Feeling bad or failure about yourself  3  Trouble concentrating 2  Moving slowly or fidgety/restless 0  Suicidal thoughts 0  PHQ-9 Score 18  Difficult doing work/chores -    ASSESSMENT AND PLAN:  Other fatigue - Plan: EKG 12-Lead  Shortness of breath on exertion - Plan: Lipid Panel With LDL/HDL Ratio  Essential hypertension  Uncomplicated asthma, unspecified asthma severity, unspecified whether persistent  Type 2 diabetes mellitus without complication, without long-term current use of insulin (HCC) - Plan: Microalbumin / creatinine urine ratio, Semaglutide,0.25 or 0.5MG /DOS, (OZEMPIC, 0.25 OR 0.5 MG/DOSE,) 2 MG/1.5ML SOPN  Other depression  Chronic diastolic heart failure (HCC)  Vitamin D deficiency - Plan: VITAMIN D 25 Hydroxy (Vit-D Deficiency, Fractures)  At risk for osteoporosis  Class 3 severe obesity with serious comorbidity and body mass index (BMI) of 40.0 to 44.9 in adult, unspecified obesity type (HCC)  PLAN:  Fatigue Jessica KocherRegina was informed that her fatigue may be related to obesity, depression or many other causes. Labs will be ordered, and in the meanwhile Jessica KocherRegina has agreed to work on diet, exercise and weight loss to help with fatigue. Proper sleep hygiene was discussed including the need for 7-8 hours of quality sleep each night.   Dyspnea on exertion Jessica Snow's shortness of breath  appears to be obesity related and exercise induced. She has agreed to work on weight loss and gradually increase exercise to treat her exercise induced shortness of breath. If Jessica KocherRegina follows our instructions and loses weight without improvement of her shortness of breath, we will plan to refer to pulmonology. We will monitor this condition regularly. Jessica KocherRegina agrees to this plan.  Hypertension We discussed sodium restriction, working on healthy weight loss, and a regular exercise program as the means to achieve improved blood pressure control. Jessica KocherRegina agreed with this plan and agreed to follow up as directed. We will continue to monitor her blood pressure as well as her progress with the above lifestyle modifications. She will continue her medications as prescribed and she will take her medications consistently. Jessica KocherRegina will watch for signs of hypotension as she continues her lifestyle modifications.  Asthma Jessica KocherRegina will use her inhaler as advised. She will use her inhaler before exercise. Jessica KocherRegina will follow up with our clinic in 2 weeks.  Diabetes II (new) Jessica KocherRegina has been given extensive diabetes education by myself today including ideal fasting and post-prandial blood glucose readings, individual ideal Hgb A1c goals and hypoglycemia prevention. We discussed the importance of good blood sugar control to decrease the likelihood of diabetic complications such as nephropathy, neuropathy, limb loss, blindness, coronary artery disease, and  death. We discussed the importance of intensive lifestyle modification including diet, exercise and weight loss as the first line treatment for diabetes. Shaylen agrees to start Ozempic (0.25-0.50) per start 0.25 mg subQ once weekly for 4 weeks #1 pen with no refills and follow up at the agreed upon time.  Diastolic Heart Failure Othelia will follow up with the cardiologist. She will follow up with our clinic in 2 weeks.  Vitamin D Deficiency Kadin was informed that low  vitamin D levels contributes to fatigue and are associated with obesity, breast, and colon cancer. We will plan on checking her vitamin D level and she will follow up for routine testing of vitamin D, at least 2-3 times per year.   At risk for osteopenia and osteoporosis Angi was given extended  (15 minutes) osteoporosis prevention counseling today. Hannia is at risk for osteopenia and osteoporosis due to her vitamin D deficiency. She was encouraged to take her vitamin D and follow her higher calcium diet and increase strengthening exercise to help strengthen her bones and decrease her risk of osteopenia and osteoporosis.  Depression with Emotional Eating Behaviors We discussed behavior modification techniques today to help Sierrah deal with her emotional eating and depression. We will refer patient to Dr. Dewaine Conger (bariatric psychologist).  Depression Screen Roshell had a strongly positive depression screening. Depression is commonly associated with obesity and often results in emotional eating behaviors. We will monitor this closely and work on CBT to help improve the non-hunger eating patterns.   Obesity Elanor is currently in the action stage of change and her goal is to continue with weight loss efforts. I recommend Kacie begin the structured treatment plan as follows:  She has agreed to follow the category 3 plan  Brendaliz has been instructed to eventually work up to a goal of 150 minutes of combined cardio and strengthening exercise per week for weight loss and overall health benefits. We discussed the following Behavioral Modification Strategies today: increase H2O intake, no skipping meals, keeping healthy foods in the home, increasing lean protein intake, decreasing simple carbohydrates, increasing vegetables, decrease eating out and work on meal planning and intentional eating Lafonda will stop soda and she will weigh her meats.   She was informed of the importance of frequent follow up  visits to maximize her success with intensive lifestyle modifications for her multiple health conditions. She was informed we would discuss her lab results at her next visit unless there is a critical issue that needs to be addressed sooner. Tora agreed to keep her next visit at the agreed upon time to discuss these results.  ALLERGIES: Allergies  Allergen Reactions  . Honey Bee Treatment [Bee Venom]   . Wheat Bran     MEDICATIONS: Current Outpatient Medications on File Prior to Visit  Medication Sig Dispense Refill  . albuterol (PROVENTIL) (2.5 MG/3ML) 0.083% nebulizer solution 1 vial in neb every 4 hours as needed    . albuterol (VENTOLIN HFA) 108 (90 Base) MCG/ACT inhaler TAKE 2 PUFFS BY MOUTH EVERY 4 TO 6 HOURS AS NEEDED 8.5 Inhaler 11  . benzonatate (TESSALON) 200 MG capsule TAKE 1 CAPSULE BY MOUTH EVERY DAY 3 TIMES A DAY AS NEEDED FOR COUGH    . budesonide-formoterol (SYMBICORT) 80-4.5 MCG/ACT inhaler Inhale 2 puffs into the lungs 2 (two) times daily. 1 Inhaler 11  . famotidine (PEPCID) 20 MG tablet One at bedtime 30 tablet 11  . furosemide (LASIX) 40 MG tablet Take 1 tablet (40 mg total) by mouth  daily. 90 tablet 3  . gabapentin (NEURONTIN) 100 MG capsule TAKE 1 CAPSULE (100 MG TOTAL) BY MOUTH 3 (THREE) TIMES DAILY. 90 capsule 0  . montelukast (SINGULAIR) 10 MG tablet Take 1 tablet (10 mg total) by mouth at bedtime. 30 tablet 11  . olmesartan (BENICAR) 40 MG tablet Take 1 tablet (40 mg total) by mouth daily. 90 tablet 1  . pantoprazole (PROTONIX) 40 MG tablet Take 1 tablet by mouth daily 180 tablet 0  . spironolactone (ALDACTONE) 25 MG tablet Take 0.5 tablets (12.5 mg total) by mouth daily. 45 tablet 3   No current facility-administered medications on file prior to visit.     PAST MEDICAL HISTORY: Past Medical History:  Diagnosis Date  . Asthma   . Chronic rhinitis   . Cough   . Edema, lower extremity   . GERD (gastroesophageal reflux disease)   . HTN (hypertension)   .  Morbid obesity (HCC)   . Murmur   . Pneumonia   . Pre-diabetes   . SOB (shortness of breath)     PAST SURGICAL HISTORY: Past Surgical History:  Procedure Laterality Date  . TOTAL VAGINAL HYSTERECTOMY  2007    SOCIAL HISTORY: Social History   Tobacco Use  . Smoking status: Former Smoker    Packs/day: 0.25    Years: 10.00    Pack years: 2.50    Types: Cigarettes    Quit date: 01/26/2018    Years since quitting: 1.3  . Smokeless tobacco: Former Engineer, waterUser  Substance Use Topics  . Alcohol use: No  . Drug use: No    FAMILY HISTORY: Family History  Problem Relation Age of Onset  . Hypertension Mother   . Diabetes Mother   . Heart disease Mother   . Sleep apnea Mother   . Obesity Mother   . Hypertension Father   . Anemia Father     ROS: Review of Systems  Constitutional: Positive for malaise/fatigue.  Eyes:       + Wear Glasses or Contacts  Respiratory: Positive for cough, shortness of breath and wheezing.   Cardiovascular: Positive for orthopnea. Negative for chest pain.       Positive for Shortness of Breath with Activity  Gastrointestinal: Positive for heartburn. Negative for nausea and vomiting.  Musculoskeletal: Positive for back pain.       Negative for muscle weakness  Skin: Positive for itching and rash.       Positive for Dryness  Neurological: Positive for headaches.  Endo/Heme/Allergies: Bruises/bleeds easily.  Psychiatric/Behavioral: Positive for depression. Negative for suicidal ideas. The patient has insomnia.        Positive for Stress    PHYSICAL EXAM: Blood pressure (!) 146/91, pulse 68, temperature 98.3 F (36.8 C), temperature source Oral, height 5\' 4"  (1.626 m), weight 261 lb (118.4 kg), last menstrual period 12/19/2005, SpO2 96 %. Body mass index is 44.8 kg/m. Physical Exam Vitals signs reviewed.  Constitutional:      Appearance: Normal appearance. She is well-developed. She is obese.  HENT:     Head: Normocephalic and atraumatic.      Nose: Nose normal.  Eyes:     General: No scleral icterus.    Extraocular Movements: Extraocular movements intact.  Neck:     Musculoskeletal: Normal range of motion and neck supple.     Thyroid: No thyromegaly.  Cardiovascular:     Rate and Rhythm: Normal rate and regular rhythm.     Heart sounds: Murmur present. Systolic murmur present with  a grade of 1/6.  Pulmonary:     Effort: Pulmonary effort is normal. No respiratory distress.  Abdominal:     Palpations: Abdomen is soft.     Tenderness: There is no abdominal tenderness.  Musculoskeletal: Normal range of motion.     Comments: Range of Motion normal in all 4 extremities  Skin:    General: Skin is warm and dry.  Neurological:     Mental Status: She is alert and oriented to person, place, and time.     Coordination: Coordination normal.  Psychiatric:        Mood and Affect: Mood normal.        Behavior: Behavior normal.        Thought Content: Thought content does not include homicidal or suicidal ideation.     RECENT LABS AND TESTS: BMET    Component Value Date/Time   NA 143 04/21/2019 1410   K 4.2 04/21/2019 1410   CL 104 04/21/2019 1410   CO2 24 04/21/2019 1410   GLUCOSE 65 04/21/2019 1410   GLUCOSE 97 08/31/2018 1149   BUN 13 04/21/2019 1410   CREATININE 0.75 04/21/2019 1410   CREATININE 0.66 12/20/2011 1710   CALCIUM 9.8 04/21/2019 1410   GFRNONAA 93 04/21/2019 1410   GFRAA 107 04/21/2019 1410   Lab Results  Component Value Date   HGBA1C 6.2 (H) 05/17/2019   No results found for: INSULIN CBC    Component Value Date/Time   WBC 7.4 10/09/2018 1138   WBC 7.3 05/15/2018 1113   RBC 4.86 10/09/2018 1138   RBC 4.95 05/15/2018 1113   HGB 12.4 10/09/2018 1138   HCT 38.3 10/09/2018 1138   PLT 253 10/09/2018 1138   MCV 79 10/09/2018 1138   MCH 25.5 (L) 10/09/2018 1138   MCH 26.6 10/08/2013 0953   MCHC 32.4 10/09/2018 1138   MCHC 32.0 05/15/2018 1113   RDW 15.8 (H) 10/09/2018 1138   LYMPHSABS 2.6  08/18/2018 1019   MONOABS 0.5 05/15/2018 1113   EOSABS 0.4 08/18/2018 1019   BASOSABS 0.0 08/18/2018 1019   Iron/TIBC/Ferritin/ %Sat No results found for: IRON, TIBC, FERRITIN, IRONPCTSAT Lipid Panel     Component Value Date/Time   CHOL 154 05/17/2019 1402   TRIG 65 05/17/2019 1402   HDL 55 05/17/2019 1402   LDLCALC 86 05/17/2019 1402   Hepatic Function Panel     Component Value Date/Time   PROT 7.4 04/21/2019 1410   ALBUMIN 4.3 04/21/2019 1410   AST 23 04/21/2019 1410   ALT 28 04/21/2019 1410   ALKPHOS 89 04/21/2019 1410   BILITOT 0.5 04/21/2019 1410   BILIDIR 0.14 04/21/2019 1410      Component Value Date/Time   TSH 1.030 04/21/2019 1410   TSH 1.090 03/17/2019 1516    ECG  shows NSR with a rate of 65 BPM INDIRECT CALORIMETER done today shows a VO2 of 281 and a REE of 1955.  Her calculated basal metabolic rate is 1610 thus her basal metabolic rate is better than expected.      OBESITY BEHAVIORAL INTERVENTION VISIT  Today's visit was # 1   Starting weight: 261 lbs Starting date: 05/17/2019 Today's weight : 261 lbs Today's date: 05/17/2019 Total lbs lost to date: 0    05/17/2019  Height  (1.626 m)  Weight 261 lb (118.4 kg)  BMI (Calculated) 44.78  BLOOD PRESSURE - SYSTOLIC 146  BLOOD PRESSURE - DIASTOLIC 91  Waist Measurement  52 inches   Body Fat % 52.7 %  Total Body Water (lbs) 95.8 lbs  RMR 1955    ASK: We discussed the diagnosis of obesity with Micheline Roughegina D Meinecke today and Jessica KocherRegina agreed to give us permission to discuss obesity behavioral modification therapy today.  ASSESS: Jessica KocherRegina has the diagnosis of obesity and her BMI today is 44.78 Jessica KocherRegina is in the action stage of change   ADVISE: Jessica KocherRegina was educated on the multiple health risks of obesity as well as the benefit of weight loss to improve her health. She was advised of the need for long term treatment and the importance of lifestyle modifications to improve her current health and to decrease  her risk of future health problems.  AGREE: Multiple dietary modification options and treatment options were discussed and  Jessica KocherRegina agreed to follow the recommendations documented in the above note.  ARRANGE: Jessica KocherRegina was educated on the importance of frequent visits to treat obesity as outlined per CMS and USPSTF guidelines and agreed to schedule her next follow up appointment today.  Cristi LoronI, Joanne Murray, am acting as Energy managertranscriptionist for [employer] Corporation[employer]el A. Manson PasseyBrown, DO  I have reviewed the above documentation for accuracy and completeness, and I agree with the above. -Corinna CapraAngel Davan Hark, DO

## 2019-05-25 ENCOUNTER — Encounter (INDEPENDENT_AMBULATORY_CARE_PROVIDER_SITE_OTHER): Payer: Self-pay

## 2019-05-25 ENCOUNTER — Ambulatory Visit (INDEPENDENT_AMBULATORY_CARE_PROVIDER_SITE_OTHER): Payer: BC Managed Care – PPO | Admitting: Psychology

## 2019-05-25 ENCOUNTER — Telehealth (INDEPENDENT_AMBULATORY_CARE_PROVIDER_SITE_OTHER): Payer: Self-pay | Admitting: Psychology

## 2019-05-25 NOTE — Progress Notes (Unsigned)
Office: [phone]  /  Fax: [phone]    Date: May 25, 2019    Appointment Start Time:*** Duration:*** Provider: Glennie Isle, Psy.D. Type of Session: Intake for Individual Therapy  Location of Patient: *** Location of Provider: Provider's Home Type of Contact: Telepsychological Visit via [employer] [name]  Informed Consent: Prior to proceeding with today's appointment, two pieces of identifying information were obtained from [facility] to verify identity. In addition, Clotiel's physical location at the time of this appointment was obtained. Amirah reported she was at *** and provided the address. In the event of technical difficulties, Adriena shared a phone number she could be reached at. Elizzie and this provider participated in today's telepsychological service. Also, Kodie denied anyone else being present in the room or on the [name] appointment***.   The provider's role was explained to [name]. The provider reviewed and discussed issues of confidentiality, privacy, and limits therein (e.g., reporting obligations). In addition to verbal informed consent, written informed consent for psychological services was obtained from [place] prior to the initial intake interview. Written consent included information concerning the practice, financial arrangements, and confidentiality and patients' rights. Since the clinic is not a 24/7 crisis center, mental health emergency resources were shared, and the provider explained MyChart, e-mail, voicemail, and/or other messaging systems should be utilized only for non-emergency reasons. This provider also explained that information obtained during appointments will be placed in Mariona's medical record in a confidential manner and relevant information will be shared with other providers at [facility] that she meets with for coordination of care. Mellody verbally acknowledged understanding of the aforementioned, and agreed to use mental health  emergency resources discussed if needed. Moreover, Alexas agreed information may be shared with other [facility] providers as needed for coordination of care. By signing the service agreement document, Arlyn provided written consent for coordination of care.   Prior to initiating telepsychological services, Pamlea was provided with an informed consent document, which included the development of a safety plan (i.e., an emergency contact and emergency resources) in the event of an emergency/crisis. Fendi expressed understanding of the rationale of the safety plan and provided consent for this provider to reach out to her emergency contact in the event of an emergency/crisis. Jonathon returned the completed consent form prior to today's appointment. This provider verbally reviewed the consent form during today's appointment prior to proceeding with the appointment. Sahana verbally acknowledged understanding that she is ultimately responsible for understanding her insurance benefits as it relates to reimbursement of telepsychological and in-person services. This provider also reviewed confidentiality, as it relates to telepsychological services, as well as the rationale for telepsychological services. More specifically, this provider's clinic is limiting in-person visits due to COVID-19. Therapeutic services will resume to in-person appointments once deemed appropriate. Amritha expressed understanding regarding the rationale for telepsychological services. In addition, this provider explained the telepsychological services informed consent document would be considered an addendum to the initial consent document/service agreement. Neri verbally consented to proceed.   Chief Complaint/HPI: Dimonique was referred by Dr. [name] due to depression with emotional eating behaviors. Per the note for the initial visit with Dr. [name] on May 17, 2019, "Tessie is struggling with emotional eating and  using food for comfort to the extent that it is negatively impacting her health. She often snacks when she is not hungry. Tallyn sometimes feels she is out of control and then feels guilty that she made poor food choices. She is  attempting to work on behavior modification techniques to help reduce her emotional eating. She shows no sign of suicidal or homicidal ideations. PHQ-9 Score is 18." Amyrah further reported experiencing the following: frequently drinking liquids with calories, frequently making poor food choices, struggling with emotional eating, sometimes snacking at night and sometimes skipping breakfast.    During today's appointment, [name] reported ***. Tian was verbally administered a questionnaire assessing various behaviors related to emotional eating. Bill endorsed the following: {gbmoodandfood:21755}. Chart review revealed Lynasia craves pasta and sweet potatoes.   Moreover, Carmesha indicated *** triggers emotional eating, whereas *** makes emotional eating better. Furthermore, Oneita {gblegal:22371} other problems of concern. ***   Mental Status Examination:  Appearance: {Appearance:22431} Behavior: {Behavior:22445} Mood: {Teletherapy mood:22435} Affect: {Affect:22436} Speech: {Speech:22432} Eye Contact: {Eye Contact:22433} Psychomotor Activity: {Motor Activity:22434} Thought Process: {thought process:22448}  Content/Perceptual Disturbances: {disturbances:22451} Orientation: {Orientation:22437} Cognition/Sensorium: {gbcognition:22449} Insight: {Insight:22446} Judgment: {Insight:22446}  Family & Psychosocial History: Simi reported she is ***. She indicated she is currently ***. Additionally, Dainelle shared her highest level of education obtained is ***. Currently, Kura's social support system consists of ***. Moreover, Meika stated she resides with ***.   Medical History:  Past Medical History:  Diagnosis Date   Asthma    Chronic rhinitis    Cough    Edema,  lower extremity    GERD (gastroesophageal reflux disease)    HTN (hypertension)    Morbid obesity (HCC)    Murmur    Pneumonia    Pre-diabetes    SOB (shortness of breath)    Past Surgical History:  Procedure Laterality Date   TOTAL VAGINAL HYSTERECTOMY  2007   Current Outpatient Medications on File Prior to Visit  Medication Sig Dispense Refill   albuterol (PROVENTIL) (2.5 MG/3ML) 0.083% nebulizer solution 1 vial in neb every 4 hours as needed     albuterol (VENTOLIN HFA) 108 (90 Base) MCG/ACT inhaler TAKE 2 PUFFS BY MOUTH EVERY 4 TO 6 HOURS AS NEEDED 8.5 Inhaler 11   benzonatate (TESSALON) 200 MG capsule TAKE 1 CAPSULE BY MOUTH EVERY DAY 3 TIMES A DAY AS NEEDED FOR COUGH     budesonide-formoterol (SYMBICORT) 80-4.5 MCG/ACT inhaler Inhale 2 puffs into the lungs 2 (two) times daily. 1 Inhaler 11   famotidine (PEPCID) 20 MG tablet One at bedtime 30 tablet 11   furosemide (LASIX) 40 MG tablet Take 1 tablet (40 mg total) by mouth daily. 90 tablet 3   gabapentin (NEURONTIN) 100 MG capsule TAKE 1 CAPSULE (100 MG TOTAL) BY MOUTH 3 (THREE) TIMES DAILY. 90 capsule 0   montelukast (SINGULAIR) 10 MG tablet Take 1 tablet (10 mg total) by mouth at bedtime. 30 tablet 11   olmesartan (BENICAR) 40 MG tablet Take 1 tablet (40 mg total) by mouth daily. 90 tablet 1   pantoprazole (PROTONIX) 40 MG tablet Take 1 tablet by mouth daily 180 tablet 0   Semaglutide,0.25 or 0.5MG/DOS, (OZEMPIC, 0.25 OR 0.5 MG/DOSE,) 2 MG/1.5ML SOPN Inject 0.5 mg into the skin once a week. 1 pen 0   spironolactone (ALDACTONE) 25 MG tablet Take 0.5 tablets (12.5 mg total) by mouth daily. 45 tablet 3   No current facility-administered medications on file prior to visit.     Mental Health History: Kerria {gblegal:22371} a history of therapeutic services. Alverda denied a history of hospitalizations for psychiatric concerns, and has never met with a psychiatrist.*** Monserrat stated she was *** psychotropic  medications. Jamira {gblegal:22371} a family history of mental health related concerns. *** Jamyra denied a  trauma history, including {gbtrauma:22071} abuse, as well as neglect. ***  Kathlen described her typical mood as ***. Aside from concerns noted above and endorsed on the PHQ-9 and GAD-7, Evonda reported ***. Aizza {gblegal:22371} current alcohol use. *** She {gblegal:22371} tobacco use. *** She {DGLOVFI:43329} illicit/recreational substance use. Regarding caffeine intake, Indi reported ***. Furthermore, Veda denied experiencing the following: {gbsxs:21965}. She also denied history of and current suicidal ideation, plan, and intent; history of and current homicidal ideation, plan, and intent; and history of and current engagement in self-harm.  The following strengths were reported by [facility]:*** The following strengths were observed by this provider: {gbstrengths:22223}.  Legal History: Lonni {gblegal:22371} a history of legal involvement.   Structured Assessment Results: The Patient Health Questionnaire-9 (PHQ-9) is a self-report measure that assesses symptoms and severity of depression over the course of the last two weeks. Jazelle obtained a score of *** suggesting {GBPHQ9SEVERITY:21752}. Dalyn finds the endorsed symptoms to be {gbphq9difficulty:21754}. Little interest or pleasure in doing things ***  Feeling down, depressed, or hopeless ***  Trouble falling or staying asleep, or sleeping too much ***  Feeling tired or having little energy ***  Poor appetite or overeating ***  Feeling bad about yourself --- or that you are a failure or have let yourself or your family down ***  Trouble concentrating on things, such as reading the newspaper or watching television ***  Moving or speaking so slowly that other people could have noticed? Or the opposite --- being so fidgety or restless that you have been moving around a lot more than usual ***  Thoughts that you would be better off dead or  hurting yourself in some way ***  PHQ-9 Score ***    The Generalized Anxiety Disorder-7 (GAD-7) is a brief self-report measure that assesses symptoms of anxiety over the course of the last two weeks. Yanett obtained a score of *** suggesting {gbgad7severity:21753}. Cherene finds the endorsed symptoms to be {gbphq9difficulty:21754}. Feeling nervous, anxious, on edge ***  Not being able to stop or control worrying ***  Worrying too much about different things ***  Trouble relaxing ***  Being so restless that it's hard to sit still ***  Becoming easily annoyed or irritable ***  Feeling afraid as if something awful might happen ***  GAD-7 Score ***   Interventions: A chart review was conducted prior to the clinical intake interview. The PHQ-9, and GAD-7 were verbally administered as well as a Mood and Food questionnaire to assess various behaviors related to emotional eating. Throughout session, empathic reflections and validation was provided. Continuing treatment with this provider was discussed and a treatment goal was established. Psychoeducation regarding emotional versus physical hunger was provided. Nate was sent a handout via e-mail to utilize between now and the next appointment to increase awareness of hunger patterns and subsequent eating. Rosaria provided verbal consent during today's appointment for this provider to send the handout via e-mail. ***  Provisional DSM-5 Diagnosis: {Diagnoses:22752}  Plan: Accalia appears able and willing to participate as evidenced by collaboration on a treatment goal, engagement in reciprocal conversation, and asking questions as needed for clarification. The next appointment will be scheduled in {gbweeks:21758}, which will be via [employer]. The following treatment goal was established: {gbtxgoals:21759}. Once this provider's office resumes in-person appointments and it is deemed appropriate, Trenda will be notified. For the aforementioned goal, Priscella can  benefit from biweekly individual therapy sessions that are brief in duration for approximately four to six sessions. The treatment modality will be  individual therapeutic services, including an eclectic therapeutic approach utilizing techniques from Cognitive Behavioral Therapy, Patient Centered Therapy, Dialectical Behavior Therapy, Acceptance and Commitment Therapy, Interpersonal Therapy, and Cognitive Restructuring. Therapeutic approach will include various interventions as appropriate, such as validation, support, mindfulness, thought defusion, reframing, psychoeducation, values assessment, and role playing. This provider will regularly review the treatment plan and medical chart to keep informed of status changes. Jalisia expressed understanding and agreement with the initial treatment plan of care.

## 2019-05-25 NOTE — Telephone Encounter (Signed)
  Office: [phone]  /  Fax: [phone]  Date of Call: May 25, 2019  Time of Call: 3:02pm; 3:10pm Provider: Glennie Isle, PsyD  CONTENT: This provider called Jessica Snow to check-in as she did not present for today's [name] appointment at 3:00pm. The voice message indicated the number is "temporarily not in service." Upon seeing the note in Epic, this provider called Jessica Snow work number at 3:10pm. A HIPAA compliant voicemail was left requesting a call back. Of note, this provider stayed on the [facility] appointment for 10 minutes prior to signing off.   PLAN: This provider will wait for Jessica Snow to call back. If deemed necessary, this provider or the provider's clinic will call Jessica Snow again in approximately one week.

## 2019-05-25 NOTE — Progress Notes (Signed)
Office: [phone]  /  Fax: [phone]    Date: May 27, 2019   Appointment Start Time: 4:04pm Duration: 35 minutes Provider: Glennie Isle, Psy.D. Type of Session: Intake for Individual Therapy  Location of Patient: Car in parking lot Location of Provider: [facility] Type of Contact: Telepsychological Visit via [employer]  Informed Consent: Prior to proceeding with today's appointment, two pieces of identifying information were obtained from [name] to verify identity. In addition, [name]'s physical location at the time of this appointment was obtained. [name] reported she was in the car of the parking lot near her son's employment and provided additional information regarding her location. In the event of technical difficulties, [name] shared a phone number she could be reached at. [name] and this provider participated in today's telepsychological service. Also, [name] denied anyone else being present in the car or on the [name] appointment.   The provider's role was explained to [name]. The provider reviewed and discussed issues of confidentiality, privacy, and limits therein (e.g., reporting obligations). In addition to verbal informed consent, written informed consent for psychological services was obtained from [name] prior to the initial intake interview. Written consent included information concerning the practice, financial arrangements, and confidentiality and patients' rights. Since the clinic is not a 24/7 crisis center, mental health emergency resources were shared, and the provider explained MyChart, e-mail, voicemail, and/or other messaging systems should be utilized only for non-emergency reasons. This provider also explained that information obtained during appointments will be placed in [name]'s medical record in a confidential manner and relevant information will be shared with other providers at [facility] that she meets with for  coordination of care. Jessica Snow verbally acknowledged understanding of the aforementioned, and agreed to use mental health emergency resources discussed if needed. Moreover, Jessica Snow agreed information may be shared with other [facility] providers as needed for coordination of care. By signing the service agreement document, Jessica Snow provided written consent for coordination of care.   Prior to initiating telepsychological services, Jessica Snow was provided with an informed consent document, which included the development of a safety plan (i.e., an emergency contact and emergency resources) in the event of an emergency/crisis. Jessica Snow expressed understanding of the rationale of the safety plan and provided consent for this provider to reach out to her emergency contact in the event of an emergency/crisis. Jessica Snow returned the completed consent form prior to today's appointment. This provider verbally reviewed the consent form during today's appointment prior to proceeding with the appointment. Jessica Snow verbally acknowledged understanding that she is ultimately responsible for understanding her insurance benefits as it relates to reimbursement of telepsychological and in-person services. This provider also reviewed confidentiality, as it relates to telepsychological services, as well as the rationale for telepsychological services. More specifically, this provider's clinic is limiting in-person visits due to COVID-19. Therapeutic services will resume to in-person appointments once deemed appropriate. Jessica Snow expressed understanding regarding the rationale for telepsychological services. In addition, this provider explained the telepsychological services informed consent document would be considered an addendum to the initial consent document/service agreement. Jessica Snow verbally consented to proceed.   Chief Complaint/HPI: Jessica Snow was referred by Dr. [name] due to depression with emotional eating behaviors. Per  the note for the initial visit with Dr. [name] on May 17, 2019, "[name] is struggling with emotional eating and using food for comfort to the extent that it is negatively impacting her health. She often snacks when she is not hungry. [name]  sometimes feels she is out of control and then feels guilty that she made poor food choices. She is attempting to work on behavior modification techniques to help reduce her emotional eating. She shows no sign of suicidal or homicidal ideations. PHQ-9 Score is 18." [name] further reported experiencing the following: frequently drinking liquids with calories, frequently making poor food choices, struggling with emotional eating, sometimes snacking at night and sometimes skipping breakfast.   During today's appointment, Jessica Snow was verbally administered a questionnaire assessing various behaviors related to emotional eating. Jessica Snow endorsed the following: experience food cravings on a regular basis, eat certain foods when you are anxious, stressed, depressed, or your feelings are hurt, use food to help you cope with emotional situations, find food is comforting to you, overeat when you are worried about something, overeat frequently when you are bored or lonely, not worry about what you eat when you are in a good mood and overeat when you are alone, but eat much less when you are with other people. She believes the onset of emotional eating was about 10 years ago. Jessica Snow recalled a long term relationship ended by her partner's choice during that time. Currently, she described the frequency of emotional eating as "once a week." She shared she craves the following: trail mix, pasta, candy, and cereal. She discussed eating larger portions, but denied engaging in binge eating. Jessica Snow denied a history of restricting food intake, purging and engagement in other compensatory strategies, and has never been diagnosed with an eating disorder. She also denied a history of treatment for  emotional eating. Moreover, Jessica Snow indicated stress triggers emotional eating. She is unsure what makes emotional eating better. Furthermore, Jessica Snow denied other problems of concern.    Mental Status Examination:  Appearance: neat Behavior: cooperative Mood: euthymic Affect: mood congruent Speech: normal in rate, volume, and tone Eye Contact: appropriate Psychomotor Activity: appropriate Thought Process: linear, logical, and goal directed  Content/Perceptual Disturbances: denies suicidal and homicidal ideation, plan, and intent and no hallucinations, delusions, bizarre thinking or behavior reported or observed Orientation: time, person, place and purpose of appointment Cognition/Sensorium: memory, attention, language, and fund of knowledge intact  Insight: fair Judgment: fair  Family & Psychosocial History: Jessica Snow reported she is single and she has two adult children. 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 Additionally, Vaneta shared her highest level of education obtained is a GED. Currently, Taja's social support system consists of her younger sister. Moreover, Jessica Snow stated she resides with her mother, children, and two grandchildren.   Medical History:  Past Medical History:  Diagnosis Date   Asthma    Chronic rhinitis    Cough    Edema, lower extremity    GERD (gastroesophageal reflux disease)    HTN (hypertension)    Morbid obesity (HCC)    Murmur    Pneumonia    Pre-diabetes    SOB (shortness of breath)    Past Surgical History:  Procedure Laterality Date   TOTAL VAGINAL HYSTERECTOMY  2007   Current Outpatient Medications on File Prior to Visit  Medication Sig Dispense Refill   albuterol (PROVENTIL) (2.5 MG/3ML) 0.083% nebulizer solution 1 vial in neb every 4 hours as needed     albuterol (VENTOLIN HFA) 108 (90 Base) MCG/ACT inhaler TAKE 2 PUFFS BY MOUTH EVERY 4 TO 6 HOURS AS NEEDED 8.5 Inhaler 11    benzonatate (TESSALON) 200 MG capsule TAKE 1 CAPSULE BY MOUTH EVERY DAY 3 TIMES A DAY AS  NEEDED FOR COUGH     budesonide-formoterol (SYMBICORT) 80-4.5 MCG/ACT inhaler Inhale 2 puffs into the lungs 2 (two) times daily. 1 Inhaler 11   famotidine (PEPCID) 20 MG tablet One at bedtime 30 tablet 11   furosemide (LASIX) 40 MG tablet Take 1 tablet (40 mg total) by mouth daily. 90 tablet 3   gabapentin (NEURONTIN) 100 MG capsule TAKE 1 CAPSULE (100 MG TOTAL) BY MOUTH 3 (THREE) TIMES DAILY. 90 capsule 0   montelukast (SINGULAIR) 10 MG tablet Take 1 tablet (10 mg total) by mouth at bedtime. 30 tablet 11   olmesartan (BENICAR) 40 MG tablet Take 1 tablet (40 mg total) by mouth daily. 90 tablet 1   pantoprazole (PROTONIX) 40 MG tablet Take 1 tablet by mouth daily 180 tablet 0   Semaglutide,0.25 or 0.'5MG'$ /DOS, (OZEMPIC, 0.25 OR 0.5 MG/DOSE,) 2 MG/1.5ML SOPN Inject 0.5 mg into the skin once a week. 1 pen 0   spironolactone (ALDACTONE) 25 MG tablet Take 0.5 tablets (12.5 mg total) by mouth daily. 45 tablet 3   No current facility-administered medications on file prior to visit.   Cordell denied a history of head injuries and loss of consciousness.    Mental Health History: Zohra denied a history of therapeutic services. Sharnice denied a history of hospitalizations for psychiatric concerns, and has never met with a psychiatrist. Akirah denied prescriptions for psychotropic medications. Chyan denied a family history of mental health related concerns. Akeyla denied a trauma history, including psychological, physical  and sexual abuse, as well as neglect.   Cayleigh described her typical mood as "stable, content." Aside from concerns noted above and endorsed on the PHQ-9 and GAD-7, Tanda reported experiencing decreased motivation and worry thoughts about employment, weight, health, and finding her own place to live. Jessica Snow denied current alcohol use. She denied tobacco use. She denied illicit/recreational  substance use. Regarding caffeine intake, Jessica Snow reported she consumes a cup of coffee daily. Furthermore, Jessica Snow denied experiencing the following: hopelessness, hallucinations and delusions, paranoia and mania. She also denied history of and current suicidal ideation, plan, and intent; history of and current homicidal ideation, plan, and intent; and history of and current engagement in self-harm.  The following strengths were reported by [place]: people person, resilient, and good grandmother.The following strengths were observed by this provider: ability to express thoughts and feelings during the therapeutic session, ability to establish and benefit from a therapeutic relationship, ability to learn and practice coping skills, willingness to work toward established goal(s) with the clinic and ability to engage in reciprocal conversation.  Legal History: Misha denied a history of legal involvement.   Structured Assessment Results: The Patient Health Questionnaire-9 (PHQ-9) is a self-report measure that assesses symptoms and severity of depression over the course of the last two weeks. Jessica Snow obtained a score of 14 suggesting moderate depression. Jessica Snow finds the endorsed symptoms to be somewhat difficult. Little interest or pleasure in doing things 3  Feeling down, depressed, or hopeless 1  Trouble falling or staying asleep, or sleeping too much 3  Feeling tired or having little energy 3  Poor appetite or overeating 1  Feeling bad about yourself --- or that you are a failure or have let yourself or your family down 1  Trouble concentrating on things, such as reading the newspaper or watching television 1  Moving or speaking so slowly that other people could have noticed? Or the opposite --- being so fidgety or restless that you have been moving around a lot more than usual  1  Thoughts that you would be better off dead or hurting yourself in some way 0  PHQ-9 Score 14    The Generalized Anxiety  Disorder-7 (GAD-7) is a brief self-report measure that assesses symptoms of anxiety over the course of the last two weeks. Jessica Snow obtained a score of 13 suggesting moderate anxiety. Jessica Snow finds the endorsed symptoms to be somewhat difficult. Feeling nervous, anxious, on edge 0  Not being able to stop or control worrying 3  Worrying too much about different things 3  Trouble relaxing 3  Being so restless that it's hard to sit still 1  Becoming easily annoyed or irritable 3  Feeling afraid as if something awful might happen 0  GAD-7 Score 13   Interventions: A chart review was conducted prior to the clinical intake interview. The PHQ-9, and GAD-7 were verbally administered as well as a Mood and Food questionnaire to assess various behaviors related to emotional eating. Throughout session, empathic reflections and validation was provided. This provider recommended longer-term therapeutic services due to ongoing stressors and discussed options to establish care with a new provider, including Jessica Snow contacting her insurance company for a list of [employer], exploring [email or web address], or this provider placing a referral. Jessica Snow provided verbal consent for this provider to place a referral to address ongoing stressors and emotional eating. She was receptive to meeting with this provider until established with a new provider; therefore, continuing treatment with this provider was discussed and a treatment goal was established. Psychoeducation regarding emotional versus physical hunger was provided. Jessica Snow was sent a handout via e-mail to utilize between now and the next appointment to increase awareness of hunger patterns and subsequent eating. Tanesia provided verbal consent during today's appointment for this provider to send the handout via e-mail.   Provisional DSM-5 Diagnosis: 311 (F32.8) Other Specified Depressive Disorder, Emotional Eating Behaviors  Plan: Vail appears able and willing  to participate as evidenced by collaboration on a treatment goal, engagement in reciprocal conversation, and asking questions as needed for clarification. The next appointment will be scheduled in two weeks, which will be via [employer]. The following treatment goal was established: decrease emotional eating. Once this provider's office resumes in-person appointments and it is deemed appropriate, Arkie will be notified. For the aforementioned goal, Cleatus can benefit from biweekly individual therapy sessions that are brief in duration for approximately four to six sessions. The treatment modality will be individual therapeutic services, including an eclectic therapeutic approach utilizing techniques from Cognitive Behavioral Therapy, Patient Centered Therapy, Dialectical Behavior Therapy, Acceptance and Commitment Therapy, Interpersonal Therapy, and Cognitive Restructuring. Therapeutic approach will include various interventions as appropriate, such as validation, support, mindfulness, thought defusion, reframing, psychoeducation, values assessment, and role playing. This provider will regularly review the treatment plan and medical chart to keep informed of status changes. Makenleigh expressed understanding and agreement with the initial treatment plan of care. In addition, a referral will be placed.

## 2019-05-27 ENCOUNTER — Ambulatory Visit (INDEPENDENT_AMBULATORY_CARE_PROVIDER_SITE_OTHER): Payer: BC Managed Care – PPO | Admitting: Psychology

## 2019-05-27 ENCOUNTER — Other Ambulatory Visit: Payer: Self-pay

## 2019-05-27 DIAGNOSIS — F3289 Other specified depressive episodes: Secondary | ICD-10-CM | POA: Diagnosis not present

## 2019-05-31 ENCOUNTER — Ambulatory Visit (INDEPENDENT_AMBULATORY_CARE_PROVIDER_SITE_OTHER): Payer: BC Managed Care – PPO | Admitting: Bariatrics

## 2019-05-31 ENCOUNTER — Encounter (INDEPENDENT_AMBULATORY_CARE_PROVIDER_SITE_OTHER): Payer: Self-pay | Admitting: Bariatrics

## 2019-05-31 ENCOUNTER — Other Ambulatory Visit: Payer: Self-pay

## 2019-05-31 VITALS — BP 135/85 | HR 79 | Temp 98.0°F | Ht 64.0 in | Wt 264.0 lb

## 2019-05-31 DIAGNOSIS — E119 Type 2 diabetes mellitus without complications: Secondary | ICD-10-CM

## 2019-05-31 DIAGNOSIS — Z9189 Other specified personal risk factors, not elsewhere classified: Secondary | ICD-10-CM

## 2019-05-31 DIAGNOSIS — E559 Vitamin D deficiency, unspecified: Secondary | ICD-10-CM

## 2019-05-31 DIAGNOSIS — Z6841 Body Mass Index (BMI) 40.0 and over, adult: Secondary | ICD-10-CM

## 2019-05-31 MED ORDER — VITAMIN D (ERGOCALCIFEROL) 1.25 MG (50000 UNIT) PO CAPS: 50000.0000 [IU] | ORAL_CAPSULE | ORAL | 0 refills | Status: DC

## 2019-06-01 ENCOUNTER — Encounter: Payer: Self-pay | Admitting: Internal Medicine

## 2019-06-01 ENCOUNTER — Ambulatory Visit (INDEPENDENT_AMBULATORY_CARE_PROVIDER_SITE_OTHER): Payer: BC Managed Care – PPO | Admitting: Internal Medicine

## 2019-06-01 DIAGNOSIS — J310 Chronic rhinitis: Secondary | ICD-10-CM | POA: Diagnosis not present

## 2019-06-01 DIAGNOSIS — J45991 Cough variant asthma: Secondary | ICD-10-CM

## 2019-06-01 MED ORDER — BUDESONIDE-FORMOTEROL FUMARATE 80-4.5 MCG/ACT IN AERO: 2.0000 | INHALATION_SPRAY | Freq: Two times a day (BID) | RESPIRATORY_TRACT | 11 refills | Status: DC

## 2019-06-01 MED ORDER — AMOXICILLIN-POT CLAVULANATE 875-125 MG PO TABS: 1.0000 | ORAL_TABLET | Freq: Two times a day (BID) | ORAL | 0 refills | Status: AC

## 2019-06-01 NOTE — Assessment & Plan Note (Signed)
Acute flare with R max sinus/ ear pain with nl ear exam so likely referred    rec  Augmentin 875 mg take one pill twice daily  X 10 days - then ent eval prn

## 2019-06-01 NOTE — Assessment & Plan Note (Signed)
Quit smoking 01/2018 04/02/2018    symb 80 2bid - 04/02/2018 try off acei.  - PFT's  05/15/2018  FEV1 1.39 (62 % ) ratio 82     p albuterol per neb w/in 4 h  prior to study with DLCO  85 % corrects to 142  % for alv volume  And ERV 29% and very minimal curvature and active "wheeze" on exam during study  - Allergy profile 05/15/18  >  Eos 0.9/  IgE  362 RAST neg  - FENO 06/01/2018  =   74  - Spirometry 06/01/2018  FEV1 0.65 (29%)  Ratio 73 with mild curvature - 90/24/09 complicated by confirmed L rib fx  - added gabapentin 100 tid 09/14/2018  - 06/01/2019  After extensive coaching inhaler device,  effectiveness =    90%   Prior to present flare:  All goals of chronic asthma control met including optimal function and elimination of symptoms with minimal need for rescue therapy.  Contingencies discussed in full including contacting this office immediately if not controlling the symptoms using the rule of two's.

## 2019-06-01 NOTE — Progress Notes (Signed)
Subjective:     Patient ID: Jessica Snow, female   DOB: 21-Jul-1966,     MRN: [id number]  Brief patient profile:  52 yobf quit smoking 01/2018 healthy as child/ teenager ran track in HS very competitive no problem with IUP 53 yo with baseline wt 140 with progressive wt gain since her late 30's   complicated HBP on  lisinopril then recurrent bronchtis since age 53 which = 2012 referred to pulmonary clinic 04/02/2018 by Dr   Jaci Standardobin Sanders's PA with cough and orthopnea.    History of Present Illness  04/02/2018 1st [facility] office visit/ Jessica Snow  Re cough on ACEi  - not right since 2012  Chief Complaint  Patient presents with  . Pulmonary Consult    Referred by Beatrix FettersSylvia Southworth, PA.  Pt c/o SOB for the past month.  She states she sometimes gets winded just walking short distances.  She occ has SOB when she lies down. She also c/o cough with clear sputum- worse at night and wakes her up.  She uses her albuterol inhaler and neb both 1-2 x daily on average.   cough wax and wanes x 6 months 24/7 with freq throat clearing and noct exacerbation  symbicort 80 no better  pred no better  was able to treadmill x two years prior to OV  Now doe x more than room to room and immediate in supine position  rec Stop lisinopril and start benicar 40-25 one daily and your cough should improve Continue Prevacid 30 mg  Take 30-60 min before first meal of the day and add add pepcid 20 mg one hour before bedtime  GERD diet   Please schedule a follow up office visit in 6 weeks, call sooner if needed with all medications /inhalers/ solutions in hand so we can verify exactly what you are taking. This includes all medications from all doctors and over the counters and PFT's on return    Add: have her come back in 1 week for bp check and bmet to see Demetrius CharityBrian or Sarah NP     05/15/2018  f/u ov/Jessica Snow re: uacs/ brought most of her meds/ no prevacid  Chief Complaint  Patient presents with  . Follow-up    PFT today,  shortness of breath, productive cough (yellow), frequent headaches  Dyspnea:  MMRC3 = can't walk 100 yards even at a slow pace at a flat grade s stopping due to sob   Cough: 24/7  Min yellow mucus Sleeping: R side down/ 4-5 pillows SABA use: neb and saba  rec Stop symbicort and hfa albuterol for now  For breathing >>> Keep using the nebulizer up to every 4 hours if needed For coughing >>> Take delsym two tsp every 12 hours and supplement if needed with  tramadol 50 mg up to 2 every 4 hours to suppress the urge to cough  - stopped p 3 days    Stop prevacid and start protonix (pantoprazole) 40 mg  Take 30- 60 min before your first and last meals of the day  For drainage / throat tickle stop allegra and try  CHLORPHENIRAMINE  4 mg - take one every 4 hours as needed - available over the counter- may cause drowsiness so start with just a bedtime dose or two and see how you tolerate it before trying in daytime  - took one each am  Prednisone 10 mg take  4 each am x 2 days,   2 each am x 2 days,  1 each am x 2 days and stop  Please schedule a follow up office visit in 2  weeks, sooner if needed  with all medications /inhalers/ solutions in hand so we can verify exactly what you are taking. This includes all medications from all doctors and over the counters- did not bring all meds      06/01/2018 acute extended ov/Jessica Snow re:  Chief Complaint  Patient presents with  . Acute Visit    wheezing, increased SOB, chest soreness and cough with white to yellow sputum x 5 days.   see above med errors.  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 rec  Restart singulair 10 mg daily and Symbicort 80 Take 2 puffs first thing in am and then another 2 puffs about 12 hours later.  Work on inhaler technique:  relax and gently blow  all the way out then take a nice smooth deep breath back in, triggering the inhaler at same time you start breathing in.  Hold for up to 5 seconds if you can. Blow out thru nose. Rinse and gargle with water when done For breathing >>> Keep using the nebulizer up to every 4 hours if needed For coughing >>> Take delsym two tsp every 12 hours and supplement if needed with  tramadol 50 mg up to 2 every 4 hours to suppress the urge to cough. Swallowing water and/or using ice chips/non mint and menthol containing candies (such as lifesavers or sugarless jolly ranchers) are also effective.  You should rest your voice and avoid activities that you know make you cough. Once you have eliminated the cough for 3 straight days try reducing the tramadol first,  then the delsym as tolerated.   Stop prevacid and start protonix (pantoprazole) 40 mg  Take 30- 60 min before your first and last meals of the day  For drainage / throat tickle stop allegra and try  CHLORPHENIRAMINE  4 mg - take one every 4 hours as needed - available over the counter- may cause drowsiness so start with just a bedtime dose or two and see how you tolerate it before trying in daytime   Prednisone 10 mg take  4 each am x 2 days,   2 each am x 2 days,  1 each am x 2 days and stop  See Tammy NP w/in 2 weeks with all your medications> med calendar   Cough better then relapsed mid Oct >  Rib fx 10/22 and referred back to pulmonary by PCP   09/14/2018  f/u ov/Jessica Snow re:  uacs complicated by L Rib fx no med calendar/ prob component of cough variant asthma but hfa quite poor  Chief Complaint  Patient presents with  . Follow-up    Breathing is overall doing well today. She is still coughing some- mainly non prod. She is using her proair inhaler 2 x per wk on average and she uses her neb 2 x daily on average.   Dyspnea:  Slow pace, [employer], no bigger store/ no HC parking / no mall walking - would like to start doing [employer]  Cough:  To point  of rib fx  - 24/7  - was better until end of October 2019  Sleeping: sitting up due to rib pain  SABA use: excess  as above  02: none  Cp better from rib fx but made worse by incessant cough  rec Weight control is simply a matter of calorie balance   For drainage / throat tickle try take CHLORPHENIRAMINE  4 mg - take one every 4 hours as needed - available over the counter- may cause drowsiness so start with just a bedtime  X  two and see how you tolerate it before trying in daytime up to every 4 hours if needed   Gabapentin 100 mg three times a day If still can't stop coughing Tessalon 200 mg three times and if still coughing > tramadol 50 mg up to 2 every 4 hours  Please schedule a follow up office visit in 2 weeks, call sooner if needed with all medications /inhalers/ solution   06/01/2019 acute ext ov  ov/Jessica Snow re:  symbicort 80 2 bid maint and doing much better until acute flare x one week prior to OV   Chief Complaint  Patient presents with  . Acute Visit    dry cough and wheezing x 1 wk  Dyspnea:  Worse x one week assoc with new cough/ wheeze better p saba Cough: dry mostly hs Sleeping:  Rotated on L side, 3-4 pillows  SABA use: started using saba hfa/ neb w/in last week  02: none    No obvious day to day or daytime variability or assoc excess/ purulent sputum or mucus plugs or hemoptysis or cp or chest tightness, subjective wheeze or overt   hb symptoms.   Sleeping ok as above  without nocturnal  or early am exacerbation  of respiratory  c/o's or need for noct saba. Also denies any obvious fluctuation of symptoms with weather or environmental changes or other aggravating or alleviating factors except as outlined above   No unusual exposure hx or h/o childhood pna/ asthma or knowledge of premature birth.  Current Allergies, Complete Past Medical History, Past Surgical History, Family History, and Social History were reviewed in [facility] electronic medical record.  ROS  The  following are not active complaints unless bolded Hoarseness, sore throat, dysphagia, dental problems, itching, sneezing,  nasal congestion or discharge of excess mucus or purulent secretions, ear ache R,   fever, chills, sweats, unintended wt loss or wt gain, classically pleuritic or exertional cp,  orthopnea pnd or arm/hand swelling  or leg swelling, presyncope, palpitations, abdominal pain, anorexia, nausea, vomiting, diarrhea  or change in bowel habits or change in bladder habits, change in stools or change in urine, dysuria, hematuria,  rash, arthralgias, visual complaints, headache, numbness, weakness or ataxia or problems with walking or coordination,  change in mood or  memory.        Current Meds  Medication Sig  . albuterol (PROVENTIL) (2.5 MG/3ML) 0.083% nebulizer solution 1 vial in neb every 4 hours as needed  . albuterol (VENTOLIN HFA) 108 (90 Base) MCG/ACT inhaler TAKE 2 PUFFS BY MOUTH EVERY 4 TO 6 HOURS AS NEEDED  . budesonide-formoterol (SYMBICORT) 80-4.5 MCG/ACT inhaler Inhale 2 puffs into the lungs 2 (two) times daily.  . famotidine (PEPCID) 20 MG tablet One at bedtime  . furosemide (LASIX) 40 MG tablet Take 1 tablet (40 mg total) by mouth daily.  Marland Kitchen. gabapentin (NEURONTIN) 100 MG capsule TAKE 1 CAPSULE (100 MG TOTAL) BY MOUTH 3 (THREE) TIMES DAILY.  Marland Kitchen. Liraglutide -Weight Management (SAXENDA) 18 MG/3ML SOPN Inject 0.6 mg into the skin daily.  . montelukast (SINGULAIR) 10 MG tablet Take 1 tablet (10 mg  total) by mouth at bedtime.  Marland Kitchen. olmesartan (BENICAR) 40 MG tablet Take 40 mg by mouth daily.  . pantoprazole (PROTONIX) 40 MG tablet Take 1 tablet by mouth daily  . spironolactone (ALDACTONE) 25 MG tablet Take 0.5 tablets (12.5 mg total) by mouth daily.  . Vitamin D, Ergocalciferol, (DRISDOL) 1.25 MG (50000 UT) CAPS capsule Take 1 capsule (50,000 Units total) by mouth every 7 (seven) days.                    Objective:   Physical Exam  Obese bf nad    06/01/2019         264   09/14/2018     261  06/01/2018         252  05/15/2018       260   04/02/18 256 lb (116.1 kg)  10/08/13 240 lb (108.9 kg)  01/20/12 231 lb (104.8 kg)     Vital signs reviewed - Note on arrival 02 sats  99% on RA      HEENT: nl dentition, turbinates bilaterally, and oropharynx. Nl external ear canals without cough reflex   NECK :  without JVD/Nodes/TM/ nl carotid upstrokes bilaterally   LUNGS: no acc muscle use,  Nl contour chest which is clear to A and P bilaterally without cough on insp or exp maneuvers   CV:  RRR  no s3 or murmur or increase in P2, and no edema   ABD:  soft and nontender with nl inspiratory excursion in the supine position. No bruits or organomegaly appreciated, bowel sounds nl  MS:  Nl gait/ ext warm without deformities, calf tenderness, cyanosis or clubbing No obvious joint restrictions   SKIN: warm and dry without lesions    NEURO:  alert, approp, nl sensorium with  no motor or cerebellar deficits apparent.            Assessment:

## 2019-06-01 NOTE — Assessment & Plan Note (Signed)
Body mass index is 46.77 kg/m.  -  trending  Up  Lab Results  Component Value Date   TSH 1.030 04/21/2019     Contributing to gerd risk/ doe/reviewed the need and the process to achieve and maintain neg calorie balance > defer f/u primary care including intermittently monitoring thyroid status   I had an extended discussion with the patient reviewing all relevant studies completed to date and  lasting 15 to 20 minutes of a 25 minute visit    I performed detailed device teaching using a teach back method which extended face to face time for this visit (see above)  Each maintenance medication was reviewed in detail including emphasizing most importantly the difference between maintenance and prns and under what circumstances the prns are to be triggered using an action plan format that is not reflected in the computer generated alphabetically organized AVS which I have not found useful in most complex patients, especially with respiratory illnesses  Please see AVS for specific instructions unique to this visit that I personally wrote and verbalized to the the pt in detail and then reviewed with pt  by my nurse highlighting any  changes in therapy recommended at today's visit to their plan of care.

## 2019-06-01 NOTE — Patient Instructions (Addendum)
Augmentin 875 mg take one pill twice daily  X 10 days - take at breakfast and supper with large glass of water.  It would help reduce the usual side effects (diarrhea and yeast infections) if you ate cultured yogurt at lunch.   If not better call for ent evaluation   Continue symbicort 80 Take 2 puffs first thing in am and then another 2 puffs about 12 hours later.     Work on inhaler technique:  relax and gently blow all the way out then take a nice smooth deep breath back in, triggering the inhaler at same time you start breathing in.  Hold for up to 5 seconds if you can. Blow out thru nose. Rinse and gargle with water when done   Please schedule a follow up visit in 12  months but call sooner if needed

## 2019-06-01 NOTE — Progress Notes (Signed)
Office: [phone]  /  Fax: [phone]   HPI:   Chief Complaint: OBESITY Jessica Snow is here to discuss her progress with her obesity treatment plan. She is on the Category 3 plan and is following her eating plan approximately 70 % of the time. She states she is walking 10 minutes 5 times per week. Jessica Snow has gained three pounds. She struggled with breakfast and lunch. Jessica Snow will continue the Saxenda (0.6 mg) daily per her PCP. Her weight is 264 lb (119.7 kg) today and has had a weight gain of 3 pounds over a period of 2 weeks since her last visit. She has gained 3 lbs since starting treatment with [place].  Vitamin D deficiency Jessica Snow has a diagnosis of vitamin D deficiency. Her last vitamin D level was at 23.1 Jessica Snow is not currently taking vit D and she denies nausea, vomiting or muscle weakness.  At risk for osteopenia and osteoporosis Jessica Snow is at higher risk of osteopenia and osteoporosis due to vitamin D deficiency.   Diabetes II Jessica Snow has a diagnosis of diabetes type II. Jessica Snow denies any hypoglycemic episodes. Last A1c was at 6.2, which is down from 6.5 She has been working on intensive lifestyle modifications including diet, exercise, and weight loss to help control her blood glucose levels.  ASSESSMENT AND PLAN:  Vitamin D deficiency - Plan: Vitamin D, Ergocalciferol, (DRISDOL) 1.25 MG (50000 UT) CAPS capsule  Type 2 diabetes mellitus without complication, without long-term current use of insulin (HCC)  At risk for osteoporosis  Class 3 severe obesity with serious comorbidity and body mass index (BMI) of 45.0 to 49.9 in adult, unspecified obesity type ([place])  PLAN:  Vitamin D Deficiency Jessica Snow was informed that low vitamin D levels contributes to fatigue and are associated with obesity, breast, and colon cancer. She agrees to continue to take prescription Vit D @50 ,000 IU every week #4 with no refills and will follow up for routine testing of vitamin D, at least 2-3 times  per year. She was informed of the risk of over-replacement of vitamin D and agrees to not increase her dose unless she discusses this with [place] first. Jessica Snow agrees to follow up with our clinic in 2 weeks.  At risk for osteopenia and osteoporosis Jessica Snow was given extended  (15 minutes) osteoporosis prevention counseling today. Jessica Snow is at risk for osteopenia and osteoporosis due to her vitamin D deficiency. She was encouraged to take her vitamin D and follow her higher calcium diet and increase strengthening exercise to help strengthen her bones and decrease her risk of osteopenia and osteoporosis.  Diabetes II Jessica Snow has been given extensive diabetes education by myself today including ideal fasting and post-prandial blood glucose readings, individual ideal Hgb A1c goals and hypoglycemia prevention. We discussed the importance of good blood sugar control to decrease the likelihood of diabetic complications such as nephropathy, neuropathy, limb loss, blindness, coronary artery disease, and death. We discussed the importance of intensive lifestyle modification including diet, exercise and weight loss as the first line treatment for diabetes. Jessica Snow will continue to work on decreasing simple carbohydrates and increasing lean protein in her diet. Hypoglycemia, low blood sugar sheet was given to patient. Jessica Snow agrees to follow up at the agreed upon time.  Obesity Jessica Snow is currently in the action stage of change. As such, her goal is to continue with weight loss efforts She has agreed to follow the Category 3 plan Jessica Snow has been instructed to work up to a goal of 150 minutes of  combined cardio and strengthening exercise per week for weight loss and overall health benefits. We discussed the following Behavioral Modification Strategies today: increase H2O intake, no skipping meals, keeping healthy foods in the home, increasing lean protein intake, decreasing simple carbohydrates, increasing vegetables,  decrease eating out and work on meal planning and intentional eating Jessica Snow will boil eggs in the morning and she will pack her lunch.  Jessica Snow has agreed to follow up with our clinic in 2 weeks. She was informed of the importance of frequent follow up visits to maximize her success with intensive lifestyle modifications for her multiple health conditions.  ALLERGIES: Allergies  Allergen Reactions   Honey Bee Treatment [Bee Venom]    Wheat Bran     MEDICATIONS: Current Outpatient Medications on File Prior to Visit  Medication Sig Dispense Refill   albuterol (PROVENTIL) (2.5 MG/3ML) 0.083% nebulizer solution 1 vial in neb every 4 hours as needed     albuterol (VENTOLIN HFA) 108 (90 Base) MCG/ACT inhaler TAKE 2 PUFFS BY MOUTH EVERY 4 TO 6 HOURS AS NEEDED 8.5 Inhaler 11   benzonatate (TESSALON) 200 MG capsule TAKE 1 CAPSULE BY MOUTH EVERY DAY 3 TIMES A DAY AS NEEDED FOR COUGH     budesonide-formoterol (SYMBICORT) 80-4.5 MCG/ACT inhaler Inhale 2 puffs into the lungs 2 (two) times daily. 1 Inhaler 11   famotidine (PEPCID) 20 MG tablet One at bedtime 30 tablet 11   furosemide (LASIX) 40 MG tablet Take 1 tablet (40 mg total) by mouth daily. 90 tablet 3   gabapentin (NEURONTIN) 100 MG capsule TAKE 1 CAPSULE (100 MG TOTAL) BY MOUTH 3 (THREE) TIMES DAILY. 90 capsule 0   montelukast (SINGULAIR) 10 MG tablet Take 1 tablet (10 mg total) by mouth at bedtime. 30 tablet 11   olmesartan (BENICAR) 40 MG tablet Take 1 tablet (40 mg total) by mouth daily. 90 tablet 1   pantoprazole (PROTONIX) 40 MG tablet Take 1 tablet by mouth daily 180 tablet 0   spironolactone (ALDACTONE) 25 MG tablet Take 0.5 tablets (12.5 mg total) by mouth daily. 45 tablet 3   No current facility-administered medications on file prior to visit.     PAST MEDICAL HISTORY: Past Medical History:  Diagnosis Date   Asthma    Chronic rhinitis    Cough    Edema, lower extremity    GERD (gastroesophageal reflux  disease)    HTN (hypertension)    Morbid obesity (HCC)    Murmur    Pneumonia    Pre-diabetes    SOB (shortness of breath)     PAST SURGICAL HISTORY: Past Surgical History:  Procedure Laterality Date   TOTAL VAGINAL HYSTERECTOMY  2007    SOCIAL HISTORY: Social History   Tobacco Use   Smoking status: Former Smoker    Packs/day: 0.25    Years: 10.00    Pack years: 2.50    Types: Cigarettes    Quit date: 01/26/2018    Years since quitting: 1.3   Smokeless tobacco: Former NeurosurgeonUser  Substance Use Topics   Alcohol use: No   Drug use: No    FAMILY HISTORY: Family History  Problem Relation Age of Onset   Hypertension Mother    Diabetes Mother    Heart disease Mother    Sleep apnea Mother    Obesity Mother    Hypertension Father    Anemia Father     ROS: Review of Systems  Constitutional: Negative for weight loss.  Gastrointestinal: Negative for nausea and vomiting.  Musculoskeletal:       Negative for muscle weakness  Endo/Heme/Allergies:       Negative for hypoglycemia    PHYSICAL EXAM: Blood pressure 135/85, pulse 79, temperature 98 F (36.7 C), temperature source Oral, height 5\' 4"  (1.626 m), weight 264 lb (119.7 kg), last menstrual period 12/19/2005, SpO2 96 %. Body mass index is 45.32 kg/m. Physical Exam Vitals signs reviewed.  Constitutional:      Appearance: Normal appearance. She is well-developed. She is obese.  Cardiovascular:     Rate and Rhythm: Normal rate.  Pulmonary:     Effort: Pulmonary effort is normal.  Musculoskeletal: Normal range of motion.  Skin:    General: Skin is warm and dry.  Neurological:     Mental Status: She is alert and oriented to person, place, and time.  Psychiatric:        Mood and Affect: Mood normal.        Behavior: Behavior normal.     RECENT LABS AND TESTS: BMET    Component Value Date/Time   NA 143 04/21/2019 1410   K 4.2 04/21/2019 1410   CL 104 04/21/2019 1410   CO2 24 04/21/2019 1410    GLUCOSE 65 04/21/2019 1410   GLUCOSE 97 08/31/2018 1149   BUN 13 04/21/2019 1410   CREATININE 0.75 04/21/2019 1410   CREATININE 0.66 12/20/2011 1710   CALCIUM 9.8 04/21/2019 1410   GFRNONAA 93 04/21/2019 1410   GFRAA 107 04/21/2019 1410   Lab Results  Component Value Date   HGBA1C 6.2 (H) 05/17/2019   HGBA1C 6.5 (H) 03/17/2019   HGBA1C 6.5 (H) 09/29/2018   No results found for: INSULIN CBC    Component Value Date/Time   WBC 7.4 10/09/2018 1138   WBC 7.3 05/15/2018 1113   RBC 4.86 10/09/2018 1138   RBC 4.95 05/15/2018 1113   HGB 12.4 10/09/2018 1138   HCT 38.3 10/09/2018 1138   PLT 253 10/09/2018 1138   MCV 79 10/09/2018 1138   MCH 25.5 (L) 10/09/2018 1138   MCH 26.6 10/08/2013 0953   MCHC 32.4 10/09/2018 1138   MCHC 32.0 05/15/2018 1113   RDW 15.8 (H) 10/09/2018 1138   LYMPHSABS 2.6 08/18/2018 1019   MONOABS 0.5 05/15/2018 1113   EOSABS 0.4 08/18/2018 1019   BASOSABS 0.0 08/18/2018 1019   Iron/TIBC/Ferritin/ %Sat No results found for: IRON, TIBC, FERRITIN, IRONPCTSAT Lipid Panel     Component Value Date/Time   CHOL 154 05/17/2019 1402   TRIG 65 05/17/2019 1402   HDL 55 05/17/2019 1402   LDLCALC 86 05/17/2019 1402   Hepatic Function Panel     Component Value Date/Time   PROT 7.4 04/21/2019 1410   ALBUMIN 4.3 04/21/2019 1410   AST 23 04/21/2019 1410   ALT 28 04/21/2019 1410   ALKPHOS 89 04/21/2019 1410   BILITOT 0.5 04/21/2019 1410   BILIDIR 0.14 04/21/2019 1410      Component Value Date/Time   TSH 1.030 04/21/2019 1410   TSH 1.090 03/17/2019 1516     Ref. Range 05/17/2019 14:02  Vitamin D, 25-Hydroxy Latest Ref Range: 30.0 - 100.0 ng/mL 23.1 (L)    OBESITY BEHAVIORAL INTERVENTION VISIT  Today's visit was # 2   Starting weight: 261 lbs Starting date: 05/17/2019 Today's weight : 264 lbs Today's date: 05/31/2019 Total lbs lost to date: 0    05/31/2019  Height 5\' 4"  (1.626 m)  Weight 264 lb (119.7 kg)  BMI (Calculated) 45.29  BLOOD PRESSURE -  SYSTOLIC 135  BLOOD PRESSURE - DIASTOLIC 85   Body Fat % 51.6 %  Total Body Water (lbs) 96.6 lbs    ASK: We discussed the diagnosis of obesity with Micheline Roughegina D Lodes today and Jessica Snow agreed to give us permission to discuss obesity behavioral modification therapy today.  ASSESS: Jessica Snow has the diagnosis of obesity and her BMI today is 945.29 Jessica Snow is in the action stage of change   ADVISE: Jessica Snow was educated on the multiple health risks of obesity as well as the benefit of weight loss to improve her health. She was advised of the need for long term treatment and the importance of lifestyle modifications to improve her current health and to decrease her risk of future health problems.  AGREE: Multiple dietary modification options and treatment options were discussed and  Jessica Snow agreed to follow the recommendations documented in the above note.  ARRANGE: Jessica Snow was educated on the importance of frequent visits to treat obesity as outlined per CMS and USPSTF guidelines and agreed to schedule her next follow up appointment today.  Cristi LoronI, Joanne Murray, am acting as Energy managertranscriptionist for [employer] [place] Corporation[employer]el A. Manson PasseyBrown, DO

## 2019-06-09 NOTE — Progress Notes (Signed)
Office: [phone]  /  Fax: [phone]    Date: June 14, 2019   Appointment Start Time:4:07am Duration: 31 minutes Provider: Lawerance CruelGaytri , Psy.D. Type of Session: Individual Therapy  Location of Patient: Home Location of Provider: Provider's Home Type of Contact: Telepsychological Visit via [name]   Session Content: Jessica Snow is a 53 y.o. female presenting via [employer] [name] for a follow-up appointment to address the previously established treatment goal of decreasing emotional eating. Of note, this provider called Jessica Snow at 4:02pm as she did not present for today's appointment. She forgot about the appointment, but noted she could join. As such, the e-mail with the secure link was re-sent. Today's appointment was initiated 7 minutes late. Today's appointment was a telepsychological visit, as this provider's clinic is seeing a limited number of patients for in-person visits due to COVID-19. Therapeutic services will resume to in-person appointments once deemed appropriate. Jessica Snow expressed understanding regarding the rationale for telepsychological services, and provided verbal consent for today's appointment. Prior to proceeding with today's appointment, Jessica Snow's physical location at the time of this appointment was obtained. Jessica Snow reported she was at home and provided the address. In the event of technical difficulties, Jessica Snow shared a phone number she could be reached at. Jessica Snow and this provider participated in today's telepsychological service. Also, Jessica Snow denied anyone else being present in the room or on the [name] appointment.  This provider conducted a brief check-in and verbally administered the PHQ-9 and GAD-7. Jessica Snow stated "nothing" has changed from the last appointment, but noted ongoing stressors and not having anyone to talk to. She shared she had to cancel her appointment with Dr. Manson PasseyBrown due to finances. Regarding eating, Jessica Snow stated, "Some days are good days." She described  experiencing cravings on the weekends and acknowledged episodes of emotional eating. Additionally, she believes "most" of her eating is secondary to emotional hunger. This provider discussed longer-term therapeutic services due to ongoing stressors and limited social support. Due to miscommunication, a referral was placed again for longer-term therapeutic services with Jessica Snow's verbal consent. She then explained she canceled her appointment per the referral placed during the last appointment due to paperwork and she was receptive to calling back to reschedule. Moreover, pychoeducation regarding triggers for emotional eating was provided. Jessica Snow was provided a handout, and encouraged to utilize the handout between now and the next appointment to increase awareness of triggers and frequency. Jessica Snow agreed. This provider also discussed behavioral strategies for specific triggers, such as placing the utensil down when conversing to avoid mindless eating. Jessica Snow provided verbal consent during today's appointment for this provider to send the handout about triggers via e-mail. Overall, Jessica Snow was receptive to today's session as evidenced by openness to sharing, responsiveness to feedback, and willingness to explore triggers for emotional eating.  Mental Status Examination:  Appearance: neat Behavior: cooperative Mood: euthymic Affect: mood congruent Speech: normal in rate, volume, and tone Eye Contact: appropriate Psychomotor Activity: appropriate Thought Process: linear, logical, and goal directed  Content/Perceptual Disturbances: no hallucinations, delusions, bizarre thinking or behavior reported or observed and no evidence of suicidal and homicidal ideation, plan, and intent Orientation: time, person, place and purpose of appointment Cognition/Sensorium: memory, attention, language, and fund of knowledge intact  Insight: fair Judgment: fair  Structured Assessment Results: The Patient Health  Questionnaire-9 (PHQ-9) is a self-report measure that assesses symptoms and severity of depression over the course of the last two weeks. Jessica Snow obtained a score of 14 suggesting moderate depression. Jessica Snow finds the endorsed symptoms to be  somewhat difficult. Little interest or pleasure in doing things 1  Feeling down, depressed, or hopeless 1  Trouble falling or staying asleep, or sleeping too much 3  Feeling tired or having little energy 3  Poor appetite or overeating 2  Feeling bad about yourself --- or that you are a failure or have let yourself or your family down 1  Trouble concentrating on things, such as reading the newspaper or watching television 1  Moving or speaking so slowly that other people could have noticed? Or the opposite --- being so fidgety or restless that you have been moving around a lot more than usual 2  Thoughts that you would be better off dead or hurting yourself in some way 0  PHQ-9 Score 14    The Generalized Anxiety Disorder-7 (GAD-7) is a brief self-report measure that assesses symptoms of anxiety over the course of the last two weeks. Jessica Snow obtained a score of 7 suggesting mild anxiety. Jessica Snow finds the endorsed symptoms to be somewhat difficult. Feeling nervous, anxious, on edge 0  Not being able to stop or control worrying 1  Worrying too much about different things 3  Trouble relaxing 1  Being so restless that it's hard to sit still 0  Becoming easily annoyed or irritable 2  Feeling afraid as if something awful might happen 0  GAD-7 Score 7   Interventions:  Conducted a brief chart review Verbal administration of PHQ-9 and GAD-7 for symptom monitoring Provided empathic reflections and validation Reviewed content from the previous session Psychoeducation provided regarding triggers for emotional eating Focused on rapport building Employed supportive psychotherapy interventions to facilitate reduced distress, and to improve coping skills with  identified stressors  DSM-5 Diagnosis: 311 (F32.8) Other Specified Depressive Disorder, Emotional Eating Behaviors  Treatment Goal & Progress: During the initial appointment with this provider, the following treatment goal was established: decrease emotional eating. Progress is limited, as Riana has just begun treatment with this provider; however, she is receptive to the interaction and interventions and rapport is being established.   Plan: Ambera continues to appear able and willing to participate as evidenced by engagement in reciprocal conversation, and asking questions for clarification as appropriate. The next appointment will be scheduled in three weeks, which will be via [employer]. The next session will focus on reviewing triggers for emotional eating and the introduction of pleasurable activities.

## 2019-06-10 ENCOUNTER — Encounter: Payer: BC Managed Care – PPO | Admitting: Internal Medicine

## 2019-06-14 ENCOUNTER — Ambulatory Visit: Payer: BC Managed Care – PPO | Admitting: Professional

## 2019-06-14 ENCOUNTER — Ambulatory Visit (INDEPENDENT_AMBULATORY_CARE_PROVIDER_SITE_OTHER): Payer: BC Managed Care – PPO | Admitting: Psychology

## 2019-06-14 ENCOUNTER — Other Ambulatory Visit: Payer: Self-pay

## 2019-06-14 DIAGNOSIS — F3289 Other specified depressive episodes: Secondary | ICD-10-CM

## 2019-06-16 ENCOUNTER — Ambulatory Visit (INDEPENDENT_AMBULATORY_CARE_PROVIDER_SITE_OTHER): Payer: BC Managed Care – PPO | Admitting: Bariatrics

## 2019-06-20 ENCOUNTER — Other Ambulatory Visit (INDEPENDENT_AMBULATORY_CARE_PROVIDER_SITE_OTHER): Payer: Self-pay | Admitting: Bariatrics

## 2019-06-20 DIAGNOSIS — E559 Vitamin D deficiency, unspecified: Secondary | ICD-10-CM

## 2019-06-24 ENCOUNTER — Encounter: Payer: BC Managed Care – PPO | Admitting: Internal Medicine

## 2019-06-30 NOTE — Progress Notes (Signed)
Office: [phone]  /  Fax: [phone]    Date: July 06, 2019   Appointment Start Time: 2:32pm Duration: 29 minutes Provider: Lawerance CruelGaytri Burleigh Brockmann, Psy.D. Type of Session: Individual Therapy  Location of Patient: Car in parking lot Location of Provider: Provider's Home Type of Contact: Audio Call  Session Content: Jessica Snow is a 53 y.o. female presenting via a telepsychological follow-up appointment to address the previously established treatment goal of decreasing emotional eating. Of note, Jessica Snow presented via [name]; however, there was a poor connection on her end. Thus, this provider called her via telephone. This provider explained only a land line to land line call is a secure form of connection for an audio call. Jessica Snow acknowledged understanding and provided verbal consent to proceed with this provider using a cell phone and Jessica Snow using a cell phone. Today's appointment was a telepsychological visit, as this provider's clinic is seeing a limited number of patients for in-person visits due to COVID-19. Therapeutic services will resume to in-person appointments once deemed appropriate. Jessica Snow expressed understanding regarding the rationale for telepsychological services, and provided verbal consent for today's appointment. Prior to proceeding with today's appointment, Demetrius's physical location at the time of this appointment was obtained. Jessica Snow reported she was in her car in the parking lot at work and provided the address. In the event of technical difficulties, Jessica Snow shared a phone number she could be reached at. Jessica Snow and this provider participated in today's telepsychological service. Also, Jessica Snow denied anyone else being present in the car or on the call.  This provider conducted a brief check-in and verbally administered the PHQ-9 and GAD-7. Jessica Snow reported being busy as she has resumed "regular hours" at work. Of note, this provider recommended she call the office to schedule an  appointment with Dr. Manson PasseyBrown; she agreed. This provider also discussed the referral previously placed. The number for [facility] was shared and Jessica Snow indicated she will call them and complete the paperwork. Due to ongoing sleeping difficulties, psychoeducation regarding sleep hygiene was provided. Jessica Snow provided verbal consent during today's appointment for this provider to send a handout for sleep hygiene via e-mail. Regarding eating, Jessica Snow described a fluctuation in appetite. She described deviating from the meal plan, but noted, "I'm not indulging as much as I used to." This provider discussed the importance of protein intake. Jessica Snow was receptive to increasing her protein intake during lunch and dinner. Jessica Snow was receptive to today's session as evidenced by openness to sharing, responsiveness to feedback, and willingness to implement discussed strategies.  Mental Status Examination:  Appearance: unable to assess Behavior: unable to assess Mood: euthymic Affect: unable to fully assess Speech: normal in rate, volume, and tone Eye Contact: unable to assess Psychomotor Activity: unable to assess Thought Process: linear, logical, and goal directed  Content/Perceptual Disturbances: no hallucinations, delusions, bizarre thinking or behavior reported or observed and no evidence of suicidal and homicidal ideation, plan, and intent Orientation: time, person, place and purpose of appointment Cognition/Sensorium: memory, attention, language, and fund of knowledge intact  Insight: fair Judgment: fair  Structured Assessment Results: The Patient Health Questionnaire-9 (PHQ-9) is a self-report measure that assesses symptoms and severity of depression over the course of the last two weeks. Jessica Snow obtained a score of 5 suggesting mild depression. Jessica Snow finds the endorsed symptoms to be not difficult at all. Little interest or pleasure in doing things 0  Feeling down, depressed, or hopeless 0   Trouble falling or staying asleep, or sleeping too much 3  Feeling tired  or having little energy 1  Poor appetite or overeating 1  Feeling bad about yourself --- or that you are a failure or have let yourself or your family down 0  Trouble concentrating on things, such as reading the newspaper or watching television 0  Moving or speaking so slowly that other people could have noticed? Or the opposite --- being so fidgety or restless that you have been moving around a lot more than usual 0  Thoughts that you would be better off dead or hurting yourself in some way 0  PHQ-9 Score 5    The Generalized Anxiety Disorder-7 (GAD-7) is a brief self-report measure that assesses symptoms of anxiety over the course of the last two weeks. Kamie obtained a score of 4 suggesting minimal anxiety. Alizay finds the endorsed symptoms to be not difficult at all. Feeling nervous, anxious, on edge 0  Not being able to stop or control worrying 1  Worrying too much about different things 1  Trouble relaxing 1  Being so restless that it's hard to sit still 0  Becoming easily annoyed or irritable 1  Feeling afraid as if something awful might happen 0  GAD-7 Score 4   Interventions:  Conducted a brief chart review Verbal administration of PHQ-9 and GAD-7 for symptom monitoring Provided empathic reflections and validation Psychoeducation provided regarding sleep hygiene Employed supportive psychotherapy interventions to facilitate reduced distress, and to improve coping skills with identified stressors Psychoeducation provided regarding the importance of protein intake  DSM-5 Diagnosis: 311 (F32.8) Other Specified Depressive Disorder, Emotional Eating Behaviors  Treatment Goal & Progress: During the initial appointment with this provider, the following treatment goal was established: decrease emotional eating. Izabellah has demonstrated some progress in her goal as evidenced by increased awareness of hunger  patterns and triggers for emotional eating.   Plan: Myiesha continues to appear able and willing to participate as evidenced by engagement in reciprocal conversation, and asking questions for clarification as appropriate. The next appointment will be scheduled in three weeks, which will be via [employer]. The next session will focus on the introduction of pleasurable activities, as they were not introduced today due to Viola's presenting concerns.

## 2019-07-06 ENCOUNTER — Other Ambulatory Visit: Payer: Self-pay

## 2019-07-06 ENCOUNTER — Ambulatory Visit (INDEPENDENT_AMBULATORY_CARE_PROVIDER_SITE_OTHER): Payer: BC Managed Care – PPO | Admitting: Psychology

## 2019-07-06 DIAGNOSIS — F3289 Other specified depressive episodes: Secondary | ICD-10-CM | POA: Diagnosis not present

## 2019-07-09 ENCOUNTER — Other Ambulatory Visit (INDEPENDENT_AMBULATORY_CARE_PROVIDER_SITE_OTHER): Payer: Self-pay | Admitting: Bariatrics

## 2019-07-09 DIAGNOSIS — E559 Vitamin D deficiency, unspecified: Secondary | ICD-10-CM

## 2019-07-14 NOTE — Progress Notes (Signed)
Office: [phone]  /  Fax: [phone]    Date: July 28, 2019   Appointment Start Time: 8:03am Duration: 24 minutes Provider: Lawerance CruelGaytri Adel Neyer, Psy.D. Type of Session: Individual Therapy  Location of Patient: Car in parking lot at work Location of Provider: Provider's Home Type of Contact: Telepsychological Visit via [employer] [name]   Session Content: Jessica KocherRegina is a 53 y.o. female presenting via [employer] [name] for a follow-up appointment to address the previously established treatment goal of decreasing emotional eating. Of note, this provider called Jessica Snow at 8:02am as [facility] She indicated, "I'm trying to pull it up. The e-mail with the secure link was re-sent. As such, today's appointment was initiated 3 minutes late. Today's appointment was a telepsychological visit, as this provider's clinic is seeing a limited number of patients for in-person visits due to COVID-19. Therapeutic services will resume to in-person appointments once deemed appropriate. Jessica KocherRegina expressed understanding regarding the rationale for telepsychological services, and provided verbal consent for today's appointment. Prior to proceeding with today's appointment, Jessica Snow's physical location at the time of this appointment was obtained. Jessica KocherRegina reported she was parked in her car at work and provided the address. In the event of technical difficulties, Jessica KocherRegina shared a phone number she could be reached at. Jessica KocherRegina and this provider participated in today's telepsychological service. Also, Jessica KocherRegina denied anyone else being present in the car or on the [name] appointment. Moreover, today's appointment was switched to a regular phone call at 8:11am due to connection instability. Jessica KocherRegina provided verbal consent to proceed with this provider using a call via [employer] and Jessica KocherRegina using a cell phone.   This provider conducted a brief check-in and verbally administered the PHQ-9 and GAD-7. Jessica KocherRegina shared about  recent events, including work. This provider checked-in regarding the referral placed for longer-term therapeutic services. Jessica KocherRegina shared she completed and returned the paperwork via e-mail after the last appointment with this provider. She stated she is not scheduled for an appointment yet; therefore, it was recommended she call to schedule an appointment. She agreed.  Regarding eating, Jessica KocherRegina shared she lost 2 pounds; however, she described fluctuations with eating congruent to the meal plan due to being busy. She noted a plan to increase water intake and physical activity. Jessica KocherRegina described emotional eating as "controllable." She noted she has not "had time to overindulge" due to working two jobs. Sleep hygiene was reviewed. She noted she avoided watching television when waking up and noted she walked around the house instead. Jessica Snow described improved sleep. To assist with overall coping, psychoeducation regarding pleasurable activities, including its impact on emotional eating and overall well-being was provided. Jessica KocherRegina was provided with a handout with various options of pleasurable activities, and was encouraged to engage in one activity a day and additional activities as needed when triggered to emotionally eat. Jessica Snow agreed. Jessica KocherRegina provided verbal consent during today's appointment for this provider to send the handout about pleasurable activities via e-mail. Overall, Jessica KocherRegina was receptive to today's session as evidenced by openness to sharing, responsiveness to feedback, and willingness to engage in pleasurable activities.  Mental Status Examination: Behavioral observations based on the initial part of the appointment that had both video/audio capabilities. Appearance: neat Behavior: cooperative Mood: euthymic Affect: mood congruent Speech: normal in rate, volume, and tone Eye Contact: appropriate Psychomotor Activity: appropriate Thought Process: linear, logical, and goal directed   Content/Perceptual Disturbances: no hallucinations, delusions, bizarre thinking or behavior reported or observed and no evidence of suicidal and homicidal  ideation, plan, and intent Orientation: time, person, place and purpose of appointment Cognition/Sensorium: memory, attention, language, and fund of knowledge intact  Insight: fair Judgment: fair  Structured Assessment Results: The Patient Health Questionnaire-9 (PHQ-9) is a self-report measure that assesses symptoms and severity of depression over the course of the last two weeks. Jessica Snow obtained a score of 2 suggesting minimal depression. Jessica Snow finds the endorsed symptoms to be not difficult at all. Little interest or pleasure in doing things 0  Feeling down, depressed, or hopeless 0  Trouble falling or staying asleep, or sleeping too much 1  Feeling tired or having little energy 1  Poor appetite or overeating 0  Feeling bad about yourself --- or that you are a failure or have let yourself or your family down 0  Trouble concentrating on things, such as reading the newspaper or watching television 0  Moving or speaking so slowly that other people could have noticed? Or the opposite --- being so fidgety or restless that you have been moving around a lot more than usual 0  Thoughts that you would be better off dead or hurting yourself in some way 0  PHQ-9 Score 2    The Generalized Anxiety Disorder-7 (GAD-7) is a brief self-report measure that assesses symptoms of anxiety over the course of the last two weeks. Jessica Snow obtained a score of 6 suggesting mild anxiety. Jessica Snow finds the endorsed symptoms to be not difficult at all. Feeling nervous, anxious, on edge 0  Not being able to stop or control worrying 1  Worrying too much about different things 3  Trouble relaxing 1  Being so restless that it's hard to sit still 0  Becoming easily annoyed or irritable 1  Feeling afraid as if something awful might happen 0  GAD-7 Score 6    Interventions:  Conducted a brief chart review Verbal administration of PHQ-9 and GAD-7 for symptom monitoring Provided empathic reflections and validation Reviewed content from the previous session Psychoeducation provided regarding pleasurable activities Employed supportive psychotherapy interventions to facilitate reduced distress, and to improve coping skills with identified stressors  DSM-5 Diagnosis: 311 (F32.8) Other Specified Depressive Disorder, Emotional Eating Behaviors  Treatment Goal & Progress: During the initial appointment with this provider, the following treatment goal was established: decrease emotional eating. Jessica Snow has demonstrated progress in her goal as evidenced by increased awareness of hunger patterns and triggers for emotional eating. Jessica Snow also continues to demonstrate willingness to engage in learned skill(s).  Plan: Jessica Snow continues to appear able and willing to participate as evidenced by engagement in reciprocal conversation, and asking questions for clarification as appropriate. The next appointment will be scheduled in two weeks, which will be via [employer]. The next session will focus on reviewing learned skills, and working towards the established treatment goal.

## 2019-07-26 ENCOUNTER — Telehealth: Payer: Self-pay | Admitting: *Deleted

## 2019-07-26 ENCOUNTER — Ambulatory Visit (INDEPENDENT_AMBULATORY_CARE_PROVIDER_SITE_OTHER): Payer: BC Managed Care – PPO | Admitting: Bariatrics

## 2019-07-26 ENCOUNTER — Other Ambulatory Visit: Payer: Self-pay

## 2019-07-26 VITALS — BP 179/92 | HR 60 | Temp 98.3°F | Ht 64.0 in | Wt 262.0 lb

## 2019-07-26 DIAGNOSIS — F3289 Other specified depressive episodes: Secondary | ICD-10-CM

## 2019-07-26 DIAGNOSIS — Z9189 Other specified personal risk factors, not elsewhere classified: Secondary | ICD-10-CM | POA: Diagnosis not present

## 2019-07-26 DIAGNOSIS — E559 Vitamin D deficiency, unspecified: Secondary | ICD-10-CM | POA: Diagnosis not present

## 2019-07-26 DIAGNOSIS — Z6841 Body Mass Index (BMI) 40.0 and over, adult: Secondary | ICD-10-CM

## 2019-07-26 DIAGNOSIS — I10 Essential (primary) hypertension: Secondary | ICD-10-CM | POA: Diagnosis not present

## 2019-07-26 MED ORDER — VITAMIN D (ERGOCALCIFEROL) 1.25 MG (50000 UNIT) PO CAPS: 50000.0000 [IU] | ORAL_CAPSULE | ORAL | 0 refills | Status: DC

## 2019-07-26 NOTE — Telephone Encounter (Signed)
Call placed to pt re: appt 08/30/2019.  Need to see about switching to virtual. Left a message for pt to call back.

## 2019-07-26 NOTE — Telephone Encounter (Signed)
-----   Message from Charlie Pitter, [place] sent at 07/23/2019 12:30 PM EDT ----- Regarding: RE: Appt As long as she is feeling well and has a way to check BP/HR, yes. If any new symptoms -> regular OV ----- Message ----- From: Jeanann Lewandowsky, RMA Sent: 07/23/2019   8:46 AM EDT To: Charlie Pitter, PA-C Subject: Appt                                           Is this pt eligible for a virtual appt?

## 2019-07-27 ENCOUNTER — Encounter (INDEPENDENT_AMBULATORY_CARE_PROVIDER_SITE_OTHER): Payer: Self-pay | Admitting: Bariatrics

## 2019-07-27 NOTE — Progress Notes (Signed)
Office: [phone]  /  Fax: [phone]   HPI:   Chief Complaint: OBESITY Jessica Snow is here to discuss her progress with her obesity treatment plan. She is on the Category 3 plan and is following her eating plan approximately 0% of the time. She states she is exercising 0 minutes 0 times per week. Alzora is down 2 lbs. She is drinking more water and is getting adequate protein. She is doing better with carbohydrates.  Her weight is 262 lb (118.8 kg) today and has had a weight loss of 2 pounds over a period of 8 weeks since her last visit. She has lost 0 lbs since starting treatment with [place].  Hypertension KYLAR SPEELMAN is a 53 y.o. female with hypertension and is taking spironolactone and Benicar. She states she took her medications today.  Abagael Kramm Pfarr denies chest pain or shortness of breath on exertion. She is working weight loss to help control her blood pressure with the goal of decreasing her risk of heart attack and stroke. Donne's blood pressure is not well controlled.  Depression with emotional eating behaviors Amelie is struggling with emotional eating and using food for comfort to the extent that it is negatively impacting her health. She often snacks when she is not hungry. Jamica sometimes feels she is out of control and then feels guilty that she made poor food choices. She has been working on behavior modification techniques to help reduce her emotional eating and has been somewhat successful. Annelyse has seen Dr. Dewaine Conger. She shows no sign of suicidal or homicidal ideations.  Depression screen [facility] 2/9 05/17/2019 02/15/2019 09/29/2018 08/18/2018  Decreased Interest 3 0 3 0  Down, Depressed, Hopeless 2 0 1 0  PHQ - 2 Score 5 0 4 0  Altered sleeping 3 - 3 -  Tired, decreased energy 3 - 3 -  Change in appetite 2 - 0 -  Feeling bad or failure about yourself  3 - 1 -  Trouble concentrating 2 - 3 -  Moving slowly or fidgety/restless 0 - 0 -  Suicidal thoughts 0 - 0 -  PHQ-9  Score 18 - 14 -  Difficult doing work/chores - - Somewhat difficult -   Vitamin D deficiency Earma has a diagnosis of Vitamin D deficiency. She is currently taking prescription Vit D and denies nausea, vomiting or muscle weakness.  At risk for osteopenia and osteoporosis Romilda is at higher risk of osteopenia and osteoporosis due to Vitamin D deficiency.   ASSESSMENT AND PLAN:  Essential hypertension  Vitamin D deficiency - Plan: Vitamin D, Ergocalciferol, (DRISDOL) 1.25 MG (50000 UT) CAPS capsule  Other depression  At risk for osteoporosis  Class 3 severe obesity with serious comorbidity and body mass index (BMI) of 45.0 to 49.9 in adult, unspecified obesity type (HCC)  PLAN:  Hypertension We discussed sodium restriction, working on healthy weight loss, and a regular exercise program as the means to achieve improved blood pressure control. Tanish agreed with this plan and agreed to follow up as directed. We will continue to monitor her blood pressure as well as her progress with the above lifestyle modifications. Robena will continue her blood pressure medications as prescribed and will watch for signs of hypotension as she continues her lifestyle modifications. She will discuss with her PCP if pressure remains elevated.  Depression with Emotional Eating Behaviors We discussed CBT techniques. She will follow-up as directed to monitor her progress.  Vitamin D Deficiency Tahnee was informed that low Vitamin D  levels contributes to fatigue and are associated with obesity, breast, and colon cancer. She agrees to continue to take prescription Vit D @ 50,000 IU every week #4 with 0 refills and will follow-up for routine testing of Vitamin D, at least 2-3 times per year. She was informed of the risk of over-replacement of Vitamin D and agrees to not increase her dose unless she discusses this with us first. [name] agrees to follow-up with our clinic in 2 weeks.  At risk for osteopenia and  osteoporosis [name] was given extended  (15 minutes) osteoporosis prevention counseling today. [name] is at risk for osteopenia and osteoporosis due to her Vitamin D deficiency. She was encouraged to take her Vitamin D and follow her higher calcium diet and increase strengthening exercise to help strengthen her bones and decrease her risk of osteopenia and osteoporosis.  Obesity [name] is currently in the action stage of change. As such, her goal is to continue with weight loss efforts. She has agreed to follow the Category 3 plan. [name] will work on meal planning, intentional eating, and increasing her water intake. [name] has been instructed to increase exercise (walking) for weight loss and overall health benefits. We discussed the following Behavioral Modification Strategies today: increasing lean protein intake, decreasing simple carbohydrates, increasing vegetables, increase H20 intake, decrease eating out, no skipping meals, work on meal planning and easy cooking plans, keeping healthy foods in the home, and planning for success.  [name] has agreed to follow-up with our clinic in 2 weeks. She was informed of the importance of frequent follow-up visits to maximize her success with intensive lifestyle modifications for her multiple health conditions.  ALLERGIES: Allergies  Allergen Reactions  . Honey Bee Treatment [Bee Venom]   . Wheat Bran     MEDICATIONS: Current Outpatient Medications on File Prior to Visit  Medication Sig Dispense Refill  . albuterol (PROVENTIL) (2.5 MG/3ML) 0.083% nebulizer solution 1 vial in neb every 4 hours as needed    . albuterol (VENTOLIN HFA) 108 (90 Base) MCG/ACT inhaler TAKE 2 PUFFS BY MOUTH EVERY 4 TO 6 HOURS AS NEEDED 8.5 Inhaler 11  . benzonatate (TESSALON) 200 MG capsule TAKE 1 CAPSULE BY MOUTH EVERY DAY 3 TIMES A DAY AS NEEDED FOR COUGH    . budesonide-formoterol (SYMBICORT) 80-4.5 MCG/ACT inhaler Inhale 2 puffs into the lungs 2 (two) times daily.  1 Inhaler 11  . famotidine (PEPCID) 20 MG tablet One at bedtime 30 tablet 11  . furosemide (LASIX) 40 MG tablet Take 1 tablet (40 mg total) by mouth daily. 90 tablet 3  . gabapentin (NEURONTIN) 100 MG capsule TAKE 1 CAPSULE (100 MG TOTAL) BY MOUTH 3 (THREE) TIMES DAILY. 90 capsule 0  . Liraglutide -Weight Management (SAXENDA) 18 MG/3ML SOPN Inject 0.6 mg into the skin daily.    . montelukast (SINGULAIR) 10 MG tablet Take 1 tablet (10 mg total) by mouth at bedtime. 30 tablet 11  . olmesartan (BENICAR) 40 MG tablet Take 40 mg by mouth daily.    . pantoprazole (PROTONIX) 40 MG tablet Take 1 tablet by mouth daily 180 tablet 0  . spironolactone (ALDACTONE) 25 MG tablet Take 0.5 tablets (12.5 mg total) by mouth daily. 45 tablet 3   No current facility-administered medications on file prior to visit.     PAST MEDICAL HISTORY: Past Medical History:  Diagnosis Date  . Asthma   . Chronic rhinitis   . Cough   . Edema, lower extremity   . GERD (gastroesophageal reflux disease)   .  HTN (hypertension)   . Morbid obesity (HCC)   . Murmur   . Pneumonia   . Pre-diabetes   . SOB (shortness of breath)     PAST SURGICAL HISTORY: Past Surgical History:  Procedure Laterality Date  . TOTAL VAGINAL HYSTERECTOMY  2007    SOCIAL HISTORY: Social History   Tobacco Use  . Smoking status: Former Smoker    Packs/day: 0.25    Years: 10.00    Pack years: 2.50    Types: Cigarettes    Quit date: 01/26/2018    Years since quitting: 1.4  . Smokeless tobacco: Former Engineer, water Use Topics  . Alcohol use: No  . Drug use: No    FAMILY HISTORY: Family History  Problem Relation Age of Onset  . Hypertension Mother   . Diabetes Mother   . Heart disease Mother   . Sleep apnea Mother   . Obesity Mother   . Hypertension Father   . Anemia Father    ROS: Review of Systems  Respiratory: Negative for shortness of breath.   Cardiovascular: Negative for chest pain.  Gastrointestinal: Negative for  nausea and vomiting.  Musculoskeletal:       Negative for muscle weakness.  Psychiatric/Behavioral: Positive for depression (emotional eating). Negative for suicidal ideas.       Negative for homicidal ideas.   PHYSICAL EXAM: Blood pressure (!) 179/92, pulse 60, temperature 98.3 F (36.8 C), temperature source Oral, height 5\' 4"  (1.626 m), weight 262 lb (118.8 kg), last menstrual period 12/19/2005, SpO2 96 %. Body mass index is 44.97 kg/m. Physical Exam Vitals signs reviewed.  Constitutional:      Appearance: Normal appearance. She is obese.  Cardiovascular:     Rate and Rhythm: Normal rate.     Pulses: Normal pulses.  Pulmonary:     Effort: Pulmonary effort is normal.     Breath sounds: Normal breath sounds.  Musculoskeletal: Normal range of motion.  Skin:    General: Skin is warm and dry.  Neurological:     Mental Status: She is alert and oriented to person, place, and time.  Psychiatric:        Behavior: Behavior normal.   RECENT LABS AND TESTS: BMET    Component Value Date/Time   NA 143 04/21/2019 1410   K 4.2 04/21/2019 1410   CL 104 04/21/2019 1410   CO2 24 04/21/2019 1410   GLUCOSE 65 04/21/2019 1410   GLUCOSE 97 08/31/2018 1149   BUN 13 04/21/2019 1410   CREATININE 0.75 04/21/2019 1410   CREATININE 0.66 12/20/2011 1710   CALCIUM 9.8 04/21/2019 1410   GFRNONAA 93 04/21/2019 1410   GFRAA 107 04/21/2019 1410   Lab Results  Component Value Date   HGBA1C 6.2 (H) 05/17/2019   HGBA1C 6.5 (H) 03/17/2019   HGBA1C 6.5 (H) 09/29/2018   No results found for: INSULIN CBC    Component Value Date/Time   WBC 7.4 10/09/2018 1138   WBC 7.3 05/15/2018 1113   RBC 4.86 10/09/2018 1138   RBC 4.95 05/15/2018 1113   HGB 12.4 10/09/2018 1138   HCT 38.3 10/09/2018 1138   PLT 253 10/09/2018 1138   MCV 79 10/09/2018 1138   MCH 25.5 (L) 10/09/2018 1138   MCH 26.6 10/08/2013 0953   MCHC 32.4 10/09/2018 1138   MCHC 32.0 05/15/2018 1113   RDW 15.8 (H) 10/09/2018 1138    LYMPHSABS 2.6 08/18/2018 1019   MONOABS 0.5 05/15/2018 1113   EOSABS 0.4 08/18/2018 1019  BASOSABS 0.0 08/18/2018 1019   Iron/TIBC/Ferritin/ %Sat No results found for: IRON, TIBC, FERRITIN, IRONPCTSAT Lipid Panel     Component Value Date/Time   CHOL 154 05/17/2019 1402   TRIG 65 05/17/2019 1402   HDL 55 05/17/2019 1402   LDLCALC 86 05/17/2019 1402   Hepatic Function Panel     Component Value Date/Time   PROT 7.4 04/21/2019 1410   ALBUMIN 4.3 04/21/2019 1410   AST 23 04/21/2019 1410   ALT 28 04/21/2019 1410   ALKPHOS 89 04/21/2019 1410   BILITOT 0.5 04/21/2019 1410   BILIDIR 0.14 04/21/2019 1410      Component Value Date/Time   TSH 1.030 04/21/2019 1410   TSH 1.090 03/17/2019 1516   Results for KAMAURI, KATHOL (MRN [id number]) as of 07/27/2019 06:48  Ref. Range 05/17/2019 14:02  Vitamin D, 25-Hydroxy Latest Ref Range: 30.0 - 100.0 ng/mL 23.1 (L)   OBESITY BEHAVIORAL INTERVENTION VISIT  Today's visit was #3  Starting weight: 261 lbs Starting date: 05/17/2019 Today's weight: 262 lbs  Today's date: 07/26/2019 Total lbs lost to date: 0   07/26/2019  Height 5\' 4"  (1.626 m)  Weight 262 lb (118.8 kg)  BMI (Calculated) 44.95  BLOOD PRESSURE - SYSTOLIC 481  BLOOD PRESSURE - DIASTOLIC 92   Body Fat % 85.6 %  Total Body Water (lbs) 93.8 lbs   ASK: We discussed the diagnosis of obesity with [name] today and Jany agreed to give [place] permission to discuss obesity behavioral modification therapy today.  ASSESS: Berania has the diagnosis of obesity and her BMI today is 45.0. Taraya is in the action stage of change.   ADVISE: Fantasy was educated on the multiple health risks of obesity as well as the benefit of weight loss to improve her health. She was advised of the need for long term treatment and the importance of lifestyle modifications to improve her current health and to decrease her risk of future health problems.  AGREE: Multiple dietary modification  options and treatment options were discussed and  Renae agreed to follow the recommendations documented in the above note.  ARRANGE: Shrika was educated on the importance of frequent visits to treat obesity as outlined per CMS and USPSTF guidelines and agreed to schedule her next follow up appointment today.  Migdalia Dk, am acting as Location manager for [employer], DO  I have reviewed the above documentation for accuracy and completeness, and I agree with the above. -[name], DO

## 2019-07-28 ENCOUNTER — Other Ambulatory Visit: Payer: Self-pay

## 2019-07-28 ENCOUNTER — Ambulatory Visit (INDEPENDENT_AMBULATORY_CARE_PROVIDER_SITE_OTHER): Payer: BC Managed Care – PPO | Admitting: Psychology

## 2019-07-28 DIAGNOSIS — F3289 Other specified depressive episodes: Secondary | ICD-10-CM

## 2019-08-03 NOTE — Progress Notes (Signed)
Office: [phone]  /  Fax: [phone]    Date: August 09, 2019   Appointment Start Time: 8:34am Duration: 27 minutes Provider: Lawerance Cruel, Psy.D. Type of Session: Individual Therapy  Location of Patient: Parked in car Location of Provider: Provider's Home Type of Contact: Telepsychological Visit via [facility] [name] & Audio Call via [employer]   Session Content: This provider called Jessica Snow at 8:32am as she did not present for the [facility] appointment. She noted, "I'm trying to get logged in." As such, today's appointment was initiated 4 minutes late.  Jessica Snow is a 53 y.o. female presenting via [employer] [name] for a follow-up appointment to address the previously established treatment goal of decreasing emotional eating. Today's appointment was a telepsychological visit, as it is an option for appointments to reduce exposure to COVID-19. Jessica Snow expressed understanding regarding the rationale for telepsychological services, and provided verbal consent for today's appointment. Prior to proceeding with today's appointment, Jessica Snow's physical location at the time of this appointment was obtained. Jessica Snow reported she was parked in her car at work and provided the address. In the event of technical difficulties, Jessica Snow shared a phone number she could be reached at. Jessica Snow and this provider participated in today's telepsychological service. Also, Jessica Snow denied anyone else being present in the car or on the [name] appointment. Of note, the call was switched to a regular phone call at 8:37am due to connection issues on Jessica Snow's end. This provider called Jessica Snow using [employer].   This provider conducted a brief check-in and verbally administered the PHQ-9 and GAD-7. Jessica Snow reported "nothing much" has changed since the last appointment. She indicated she did not call [facility] as she "got busy." She indicated she will call after today's appointment with this provider. Regarding eating, Jessica Snow stated  she lost 2 pounds. She noted a reduction in emotional eating and overeating, as she is busy with work. Pleasurable activities were reviewed. She shared she talked to a friend and a relative she had not spoken with in a while. It was recommended she continue to try to engage in one activity a day. Obstacles/barries that may get in the way of follow through were explored. Jessica Snow noted, "Probably myself. I don't make time for myself." This was explored further and she shared about her daily routine; therefore, activities she could incorporate throughout her day were explored (e.g., listening to music while driving, singing favorite songs while driving, chatting with ex- sister-in-law during a break, watching favorite television show while engaging in chores). Furthermore, this provider and Jessica Snow discussed termination planning. As part of termination planning, Jessica Snow was again encouraged to call [facility]. She will continue meeting with this provider until she has an initial appointment scheduled with a new provider. Overall, Jessica Snow was receptive to today's session as evidenced by openness to sharing, responsiveness to feedback, and willingness to continue engaging in pleasurable activities.  Mental Status Examination:  Appearance: neat Behavior: cooperative Mood: euthymic Affect: mood congruent Speech: normal in rate, volume, and tone Eye Contact: appropriate Psychomotor Activity: appropriate Thought Process: linear, logical, and goal directed  Content/Perceptual Disturbances: no hallucinations, delusions, bizarre thinking or behavior reported or observed and no evidence of suicidal and homicidal ideation, plan, and intent Orientation: time, person, place and purpose of appointment Cognition/Sensorium: memory, attention, language, and fund of knowledge intact  Insight: fair Judgment: fair  Structured Assessment Results: The Patient Health Questionnaire-9 (PHQ-9) is a self-report  measure that assesses symptoms and severity of depression over the course of the last two  weeks. Jessica Snow obtained a score of 6 suggesting mild depression. Jessica Snow finds the endorsed symptoms to be not difficult at all. Jessica Snow interest or pleasure in doing things 1  Feeling down, depressed, or hopeless 0  Trouble falling or staying asleep, or sleeping too much 1  Feeling tired or having Jessica Snow energy 1  Poor appetite or overeating 0  Feeling bad about yourself --- or that you are a failure or have let yourself or your family down- "Only with the weight gain." 1  Trouble concentrating on things, such as reading the newspaper or watching television 0  Moving or speaking so slowly that other people could have noticed? Or the opposite --- being so fidgety or restless that you have been moving around a lot more than usual 2  Thoughts that you would be better off dead or hurting yourself in some way 0  PHQ-9 Score 6    The Generalized Anxiety Disorder-7 (GAD-7) is a brief self-report measure that assesses symptoms of anxiety over the course of the last two weeks. Jessica Snow obtained a score of 4 suggesting minimal anxiety. Jessica Snow finds the endorsed symptoms to be somewhat difficult. Feeling nervous, anxious, on edge 0  Not being able to stop or control worrying 1  Worrying too much about different things 1  Trouble relaxing 1  Being so restless that it's hard to sit still 0  Becoming easily annoyed or irritable 1  Feeling afraid as if something awful might happen 0  GAD-7 Score 4   Interventions:  Conducted a brief chart review Verbal administration of PHQ-9 and GAD-7 for symptom monitoring Provided empathic reflections and validation Reviewed content from the previous session Engaged patient in problem solving Discussed termination planning Employed supportive psychotherapy interventions to facilitate reduced distress, and to improve coping skills with identified stressors  DSM-5 Diagnosis: 311  (F32.8) Other Specified Depressive Disorder, Emotional Eating Behaviors  Treatment Goal & Progress: During the initial appointment with this provider, the following treatment goal was established: decrease emotional eating. Janiqua has demonstrated progress in her goal as evidenced by increased awareness of hunger patterns and triggers for emotional eating. Avary also reported a reduction in emotional eating and demonstrates willingness to engage in pleasurable activities.  Plan: Jessica Snow continues to appear able and willing to participate as evidenced by engagement in reciprocal conversation, and asking questions for clarification as appropriate. Jessica Snow acknowledged understanding that due to this provider being out of the office in the coming weeks and appointment availability, the next appointment will be scheduled in three weeks, which will be via [employer]. The next session will focus on the introduction of mindfulness as it was not introduced during today's appointment due to Jessica Snow's presenting concerns.

## 2019-08-09 ENCOUNTER — Ambulatory Visit (INDEPENDENT_AMBULATORY_CARE_PROVIDER_SITE_OTHER): Payer: BC Managed Care – PPO | Admitting: Psychology

## 2019-08-09 ENCOUNTER — Encounter (INDEPENDENT_AMBULATORY_CARE_PROVIDER_SITE_OTHER): Payer: Self-pay

## 2019-08-09 ENCOUNTER — Other Ambulatory Visit: Payer: Self-pay

## 2019-08-09 ENCOUNTER — Ambulatory Visit (INDEPENDENT_AMBULATORY_CARE_PROVIDER_SITE_OTHER): Payer: BC Managed Care – PPO | Admitting: Bariatrics

## 2019-08-09 DIAGNOSIS — F3289 Other specified depressive episodes: Secondary | ICD-10-CM

## 2019-08-23 ENCOUNTER — Other Ambulatory Visit: Payer: Self-pay | Admitting: Internal Medicine

## 2019-08-24 NOTE — Progress Notes (Unsigned)
Office: [phone]  /  Fax: [phone]    Date: August 30, 2019   Appointment Start Time: *** Duration: *** minutes Provider: Glennie Isle, Psy.D. Type of Session: Individual Therapy  Location of Patient: {gbptloc:23249} Location of Provider: {Location of Service:22491} Type of Contact: Telepsychological Visit via [employer] [name]   Session Content: Jessica Snow is a 53 y.o. female presenting via [place] for a follow-up appointment to address the previously established treatment goal of decreasing emotional eating. Today's appointment was a telepsychological visit, as it is an option for appointments to reduce exposure to COVID-19. Jessica Snow expressed understanding regarding the rationale for telepsychological services, and provided verbal consent for today's appointment. Prior to proceeding with today's appointment, Jessica Snow's physical location at the time of this appointment was obtained. In the event of technical difficulties, Jessica Snow shared a phone number she could be reached at. Ananiah and this provider participated in today's telepsychological service. Also, Jakara denied anyone else being present in the room or on the [name] appointment ***.  This provider conducted a brief check-in and verbally administered the PHQ-9 and GAD-7. *** Rena was receptive to today's session as evidenced by openness to sharing, responsiveness to feedback, and ***.  Mental Status Examination:  Appearance: {Appearance:22431} Behavior: {Behavior:22445} Mood: {gbmood:21757} Affect: {Affect:22436} Speech: {Speech:22432} Eye Contact: {Eye Contact:22433} Psychomotor Activity: {Motor Activity:22434} Thought Process: {thought process:22448}  Content/Perceptual Disturbances: {disturbances:22451} Orientation: {Orientation:22437} Cognition/Sensorium: {gbcognition:22449} Insight: {Insight:22446} Judgment: {Insight:22446}  Structured Assessment Results: The Patient Health Questionnaire-9 (PHQ-9) is a self-report  measure that assesses symptoms and severity of depression over the course of the last two weeks. Jessica Snow obtained a score of *** suggesting {GBPHQ9SEVERITY:21752}. Jessica Snow finds the endorsed symptoms to be {gbphq9difficulty:21754}. Jessica Snow interest or pleasure in doing things ***  Feeling down, depressed, or hopeless ***  Trouble falling or staying asleep, or sleeping too much ***  Feeling tired or having Jessica Snow energy ***  Poor appetite or overeating ***  Feeling bad about yourself --- or that you are a failure or have let yourself or your family down ***  Trouble concentrating on things, such as reading the newspaper or watching television ***  Moving or speaking so slowly that other people could have noticed? Or the opposite --- being so fidgety or restless that you have been moving around a lot more than usual ***  Thoughts that you would be better off dead or hurting yourself in some way ***  PHQ-9 Score ***    The Generalized Anxiety Disorder-7 (GAD-7) is a brief self-report measure that assesses symptoms of anxiety over the course of the last two weeks. Jessica Snow obtained a score of *** suggesting {gbgad7severity:21753}. Jessica Snow finds the endorsed symptoms to be {gbphq9difficulty:21754}. Feeling nervous, anxious, on edge ***  Not being able to stop or control worrying ***  Worrying too much about different things ***  Trouble relaxing ***  Being so restless that it's hard to sit still ***  Becoming easily annoyed or irritable ***  Feeling afraid as if something awful might happen ***  GAD-7 Score ***   Interventions:  {Interventions:22172}  DSM-5 Diagnosis: 311 (F32.8) Other Specified Depressive Disorder, Emotional Eating Behaviors  Treatment Goal & Progress: During the initial appointment with this provider, the following treatment goal was established: decrease emotional eating. Jessica Snow has demonstrated progress in her goal as evidenced by {gbtxprogress:22839}. Jessica Snow also reported  {gbtxprogress2:22951}.  Plan: Jessica Snow continues to appear able and willing to participate as evidenced by engagement in reciprocal conversation, and asking questions for clarification as appropriate. The next appointment  will be scheduled in {gbweeks:21758}, which will be via [employer]. The next session will focus on reviewing learned skills, and working towards the established treatment goal.***

## 2019-08-26 ENCOUNTER — Encounter (INDEPENDENT_AMBULATORY_CARE_PROVIDER_SITE_OTHER): Payer: Self-pay

## 2019-08-29 ENCOUNTER — Encounter: Payer: Self-pay | Admitting: Physician Assistant

## 2019-08-29 NOTE — Progress Notes (Addendum)
Virtual Visit via Telephone Note   This visit type was conducted due to national recommendations for restrictions regarding the COVID-19 Pandemic (e.g. social distancing) in an effort to limit this patient's exposure and mitigate transmission in our community.  Due to her co-morbid illnesses, this patient is at least at moderate risk for complications without adequate follow up.  This format is felt to be most appropriate for this patient at this time.  The patient did not have access to video technology/had technical difficulties with video requiring transitioning to audio format only (telephone).  All issues noted in this document were discussed and addressed.  No physical exam could be performed with this format.  The patient consented to telehealth visit today with [facility]. The patient was offered a virtual versus in person office visit in an effort to limit physical exposure during the Covid-19 pandemic and they elected to proceed with virtual visit.  Date:  08/30/2019   ID:  Jessica Snow, DOB Aug 17, 1966, MRN [id number]  Patient Location: Home Provider Location: Office [facility])  PCP:  Glendale Chard, MD  Cardiologist:  Ena Dawley, MD  Electrophysiologist:  None   Evaluation Performed:  Follow-Up Visit  Chief Complaint: f/u CHF  History of Present Illness:    Jessica Snow is a 53 y.o. female with HTN, chronic diastolic CHF, morbid obesity, asthma, mildly dilated ascending aorta, asthma, former tobacco abuse, pre-DM, lipids followed by PCP who presents for routine follow-up. She remotely saw Dr. Percival Spanish. More recently she saw Dr. Meda Coffee 10/09/2018 for SOB who thought she had acute bronchitis and gave her a steroid taper and started her on Lasix, Spironolactone, and olmesartan and stopped her HCTZ. She also ordered a coronary CTA. Calcium score was 0 previous, poor quality study secondary to patient size and motion however no evidence of CAD, dilated pulmonary artery  measuring 35 mm suggestive of pulmonary hypertension. 2D Echo 08/27/18 showed EF 60-65%, grade 2 DD, normal PA pressure, mild AI, mildly dilated ascending aorta, trileaflet AV. She was referred back to pulmonology in 03/2019 as it was felt that her asthma was likely contributing. Last labs 03/2019 with A1C 6.2, LDL 86, 03/2019 LFTs wnl, TSH wnl, Mg wnl, BMET wnl w/ K 4.2, Cr 0.75.   She is seen back virtually today reporting essentially identical complaints to the previous visit in 03/2019. She continues to have lower extremity edema as well as swelling in her hands. She gets winded easily with exertion. She denies any chest pain or palpitations. No SOB at rest or orthopnea reported. She reports her current cuff won't fit on her arm so she does not follow her blood pressure at home. Her BP was quite elevated at 179/92 at bariatric visit on 07/26/19. She also reports occasional muscle cramping similar to previous OV. She reports she saw pulmonology and was felt to be doing well with routine f/u recommended.  The patient does not have symptoms concerning for COVID-19 infection (fever, chills, cough, or new shortness of breath).    Past Medical History:  Diagnosis Date   Ascending aorta dilatation (HCC)    Asthma    Chronic diastolic CHF (congestive heart failure) (HCC)    Chronic rhinitis    Cough    Edema, lower extremity    GERD (gastroesophageal reflux disease)    HTN (hypertension)    Mild aortic insufficiency    Morbid obesity ([place])    Murmur    Pneumonia    Pre-diabetes    Past Surgical  History:  Procedure Laterality Date   TOTAL VAGINAL HYSTERECTOMY  2007     Current Meds  Medication Sig   albuterol (PROVENTIL) (2.5 MG/3ML) 0.083% nebulizer solution 1 vial in neb every 4 hours as needed   albuterol (VENTOLIN HFA) 108 (90 Base) MCG/ACT inhaler TAKE 2 PUFFS BY MOUTH EVERY 4 TO 6 HOURS AS NEEDED   budesonide-formoterol (SYMBICORT) 80-4.5 MCG/ACT inhaler Inhale 2 puffs  into the lungs 2 (two) times daily.   famotidine (PEPCID) 20 MG tablet One at bedtime   furosemide (LASIX) 40 MG tablet Take 1 tablet (40 mg total) by mouth daily.   gabapentin (NEURONTIN) 100 MG capsule TAKE 1 CAPSULE (100 MG TOTAL) BY MOUTH 3 (THREE) TIMES DAILY.   Liraglutide -Weight Management (SAXENDA) 18 MG/3ML SOPN Inject 0.6 mg into the skin daily.   montelukast (SINGULAIR) 10 MG tablet Take 1 tablet (10 mg total) by mouth at bedtime.   olmesartan (BENICAR) 40 MG tablet Take 40 mg by mouth daily.   pantoprazole (PROTONIX) 40 MG tablet Take 1 tablet by mouth daily   spironolactone (ALDACTONE) 25 MG tablet Take 0.5 tablets (12.5 mg total) by mouth daily.   Vitamin D, Ergocalciferol, (DRISDOL) 1.25 MG (50000 UT) CAPS capsule Take 1 capsule (50,000 Units total) by mouth every 7 (seven) days.     Allergies:   Honey bee treatment [bee venom] and Wheat bran   Social History   Tobacco Use   Smoking status: Former Smoker    Packs/day: 0.25    Years: 10.00    Pack years: 2.50    Types: Cigarettes    Quit date: 01/26/2018    Years since quitting: 1.5   Smokeless tobacco: Former NeurosurgeonUser  Substance Use Topics   Alcohol use: No   Drug use: No     Family Hx: The patient's family history includes Anemia in her father; Diabetes in her mother; Heart disease in her mother; Hypertension in her father and mother; Obesity in her mother; Sleep apnea in her mother.  ROS:   Please see the history of present illness.    All other systems reviewed and are negative.   Prior CV studies:    Most recent pertinent cardiac studies are outlined above.  Labs/Other Tests and Data Reviewed:    EKG:  An ECG dated 05/17/19 was personally reviewed today and demonstrated:  NSR 65bpm, voltage criteria for LVH, nonspecific TW changes  Recent Labs: 10/09/2018: Hemoglobin 12.4; Platelets 253 02/17/2019: NT-Pro BNP 58 04/21/2019: ALT 28; BUN 13; Creatinine, Ser 0.75; Magnesium 2.0; Potassium 4.2;  Sodium 143; TSH 1.030   Recent Lipid Panel Lab Results  Component Value Date/Time   CHOL 154 05/17/2019 02:02 PM   TRIG 65 05/17/2019 02:02 PM   HDL 55 05/17/2019 02:02 PM   LDLCALC 86 05/17/2019 02:02 PM    Wt Readings from Last 3 Encounters:  08/30/19 260 lb (117.9 kg)  07/26/19 262 lb (118.8 kg)  06/01/19 264 lb (119.7 kg)     Objective:    Vital Signs:  Ht 5\' 3"  (1.6 m)    Wt 260 lb (117.9 kg)    LMP 12/19/2005    BMI 46.06 kg/m    VS reviewed. General - pleasant F in no acute distress Pulm - No labored breathing, no coughing during visit, no audible wheezing, speaking in full sentences Neuro - A+Ox3, no slurred speech, answers questions appropriately Psych - Pleasant affect    ASSESSMENT & PLAN:    1. Chronic diastolic CHF with dyspnea  on exertion and muscle cramping - difficult to assess volume clinically over the phone but it sounds like she has not made much progress from last visit. She still reports significant DOE but states pulmonology told her everything was fine from a lung standpoint. Will bring her in for repeat echocardiogram and labwork (BMET, pBNP, CBC) and an in-person visit thereafter. I encouraged her to try to get a BP cuff that she can use as uncontrolled BP may be contributing. She had a visit recently with bariatric medicine program and it was 179/92. She will call/mychart as soon as she gets a cuff so we can review. Amlodipine has likely been avoided due to chronic edema. Beta blocker is also less ideal given the severity at which Dr. Delton See previously felt asthma was contributing. Titration of spironolactone would be a potential option as long as kidney function and electrolytes are OK. 2. Muscle cramping - check BMET, Mg. 3. Essential HTN - as above, needs close follow-up. Of note, she had a sleep study last year that was negative for OSA. We also discussed the importance of long term weight loss on her prognosis and comorbidities. Of note TSH wnl  03/2019. 4. Mildly dilated ascending aorta - due for repeat assessment, will arrange echo. 5. Mild aortic insufficiency - since we are getting echocardiogram to assess her ascending aorta dilation, this will also be included in the report as well.  COVID-19 Education: Patient is aware of signs/symptoms of Covid-19 and is also actively followed by primary care.  Time:   Today, I have spent 20 minutes with the patient with telehealth technology discussing the above problems.     Medication Adjustments/Labs and Tests Ordered: Current medicines are reviewed at length with the patient today.  Concerns regarding medicines are outlined above.   Disposition:  Follow up as outlined above for in-office assessment after echocardiogram. Emphasized importance of getting BP reading to [place] before then.  Signed, Laurann Montana, PA-C  08/30/2019 3:15 PM    [facility]

## 2019-08-30 ENCOUNTER — Telehealth (INDEPENDENT_AMBULATORY_CARE_PROVIDER_SITE_OTHER): Payer: BC Managed Care – PPO | Admitting: Physician Assistant

## 2019-08-30 ENCOUNTER — Other Ambulatory Visit: Payer: Self-pay

## 2019-08-30 ENCOUNTER — Ambulatory Visit (INDEPENDENT_AMBULATORY_CARE_PROVIDER_SITE_OTHER): Payer: BC Managed Care – PPO | Admitting: Psychology

## 2019-08-30 ENCOUNTER — Encounter: Payer: Self-pay | Admitting: Physician Assistant

## 2019-08-30 ENCOUNTER — Encounter: Payer: Self-pay | Admitting: *Deleted

## 2019-08-30 VITALS — Ht 63.0 in | Wt 260.0 lb

## 2019-08-30 DIAGNOSIS — I10 Essential (primary) hypertension: Secondary | ICD-10-CM

## 2019-08-30 DIAGNOSIS — I5032 Chronic diastolic (congestive) heart failure: Secondary | ICD-10-CM | POA: Diagnosis not present

## 2019-08-30 DIAGNOSIS — R252 Cramp and spasm: Secondary | ICD-10-CM

## 2019-08-30 DIAGNOSIS — R0600 Dyspnea, unspecified: Secondary | ICD-10-CM

## 2019-08-30 DIAGNOSIS — I77810 Thoracic aortic ectasia: Secondary | ICD-10-CM

## 2019-08-30 DIAGNOSIS — I351 Nonrheumatic aortic (valve) insufficiency: Secondary | ICD-10-CM

## 2019-08-30 DIAGNOSIS — R0609 Other forms of dyspnea: Secondary | ICD-10-CM

## 2019-08-30 NOTE — Patient Instructions (Addendum)
Medication Instructions:  Your physician recommends that you continue on your current medications as directed. Please refer to the Current Medication list given to you today.  *If you need a refill on your cardiac medications before your next appointment, please call your pharmacy*  Lab Work: Tomorrow 08/31/2019 at time of the Echocardiogram:  BMET, CBC, MAG, & PRO BNP  If you have labs (blood work) drawn today and your tests are completely normal, you will receive your results only by: Marland Kitchen MyChart Message (if you have MyChart) OR . A paper copy in the mail If you have any lab test that is abnormal or we need to change your treatment, we will call you to review the results.  Testing/Procedures: Your physician has requested that you have an echocardiogram TOMORROW 08/31/2019 ARRIVE AT 7:15 A.M.  DON'T FORGET TO VISIT THE LAB AS WELL.   Echocardiography is a painless test that uses sound waves to create images of your heart. It provides your doctor with information about the size and shape of your heart and how well your heart's chambers and valves are working. This procedure takes approximately one hour. There are no restrictions for this procedure.    Follow-Up: At [facility], you and your health needs are our priority.  As part of our continuing mission to provide you with exceptional heart care, we have created designated Provider Care Teams.  These Care Teams include your primary Cardiologist (physician) and Advanced Practice Providers (APPs -  Physician Assistants and Nurse Practitioners) who all work together to provide you with the care you need, when you need it.  Your next appointment:   THIS FRIDAY, 09/03/2019 ARRIVE AT 8:30 A.M.  The format for your next appointment:   In Person  Provider:   Richardson Dopp, PA-C  Other Instructions  Please get a blood pressure cuff that goes on your arm. The wrist ones can be inaccurate. If possible, try to select one that also reports your  heart rate. To check your blood pressure, choose a time about 3 hours after taking your blood pressure medicines. Remain seated in a chair for 5 minutes quietly beforehand, then check it. When you get a cuff, please check your blood pressure and heart rate and send in the numbers for [place] to review.   For patients with congestive heart failure, we give them these special instructions:  1. Follow a low-salt diet - you are allowed no more than 2,000mg  of sodium per day. Watch your fluid intake. In general, you should not be taking in more than about 64oz of fluid per day. This includes sources of water in foods like soup, coffee, tea, milk, etc.  2. Weigh yourself on the same scale at same time of day and keep a log.  3. Call your doctor: (Anytime you feel any of the following symptoms)  - 3lb weight gain overnight or 5lb within a few days  - Shortness of breath, with or without a dry hacking cough  - Swelling in the hands, feet or stomach  - If you have to sleep on extra pillows at night in order to breathe   IT IS IMPORTANT TO LET YOUR DOCTOR KNOW EARLY ON IF YOU ARE HAVING SYMPTOMS SO WE CAN HELP YOU

## 2019-08-31 ENCOUNTER — Other Ambulatory Visit: Payer: BC Managed Care – PPO

## 2019-08-31 ENCOUNTER — Other Ambulatory Visit: Payer: Self-pay

## 2019-08-31 ENCOUNTER — Ambulatory Visit (HOSPITAL_COMMUNITY): Payer: BC Managed Care – PPO | Attending: Cardiology

## 2019-08-31 DIAGNOSIS — R0609 Other forms of dyspnea: Secondary | ICD-10-CM

## 2019-08-31 DIAGNOSIS — I10 Essential (primary) hypertension: Secondary | ICD-10-CM

## 2019-08-31 DIAGNOSIS — I351 Nonrheumatic aortic (valve) insufficiency: Secondary | ICD-10-CM

## 2019-08-31 DIAGNOSIS — R252 Cramp and spasm: Secondary | ICD-10-CM

## 2019-08-31 DIAGNOSIS — I5032 Chronic diastolic (congestive) heart failure: Secondary | ICD-10-CM

## 2019-08-31 DIAGNOSIS — R0600 Dyspnea, unspecified: Secondary | ICD-10-CM | POA: Insufficient documentation

## 2019-08-31 DIAGNOSIS — I77810 Thoracic aortic ectasia: Secondary | ICD-10-CM

## 2019-08-31 MED ORDER — PERFLUTREN LIPID MICROSPHERE
1.0000 mL | INTRAVENOUS | Status: AC | PRN
  Administered 2019-08-31: 1 mL via INTRAVENOUS

## 2019-09-01 ENCOUNTER — Telehealth: Payer: Self-pay | Admitting: *Deleted

## 2019-09-01 LAB — CBC
Hematocrit: 36.6 % (ref 34.0–46.6)
Hemoglobin: 11.7 g/dL (ref 11.1–15.9)
MCH: 24.9 pg — ABNORMAL LOW (ref 26.6–33.0)
MCHC: 32 g/dL (ref 31.5–35.7)
MCV: 78 fL — ABNORMAL LOW (ref 79–97)
Platelets: 253 10*3/uL (ref 150–450)
RBC: 4.69 x10E6/uL (ref 3.77–5.28)
RDW: 15.7 % — ABNORMAL HIGH (ref 11.7–15.4)
WBC: 9.4 10*3/uL (ref 3.4–10.8)

## 2019-09-01 LAB — BASIC METABOLIC PANEL
BUN/Creatinine Ratio: 14 (ref 9–23)
BUN: 10 mg/dL (ref 6–24)
CO2: 25 mmol/L (ref 20–29)
Calcium: 9.2 mg/dL (ref 8.7–10.2)
Chloride: 104 mmol/L (ref 96–106)
Creatinine, Ser: 0.71 mg/dL (ref 0.57–1.00)
GFR calc Af Amer: 114 mL/min/{1.73_m2} (ref >59)
GFR calc non Af Amer: 99 mL/min/{1.73_m2} (ref >59)
Glucose: 115 mg/dL — ABNORMAL HIGH (ref 65–99)
Potassium: 4.3 mmol/L (ref 3.5–5.2)
Sodium: 142 mmol/L (ref 134–144)

## 2019-09-01 LAB — MAGNESIUM: Magnesium: 2 mg/dL (ref 1.6–2.3)

## 2019-09-01 LAB — PRO B NATRIURETIC PEPTIDE: NT-Pro BNP: 173 pg/mL (ref 0–249)

## 2019-09-01 NOTE — Telephone Encounter (Signed)
Call placed to pt re: echocardiogram & lab results.  Left a message for pt to call back.

## 2019-09-01 NOTE — Telephone Encounter (Signed)
-----   Message from Charlie Pitter, [place] sent at 09/01/2019 11:26 AM EST ----- Also has result note on echo.  Please let pt know labs stable except blood sugar is elevated. Hemoglobin level is a little lower than before - MCV level is also lower so there might be a component of iron deficiency - should see her PCP for further monitoring. BNP level is normal but she will have her fluid status assessed in person in office as planned along with BP. Dayna Dunn PA-C

## 2019-09-02 NOTE — Progress Notes (Signed)
Cardiology Office Note:    Date:  09/03/2019   ID:  Jessica Snow, DOB 09/17/1966, MRN [id number]  PCP:  Dorothyann Peng, MD  Cardiologist:  Tobias Alexander, MD  Electrophysiologist:  None   Referring MD: Dorothyann Peng, MD   Chief Complaint  Patient presents with  . Follow-up    shortness of breath    History of Present Illness:    Jessica Snow is a 53 y.o. female with:   Chronic diastolic CHF  Hypertension   Morbid obesity  Asthma   Dilated Ascending thoracic aorta   CTA 10/2018: 39 mm  Echocardiogram 08/2019: 39 mm  Glucose intol  Hyperlipidemia   Coronary CTA 09/2018: Ca score 0  Mild AI  Ms. Castille was last seen 08/30/2019 by Ronie Spies, PA-C via Telemedicine.  She complained of shortness of breath and muscle cramps.  BNP was normal and an echocardiogram demonstrated normal EF.  She returns for follow-up.  She is here alone.  She continues to have issues with shortness of breath with minimal activity.  She sleeps on an incline.  She has not had PND.  But she does describe awakening with what sounds like apnea.  She does have a history of snoring.  She had a sleep test in 2019 that did not show significant sleep apnea.  She has some left-sided chest discomfort.  She describes it as being tender.  She notes this at rest as well as with activity.  She has no association with meals, positional changes.  She has not had pleuritic chest pain.  She has some mild pedal edema.  This seems to be worse when she is standing for prolonged periods of time.  She has a history of left foot fracture.  Her left foot is usually more swollen.  She has not had syncope.   Prior CV studies:   The following studies were reviewed today:  Echocardiogram 08/31/2019 EF 60-65, indeterminate diastolic function, normal RVSF, mild dilation of ascending aorta (39 mm)  Echocardiogram 08/27/2018 Mild focal basal septal hypertrophy, EF 60-65, no RWMA, Gr 2 DD, mild AI, ascending aorta 40 mm  (mildly dilated)  Coronary CTA 11/03/2018 IMPRESSION: No acute extra cardiac abnormality. 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 Ascending aorta upper limits normal in diameter at 3.9 cm.  IMPRESSION: 1. Coronary calcium score of 0. This was 0 percentile for age and sex matched control. 2. Normal coronary origin with left dominance. 3. This is a poor quality study secondary to patient's size and motion. However, there no evidence of CAD in the visualized portions of the coronary arteries. 4. Dilated pulmonary artery measuring 35 mm suggestive of pulmonary hypertension.  Past Medical History:  Diagnosis Date  . Ascending aorta dilatation (HCC)   . Asthma   . Chronic diastolic CHF (congestive heart failure) (HCC)   . Chronic rhinitis   . Cough   . Edema, lower extremity   . GERD (gastroesophageal reflux disease)   . HTN (hypertension)   . Mild aortic insufficiency   . Morbid obesity (HCC)   . Murmur   . Pneumonia   . Pre-diabetes    Surgical Hx: The patient  has a past surgical history that includes Total vaginal hysterectomy (2007).   Current Medications: Current Meds  Medication Sig  . albuterol (PROVENTIL) (2.5 MG/3ML) 0.083% nebulizer solution 1 vial in neb every 4 hours as needed  . albuterol (VENTOLIN HFA) 108 (90 Base) MCG/ACT inhaler TAKE 2 PUFFS BY MOUTH EVERY 4  TO 6 HOURS AS NEEDED  . budesonide-formoterol (SYMBICORT) 80-4.5 MCG/ACT inhaler Inhale 2 puffs into the lungs 2 (two) times daily.  . famotidine (PEPCID) 20 MG tablet One at bedtime  . furosemide (LASIX) 40 MG tablet Take 1 tablet (40 mg total) by mouth daily.  . Liraglutide -Weight Management (SAXENDA) 18 MG/3ML SOPN Inject 0.6 mg into the skin daily.  . montelukast (SINGULAIR) 10 MG tablet Take 1 tablet (10 mg total) by mouth at bedtime.  Marland Kitchen olmesartan (BENICAR) 40 MG tablet Take 40 mg by mouth daily.  . pantoprazole (PROTONIX) 40 MG tablet Take 1 tablet by mouth daily  . Vitamin D,  Ergocalciferol, (DRISDOL) 1.25 MG (50000 UT) CAPS capsule Take 1 capsule (50,000 Units total) by mouth every 7 (seven) days.  . [DISCONTINUED] spironolactone (ALDACTONE) 25 MG tablet Take 0.5 tablets (12.5 mg total) by mouth daily.     Allergies:   Honey bee treatment [bee venom] and Wheat bran   Social History   Tobacco Use  . Smoking status: Former Smoker    Packs/day: 0.25    Years: 10.00    Pack years: 2.50    Types: Cigarettes    Quit date: 01/26/2018    Years since quitting: 1.6  . Smokeless tobacco: Former Network engineer Use Topics  . Alcohol use: No  . Drug use: No     Family Hx: The patient's family history includes Anemia in her father; Diabetes in her mother; Heart disease in her mother; Hypertension in her father and mother; Obesity in her mother; Sleep apnea in her mother.  ROS:   Please see the history of present illness.    Review of Systems  Constitution: Negative for fever.  Gastrointestinal: Negative for melena.  Genitourinary: Negative for hematuria.   All other systems reviewed and are negative.   EKGs/Labs/Other Test Reviewed:    EKG:  EKG is not ordered today.  The ekg ordered today demonstrates N/A  Recent Labs: 04/21/2019: ALT 28; TSH 1.030 08/31/2019: BUN 10; Creatinine, Ser 0.71; Hemoglobin 11.7; Magnesium 2.0; NT-Pro BNP 173; Platelets 253; Potassium 4.3; Sodium 142   Recent Lipid Panel Lab Results  Component Value Date/Time   CHOL 154 05/17/2019 02:02 PM   TRIG 65 05/17/2019 02:02 PM   HDL 55 05/17/2019 02:02 PM   LDLCALC 86 05/17/2019 02:02 PM    Physical Exam:    VS:  BP (!) 118/94   Pulse 77   Ht 5\' 3"  (1.6 m)   Wt 270 lb (122.5 kg)   LMP 12/19/2005   SpO2 99%   BMI 47.83 kg/m     Wt Readings from Last 3 Encounters:  09/03/19 270 lb (122.5 kg)  08/30/19 260 lb (117.9 kg)  07/26/19 262 lb (118.8 kg)     Physical Exam  Constitutional: She is oriented to person, place, and time. She appears well-developed and  well-nourished. No distress.  HENT:  Head: Normocephalic and atraumatic.  Eyes: No scleral icterus.  Neck: No JVD present. No thyromegaly present.  Cardiovascular: Normal rate, regular rhythm and normal heart sounds.  No murmur heard. Pulmonary/Chest: Effort normal and breath sounds normal. She has no rales.  Abdominal: Soft. There is no hepatomegaly.  Musculoskeletal:        General: No edema.  Lymphadenopathy:    She has no cervical adenopathy.  Neurological: She is alert and oriented to person, place, and time.  Skin: Skin is warm and dry.  Psychiatric: She has a normal mood and affect.  ASSESSMENT & PLAN:    1. Shortness of breath I suspect her shortness of breath is multifactorial.  She does have a history of diastolic heart failure as well as asthma.  I believe that her obesity is also playing a role.  Question if she has sleep apnea.  Recent echocardiogram demonstrated normal LV function.  Recent BNP is normal.  She does have occasional chest discomfort.  This seems to be not typical for ischemia.  Coronary CTA recently demonstrated a calcium score of 0 and no clear evidence of CAD.  Oxygen saturation with ambulation the office today:  97-95%.  2. Chronic diastolic CHF (congestive heart failure) (HCC) Volume status appears stable.  Recent echocardiogram with normal EF.  Continue current dose of diuretic.  3. Essential hypertension Diastolic still above goal.  Increase spironolactone to 25 mg daily.  Obtain follow-up BMET in 1 week.  4. Ascending aorta dilatation (HCC) 39 mm on recent echocardiogram.  This will be monitored over time with serial echocardiograms.  5. Morbid obesity due to excess calories (HCC) We discussed different strategies for losing weight.  I will provide her with low-sodium diet information today.  I have asked her to continue different applications such as Weight Watchers or [employer].    6. Snoring She has significant symptoms of sleep apnea.  She has  witnessed snoring as well as apnea.  She has daytime hypersomnolence.  She had a sleep study with neurology last year that did not demonstrate significant sleep apnea.  I will arrange a repeat study at home.  7. Low mean corpuscular volume (MCV) Hemoglobin is still in the normal range.  However, she has some symptoms of cramping.  Question if she may have some restless leg syndrome symptoms.  She may need iron replacement.  I have asked her to follow-up with primary care.   Dispo:  Return in about 3 months (around 12/04/2019) for Routine Follow Up w/ Dr. Delton SeeNelson, (virtual or in-person).   Medication Adjustments/Labs and Tests Ordered: Current medicines are reviewed at length with the patient today.  Concerns regarding medicines are outlined above.  Tests Ordered: Orders Placed This Encounter  Procedures  . Basic Metabolic Panel (BMET)   Medication Changes: Meds ordered this encounter  Medications  . spironolactone (ALDACTONE) 25 MG tablet    Sig: Take 1 tablet (25 mg total) by mouth daily.    Dispense:  90 tablet    Refill:  3    Signed, Tereso NewcomerScott Weaver, PA-C  09/03/2019 1:48 PM    [facility] [address], [place], [place]  [address] Phone: [phone]) [phone]; Fax: [phone]

## 2019-09-02 NOTE — Telephone Encounter (Signed)
Returned pt's call and she has been made aware of her echo / lab results. See result note.

## 2019-09-02 NOTE — Telephone Encounter (Signed)
Patient returning call.

## 2019-09-03 ENCOUNTER — Encounter: Payer: Self-pay | Admitting: Physician Assistant

## 2019-09-03 ENCOUNTER — Ambulatory Visit (INDEPENDENT_AMBULATORY_CARE_PROVIDER_SITE_OTHER): Payer: BC Managed Care – PPO | Admitting: Physician Assistant

## 2019-09-03 ENCOUNTER — Other Ambulatory Visit: Payer: Self-pay

## 2019-09-03 VITALS — BP 118/94 | HR 77 | Ht 63.0 in | Wt 270.0 lb

## 2019-09-03 DIAGNOSIS — I10 Essential (primary) hypertension: Secondary | ICD-10-CM

## 2019-09-03 DIAGNOSIS — R0683 Snoring: Secondary | ICD-10-CM

## 2019-09-03 DIAGNOSIS — R0602 Shortness of breath: Secondary | ICD-10-CM | POA: Diagnosis not present

## 2019-09-03 DIAGNOSIS — I5032 Chronic diastolic (congestive) heart failure: Secondary | ICD-10-CM

## 2019-09-03 DIAGNOSIS — I77810 Thoracic aortic ectasia: Secondary | ICD-10-CM

## 2019-09-03 DIAGNOSIS — R718 Other abnormality of red blood cells: Secondary | ICD-10-CM

## 2019-09-03 MED ORDER — SPIRONOLACTONE 25 MG PO TABS: 25.0000 mg | ORAL_TABLET | Freq: Every day | ORAL | 3 refills | Status: DC

## 2019-09-03 NOTE — Patient Instructions (Signed)
Medication Instructions:   Your physician has recommended you make the following change in your medication:   1) Increase your Spironolactone to 25MG  (full tablet), 1 tablet by mouth once a day  *If you need a refill on your cardiac medications before your next appointment, please call your pharmacy*  Lab Work:  Your physician recommends that you return for lab work on: 09/10/19 at 11:15AM  If you have labs (blood work) drawn today and your tests are completely normal, you will receive your results only by:  [facility] (if you have MyChart) OR  A paper copy in the mail If you have any lab test that is abnormal or we need to change your treatment, we will call you to review the results.  Testing/Procedures:  Your physician has recommended that you have a sleep study. This test records several body functions during sleep, including: brain activity, eye movement, oxygen and carbon dioxide blood levels, heart rate and rhythm, breathing rate and rhythm, the flow of air through your mouth and nose, snoring, body muscle movements, and chest and belly movement. Our sleep study coordinator Gershon Cull will be contacting you to set this up.  Follow-Up:  On 12/20/2019 at 3:40PM with Ena Dawley, MD  Other Instructions  Follow up with your primary care physician for low hemoglobin. Also, you can go to [facility] or [place] for knee high compressions stockings.   Low-Sodium Eating Plan Sodium, which is an element that makes up salt, helps you maintain a healthy balance of fluids in your body. Too much sodium can increase your blood pressure and cause fluid and waste to be held in your body. Your health care provider or dietitian may recommend following this plan if you have high blood pressure (hypertension), kidney disease, liver disease, or heart failure. Eating less sodium can help lower your blood pressure, reduce swelling, and protect your heart, liver, and  kidneys. What are tips for following this plan? General guidelines  Most people on this plan should limit their sodium intake to 1,500-2,000 mg (milligrams) of sodium each day. Reading food labels   The Nutrition Facts label lists the amount of sodium in one serving of the food. If you eat more than one serving, you must multiply the listed amount of sodium by the number of servings.  Choose foods with less than 140 mg of sodium per serving.  Avoid foods with 300 mg of sodium or more per serving. Shopping  Look for lower-sodium products, often labeled as "low-sodium" or "no salt added."  Always check the sodium content even if foods are labeled as "unsalted" or "no salt added".  Buy fresh foods. ? Avoid canned foods and premade or frozen meals. ? Avoid canned, cured, or processed meats  Buy breads that have less than 80 mg of sodium per slice. Cooking  Eat more home-cooked food and less restaurant, buffet, and fast food.  Avoid adding salt when cooking. Use salt-free seasonings or herbs instead of table salt or sea salt. Check with your health care provider or pharmacist before using salt substitutes.  Cook with plant-based oils, such as canola, sunflower, or olive oil. Meal planning  When eating at a restaurant, ask that your food be prepared with less salt or no salt, if possible.  Avoid foods that contain MSG (monosodium glutamate). MSG is sometimes added to [place] food, bouillon, and some canned foods. What foods are recommended? The items listed may not be a complete list. Talk with your  dietitian about what dietary choices are best for you. Grains Low-sodium cereals, including oats, puffed wheat and rice, and shredded wheat. Low-sodium crackers. Unsalted rice. Unsalted pasta. Low-sodium bread. Whole-grain breads and whole-grain pasta. Vegetables Fresh or frozen vegetables. "No salt added" canned vegetables. "No salt added" tomato sauce and paste. Low-sodium or  reduced-sodium tomato and vegetable juice. Fruits Fresh, frozen, or canned fruit. Fruit juice. Meats and other protein foods Fresh or frozen (no salt added) meat, poultry, seafood, and fish. Low-sodium canned tuna and salmon. Unsalted nuts. Dried peas, beans, and lentils without added salt. Unsalted canned beans. Eggs. Unsalted nut butters. Dairy Milk. Soy milk. Cheese that is naturally low in sodium, such as ricotta cheese, fresh mozzarella, or Swiss cheese Low-sodium or reduced-sodium cheese. Cream cheese. Yogurt. Fats and oils Unsalted butter. Unsalted margarine with no trans fat. Vegetable oils such as canola or olive oils. Seasonings and other foods Fresh and dried herbs and spices. Salt-free seasonings. Low-sodium mustard and ketchup. Sodium-free salad dressing. Sodium-free light mayonnaise. Fresh or refrigerated horseradish. Lemon juice. Vinegar. Homemade, reduced-sodium, or low-sodium soups. Unsalted popcorn and pretzels. Low-salt or salt-free chips. What foods are not recommended? The items listed may not be a complete list. Talk with your dietitian about what dietary choices are best for you. Grains Instant hot cereals. Bread stuffing, pancake, and biscuit mixes. Croutons. Seasoned rice or pasta mixes. Noodle soup cups. Boxed or frozen macaroni and cheese. Regular salted crackers. Self-rising flour. Vegetables Sauerkraut, pickled vegetables, and relishes. Olives. [place] fries. Onion rings. Regular canned vegetables (not low-sodium or reduced-sodium). Regular canned tomato sauce and paste (not low-sodium or reduced-sodium). Regular tomato and vegetable juice (not low-sodium or reduced-sodium). Frozen vegetables in sauces. Meats and other protein foods Meat or fish that is salted, canned, smoked, spiced, or pickled. Bacon, ham, sausage, hotdogs, corned beef, chipped beef, packaged lunch meats, salt pork, jerky, pickled herring, anchovies, regular canned tuna, sardines, salted  nuts. Dairy Processed cheese and cheese spreads. Cheese curds. Blue cheese. Feta cheese. String cheese. Regular cottage cheese. Buttermilk. Canned milk. Fats and oils Salted butter. Regular margarine. Ghee. Bacon fat. Seasonings and other foods Onion salt, garlic salt, seasoned salt, table salt, and sea salt. Canned and packaged gravies. Worcestershire sauce. Tartar sauce. Barbecue sauce. Teriyaki sauce. Soy sauce, including reduced-sodium. Steak sauce. Fish sauce. Oyster sauce. Cocktail sauce. Horseradish that you find on the shelf. Regular ketchup and mustard. Meat flavorings and tenderizers. Bouillon cubes. Hot sauce and Tabasco sauce. Premade or packaged marinades. Premade or packaged taco seasonings. Relishes. Regular salad dressings. Salsa. Potato and tortilla chips. Corn chips and puffs. Salted popcorn and pretzels. Canned or dried soups. Pizza. Frozen entrees and pot pies. Summary  Eating less sodium can help lower your blood pressure, reduce swelling, and protect your heart, liver, and kidneys.  Most people on this plan should limit their sodium intake to 1,500-2,000 mg (milligrams) of sodium each day.  Canned, boxed, and frozen foods are high in sodium. Restaurant foods, fast foods, and pizza are also very high in sodium. You also get sodium by adding salt to food.  Try to cook at home, eat more fresh fruits and vegetables, and eat less fast food, canned, processed, or prepared foods. This information is not intended to replace advice given to you by your health care provider. Make sure you discuss any questions you have with your health care provider. Document Released: 04/05/2002 Document Revised: 09/26/2017 Document Reviewed: 10/07/2016 Elsevier Patient Education  2020 [employer].

## 2019-09-04 ENCOUNTER — Telehealth: Payer: Self-pay | Admitting: *Deleted

## 2019-09-04 NOTE — Telephone Encounter (Signed)
-----   Message from Jessica Snow, [place] sent at 09/03/2019  9:24 AM EST ----- Regarding: Sleep study Hi [name], this patient needs a home sleep study for snoring and apnea. Do I need to put a referral in?

## 2019-09-07 ENCOUNTER — Telehealth (INDEPENDENT_AMBULATORY_CARE_PROVIDER_SITE_OTHER): Payer: Self-pay | Admitting: Psychology

## 2019-09-07 NOTE — Telephone Encounter (Signed)
  Office: [phone]  /  Fax: [phone]  Date of Call: September 07, 2019  Time of Call: 3:41pm Provider: Glennie Isle, PsyD  CONTENT: This provider called [facility] to check-in and schedule a follow-up appointment. A HIPAA compliant voicemail was left requesting a call back.   PLAN: No further follow-up planned by this provider.

## 2019-09-10 ENCOUNTER — Telehealth: Payer: Self-pay | Admitting: *Deleted

## 2019-09-10 ENCOUNTER — Other Ambulatory Visit: Payer: Self-pay | Admitting: Physician Assistant

## 2019-09-10 ENCOUNTER — Other Ambulatory Visit: Payer: Self-pay

## 2019-09-10 ENCOUNTER — Other Ambulatory Visit: Payer: BC Managed Care – PPO | Admitting: *Deleted

## 2019-09-10 DIAGNOSIS — I5032 Chronic diastolic (congestive) heart failure: Secondary | ICD-10-CM

## 2019-09-10 DIAGNOSIS — R0683 Snoring: Secondary | ICD-10-CM

## 2019-09-10 DIAGNOSIS — R718 Other abnormality of red blood cells: Secondary | ICD-10-CM

## 2019-09-10 DIAGNOSIS — R0602 Shortness of breath: Secondary | ICD-10-CM

## 2019-09-10 DIAGNOSIS — I10 Essential (primary) hypertension: Secondary | ICD-10-CM

## 2019-09-10 DIAGNOSIS — I77810 Thoracic aortic ectasia: Secondary | ICD-10-CM

## 2019-09-10 LAB — BASIC METABOLIC PANEL
BUN/Creatinine Ratio: 15 (ref 9–23)
BUN: 11 mg/dL (ref 6–24)
CO2: 23 mmol/L (ref 20–29)
Calcium: 9.3 mg/dL (ref 8.7–10.2)
Chloride: 105 mmol/L (ref 96–106)
Creatinine, Ser: 0.74 mg/dL (ref 0.57–1.00)
GFR calc Af Amer: 108 mL/min/{1.73_m2} (ref >59)
GFR calc non Af Amer: 94 mL/min/{1.73_m2} (ref >59)
Glucose: 83 mg/dL (ref 65–99)
Potassium: 4.4 mmol/L (ref 3.5–5.2)
Sodium: 143 mmol/L (ref 134–144)

## 2019-09-10 NOTE — Telephone Encounter (Signed)
[employer] informed me in lab sleep study does not require a PA, however a HST does. I could not do PA for the HST due to them scheduling into the new year. Per Richardson Dopp ok to change order to split night order as long as [employer] will cover. Per phone conversation with Virgie Dad @ [facility] on 09/10/19 no PA is required for in lab study.  Plan does not participate with [employer].

## 2019-09-13 ENCOUNTER — Other Ambulatory Visit: Payer: Self-pay | Admitting: *Deleted

## 2019-09-13 ENCOUNTER — Telehealth: Payer: Self-pay | Admitting: *Deleted

## 2019-09-13 NOTE — Telephone Encounter (Signed)
Patient is scheduled for lab study on 09/29/19. Neeti is scheduled for COVID screening on 11/30 2 pm prior to sleep study. Patient understands her sleep study will be done at [facility] sleep lab. Patient understands she will receive a sleep packet in a week or so. Patient understands to call if she does not receive the sleep packet in a timely manner. Patient agrees with treatment and thanked me for call.

## 2019-09-13 NOTE — Telephone Encounter (Signed)
-----   Message from Lauralee Evener, [place] sent at 09/10/2019  1:53 PM EST ----- Regarding: RE: precert [name] per [facility] no PA required for in lab study. HST requires PA. Which they won't let me do a PA for due to the lab scheduling into the new year. Per scott Weaver ok to schedule in lab study. I put order in. Thanks!  ----- Message ----- From: Freada Bergeron, CMA Sent: 09/04/2019   5:25 PM EST To: [facility] Subject: precert                                         ----- Message ----- From: Mady Haagensen, CMA Sent: 09/03/2019   9:24 AM EST To: Freada Bergeron, CMA Subject: Sleep study                                    Ola Spurr, this patient needs a home sleep study for snoring and apnea. Do I need to put a referral in?

## 2019-09-16 ENCOUNTER — Other Ambulatory Visit: Payer: Self-pay | Admitting: Internal Medicine

## 2019-09-27 ENCOUNTER — Other Ambulatory Visit (HOSPITAL_COMMUNITY)
Admission: RE | Admit: 2019-09-27 | Discharge: 2019-09-27 | Disposition: A | Discharge status: Home / Self Care | Payer: BC Managed Care – PPO | Source: Ambulatory Visit | Attending: Cardiology | Admitting: Cardiology

## 2019-09-27 DIAGNOSIS — Z01812 Encounter for preprocedural laboratory examination: Secondary | ICD-10-CM | POA: Diagnosis present

## 2019-09-27 DIAGNOSIS — Z20828 Contact with and (suspected) exposure to other viral communicable diseases: Secondary | ICD-10-CM | POA: Insufficient documentation

## 2019-09-27 LAB — SARS CORONAVIRUS 2 (TAT 6-24 HRS): SARS Coronavirus 2: NEGATIVE

## 2019-09-28 ENCOUNTER — Ambulatory Visit (INDEPENDENT_AMBULATORY_CARE_PROVIDER_SITE_OTHER): Payer: BC Managed Care – PPO | Admitting: Internal Medicine

## 2019-09-28 ENCOUNTER — Other Ambulatory Visit: Payer: Self-pay

## 2019-09-28 ENCOUNTER — Encounter: Payer: Self-pay | Admitting: Internal Medicine

## 2019-09-28 VITALS — BP 152/84 | HR 77 | Temp 98.3°F | Ht 63.0 in | Wt 269.6 lb

## 2019-09-28 DIAGNOSIS — Z23 Encounter for immunization: Secondary | ICD-10-CM | POA: Diagnosis not present

## 2019-09-28 DIAGNOSIS — Z1211 Encounter for screening for malignant neoplasm of colon: Secondary | ICD-10-CM

## 2019-09-28 DIAGNOSIS — N393 Stress incontinence (female) (male): Secondary | ICD-10-CM

## 2019-09-28 DIAGNOSIS — I110 Hypertensive heart disease with heart failure: Secondary | ICD-10-CM

## 2019-09-28 DIAGNOSIS — Z0000 Encounter for general adult medical examination without abnormal findings: Secondary | ICD-10-CM | POA: Diagnosis not present

## 2019-09-28 DIAGNOSIS — I5032 Chronic diastolic (congestive) heart failure: Secondary | ICD-10-CM | POA: Diagnosis not present

## 2019-09-28 DIAGNOSIS — Z01419 Encounter for gynecological examination (general) (routine) without abnormal findings: Secondary | ICD-10-CM | POA: Diagnosis not present

## 2019-09-28 DIAGNOSIS — K644 Residual hemorrhoidal skin tags: Secondary | ICD-10-CM | POA: Diagnosis not present

## 2019-09-28 LAB — POCT UA - MICROALBUMIN
Albumin/Creatinine Ratio, Urine, POC: 30
Creatinine, POC: 300 mg/dL
Microalbumin Ur, POC: 10 mg/L

## 2019-09-28 LAB — POCT URINALYSIS DIPSTICK
Bilirubin, UA: NEGATIVE
Blood, UA: NEGATIVE
Glucose, UA: NEGATIVE
Ketones, UA: NEGATIVE
Leukocytes, UA: NEGATIVE
Nitrite, UA: NEGATIVE
Protein, UA: NEGATIVE
Spec Grav, UA: 1.02 (ref 1.010–1.025)
Urobilinogen, UA: 0.2 E.U./dL
pH, UA: 7 (ref 5.0–8.0)

## 2019-09-28 LAB — POC HEMOCCULT BLD/STL (OFFICE/1-CARD/DIAGNOSTIC): Fecal Occult Blood, POC: NEGATIVE

## 2019-09-28 MED ORDER — HYDROCORTISONE ACETATE 25 MG RE SUPP: 25.0000 mg | Freq: Two times a day (BID) | RECTAL | 1 refills | Status: DC

## 2019-09-28 MED ORDER — BUDESONIDE-FORMOTEROL FUMARATE 80-4.5 MCG/ACT IN AERO: 2.0000 | INHALATION_SPRAY | Freq: Two times a day (BID) | RESPIRATORY_TRACT | 11 refills | Status: DC

## 2019-09-28 NOTE — Progress Notes (Addendum)
This visit occurred during the SARS-CoV-2 public health emergency.  Safety protocols were in place, including screening questions prior to the visit, additional usage of staff PPE, and extensive cleaning of exam room while observing appropriate contact time as indicated for disinfecting solutions.  Subjective:     Patient ID: Jessica Snow , female    DOB: [birth year] , 53 y.o.   MRN: [id number]   Chief Complaint  Patient presents with  . Annual Exam  . Hypertension    HPI  She is here today for a full physical examination. She is no longer followed by GYN. She is s/p hysterectomy.   Hypertension This is a chronic problem. The current episode started more than 1 year ago. The problem is uncontrolled. Pertinent negatives include no blurred vision, chest pain, palpitations or shortness of breath. Past treatments include diuretics and angiotensin blockers. The current treatment provides moderate improvement. Compliance problems include exercise.      Past Medical History:  Diagnosis Date  . Ascending aorta dilatation (HCC)   . Asthma   . Chronic diastolic CHF (congestive heart failure) (HCC)   . Chronic rhinitis   . Cough   . Edema, lower extremity   . GERD (gastroesophageal reflux disease)   . HTN (hypertension)   . Mild aortic insufficiency   . Morbid obesity (HCC)   . Murmur   . Pneumonia   . Pre-diabetes      Family History  Problem Relation Age of Onset  . Hypertension Mother   . Diabetes Mother   . Heart disease Mother   . Sleep apnea Mother   . Obesity Mother   . Hypertension Father   . Anemia Father      Current Outpatient Medications:  .  albuterol (PROVENTIL) (2.5 MG/3ML) 0.083% nebulizer solution, 1 vial in neb every 4 hours as needed, Disp: , Rfl:  .  albuterol (VENTOLIN HFA) 108 (90 Base) MCG/ACT inhaler, TAKE 2 PUFFS BY MOUTH EVERY 4 TO 6 HOURS AS NEEDED, Disp: 8.5 Inhaler, Rfl: 11 .  famotidine (PEPCID) 20 MG tablet, One at bedtime, Disp: 30 tablet,  Rfl: 11 .  furosemide (LASIX) 40 MG tablet, Take 1 tablet (40 mg total) by mouth daily., Disp: 90 tablet, Rfl: 3 .  montelukast (SINGULAIR) 10 MG tablet, Take 1 tablet (10 mg total) by mouth at bedtime., Disp: 30 tablet, Rfl: 11 .  pantoprazole (PROTONIX) 40 MG tablet, Take 1 tablet by mouth daily, Disp: 180 tablet, Rfl: 0 .  spironolactone (ALDACTONE) 25 MG tablet, Take 1 tablet (25 mg total) by mouth daily., Disp: 90 tablet, Rfl: 3 .  budesonide-formoterol (SYMBICORT) 80-4.5 MCG/ACT inhaler, Inhale 2 puffs into the lungs 2 (two) times daily., Disp: 1 Inhaler, Rfl: 11 .  hydrocortisone (ANUSOL-HC) 25 MG suppository, Place 1 suppository (25 mg total) rectally every 12 (twelve) hours., Disp: 12 suppository, Rfl: 1 .  Liraglutide -Weight Management (SAXENDA) 18 MG/3ML SOPN, Inject 0.6 mg into the skin daily., Disp: , Rfl:  .  olmesartan (BENICAR) 40 MG tablet, TAKE 1 TABLET BY MOUTH EVERY DAY (Patient not taking: Reported on 09/28/2019), Disp: 30 tablet, Rfl: 0 .  Vitamin D, Ergocalciferol, (DRISDOL) 1.25 MG (50000 UT) CAPS capsule, Take 1 capsule (50,000 Units total) by mouth every 7 (seven) days. (Patient not taking: Reported on 09/28/2019), Disp: 4 capsule, Rfl: 0   Allergies  Allergen Reactions  . Honey Bee Treatment [Bee Venom]   . Wheat Bran      Review of Systems  Constitutional: Negative.  HENT: Negative.   Eyes: Negative.  Negative for blurred vision.  Respiratory: Negative.  Negative for shortness of breath.   Cardiovascular: Negative.  Negative for chest pain and palpitations.  Endocrine: Negative.   Genitourinary: Negative.        She c/o stress incontinence - c/o leakage if she coughs/laughs  Musculoskeletal: Negative.   Skin: Negative.   Allergic/Immunologic: Negative.   Neurological: Negative.   Hematological: Negative.   Psychiatric/Behavioral: Negative.   All other systems reviewed and are negative.    Today's Vitals   09/28/19 1444  BP: (!) 152/84  Pulse: 77   Temp: 98.3 F (36.8 C)  TempSrc: Oral  SpO2: 98%  Weight: 269 lb 9.6 oz (122.3 kg)  Height: 5\' 3"  (1.6 m)   Body mass index is 47.76 kg/m.   Objective:  Physical Exam Vitals signs and nursing note reviewed.  Constitutional:      Appearance: Normal appearance. She is obese.  HENT:     Head: Normocephalic and atraumatic.     Right Ear: Tympanic membrane, ear canal and external ear normal.     Left Ear: Tympanic membrane, ear canal and external ear normal.     Nose: Nose normal.     Mouth/Throat:     Mouth: Mucous membranes are moist.     Pharynx: Oropharynx is clear.  Eyes:     Extraocular Movements: Extraocular movements intact.     Conjunctiva/sclera: Conjunctivae normal.     Pupils: Pupils are equal, round, and reactive to light.  Neck:     Musculoskeletal: Normal range of motion and neck supple.  Cardiovascular:     Rate and Rhythm: Normal rate and regular rhythm.     Pulses: Normal pulses.     Heart sounds: Normal heart sounds.  Pulmonary:     Effort: Pulmonary effort is normal.     Breath sounds: Normal breath sounds.  Chest:     Breasts: Tanner Score is 5.        Right: Normal.        Left: Normal.  Abdominal:     General: Bowel sounds are normal. There is distension.     Palpations: Abdomen is soft.     Tenderness: There is no abdominal tenderness. There is no right CVA tenderness, left CVA tenderness, guarding or rebound.     Hernia: There is no hernia in the left inguinal area or right inguinal area.     Comments: Obese, rounded, soft, non-tender. Difficult to assess organomegaly.   Genitourinary:    General: Normal vulva.     Exam position: Lithotomy position.     Pubic Area: No rash.      Tanner stage (genital): 5.     Labia:        Right: No rash, tenderness or lesion.        Left: No rash, tenderness or lesion.      Vagina: Normal.     Uterus: Absent.      Rectum: Guaiac result negative. External hemorrhoid present.     Comments: Absent cervix,  adnexa.  Musculoskeletal: Normal range of motion.  Skin:    General: Skin is warm and dry.  Neurological:     General: No focal deficit present.     Mental Status: She is alert and oriented to person, place, and time.  Psychiatric:        Mood and Affect: Mood normal.        Behavior: Behavior normal.  Assessment And Plan:     1. Routine general medical examination at health care facility  A full exam was performed. Importance of monthly self breast exams was discussed with the patient. PATIENT HAS BEEN ADVISED TO GET 30-45 MINUTES REGULAR EXERCISE NO LESS THAN FOUR TO FIVE DAYS PER WEEK - BOTH WEIGHTBEARING EXERCISES AND AEROBIC ARE RECOMMENDED.  SHE WAS ADVISED TO FOLLOW A HEALTHY DIET WITH AT LEAST SIX FRUITS/VEGGIES PER DAY, DECREASE INTAKE OF RED MEAT, AND TO INCREASE FISH INTAKE TO TWO DAYS PER WEEK.  MEATS/FISH SHOULD NOT BE FRIED, BAKED OR BROILED IS PREFERABLE.  I SUGGEST WEARING SPF 50 SUNSCREEN ON EXPOSED PARTS AND ESPECIALLY WHEN IN THE DIRECT SUNLIGHT FOR AN EXTENDED PERIOD OF TIME.  PLEASE AVOID FAST FOOD RESTAURANTS AND INCREASE YOUR WATER INTAKE.  - Hemoglobin A1c - Liver Profile  2. Gynecologic exam normal  Pelvic exam performed. Stool heme negative.   3. Hypertensive heart disease with chronic diastolic congestive heart failure (HCC)  Uncontrolled. She reports compliance with meds. She is encouraged to comply with dietary recommendations. Also encouraged to incorporate more exercise into her daily routine. Currently managed by Cardiology, previous bp reading was better than today's reading. If elevated at next visit, Jessica consider adding amlodipine to her current med list. EKG performed, NSR w/o acute changes.   - EKG 12-Lead - POCT Urinalysis Dipstick (81002) - POCT UA - Microalbumin  4. External hemorrhoids without complication  She was given rx anusol cream to affected area tid prn.   5. Stress incontinence of urine  She agrees to PT referral for  strengthening of pelvic floor.   - Ambulatory referral to Physical Therapy  6. Need for vaccination  - Flu Vaccine QUAD 6+ mos PF IM (Fluarix Quad PF)      Gwynneth Alimentobyn N Araya Roel, MD    THE PATIENT IS ENCOURAGED TO PRACTICE SOCIAL DISTANCING DUE TO THE COVID-19 PANDEMIC.

## 2019-09-28 NOTE — Patient Instructions (Signed)
Start Calm, magnesium nightly You may get this at [facility]   Health Maintenance, Female Adopting a healthy lifestyle and getting preventive care are important in promoting health and wellness. Ask your health care provider about:  The right schedule for you to have regular tests and exams.  Things you can do on your own to prevent diseases and keep yourself healthy. What should I know about diet, weight, and exercise? Eat a healthy diet   Eat a diet that includes plenty of vegetables, fruits, low-fat dairy products, and lean protein.  Do not eat a lot of foods that are high in solid fats, added sugars, or sodium. Maintain a healthy weight Body mass index (BMI) is used to identify weight problems. It estimates body fat based on height and weight. Your health care provider can help determine your BMI and help you achieve or maintain a healthy weight. Get regular exercise Get regular exercise. This is one of the most important things you can do for your health. Most adults should:  Exercise for at least 150 minutes each week. The exercise should increase your heart rate and make you sweat (moderate-intensity exercise).  Do strengthening exercises at least twice a week. This is in addition to the moderate-intensity exercise.  Spend less time sitting. Even light physical activity can be beneficial. Watch cholesterol and blood lipids Have your blood tested for lipids and cholesterol at 51 years of age, then have this test every 5 years. Have your cholesterol levels checked more often if:  Your lipid or cholesterol levels are high.  You are older than 51 years of age.  You are at high risk for heart disease. What should I know about cancer screening? Depending on your health history and family history, you may need to have cancer screening at various ages. This may include screening for:  Breast cancer.  Cervical cancer.  Colorectal cancer.  Skin cancer.  Lung cancer. What should  I know about heart disease, diabetes, and high blood pressure? Blood pressure and heart disease  High blood pressure causes heart disease and increases the risk of stroke. This is more likely to develop in people who have high blood pressure readings, are of African descent, or are overweight.  Have your blood pressure checked: ? Every 3-5 years if you are 70-62 years of age. ? Every year if you are 13 years old or older. Diabetes Have regular diabetes screenings. This checks your fasting blood sugar level. Have the screening done:  Once every three years after age 56 if you are at a normal weight and have a low risk for diabetes.  More often and at a younger age if you are overweight or have a high risk for diabetes. What should I know about preventing infection? Hepatitis B If you have a higher risk for hepatitis B, you should be screened for this virus. Talk with your health care provider to find out if you are at risk for hepatitis B infection. Hepatitis C Testing is recommended for:  Everyone born from [birth year] through 1963.  Anyone with known risk factors for hepatitis C. Sexually transmitted infections (STIs)  Get screened for STIs, including gonorrhea and chlamydia, if: ? You are sexually active and are younger than 51 years of age. ? You are older than 51 years of age and your health care provider tells you that you are at risk for this type of infection. ? Your sexual activity has changed since you were last screened, and you are at  increased risk for chlamydia or gonorrhea. Ask your health care provider if you are at risk.  Ask your health care provider about whether you are at high risk for HIV. Your health care provider may recommend a prescription medicine to help prevent HIV infection. If you choose to take medicine to prevent HIV, you should first get tested for HIV. You should then be tested every 3 months for as long as you are taking the medicine. Pregnancy  If you are  about to stop having your period (premenopausal) and you may become pregnant, seek counseling before you get pregnant.  Take 400 to 800 micrograms (mcg) of folic acid every day if you become pregnant.  Ask for birth control (contraception) if you want to prevent pregnancy. Osteoporosis and menopause Osteoporosis is a disease in which the bones lose minerals and strength with aging. This can result in bone fractures. If you are 46 years old or older, or if you are at risk for osteoporosis and fractures, ask your health care provider if you should:  Be screened for bone loss.  Take a calcium or vitamin D supplement to lower your risk of fractures.  Be given hormone replacement therapy (HRT) to treat symptoms of menopause. Follow these instructions at home: Lifestyle  Do not use any products that contain nicotine or tobacco, such as cigarettes, e-cigarettes, and chewing tobacco. If you need help quitting, ask your health care provider.  Do not use street drugs.  Do not share needles.  Ask your health care provider for help if you need support or information about quitting drugs. Alcohol use  Do not drink alcohol if: ? Your health care provider tells you not to drink. ? You are pregnant, may be pregnant, or are planning to become pregnant.  If you drink alcohol: ? Limit how much you use to 0-1 drink a day. ? Limit intake if you are breastfeeding.  Be aware of how much alcohol is in your drink. In the [place] one drink equals one 12 oz bottle of beer (355 mL), one 5 oz glass of wine (148 mL), or one 1 oz glass of hard liquor (44 mL). General instructions  Schedule regular health, dental, and eye exams.  Stay current with your vaccines.  Tell your health care provider if: ? You often feel depressed. ? You have ever been abused or do not feel safe at home. Summary  Adopting a healthy lifestyle and getting preventive care are important in promoting health and wellness.  Follow  your health care provider's instructions about healthy diet, exercising, and getting tested or screened for diseases.  Follow your health care provider's instructions on monitoring your cholesterol and blood pressure. This information is not intended to replace advice given to you by your health care provider. Make sure you discuss any questions you have with your health care provider. Document Released: 04/29/2011 Document Revised: 10/07/2018 Document Reviewed: 10/07/2018 Elsevier Patient Education  2020 [employer].

## 2019-09-29 ENCOUNTER — Other Ambulatory Visit: Payer: Self-pay

## 2019-09-29 ENCOUNTER — Ambulatory Visit (HOSPITAL_BASED_OUTPATIENT_CLINIC_OR_DEPARTMENT_OTHER): Payer: BC Managed Care – PPO | Attending: Physician Assistant | Admitting: Cardiology

## 2019-09-29 DIAGNOSIS — R0683 Snoring: Secondary | ICD-10-CM | POA: Diagnosis not present

## 2019-09-29 DIAGNOSIS — R0902 Hypoxemia: Secondary | ICD-10-CM | POA: Insufficient documentation

## 2019-09-29 DIAGNOSIS — I110 Hypertensive heart disease with heart failure: Secondary | ICD-10-CM | POA: Diagnosis not present

## 2019-09-29 DIAGNOSIS — I10 Essential (primary) hypertension: Secondary | ICD-10-CM

## 2019-09-29 DIAGNOSIS — I5032 Chronic diastolic (congestive) heart failure: Secondary | ICD-10-CM | POA: Insufficient documentation

## 2019-09-29 DIAGNOSIS — G4733 Obstructive sleep apnea (adult) (pediatric): Secondary | ICD-10-CM | POA: Insufficient documentation

## 2019-09-29 LAB — HEPATIC FUNCTION PANEL
ALT: 28 IU/L (ref 0–32)
AST: 24 IU/L (ref 0–40)
Albumin: 4.3 g/dL (ref 3.8–4.9)
Alkaline Phosphatase: 95 IU/L (ref 39–117)
Bilirubin Total: 0.3 mg/dL (ref 0.0–1.2)
Bilirubin, Direct: 0.09 mg/dL (ref 0.00–0.40)
Total Protein: 7.3 g/dL (ref 6.0–8.5)

## 2019-09-29 LAB — HEMOGLOBIN A1C
Est. average glucose Bld gHb Est-mCnc: 143 mg/dL
Hgb A1c MFr Bld: 6.6 % — ABNORMAL HIGH (ref 4.8–5.6)

## 2019-09-30 NOTE — Procedures (Signed)
    Patient Name: Jessica Snow, Jessica Snow Date:09/29/2019 Gender: Female D.O.B: Jun 17, 1966 Age (years): 53 Referring Provider: Richardson Dopp Height (inches): 56 Interpreting Physician: Fransico Him MD, ABSM Weight (lbs): 269 RPSGT: Laren Everts BMI: 48 MRN: [id number] Neck Size: 17.50  CLINICAL INFORMATION Sleep Study Type: NPSG  Indication for sleep study: Excessive Daytime Sleepiness, Fatigue, Hypertension, Morning Headaches, Obesity, Re-Evaluation, Sleep walking/talking/parasomnias, Snoring  Epworth Sleepiness Score:  SLEEP STUDY TECHNIQUE As per the AASM Manual for the Scoring of Sleep and Associated Events v2.3 (April 2016) with a hypopnea requiring 4% desaturations.  The channels recorded and monitored were frontal, central and occipital EEG, electrooculogram (EOG), submentalis EMG (chin), nasal and oral airflow, thoracic and abdominal wall motion, anterior tibialis EMG, snore microphone, electrocardiogram, and pulse oximetry.  MEDICATIONS Medications self-administered by patient taken the night of the study : FAMOTIDINE, MONTELUKAST  SLEEP ARCHITECTURE The study was initiated at 10:08:57 PM and ended at 4:54:09 AM.  Sleep onset time was 32.2 minutes and the sleep efficiency was 82.7%. The total sleep time was 335 minutes.  Stage REM latency was 77.0 minutes.  The patient spent 4.8% of the night in stage N1 sleep, 70.0% in stage N2 sleep, 0.3% in stage N3 and 24.9% in REM.  Alpha intrusion was absent.  Supine sleep was 28.51%.  RESPIRATORY PARAMETERS The overall apnea/hypopnea index (AHI) was 14.9 per hour. There were 0 total apneas, including 0 obstructive, 0 central and 0 mixed apneas. There were 83 hypopneas and 27 RERAs.  The AHI during Stage REM sleep was 49.6 per hour.  AHI while supine was 0.6 per hour.  The mean oxygen saturation was 93.4%. The minimum SpO2 during sleep was 80.0%.  moderate snoring was noted during this study.  CARDIAC DATA The 2  lead EKG demonstrated sinus rhythm. The mean heart rate was 66.4 beats per minute. Other EKG findings include: None.  LEG MOVEMENT DATA The total PLMS were 0 with a resulting PLMS index of 0.0. Associated arousal with leg movement index was 0.0 .  IMPRESSIONS - Moderate obstructive sleep apnea occurred during this study (AHI = 14.9/h). - No significant central sleep apnea occurred during this study (CAI = 0.0/h). - Moderate oxygen desaturation was noted during this study (Min O2 = 80.0%). - The patient snored with moderate snoring volume. - No cardiac abnormalities were noted during this study. - Clinically significant periodic limb movements did not occur during sleep. No significant associated arousals.  DIAGNOSIS - Obstructive Sleep Apnea (327.23 [G47.33 ICD-10]) - Nocturnal Hypoxemia (327.26 [G47.36 ICD-10])  RECOMMENDATIONS - Therapeutic CPAP titration to determine optimal pressure required to alleviate sleep disordered breathing. - Avoid alcohol, sedatives and other CNS depressants that may worsen sleep apnea and disrupt normal sleep architecture. - Sleep hygiene should be reviewed to assess factors that may improve sleep quality. - Weight management and regular exercise should be initiated or continued if appropriate.

## 2019-10-01 ENCOUNTER — Telehealth: Payer: Self-pay | Admitting: *Deleted

## 2019-10-01 DIAGNOSIS — R0602 Shortness of breath: Secondary | ICD-10-CM

## 2019-10-01 DIAGNOSIS — I10 Essential (primary) hypertension: Secondary | ICD-10-CM

## 2019-10-01 NOTE — Telephone Encounter (Signed)
Informed patient of sleep study results and patient understanding was verbalized. Patient understands her sleep study showed  they have sleep apnea and recommend CPAP titration. Please set up titration in the sleep lab.    titiration sent to sleep pool   

## 2019-10-01 NOTE — Telephone Encounter (Signed)
-----   Message from Sueanne Margarita, MD sent at 09/30/2019  9:12 AM EST ----- Please let patient know that they have sleep apnea and recommend CPAP titration. Please set up titration in the sleep lab.

## 2019-10-05 ENCOUNTER — Ambulatory Visit: Payer: BC Managed Care – PPO

## 2019-10-05 NOTE — Telephone Encounter (Signed)
Patient is scheduled for lab study on 10/18/19. Pt  is scheduled for COVID screening on 10/15/19 2:45 prior to titration.   Patient understands her sleep study will be done at [facility] sleep lab. Patient understands she will receive a sleep packet in a week or so. Patient understands to call if she does not receive the sleep packet in a timely manner. Patient agrees with treatment and thanked me for call.

## 2019-10-05 NOTE — Addendum Note (Signed)
Addended by: Freada Bergeron on: 10/05/2019 12:24 PM   Modules accepted: Orders

## 2019-10-06 ENCOUNTER — Ambulatory Visit: Payer: Self-pay

## 2019-10-07 ENCOUNTER — Ambulatory Visit: Payer: Self-pay

## 2019-10-07 ENCOUNTER — Ambulatory Visit: Payer: BC Managed Care – PPO

## 2019-10-11 ENCOUNTER — Other Ambulatory Visit: Payer: Self-pay | Admitting: Internal Medicine

## 2019-10-11 ENCOUNTER — Other Ambulatory Visit: Payer: Self-pay | Admitting: Cardiology

## 2019-10-15 ENCOUNTER — Other Ambulatory Visit (HOSPITAL_COMMUNITY)
Admission: RE | Admit: 2019-10-15 | Discharge: 2019-10-15 | Disposition: A | Discharge status: Home / Self Care | Payer: BC Managed Care – PPO | Source: Ambulatory Visit | Attending: Cardiology | Admitting: Cardiology

## 2019-10-15 DIAGNOSIS — Z01812 Encounter for preprocedural laboratory examination: Secondary | ICD-10-CM | POA: Diagnosis present

## 2019-10-15 DIAGNOSIS — Z20828 Contact with and (suspected) exposure to other viral communicable diseases: Secondary | ICD-10-CM | POA: Diagnosis not present

## 2019-10-15 LAB — SARS CORONAVIRUS 2 (TAT 6-24 HRS): SARS Coronavirus 2: NEGATIVE

## 2019-10-18 ENCOUNTER — Ambulatory Visit (HOSPITAL_BASED_OUTPATIENT_CLINIC_OR_DEPARTMENT_OTHER): Payer: BC Managed Care – PPO | Attending: Cardiology | Admitting: Cardiology

## 2019-10-18 ENCOUNTER — Other Ambulatory Visit: Payer: Self-pay

## 2019-10-18 VITALS — Ht 63.0 in | Wt 268.0 lb

## 2019-10-18 DIAGNOSIS — R0602 Shortness of breath: Secondary | ICD-10-CM | POA: Diagnosis present

## 2019-10-18 DIAGNOSIS — I10 Essential (primary) hypertension: Secondary | ICD-10-CM | POA: Insufficient documentation

## 2019-10-18 DIAGNOSIS — Z79899 Other long term (current) drug therapy: Secondary | ICD-10-CM | POA: Insufficient documentation

## 2019-10-18 DIAGNOSIS — G4733 Obstructive sleep apnea (adult) (pediatric): Secondary | ICD-10-CM | POA: Diagnosis not present

## 2019-10-19 ENCOUNTER — Other Ambulatory Visit (HOSPITAL_BASED_OUTPATIENT_CLINIC_OR_DEPARTMENT_OTHER): Payer: Self-pay

## 2019-10-19 DIAGNOSIS — R0602 Shortness of breath: Secondary | ICD-10-CM

## 2019-10-19 DIAGNOSIS — I10 Essential (primary) hypertension: Secondary | ICD-10-CM

## 2019-10-19 NOTE — Procedures (Signed)
    Patient Name: Jessica Snow, Dang Date: 10/18/2019 Gender: Female D.O.B: Dec 01, 1965 Age (years): 44 Referring Provider: Fransico Him MD, ABSM Height (inches): 63 Interpreting Physician: Fransico Him MD, ABSM Weight (lbs): 269 RPSGT: Gwenyth Allegra BMI: 48 MRN: [id number] Neck Size: 17.50  CLINICAL INFORMATION The patient is referred for a CPAP titration to treat sleep apnea.  SLEEP STUDY TECHNIQUE As per the AASM Manual for the Scoring of Sleep and Associated Events v2.3 (April 2016) with a hypopnea requiring 4% desaturations.  The channels recorded and monitored were frontal, central and occipital EEG, electrooculogram (EOG), submentalis EMG (chin), nasal and oral airflow, thoracic and abdominal wall motion, anterior tibialis EMG, snore microphone, electrocardiogram, and pulse oximetry. Continuous positive airway pressure (CPAP) was initiated at the beginning of the study and titrated to treat sleep-disordered breathing.  MEDICATIONS Medications self-administered by patient taken the night of the study : FAMOTIDINE, MONTELUKAST  TECHNICIAN COMMENTS Comments added by technician: NONE Comments added by scorer: N/A  RESPIRATORY PARAMETERS Optimal PAP Pressure (cm): 16  AHI at Optimal Pressure (/hr):6.7 Overall Minimal O2 (%):89.0  Supine % at Optimal Pressure (%):100 Minimal O2 at Optimal Pressure (%): 89.0   SLEEP ARCHITECTURE The study was initiated at 10:10:06 PM and ended at 4:35:13 AM.  Sleep onset time was 3.5 minutes and the sleep efficiency was 92.6%. The total sleep time was 356.5 minutes.  The patient spent 4.9% of the night in stage N1 sleep, 81.3% in stage N2 sleep, 0.0% in stage N3 and 13.7% in REM.Stage REM latency was 75.5 minutes  Wake after sleep onset was 25.2. Alpha intrusion was absent. Supine sleep was 59.47%.  CARDIAC DATA The 2 lead EKG demonstrated sinus rhythm. The mean heart rate was 67.5 beats per minute. Other EKG findings include:  None.  LEG MOVEMENT DATA The total Periodic Limb Movements of Sleep (PLMS) were 0. The PLMS index was 0.0. A PLMS index of <15 is considered normal in adults.  IMPRESSIONS - The optimal PAP pressure was 16 cm of water. - Central sleep apnea was not noted during this titration (CAI = 0.3/h). - Mild oxygen desaturations were observed during this titration (min O2 = 89.0%). - The patient snored with moderate snoring volume during this titration study. - No cardiac abnormalities were observed during this study. - Clinically significant periodic limb movements were not noted during this study. Arousals associated with PLMs were rare.  DIAGNOSIS - Obstructive Sleep Apnea (327.23 [G47.33 ICD-10])  RECOMMENDATIONS - Trial of CPAP therapy on 16 cm H2O with a Small size Fisher&Paykel Full Face Mask Simplus mask and heated humidification. - Avoid alcohol, sedatives and other CNS depressants that may worsen sleep apnea and disrupt normal sleep architecture. - Sleep hygiene should be reviewed to assess factors that may improve sleep quality. - Weight management and regular exercise should be initiated or continued. - Return to Sleep Center for re-evaluation after 10 weeks of therapy  [Electronically signed] 10/19/2019 04:57 PM  Fransico Him MD, ABSM Diplomate, American Board of Sleep Medicine

## 2019-10-20 ENCOUNTER — Telehealth: Payer: Self-pay | Admitting: *Deleted

## 2019-10-20 NOTE — Telephone Encounter (Signed)
Informed patient of sleep study results and patient understanding was verbalized. Patient understands her sleep study showed they had a successful PAP titration and let DME know that orders are in EPIC. Please set up 10 week OV with me.   Upon patient request DME selection is [facility]. Patient understands she will be contacted by [facility] to set up her cpap. Patient understands to call if [facility] does not contact her with new setup in a timely manner. Patient understands they will be called once confirmation has been received from [facility] that they have received their new machine to schedule 10 week follow up appointment.  [facility] notified of new cpap order  Please add to airview Patient was grateful for the call and thanked me.    

## 2019-10-20 NOTE — Telephone Encounter (Signed)
-----   Message from Sueanne Margarita, MD sent at 10/19/2019  5:00 PM EST ----- Please let patient know that they had a successful PAP titration and let DME know that orders are in EPIC.  Please set up 10 week OV with me.

## 2019-10-25 ENCOUNTER — Other Ambulatory Visit: Payer: Self-pay | Admitting: Internal Medicine

## 2019-12-06 NOTE — Telephone Encounter (Signed)
Patient has a 10 week follow up appointment scheduled for 01/04/20. Patient understands she needs to keep this appointment for insurance compliance. Patient was grateful for the call and thanked me.

## 2019-12-20 ENCOUNTER — Encounter: Payer: Self-pay | Admitting: Cardiology

## 2019-12-20 ENCOUNTER — Ambulatory Visit: Payer: BC Managed Care – PPO | Admitting: Cardiology

## 2019-12-20 ENCOUNTER — Other Ambulatory Visit: Payer: Self-pay

## 2019-12-20 VITALS — BP 142/84 | HR 87 | Ht 63.0 in | Wt 273.4 lb

## 2019-12-20 DIAGNOSIS — I77810 Thoracic aortic ectasia: Secondary | ICD-10-CM

## 2019-12-20 DIAGNOSIS — I10 Essential (primary) hypertension: Secondary | ICD-10-CM

## 2019-12-20 DIAGNOSIS — I5032 Chronic diastolic (congestive) heart failure: Secondary | ICD-10-CM

## 2019-12-20 NOTE — Patient Instructions (Signed)
Medication Instructions:   *If you need a refill on your cardiac medications before your next appointment, please call your pharmacy*  Lab Work:  If you have labs (blood work) drawn today and your tests are completely normal, you will receive your results only by: . MyChart Message (if you have MyChart) OR . A paper copy in the mail If you have any lab test that is abnormal or we need to change your treatment, we will call you to review the results.  Follow-Up: At [facility], you and your health needs are our priority.  As part of our continuing mission to provide you with exceptional heart care, we have created designated Provider Care Teams.  These Care Teams include your primary Cardiologist (physician) and Advanced Practice Providers (APPs -  Physician Assistants and Nurse Practitioners) who all work together to provide you with the care you need, when you need it.  Your next appointment:   6 month(s)  The format for your next appointment:   In Person  Provider:   You may see Katarina Nelson, MD or one of the following Advanced Practice Providers on your designated Care Team:    Dayna Dunn, PA-C  Michele Lenze, PA-C 

## 2019-12-20 NOTE — Progress Notes (Signed)
Cardiology Office Note:    Date:  12/21/2019   ID:  Jessica Snow, DOB [birth year], MRN [id number]  PCP:  Glendale Chard, MD  Cardiologist:  Ena Dawley, MD  Electrophysiologist:  None   Referring MD: Glendale Chard, MD   Reason for visit: 3 months follow-up  History of Present Illness:    Jessica Snow is a 54 y.o. female with:   Chronic diastolic CHF  Hypertension   Morbid obesity  Asthma   Dilated Ascending thoracic aorta   CTA 10/2018: 39 mm  Echocardiogram 08/2019: 39 mm  Glucose intol  Hyperlipidemia   Coronary CTA 09/2018: Ca score 0  Mild AI  Ms. Dimalanta was last seen 08/30/2019 by Melina Copa, PA-C via Telemedicine.  She complained of shortness of breath and muscle cramps.  BNP was normal and an echocardiogram demonstrated normal EF.   She continues to have issues with shortness of breath with minimal activity.  She complained of awakening at night concerning for possible sleep apnea.  Also history of snoring. She had a sleep test in 2019 that did not show significant sleep apnea.    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  She denies any lower extremity edema no orthopnea or proximal nocturnal dyspnea.  Prior CV studies:   The following studies were reviewed today:  Echocardiogram 08/31/2019 EF 60-65, indeterminate diastolic function, normal RVSF, mild dilation of ascending aorta (39 mm)  Echocardiogram 08/27/2018 Mild focal basal septal hypertrophy, EF 60-65, no RWMA, Gr 2 DD, mild AI, ascending aorta 40 mm (mildly dilated)  Coronary CTA 11/03/2018 IMPRESSION: No acute extra cardiac abnormality. 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 Ascending aorta upper limits normal in diameter at 3.9  cm.  IMPRESSION: 1. Coronary calcium score of 0. This was 0 percentile for age and sex matched control. 2. Normal coronary origin with left dominance. 3. This is a poor quality study secondary to patient's size and motion. However, there no evidence of CAD in the visualized portions of the coronary arteries. 4. Dilated pulmonary artery measuring 35 mm suggestive of pulmonary hypertension.  Past Medical History:  Diagnosis Date  . Ascending aorta dilatation (HCC)   . Asthma   . Chronic diastolic CHF (congestive heart failure) ([place])   . Chronic rhinitis   . Cough   . Edema, lower extremity   . GERD (gastroesophageal reflux disease)   . HTN (hypertension)   . Mild aortic insufficiency   . Morbid obesity ([place])   . Murmur   . Pneumonia   . Pre-diabetes    Surgical Hx: The patient  has a past surgical history that includes Total vaginal hysterectomy (2007).   Current Medications: Current Meds  Medication Sig  . albuterol (PROVENTIL) (2.5 MG/3ML) 0.083% nebulizer solution 1 vial in neb every 4 hours as needed  . albuterol (VENTOLIN HFA) 108 (90 Base) MCG/ACT inhaler TAKE 2 PUFFS BY MOUTH EVERY 4 TO 6 HOURS AS NEEDED  . budesonide-formoterol (SYMBICORT) 80-4.5 MCG/ACT inhaler Inhale 2 puffs into the lungs 2 (two) times daily.  . famotidine (PEPCID) 20 MG tablet One at bedtime  . hydrocortisone (ANUSOL-HC) 25 MG suppository Place 1 suppository (25 mg total) rectally every 12 (twelve) hours.  . montelukast (SINGULAIR) 10 MG tablet Take 1  tablet (10 mg total) by mouth at bedtime.  Marland Kitchen olmesartan (BENICAR) 40 MG tablet TAKE 1 TABLET BY MOUTH EVERY DAY  . pantoprazole (PROTONIX) 40 MG tablet Take 1 tablet by mouth daily  . Semaglutide,0.25 or 0.5MG /DOS, (OZEMPIC, 0.25 OR 0.5 MG/DOSE,) 2 MG/1.5ML SOPN Inject into the skin once a week.  . spironolactone (ALDACTONE) 25 MG tablet Take 1 tablet (25 mg total) by mouth daily.     Allergies:   Honey bee treatment [bee venom] and Wheat bran    Social History   Tobacco Use  . Smoking status: Former Smoker    Packs/day: 0.25    Years: 10.00    Pack years: 2.50    Types: Cigarettes    Quit date: 01/26/2018    Years since quitting: 1.9  . Smokeless tobacco: Former Engineer, water Use Topics  . Alcohol use: No  . Drug use: No     Family Hx: The patient's family history includes Anemia in her father; Diabetes in her mother; Heart disease in her mother; Hypertension in her father and mother; Obesity in her mother; Sleep apnea in her mother.  ROS:   Please see the history of present illness.    Review of Systems  Constitution: Negative for fever.  Gastrointestinal: Negative for melena.  Genitourinary: Negative for hematuria.   All other systems reviewed and are negative.   EKGs/Labs/Other Test Reviewed:    EKG:  EKG is not ordered today.  The ekg ordered today demonstrates N/A  Recent Labs: 04/21/2019: TSH 1.030 08/31/2019: Hemoglobin 11.7; Magnesium 2.0; NT-Pro BNP 173; Platelets 253 09/10/2019: BUN 11; Creatinine, Ser 0.74; Potassium 4.4; Sodium 143 09/28/2019: ALT 28   Recent Lipid Panel Lab Results  Component Value Date/Time   CHOL 154 05/17/2019 02:02 PM   TRIG 65 05/17/2019 02:02 PM   HDL 55 05/17/2019 02:02 PM   LDLCALC 86 05/17/2019 02:02 PM    Physical Exam:    VS:  BP (!) 142/84   Pulse 87   Ht 5\' 3"  (1.6 m)   Wt 273 lb 6.4 oz (124 kg)   LMP 12/19/2005   SpO2 96%   BMI 48.43 kg/m     Wt Readings from Last 3 Encounters:  12/20/19 273 lb 6.4 oz (124 kg)  10/18/19 268 lb (121.6 kg)  09/29/19 269 lb (122 kg)     Physical Exam  Constitutional: She is oriented to person, place, and time. She appears well-developed and well-nourished. No distress.  HENT:  Head: Normocephalic and atraumatic.  Eyes: No scleral icterus.  Neck: No JVD present. No thyromegaly present.  Cardiovascular: Normal rate, regular rhythm and normal heart sounds.  No murmur heard. Pulmonary/Chest: Effort normal and breath  sounds normal. She has no rales.  Abdominal: Soft. There is no hepatomegaly.  Musculoskeletal:        General: No edema.  Lymphadenopathy:    She has no cervical adenopathy.  Neurological: She is alert and oriented to person, place, and time.  Skin: Skin is warm and dry.  Psychiatric: She has a normal mood and affect.    ASSESSMENT & PLAN:    1. Shortness of breath I suspect her shortness of breath is multifactorial.  She does have a history of diastolic heart failure as well as asthma.  She is carrying extra weight and also might be mildly deconditioned.  Oxygen saturation with ambulation the office today:  97-95%.  She is advised to start exercising up to 30 minutes 5 times a week, starting  with walking and continue to attend school exercise classes.  2. Chronic diastolic CHF (congestive heart failure) (HCC) Volume status appears stable.  Recent echocardiogram with normal EF, recent BNP normal.  Continue current dose of diuretic.  3. Essential hypertension Blood pressure borderline, with initiation of exercise and improvement of diet and avoidance of salt she would achieve goal, continue current dose of olmesartan and furosemide.  4. Ascending aorta dilatation (HCC) 39 mm on recent echocardiogram.  This will be monitored over time with serial echocardiograms.  5. Morbid obesity due to excess calories (HCC) We discussed different strategies for losing weight.  I will provide her with low-sodium diet information today.   6. Snoring She has significant symptoms of sleep apnea.  She was reevaluated by Dr. Mayford Knife that diagnosed her with obstructive sleep apnea and she underwent an trial of CPAP titration.  Dispo: Follow-up in 6 months.  Medication Adjustments/Labs and Tests Ordered: Current medicines are reviewed at length with the patient today.  Concerns regarding medicines are outlined above.  Tests Ordered: No orders of the defined types were placed in this  encounter.  Medication Changes: No orders of the defined types were placed in this encounter.   Signed, Tobias Alexander, MD  12/21/2019 2:20 PM    [facility] [address] [place], [place], [place]  [address] Phone: [phone]; Fax: [phone]

## 2019-12-31 ENCOUNTER — Telehealth: Payer: Self-pay | Admitting: *Deleted

## 2019-12-31 NOTE — Telephone Encounter (Signed)
Virtual Visit Pre-Appointment Phone Call  "(Name), I am calling you today to discuss your upcoming appointment. We are currently trying to limit exposure to the virus that causes COVID-19 by seeing patients at home rather than in the office."  1. "What is the BEST phone number to call the day of the visit?" - include this in appointment notes  2. "Do you have or have access to (through a family member/friend) a smartphone with video capability that we can use for your visit?" a. If yes - list this number in appt notes as "cell" (if different from BEST phone #) and list the appointment type as a VIDEO visit in appointment notes b. If no - list the appointment type as a PHONE visit in appointment notes  3. Confirm consent - "In the setting of the current Covid19 crisis, you are scheduled for a (phone or video) visit with your provider on (date) at (time).  Just as we do with many in-office visits, in order for you to participate in this visit, we must obtain consent.  If you'd like, I can send this to your mychart (if signed up) or email for you to review.  Otherwise, I can obtain your verbal consent now.  All virtual visits are billed to your insurance company just like a normal visit would be.  By agreeing to a virtual visit, we'd like you to understand that the technology does not allow for your provider to perform an examination, and thus may limit your provider's ability to fully assess your condition. If your provider identifies any concerns that need to be evaluated in person, we will make arrangements to do so.  Finally, though the technology is pretty good, we cannot assure that it will always work on either your or our end, and in the setting of a video visit, we may have to convert it to a phone-only visit.  In either situation, we cannot ensure that we have a secure connection.  Are you willing to proceed?" STAFF: Did the patient verbally acknowledge consent to telehealth visit? Document  YES/NO here: YES  4. Advise patient to be prepared - "Two hours prior to your appointment, go ahead and check your blood pressure, pulse, oxygen saturation, and your weight (if you have the equipment to check those) and write them all down. When your visit starts, your provider will ask you for this information. If you have an Apple Watch or Kardia device, please plan to have heart rate information ready on the day of your appointment. Please have a pen and paper handy nearby the day of the visit as well."  5. Give patient instructions for MyChart download to smartphone OR Doximity/Doxy.me as below if video visit (depending on what platform provider is using)  6. Inform patient they will receive a phone call 15 minutes prior to their appointment time (may be from unknown caller ID) so they should be prepared to answer    [place] has been deemed a candidate for a follow-up tele-health visit to limit community exposure during the Covid-19 pandemic. I spoke with the patient via phone to ensure availability of phone/video source, confirm preferred email & phone number, and discuss instructions and expectations.  I reminded Jessica Snow to be prepared with any vital sign and/or heart rhythm information that could potentially be obtained via home monitoring, at the time of her visit. I reminded Mickala Laton Lamoreaux to expect a phone call prior to  her visit.  Latrelle Dodrill, CMA 12/31/2019 6:17 PM   INSTRUCTIONS FOR DOWNLOADING THE MYCHART APP TO SMARTPHONE  - The patient must first make sure to have activated MyChart and know their login information - If Apple, go to [employer] and type in MyChart in the search bar and download the app. If Android, ask patient to go to [employer] and type in [place] in the search bar and download the app. The app is free but as with any other app downloads, their phone may require them to verify saved payment information or  Apple/Android password.  - The patient will need to then log into the app with their MyChart username and password, and select [facility] as their healthcare provider to link the account. When it is time for your visit, go to the MyChart app, find appointments, and click Begin Video Visit. Be sure to Select Allow for your device to access the Microphone and Camera for your visit. You will then be connected, and your provider will be with you shortly.  **If they have any issues connecting, or need assistance please contact MyChart service desk [phone])**  **If using a computer, in order to ensure the best quality for their visit they will need to use either of the following Internet Browsers: [employer] or Google Chrome**  IF USING DOXIMITY or DOXY.ME - The patient will receive a link just prior to their visit by text.     FULL LENGTH CONSENT FOR TELE-HEALTH VISIT   I hereby voluntarily request, consent and authorize [facility] and its employed or contracted physicians, physician assistants, nurse practitioners or other licensed health care professionals (the Practitioner), to provide me with telemedicine health care services (the "Services") as deemed necessary by the treating Practitioner. I acknowledge and consent to receive the Services by the Practitioner via telemedicine. I understand that the telemedicine visit will involve communicating with the Practitioner through live audiovisual communication technology and the disclosure of certain medical information by electronic transmission. I acknowledge that I have been given the opportunity to request an in-person assessment or other available alternative prior to the telemedicine visit and am voluntarily participating in the telemedicine visit.  I understand that I have the right to withhold or withdraw my consent to the use of telemedicine in the course of my care at any time, without affecting my right to future care  or treatment, and that the Practitioner or I may terminate the telemedicine visit at any time. I understand that I have the right to inspect all information obtained and/or recorded in the course of the telemedicine visit and may receive copies of available information for a reasonable fee.  I understand that some of the potential risks of receiving the Services via telemedicine include:  Marland Kitchen Delay or interruption in medical evaluation due to technological equipment failure or disruption; . Information transmitted may not be sufficient (e.g. poor resolution of images) to allow for appropriate medical decision making by the Practitioner; and/or  . In rare instances, security protocols could fail, causing a breach of personal health information.  Furthermore, I acknowledge that it is my responsibility to provide information about my medical history, conditions and care that is complete and accurate to the best of my ability. I acknowledge that Practitioner's advice, recommendations, and/or decision may be based on factors not within their control, such as incomplete or inaccurate data provided by me or distortions of diagnostic images or specimens that may result from electronic transmissions.  I understand that the practice of medicine is not an exact science and that Practitioner makes no warranties or guarantees regarding treatment outcomes. I acknowledge that I will receive a copy of this consent concurrently upon execution via email to the email address I last provided but may also request a printed copy by calling the office of [facility].    I understand that my insurance will be billed for this visit.   I have read or had this consent read to me. . I understand the contents of this consent, which adequately explains the benefits and risks of the Services being provided via telemedicine.  . I have been provided ample opportunity to ask questions regarding this consent and the Services and have had  my questions answered to my satisfaction. . I give my informed consent for the services to be provided through the use of telemedicine in my medical care  By participating in this telemedicine visit I agree to the above.

## 2020-01-04 ENCOUNTER — Telehealth (INDEPENDENT_AMBULATORY_CARE_PROVIDER_SITE_OTHER): Payer: BC Managed Care – PPO | Admitting: Cardiology

## 2020-01-04 ENCOUNTER — Other Ambulatory Visit: Payer: Self-pay

## 2020-01-04 ENCOUNTER — Encounter: Payer: Self-pay | Admitting: Cardiology

## 2020-01-04 VITALS — Ht 63.0 in | Wt 271.0 lb

## 2020-01-04 DIAGNOSIS — I10 Essential (primary) hypertension: Secondary | ICD-10-CM | POA: Diagnosis not present

## 2020-01-04 DIAGNOSIS — Z9989 Dependence on other enabling machines and devices: Secondary | ICD-10-CM

## 2020-01-04 DIAGNOSIS — G4733 Obstructive sleep apnea (adult) (pediatric): Secondary | ICD-10-CM

## 2020-01-04 NOTE — Progress Notes (Signed)
Virtual Visit via Telehpone Note   This visit type was conducted due to national recommendations for restrictions regarding the COVID-19 Pandemic (e.g. social distancing) in an effort to limit this patient's exposure and mitigate transmission in our community.  Due to her co-morbid illnesses, this patient is at least at moderate risk for complications without adequate follow up.  This format is felt to be most appropriate for this patient at this time.  All issues noted in this document were discussed and addressed.  A limited physical exam was performed with this format.  Please refer to the patient's chart for her consent to telehealth for [facility].   Evaluation Performed:  Follow-up visit  This visit type was conducted due to national recommendations for restrictions regarding the COVID-19 Pandemic (e.g. social distancing).  This format is felt to be most appropriate for this patient at this time.  All issues noted in this document were discussed and addressed.  No physical exam was performed (except for noted visual exam findings with Video Visits).  Please refer to the patient's chart (MyChart message for video visits and phone note for telephone visits) for the patient's consent to telehealth for [facility].  Date:  01/04/2020   ID:  LASHAYA KIENITZ, DOB 09-Dec-1965, MRN [id number]  Patient Location:  Home  Provider location:   [place]  PCP:  Dorothyann Peng, MD  Cardiologist:  Tobias Alexander, MD  Sleep Medicine:  Armanda Magic, MD Electrophysiologist:  None   Chief Complaint:  OSA  History of Present Illness:    Jessica Snow is a 54 y.o. female who presents via audio/video conferencing for a telehealth visit today.    This is a 54yo female with a hx of excessive daytime sleepiness, poor quality sleep,  fatigue, HTN, morning headaches, obesity and having to catnap during the day. She was referred for sleep study by Tereso Newcomer, PA.  She underwent PSG showing  moderate OSA with an AHI of 14.9/hr and nocturnal hypoxemia with lowest O2 sat of 80%.  She underwent CPAP titration to 16cm H2O and is now here for followup.    She is doing well with her CPAP device and thinks that she has gotten used to it.  She tolerates the mask and feels the pressure is adequate.  Since going on CPAP she feels rested in the am and has no significant daytime sleepiness.  She denies any significant mouth or nasal dryness or nasal congestion.  She does not think that he snores.    The patient does not have symptoms concerning for COVID-19 infection (fever, chills, cough, or new shortness of breath).   Prior CV studies:   The following studies were reviewed today:  PAP compliance download, PSG study and CPAP titration  Past Medical History:  Diagnosis Date  . Ascending aorta dilatation (HCC)   . Asthma   . Chronic diastolic CHF (congestive heart failure) (HCC)   . Chronic rhinitis   . Cough   . Edema, lower extremity   . GERD (gastroesophageal reflux disease)   . HTN (hypertension)   . Mild aortic insufficiency   . Morbid obesity (HCC)   . Murmur   . Pneumonia   . Pre-diabetes    Past Surgical History:  Procedure Laterality Date  . TOTAL VAGINAL HYSTERECTOMY  2007     Current Meds  Medication Sig  . albuterol (PROVENTIL) (2.5 MG/3ML) 0.083% nebulizer solution 1 vial in neb every 4 hours as needed  . albuterol (VENTOLIN HFA)  108 (90 Base) MCG/ACT inhaler TAKE 2 PUFFS BY MOUTH EVERY 4 TO 6 HOURS AS NEEDED  . budesonide-formoterol (SYMBICORT) 80-4.5 MCG/ACT inhaler Inhale 2 puffs into the lungs 2 (two) times daily.  . famotidine (PEPCID) 20 MG tablet One at bedtime  . montelukast (SINGULAIR) 10 MG tablet Take 1 tablet (10 mg total) by mouth at bedtime.  Marland Kitchen olmesartan (BENICAR) 40 MG tablet TAKE 1 TABLET BY MOUTH EVERY DAY  . pantoprazole (PROTONIX) 40 MG tablet Take 1 tablet by mouth daily  . Semaglutide,0.25 or 0.5MG /DOS, (OZEMPIC, 0.25 OR 0.5 MG/DOSE,) 2  MG/1.5ML SOPN Inject into the skin once a week.  . spironolactone (ALDACTONE) 25 MG tablet Take 1 tablet (25 mg total) by mouth daily.     Allergies:   Honey bee treatment [bee venom] and Wheat bran   Social History   Tobacco Use  . Smoking status: Former Smoker    Packs/day: 0.25    Years: 10.00    Pack years: 2.50    Types: Cigarettes    Quit date: 01/26/2018    Years since quitting: 1.9  . Smokeless tobacco: Former Engineer, water Use Topics  . Alcohol use: No  . Drug use: No     Family Hx: The patient's family history includes Anemia in her father; Diabetes in her mother; Heart disease in her mother; Hypertension in her father and mother; Obesity in her mother; Sleep apnea in her mother.  ROS:   Please see the history of present illness.     All other systems reviewed and are negative.   Labs/Other Tests and Data Reviewed:    Recent Labs: 04/21/2019: TSH 1.030 08/31/2019: Hemoglobin 11.7; Magnesium 2.0; NT-Pro BNP 173; Platelets 253 09/10/2019: BUN 11; Creatinine, Ser 0.74; Potassium 4.4; Sodium 143 09/28/2019: ALT 28   Recent Lipid Panel Lab Results  Component Value Date/Time   CHOL 154 05/17/2019 02:02 PM   TRIG 65 05/17/2019 02:02 PM   HDL 55 05/17/2019 02:02 PM   LDLCALC 86 05/17/2019 02:02 PM    Wt Readings from Last 3 Encounters:  01/04/20 271 lb (122.9 kg)  12/20/19 273 lb 6.4 oz (124 kg)  10/18/19 268 lb (121.6 kg)     Objective:    Vital Signs:  Ht 5\' 3"  (1.6 m)   Wt 271 lb (122.9 kg)   LMP 12/19/2005   BMI 48.01 kg/m    ASSESSMENT & PLAN:    1.  OSA - The pathophysiology of obstructive sleep apnea , it's cardiovascular consequences & modes of treatment including CPAP were discused with the patient in detail & they evidenced understanding.  The patient is tolerating PAP therapy well without any problems. The PAP download was reviewed today and showed an AHI of 0.4/hr on 16 cm H2O with 30% compliance in using more than 4 hours nightly.  The  patient has been using and benefiting from PAP use and will continue to benefit from therapy.  Her compliance has been low because she has been having problems with her allergies and has been coughing a night.  She also has had some problems with her electricity.  I encouraged her to be more compliant with her device or insurance will make her turn it back in.   2.  HTN -continue Olmesartan 40mg  daily and spiro 25mg  daily  3.  Morbid Obesity -I have encouraged her to get into a routine exercise program and cut back on carbs and portions.   COVID-19 Education: The signs and symptoms of COVID-19  were discussed with the patient and how to seek care for testing (follow up with PCP or arrange E-visit).  The importance of social distancing was discussed today.  Patient Risk:   After full review of this patient's clinical status, I feel that they are at least moderate risk at this time.  Time:   Today, I have spent 20 minutes on telemedicine discussing medical problems including OSA, HTN, Obeisty and reviewing patient's chart including PSG, CPAP titration and PAP compliance download on [facility] from DME.  Medication Adjustments/Labs and Tests Ordered: Current medicines are reviewed at length with the patient today.  Concerns regarding medicines are outlined above.  Tests Ordered: No orders of the defined types were placed in this encounter.  Medication Changes: No orders of the defined types were placed in this encounter.   Disposition:  Follow up in 1 year(s)  Signed, Fransico Him, MD  01/04/2020 3:14 PM    [facility]

## 2020-01-04 NOTE — Patient Instructions (Addendum)

## 2020-01-11 ENCOUNTER — Other Ambulatory Visit: Payer: Self-pay | Admitting: Cardiology

## 2020-01-27 ENCOUNTER — Ambulatory Visit: Payer: BC Managed Care – PPO | Admitting: Internal Medicine

## 2020-02-01 ENCOUNTER — Ambulatory Visit (INDEPENDENT_AMBULATORY_CARE_PROVIDER_SITE_OTHER): Payer: BC Managed Care – PPO | Admitting: Internal Medicine

## 2020-02-01 ENCOUNTER — Other Ambulatory Visit: Payer: Self-pay

## 2020-02-01 ENCOUNTER — Encounter: Payer: Self-pay | Admitting: Internal Medicine

## 2020-02-01 VITALS — BP 144/84 | HR 68 | Temp 98.3°F | Ht 63.0 in | Wt 270.0 lb

## 2020-02-01 DIAGNOSIS — E119 Type 2 diabetes mellitus without complications: Secondary | ICD-10-CM | POA: Diagnosis not present

## 2020-02-01 DIAGNOSIS — I5032 Chronic diastolic (congestive) heart failure: Secondary | ICD-10-CM | POA: Diagnosis not present

## 2020-02-01 DIAGNOSIS — I110 Hypertensive heart disease with heart failure: Secondary | ICD-10-CM | POA: Diagnosis not present

## 2020-02-01 DIAGNOSIS — J302 Other seasonal allergic rhinitis: Secondary | ICD-10-CM

## 2020-02-01 DIAGNOSIS — Z6841 Body Mass Index (BMI) 40.0 and over, adult: Secondary | ICD-10-CM

## 2020-02-01 DIAGNOSIS — J45991 Cough variant asthma: Secondary | ICD-10-CM | POA: Diagnosis not present

## 2020-02-01 DIAGNOSIS — E1169 Type 2 diabetes mellitus with other specified complication: Secondary | ICD-10-CM | POA: Insufficient documentation

## 2020-02-01 NOTE — Progress Notes (Signed)
This visit occurred during the SARS-CoV-2 public health emergency.  Safety protocols were in place, including screening questions prior to the visit, additional usage of staff PPE, and extensive cleaning of exam room while observing appropriate contact time as indicated for disinfecting solutions.  Subjective:     Patient ID: Jessica Snow , female    DOB: 1966/05/08 , 54 y.o.   MRN: [id number]   Chief Complaint  Patient presents with  . Hypertension    HPI  She reports today for BP check.  She reports compliance with meds. She has started Noom to help with weight loss. She likes the homework assignments that are given to her. She is no longer going to [facility].   Hypertension This is a chronic problem. The current episode started more than 1 year ago. The problem has been gradually improving since onset. The problem is uncontrolled. Pertinent negatives include no blurred vision, chest pain, palpitations or shortness of breath. Past treatments include angiotensin blockers and diuretics. The current treatment provides mild improvement. Compliance problems include exercise.      Past Medical History:  Diagnosis Date  . Ascending aorta dilatation (HCC)   . Asthma   . Chronic diastolic CHF (congestive heart failure) ([place])   . Chronic rhinitis   . Cough   . Edema, lower extremity   . GERD (gastroesophageal reflux disease)   . HTN (hypertension)   . Mild aortic insufficiency   . Morbid obesity ([place])   . Murmur   . Pneumonia   . Pre-diabetes      Family History  Problem Relation Age of Onset  . Hypertension Mother   . Diabetes Mother   . Heart disease Mother   . Sleep apnea Mother   . Obesity Mother   . Hypertension Father   . Anemia Father      Current Outpatient Medications:  .  albuterol (PROVENTIL) (2.5 MG/3ML) 0.083% nebulizer solution, 1 vial in neb every 4 hours as needed, Disp: , Rfl:  .  albuterol (VENTOLIN HFA) 108 (90 Base) MCG/ACT inhaler, TAKE 2 PUFFS BY  MOUTH EVERY 4 TO 6 HOURS AS NEEDED, Disp: 8.5 Inhaler, Rfl: 11 .  budesonide-formoterol (SYMBICORT) 80-4.5 MCG/ACT inhaler, Inhale 2 puffs into the lungs 2 (two) times daily., Disp: 1 Inhaler, Rfl: 11 .  famotidine (PEPCID) 20 MG tablet, One at bedtime, Disp: 30 tablet, Rfl: 11 .  montelukast (SINGULAIR) 10 MG tablet, Take 1 tablet (10 mg total) by mouth at bedtime., Disp: 30 tablet, Rfl: 11 .  olmesartan (BENICAR) 40 MG tablet, TAKE 1 TABLET BY MOUTH EVERY DAY, Disp: 90 tablet, Rfl: 1 .  pantoprazole (PROTONIX) 40 MG tablet, Take 1 tablet by mouth daily, Disp: 180 tablet, Rfl: 0 .  Semaglutide,0.25 or 0.5MG/DOS, (OZEMPIC, 0.25 OR 0.5 MG/DOSE,) 2 MG/1.5ML SOPN, Inject into the skin once a week., Disp: , Rfl:  .  spironolactone (ALDACTONE) 25 MG tablet, Take 1 tablet (25 mg total) by mouth daily., Disp: 90 tablet, Rfl: 3 .  hydrocortisone (ANUSOL-HC) 25 MG suppository, Place 1 suppository (25 mg total) rectally every 12 (twelve) hours. (Patient not taking: Reported on 01/04/2020), Disp: 12 suppository, Rfl: 1   Allergies  Allergen Reactions  . Honey Bee Treatment [Bee Venom]   . Wheat Bran      Review of Systems  Constitutional: Negative.   Eyes: Negative for blurred vision.  Respiratory: Positive for cough. Negative for shortness of breath.        She has been having allergy/asthma  issues since being exposed to pollen  Cardiovascular: Negative.  Negative for chest pain and palpitations.  Gastrointestinal: Negative.   Neurological: Negative.   Psychiatric/Behavioral: Negative.      Today's Vitals   02/01/20 1422  BP: (!) 144/84  Pulse: 68  Temp: 98.3 F (36.8 C)  Weight: 270 lb (122.5 kg)  Height: 5' 3" (1.6 m)   Body mass index is 47.83 kg/m.   Wt Readings from Last 3 Encounters:  02/01/20 270 lb (122.5 kg)  01/04/20 271 lb (122.9 kg)  12/20/19 273 lb 6.4 oz (124 kg)     Objective:  Physical Exam Vitals and nursing note reviewed.  Constitutional:      Appearance:  Normal appearance. She is obese.  HENT:     Head: Normocephalic and atraumatic.  Cardiovascular:     Rate and Rhythm: Normal rate and regular rhythm.     Heart sounds: Normal heart sounds.  Pulmonary:     Effort: Pulmonary effort is normal.     Breath sounds: Normal breath sounds.  Skin:    General: Skin is warm.  Neurological:     General: No focal deficit present.     Mental Status: She is alert.  Psychiatric:        Mood and Affect: Mood normal.        Behavior: Behavior normal.         Assessment And Plan:     1. Hypertensive heart disease with chronic diastolic congestive heart failure (HCC)  Chronic, uncontrolled. She reports compliance with meds. She is encouraged to avoid adding salt to her foods, and to avoid packaged foods which tend to be high in sodium.   2. Type 2 diabetes mellitus without complication, without long-term current use of insulin (HCC)  Chronic. Importance of regular exercise was discussed with the patient. I will check labs as listed below. I do plan on increasing Ozempic.   - Hemoglobin A1c - BMP8+EGFR  3. Cough variant asthma  Chronic, fairly stable. However, this is a difficult season for her. She is encouraged to wear mask even when outdoors in hopes this will decrease her exposure to pollen.   - Ambulatory referral to Allergy  4. Seasonal allergies  She would like to have formal allergy testing. I will refer her to Allergy for further evaluation.   - Ambulatory referral to Allergy  5. Class 3 severe obesity due to excess calories with serious comorbidity and body mass index (BMI) of 45.0 to 49.9 in adult (HCC)  BMI 47.  She is encouraged to aim for at least 150 minutes of moderate exercise per week.    [name], MD    THE PATIENT IS ENCOURAGED TO PRACTICE SOCIAL DISTANCING DUE TO THE COVID-19 PANDEMIC.

## 2020-02-01 NOTE — Patient Instructions (Addendum)
Hibiscus tea - drink one cup daily  Diabetes Mellitus and Exercise Exercising regularly is important for your overall health, especially when you have diabetes (diabetes mellitus). Exercising is not only about losing weight. It has many other health benefits, such as increasing muscle strength and bone density and reducing body fat and stress. This leads to improved fitness, flexibility, and endurance, all of which result in better overall health. Exercise has additional benefits for people with diabetes, including:  Reducing appetite.  Helping to lower and control blood glucose.  Lowering blood pressure.  Helping to control amounts of fatty substances (lipids) in the blood, such as cholesterol and triglycerides.  Helping the body to respond better to insulin (improving insulin sensitivity).  Reducing how much insulin the body needs.  Decreasing the risk for heart disease by: ? Lowering cholesterol and triglyceride levels. ? Increasing the levels of good cholesterol. ? Lowering blood glucose levels. What is my activity plan? Your health care provider or certified diabetes educator can help you make a plan for the type and frequency of exercise (activity plan) that works for you. Make sure that you:  Do at least 150 minutes of moderate-intensity or vigorous-intensity exercise each week. This could be brisk walking, biking, or water aerobics. ? Do stretching and strength exercises, such as yoga or weightlifting, at least 2 times a week. ? Spread out your activity over at least 3 days of the week.  Get some form of physical activity every day. ? Do not go more than 2 days in a row without some kind of physical activity. ? Avoid being inactive for more than 30 minutes at a time. Take frequent breaks to walk or stretch.  Choose a type of exercise or activity that you enjoy, and set realistic goals.  Start slowly, and gradually increase the intensity of your exercise over time. What  do I need to know about managing my diabetes?   Check your blood glucose before and after exercising. ? If your blood glucose is 240 mg/dL (73.4 mmol/L) or higher before you exercise, check your urine for ketones. If you have ketones in your urine, do not exercise until your blood glucose returns to normal. ? If your blood glucose is 100 mg/dL (5.6 mmol/L) or lower, eat a snack containing 15-20 grams of carbohydrate. Check your blood glucose 15 minutes after the snack to make sure that your level is above 100 mg/dL (5.6 mmol/L) before you start your exercise.  Know the symptoms of low blood glucose (hypoglycemia) and how to treat it. Your risk for hypoglycemia increases during and after exercise. Common symptoms of hypoglycemia can include: ? Hunger. ? Anxiety. ? Sweating and feeling clammy. ? Confusion. ? Dizziness or feeling light-headed. ? Increased heart rate or palpitations. ? Blurry vision. ? Tingling or numbness around the mouth, lips, or tongue. ? Tremors or shakes. ? Irritability.  Keep a rapid-acting carbohydrate snack available before, during, and after exercise to help prevent or treat hypoglycemia.  Avoid injecting insulin into areas of the body that are going to be exercised. For example, avoid injecting insulin into: ? The arms, when playing tennis. ? The legs, when jogging.  Keep records of your exercise habits. Doing this can help you and your health care provider adjust your diabetes management plan as needed. Write down: ? Food that you eat before and after you exercise. ? Blood glucose levels before and after you exercise. ? The type and amount of exercise you have done. ? When your  insulin is expected to peak, if you use insulin. Avoid exercising at times when your insulin is peaking.  When you start a new exercise or activity, work with your health care provider to make sure the activity is safe for you, and to adjust your insulin, medicines, or food intake as  needed.  Drink plenty of water while you exercise to prevent dehydration or heat stroke. Drink enough fluid to keep your urine clear or pale yellow. Summary  Exercising regularly is important for your overall health, especially when you have diabetes (diabetes mellitus).  Exercising has many health benefits, such as increasing muscle strength and bone density and reducing body fat and stress.  Your health care provider or certified diabetes educator can help you make a plan for the type and frequency of exercise (activity plan) that works for you.  When you start a new exercise or activity, work with your health care provider to make sure the activity is safe for you, and to adjust your insulin, medicines, or food intake as needed. This information is not intended to replace advice given to you by your health care provider. Make sure you discuss any questions you have with your health care provider. Document Revised: 05/08/2017 Document Reviewed: 03/25/2016 Elsevier Patient Education  2020 [employer].

## 2020-02-02 LAB — BMP8+EGFR
BUN/Creatinine Ratio: 12 (ref 9–23)
BUN: 9 mg/dL (ref 6–24)
CO2: 23 mmol/L (ref 20–29)
Calcium: 9.5 mg/dL (ref 8.7–10.2)
Chloride: 106 mmol/L (ref 96–106)
Creatinine, Ser: 0.75 mg/dL (ref 0.57–1.00)
GFR calc Af Amer: 107 mL/min/{1.73_m2} (ref >59)
GFR calc non Af Amer: 93 mL/min/{1.73_m2} (ref >59)
Glucose: 75 mg/dL (ref 65–99)
Potassium: 3.8 mmol/L (ref 3.5–5.2)
Sodium: 143 mmol/L (ref 134–144)

## 2020-02-02 LAB — HEMOGLOBIN A1C
Est. average glucose Bld gHb Est-mCnc: 140 mg/dL
Hgb A1c MFr Bld: 6.5 % — ABNORMAL HIGH (ref 4.8–5.6)

## 2020-02-16 ENCOUNTER — Other Ambulatory Visit: Payer: Self-pay

## 2020-02-16 MED ORDER — OZEMPIC (0.25 OR 0.5 MG/DOSE) 2 MG/1.5ML ~~LOC~~ SOPN: 1.0000 mg | PEN_INJECTOR | SUBCUTANEOUS | 3 refills | Status: DC

## 2020-03-02 ENCOUNTER — Other Ambulatory Visit: Payer: Self-pay

## 2020-03-02 ENCOUNTER — Ambulatory Visit (INDEPENDENT_AMBULATORY_CARE_PROVIDER_SITE_OTHER): Payer: BC Managed Care – PPO | Admitting: Allergy

## 2020-03-02 ENCOUNTER — Encounter: Payer: Self-pay | Admitting: Allergy

## 2020-03-02 VITALS — BP 138/84 | HR 87 | Temp 98.8°F | Resp 17 | Ht 62.7 in | Wt 267.8 lb

## 2020-03-02 DIAGNOSIS — J3089 Other allergic rhinitis: Secondary | ICD-10-CM

## 2020-03-02 DIAGNOSIS — J45991 Cough variant asthma: Secondary | ICD-10-CM | POA: Diagnosis not present

## 2020-03-02 DIAGNOSIS — H1013 Acute atopic conjunctivitis, bilateral: Secondary | ICD-10-CM | POA: Diagnosis not present

## 2020-03-02 DIAGNOSIS — T7800XA Anaphylactic reaction due to unspecified food, initial encounter: Secondary | ICD-10-CM

## 2020-03-02 MED ORDER — AZELASTINE-FLUTICASONE 137-50 MCG/ACT NA SUSP: 1.0000 | Freq: Two times a day (BID) | NASAL | 3 refills | Status: DC

## 2020-03-02 MED ORDER — EPINEPHRINE 0.3 MG/0.3ML IJ SOAJ: 0.3000 mg | Freq: Once | INTRAMUSCULAR | 2 refills | Status: AC

## 2020-03-02 MED ORDER — BUDESONIDE-FORMOTEROL FUMARATE 160-4.5 MCG/ACT IN AERO: 2.0000 | INHALATION_SPRAY | Freq: Two times a day (BID) | RESPIRATORY_TRACT | 3 refills | Status: DC

## 2020-03-02 MED ORDER — LEVOCETIRIZINE DIHYDROCHLORIDE 5 MG PO TABS: 5.0000 mg | ORAL_TABLET | Freq: Every evening | ORAL | 5 refills | Status: DC

## 2020-03-02 NOTE — Progress Notes (Signed)
New Patient Note  RE: Jessica Snow MRN: [id number] DOB: 12/06/1965 Date of Office Visit: 03/02/2020  Referring provider: Glendale Chard, MD Primary care provider: Glendale Chard, MD  Chief Complaint: coughing  History of present illness: Jessica Snow is a 54 y.o. female presenting today for consultation for cough variant asthma.  She states she has been having a cough for years (about 3 years).  She stopped smoking in 2019 and feels the coughing is worse now.   Symptoms year-round and worse in evening/night.    She states she coughs throughout the day however can have coughing attacks that's situational like going in a freezer section. She states she has had coughing spells and coughs so hard that she has fractured a rib. She also reports wheezing and that family members have told her she is wheezing.  She takes symbicort 2 puffs twice a day and has been using for the past couple years. She takes singulair at night.  She has both albuterol inhaler and nebulizer.  She states she has not used her nebulizer in past year.  Albuterol inhaler use is 2-3 days per week for cough.  She has been treated with prednisone, several courses in past year.  She has tried [employer] as well.   She does report a lot of runny nose and throat clearing worse in the mornings.  Also with itchy/watery eyes, ear itch, throat itch.  She does feel like the cough is worse when allergies are worse. Has used allegra.  When allegra doesn't work will take benadryl.  Has also used claritin without much benefit.  She uses flonase as well.   She reports she scratches a lot of night.  She has scratched to the point of breaking the skin that does leave a mark.  Denies seeing hives/welts.  She has changed detergents and soaps that are scent and dye free.  Does not use dryer sheets.   She reports using lotions after bathing but sometimes that is not enough.  She is not sure if it is something she is eating or putting on  that triggering it.    She has seen Dr. Melvyn Novas in pulmonary and has yearly visits.   No history of eczema.    She had food allergy test in 2019 performed that showed low IgE levels to honey and wheat.  She states when she has had wheat she notes tickle in the throat and cough.   She does report having more itching and cough after having BBQ chicken made with honey.     Review of systems: Review of Systems  Constitutional: Negative.   HENT:       See HPI  Eyes:       See HPI  Respiratory:       See HPI  Cardiovascular: Negative.   Gastrointestinal: Negative.   Musculoskeletal: Negative.   Skin: Positive for itching. Negative for rash.  Neurological: Negative.     All other systems negative unless noted above in HPI  Past medical history: Past Medical History:  Diagnosis Date  . Ascending aorta dilatation (HCC)   . Asthma   . Chronic diastolic CHF (congestive heart failure) ([place])   . Chronic rhinitis   . Cough   . Edema, lower extremity   . GERD (gastroesophageal reflux disease)   . HTN (hypertension)   . Mild aortic insufficiency   . Morbid obesity ([place])   . Murmur   . Pneumonia   . Pre-diabetes  Past surgical history: Past Surgical History:  Procedure Laterality Date  . ANKLE RECONSTRUCTION    . TONSILLECTOMY    . TOTAL VAGINAL HYSTERECTOMY  2007    Family history:  Family History  Problem Relation Age of Onset  . Hypertension Mother   . Diabetes Mother   . Heart disease Mother   . Sleep apnea Mother   . Obesity Mother   . Hypertension Father   . Anemia Father     Social history: Lives in a home with carpeting with electric heating and a heat pump and central cooling.  No pets in the home.  No concern for water damage, mildew averages in the home. Occupational History  . Occupation: Consulting civil engineer for [employer]  Tobacco Use  . Smoking status: Former Smoker    Packs/day: 0.25    Years: 10.00    Pack years: 2.50    Types: Cigarettes    Quit  date: 01/26/2018    Years since quitting: 2.0  . Smokeless tobacco: Former Systems developer    Medication List: Current Outpatient Medications  Medication Sig Dispense Refill  . albuterol (PROVENTIL) (2.5 MG/3ML) 0.083% nebulizer solution 1 vial in neb every 4 hours as needed    . albuterol (VENTOLIN HFA) 108 (90 Base) MCG/ACT inhaler TAKE 2 PUFFS BY MOUTH EVERY 4 TO 6 HOURS AS NEEDED 8.5 Inhaler 11  . budesonide-formoterol (SYMBICORT) 80-4.5 MCG/ACT inhaler Inhale 2 puffs into the lungs 2 (two) times daily. 1 Inhaler 11  . famotidine (PEPCID) 20 MG tablet One at bedtime 30 tablet 11  . montelukast (SINGULAIR) 10 MG tablet Take 1 tablet (10 mg total) by mouth at bedtime. 30 tablet 11  . olmesartan (BENICAR) 40 MG tablet TAKE 1 TABLET BY MOUTH EVERY DAY 90 tablet 1  . pantoprazole (PROTONIX) 40 MG tablet Take 1 tablet by mouth daily 180 tablet 0  . Semaglutide,0.25 or 0.'5MG'$ /DOS, (OZEMPIC, 0.25 OR 0.5 MG/DOSE,) 2 MG/1.5ML SOPN Inject 1 mg into the skin once a week. 3 mL 3  . spironolactone (ALDACTONE) 25 MG tablet Take 1 tablet (25 mg total) by mouth daily. 90 tablet 3   No current facility-administered medications for this visit.    Known medication allergies: Allergies  Allergen Reactions  . Honey Bee Treatment [Bee Venom]   . Wheat Bran      Physical examination: Blood pressure 138/84, pulse 87, temperature 98.8 F (37.1 C), temperature source Temporal, resp. rate 17, height 5' 2.7" (1.593 m), weight 267 lb 12.8 oz (121.5 kg), last menstrual period 12/19/2005, SpO2 97 %.  General: Alert, interactive, in no acute distress. HEENT: PERRLA, TMs pearly gray, turbinates moderately edematous with clear discharge, post-pharynx non erythematous. Neck: Supple without lymphadenopathy. Lungs: Mildly decreased breath sounds with expiratory wheezing of LUL. {no increased work of breathing. CV: Normal S1, S2 without murmurs. Abdomen: Nondistended, nontender. Skin: Warm and dry, without lesions or  rashes. Extremities:  No clubbing, cyanosis or edema. Neuro:   Grossly intact.  Diagnositics/Labs: Labs:  Component     Latest Ref Rng & Units 09/29/2018  F020-IgE Almond     Class 0 kU/L <0.10  Allergen Apple, IgE     Class 0 kU/L <0.10  F261-IgE Asparagus     Class 0 kU/L <0.10  F096-IgE Avocado     Class 0 kU/L <0.10  Allergen Banana IgE     Class 0 kU/L <0.10  Allergen Barley IgE     Class 0 kU/L <0.10  Basil     Class 0  kU/L <0.10  F278-IgE [place] ([place])     Class 0 kU/L <0.10  Allergen Green Bean IgE     Class 0 kU/L <0.10  [place] Bean IgE     Class 0 kU/L <0.10  White Bean IgE     Class 0 kU/L <0.10  Beef IgE     Class 0 kU/L <0.10  Red Beet     Class 0 kU/L <0.10  Allergen Blueberry IgE     Class 0 kU/L <0.10  Allergen Broccoli     Class 0 kU/L <0.10  Allergen Cabbage IgE     Class 0 kU/L <0.10  Allergen Melon IgE     Class 0 kU/L <0.10  Allergen Carrot IgE     Class 0 kU/L <0.10  F078-IgE Casein     Class 0 kU/L <0.10  F202-IgE Cashew Nut     Class 0 kU/L <0.10  Allergen Cauliflower IgE     Class 0 kU/L <0.10  Allergen Celery IgE     Class 0 kU/L <0.10  F081-IgE Cheese, Cheddar Type     Class 0 kU/L <0.10  Chicken IgE     Class 0 kU/L <0.10  Allergen Cinnamon IgE     Class 0 kU/L <0.10  Clam IgE     Class 0 kU/L <0.10  Chocolate/Cacao IgE     Class 0 kU/L <0.10  Allergen Coconut IgE     Class 0 kU/L <0.10  Codfish IgE     Class 0 kU/L <0.10  Coffee     Class 0 kU/L <0.10  Allergen Corn, IgE     Class 0 kU/L <0.10  F023-IgE Crab     Class 0 kU/L <0.10  Allergen Cucumber IgE     Class 0 kU/L <0.10  F077-IgE Beta Lactoglobulin     Class 0 kU/L <0.10  F262-IgE Eggplant     Class 0 kU/L <0.10  Egg White IgE     Class 0 kU/L <0.10  IgE Egg (Yolk)     Class 0 kU/L <1.61  Allergen Garlic IgE     Class 0 kU/L <0.10  Allergen Ginger IgE     Class 0 kU/L <0.10  Allergen Gluten IgE     Class 0 kU/L <0.10  Allergen Grape IgE      Class 0 kU/L <0.10  Allergen Grapefruit IgE     Class 0 kU/L <0.10  F247-IgE Honey     Class 0/I kU/L 0.17 (A)  Allergen Lamb IgE     Class 0 kU/L <0.10  Lemon     Class 0 kU/L <0.10  Allergen Lime IgE     Class 0 kU/L <0.10  Allergen Lettuce IgE     Class 0 kU/L <0.10  F080-IgE Lobster     Class 0 kU/L <0.10  Malt     Class 0 kU/L <0.10  F076-IgE Alpha Lactalbumin     Class 0 kU/L <0.10  F300-IgE Goat's Milk     Class 0 kU/L <0.10  Mushroom IgE     Class 0 kU/L <0.10  F089-IgE Mustard     Class 0 kU/L <0.10  Allergen Oat IgE     Class 0 kU/L <0.10  F342-IgE Olive, Black     Class 0 kU/L <0.10  Allergen Onion IgE     Class 0 kU/L <0.10  Orange     Class 0 kU/L <0.10  F283-IgE Oregano     Class 0 kU/L <0.10  Allergen Green Pea IgE  Class 0 kU/L <0.10  Allergen, Peach f95     Class 0 kU/L <0.10  Peanut IgE     Class 0 kU/L <0.10  Allergen Pear IgE     Class 0 kU/L <0.10  Allergen Black Pepper IgE     Class 0 kU/L <0.10  F279-IgE Chili Pepper     Class 0 kU/L <0.10  Allergen Green Bell Pepper IgE     Class 0 kU/L <0.10  Pineapple IgE     Class 0 kU/L <0.10  Pork IgE     Class 0 kU/L <0.10  Allergen Sweet Potato IgE     Class 0 kU/L <0.10  Allergen Potato, White IgE     Class 0 kU/L <0.10  Allergen Rice IgE     Class 0 kU/L <0.10  C074-IgE Gelatin     Class 0 kU/L <0.10  F343-IgE Raspberry     Class 0 kU/L <0.10  Kidney Bean IgE     Class 0 kU/L <0.10  Hops     Class 0 kU/L <0.10  Cranberry IgE     Class 0 kU/L <0.10  F265-IgE Cumin     Class 0 kU/L <0.10  Vanilla     Class 0 kU/L <0.10  Rye IgE     Class 0 kU/L <0.10  Allergen Salmon IgE     Class 0 kU/L <0.10  Scallop IgE     Class 0 kU/L <0.10  Sesame Seed IgE     Class 0 kU/L <0.10  Shrimp IgE     Class 0 kU/L <0.10  Soybean IgE     Class 0 kU/L <0.10  F214-IgE Spinach     Class 0 kU/L <0.10  Pumpkin IgE     Class 0 kU/L <0.10  Allergen Strawberry IgE     Class 0 kU/L <0.10   F242-IgE Bing Cherry     Class 0 kU/L <0.10  F222-IgE Tea     Class 0 kU/L <0.10  Allergen Tomato, IgE     Class 0 kU/L <0.10  Tuna     Class 0 kU/L <0.10  Allergen [place] IgE     Class 0 kU/L <0.10  Walnut IgE     Class 0 kU/L <0.10  Allergen Watermelon IgE     Class 0 kU/L <0.10  Wheat IgE     Class 0/I kU/L 0.11 (A)  Whey     Class 0 kU/L <0.10  F045-IgE Yeast     Class 0 kU/L <0.10    Spirometry: FEV1: 1.1L 50%, FVC: 1.61L 59% predicted.  S/p albuterol use FEV1 increased to 1.27L 58% which is a 15% increase; this is significant  Allergy testing: environmental allergy skin prick positive to [facility] and helminothosporium.  72 panel food allergy skin prick testing is negative.  Allergy testing results were read and interpreted by provider, documented by clinical staff.   Assessment and plan:   Cough variant asthma  - increase to Symbicort 16mg 2 puffs twice a day with spacer device  - have access to albuterol inhaler 2 puffs every 4-6 hours as needed for cough/wheeze/shortness of breath/chest tightness.  May use 15-20 minutes prior to activity.   Monitor frequency of use.    - continue Singulair '10mg'$  daily at bedtime  Asthma control goals:   Full participation in all desired activities (may need albuterol before activity)  Albuterol use two time or less a week on average (not counting use with activity)  Cough interfering with sleep two  time or less a month  Oral steroids no more than once a year  No hospitalizations  Allergic rhinitis with conjunctivitis  - environmental allergy skin testing is positive to tree pollen and mold  - allergen avoidance measures discussed/handouts provided  - trial Xyzal '5mg'$  daily as needed.  This is a long-acting antihistamine that may be more effective than Allergra and Claritin.   - continue singulair as above  - trial dymista 1 spray each nostril twice a day.  This is a combination nasal spray with Flonase + Astelin (nasal  antihistamine).  This helps with both nasal congestion and drainage.   - recommend use of nasal saline rinses to to help keep nose/sinuses flushed out of any pollen, germs, particles.  Use rinse with either distilled water or boil water and bring to room temperate.  Breathe thru your mouth while performing rinse.  Provided with rinse kit.    Food allergy vs intolerance  - you have had serum IgE detectable levels to wheat and honey however they are low  - skin testing for food allergens is negative to all foods  - continue avoidance of honey and wheat in the diet  - have access to self-injectable epinephrine (Epipen or AuviQ) 0.'3mg'$  at all times  - follow emergency action plan in case of allergic reaction  Follow-up 3 months or sooner if needed  I appreciate the opportunity to take part in Verna's care. Please do not hesitate to contact me with questions.  Sincerely,   Prudy Feeler, MD Allergy/Immunology [facility] [place] [facility] [place]

## 2020-03-02 NOTE — Patient Instructions (Addendum)
Cough variant asthma  - increase to Symbicort 126mg 2 puffs twice a day with spacer device  - have access to albuterol inhaler 2 puffs every 4-6 hours as needed for cough/wheeze/shortness of breath/chest tightness.  May use 15-20 minutes prior to activity.   Monitor frequency of use.    - continue Singulair '10mg'$  daily at bedtime  Asthma control goals:   Full participation in all desired activities (may need albuterol before activity)  Albuterol use two time or less a week on average (not counting use with activity)  Cough interfering with sleep two time or less a month  Oral steroids no more than once a year  No hospitalizations  Allergies   - environmental allergy skin testing is positive to tree pollen and mold  - allergen avoidance measures discussed/handouts provided  - trial Xyzal '5mg'$  daily as needed.  This is a long-acting antihistamine that may be more effective than Allergra and Claritin.   - continue singulair as above  - trial dymista 1 spray each nostril twice a day.  This is a combination nasal spray with Flonase + Astelin (nasal antihistamine).  This helps with both nasal congestion and drainage.   - recommend use of nasal saline rinses to to help keep nose/sinuses flushed out of any pollen, germs, particles.  Use rinse with either distilled water or boil water and bring to room temperate.  Breathe thru your mouth while performing rinse.  Provided with rinse kit.    Food allergy vs intolerance  - you have had serum IgE detectable levels to wheat and honey however they are low  - skin testing for food allergens is negative to all foods  - continue avoidance of honey and wheat in the diet  - have access to self-injectable epinephrine (Epipen or AuviQ) 0.'3mg'$  at all times  - follow emergency action plan in case of allergic reaction  Follow-up 3 months or sooner if needed

## 2020-03-28 ENCOUNTER — Ambulatory Visit: Payer: BC Managed Care – PPO | Admitting: Internal Medicine

## 2020-03-30 ENCOUNTER — Ambulatory Visit: Payer: BC Managed Care – PPO | Admitting: Internal Medicine

## 2020-04-04 ENCOUNTER — Other Ambulatory Visit: Payer: Self-pay

## 2020-04-04 ENCOUNTER — Ambulatory Visit (INDEPENDENT_AMBULATORY_CARE_PROVIDER_SITE_OTHER): Payer: BC Managed Care – PPO | Admitting: Internal Medicine

## 2020-04-04 ENCOUNTER — Encounter: Payer: Self-pay | Admitting: Internal Medicine

## 2020-04-04 VITALS — BP 120/86 | HR 79 | Temp 98.3°F | Ht 62.7 in | Wt 266.8 lb

## 2020-04-04 DIAGNOSIS — I5032 Chronic diastolic (congestive) heart failure: Secondary | ICD-10-CM

## 2020-04-04 DIAGNOSIS — E7800 Pure hypercholesterolemia, unspecified: Secondary | ICD-10-CM | POA: Diagnosis not present

## 2020-04-04 DIAGNOSIS — Z6841 Body Mass Index (BMI) 40.0 and over, adult: Secondary | ICD-10-CM

## 2020-04-04 DIAGNOSIS — I110 Hypertensive heart disease with heart failure: Secondary | ICD-10-CM

## 2020-04-04 DIAGNOSIS — E119 Type 2 diabetes mellitus without complications: Secondary | ICD-10-CM | POA: Diagnosis not present

## 2020-04-04 MED ORDER — OZEMPIC (1 MG/DOSE) 2 MG/1.5ML ~~LOC~~ SOPN: 1.0000 mg | PEN_INJECTOR | SUBCUTANEOUS | 5 refills | Status: DC

## 2020-04-04 NOTE — Patient Instructions (Signed)

## 2020-04-05 ENCOUNTER — Other Ambulatory Visit: Payer: Self-pay | Admitting: Allergy

## 2020-04-08 NOTE — Progress Notes (Signed)
This visit occurred during the SARS-CoV-2 public health emergency.  Safety protocols were in place, including screening questions prior to the visit, additional usage of staff PPE, and extensive cleaning of exam room while observing appropriate contact time as indicated for disinfecting solutions.  Subjective:     Patient ID: Jessica Snow , female    DOB: 1966-05-04 , 54 y.o.   MRN: [id number]   Chief Complaint  Patient presents with  . Hypertension    HPI  She presents today for BP check. She reports compliance with meds.   Hypertension This is a chronic problem. The current episode started more than 1 year ago. The problem has been gradually improving since onset. The problem is uncontrolled. Pertinent negatives include no blurred vision, chest pain, palpitations or shortness of breath. Past treatments include angiotensin blockers and diuretics. The current treatment provides mild improvement. Compliance problems include exercise.      Past Medical History:  Diagnosis Date  . Ascending aorta dilatation (HCC)   . Asthma   . Chronic diastolic CHF (congestive heart failure) (HCC)   . Chronic rhinitis   . Cough   . Edema, lower extremity   . GERD (gastroesophageal reflux disease)   . HTN (hypertension)   . Mild aortic insufficiency   . Morbid obesity (HCC)   . Murmur   . Pneumonia   . Pre-diabetes      Family History  Problem Relation Age of Onset  . Hypertension Mother   . Diabetes Mother   . Heart disease Mother   . Sleep apnea Mother   . Obesity Mother   . Hypertension Father   . Anemia Father      Current Outpatient Medications:  .  albuterol (PROVENTIL) (2.5 MG/3ML) 0.083% nebulizer solution, 1 vial in neb every 4 hours as needed, Disp: , Rfl:  .  albuterol (VENTOLIN HFA) 108 (90 Base) MCG/ACT inhaler, TAKE 2 PUFFS BY MOUTH EVERY 4 TO 6 HOURS AS NEEDED, Disp: 8.5 Inhaler, Rfl: 11 .  Azelastine-Fluticasone 137-50 MCG/ACT SUSP, Place 1 spray into the nose 2  (two) times daily., Disp: 23 g, Rfl: 3 .  budesonide-formoterol (SYMBICORT) 160-4.5 MCG/ACT inhaler, Inhale 2 puffs into the lungs 2 (two) times daily., Disp: 1 Inhaler, Rfl: 3 .  famotidine (PEPCID) 20 MG tablet, One at bedtime, Disp: 30 tablet, Rfl: 11 .  levocetirizine (XYZAL) 5 MG tablet, Take 1 tablet (5 mg total) by mouth every evening., Disp: 30 tablet, Rfl: 5 .  montelukast (SINGULAIR) 10 MG tablet, Take 1 tablet (10 mg total) by mouth at bedtime., Disp: 30 tablet, Rfl: 11 .  olmesartan (BENICAR) 40 MG tablet, TAKE 1 TABLET BY MOUTH EVERY DAY, Disp: 90 tablet, Rfl: 1 .  pantoprazole (PROTONIX) 40 MG tablet, Take 1 tablet by mouth daily, Disp: 180 tablet, Rfl: 0 .  Semaglutide, 1 MG/DOSE, (OZEMPIC, 1 MG/DOSE,) 2 MG/1.5ML SOPN, Inject 0.75 mLs (1 mg total) into the skin once a week., Disp: 1 pen, Rfl: 5 .  spironolactone (ALDACTONE) 25 MG tablet, Take 1 tablet (25 mg total) by mouth daily., Disp: 90 tablet, Rfl: 3   Allergies  Allergen Reactions  . Honey Bee Treatment [Bee Venom]   . Wheat Bran      Review of Systems  Constitutional: Negative.   Eyes: Negative for blurred vision.  Respiratory: Negative.  Negative for shortness of breath.   Cardiovascular: Negative.  Negative for chest pain and palpitations.  Gastrointestinal: Negative.   Neurological: Negative.   Psychiatric/Behavioral: Negative.  Today's Vitals   04/04/20 1443  BP: 120/86  Pulse: 79  Temp: 98.3 F (36.8 C)  TempSrc: Oral  SpO2: 97%  Weight: 266 lb 12.8 oz (121 kg)  Height: 5' 2.7" (1.593 m)   Body mass index is 47.71 kg/m.   Objective:  Physical Exam Vitals and nursing note reviewed.  Constitutional:      Appearance: Normal appearance. She is obese.  HENT:     Head: Normocephalic and atraumatic.  Cardiovascular:     Rate and Rhythm: Normal rate and regular rhythm.     Heart sounds: Normal heart sounds.  Pulmonary:     Effort: Pulmonary effort is normal.     Breath sounds: Normal breath  sounds.  Skin:    General: Skin is warm.  Neurological:     General: No focal deficit present.     Mental Status: She is alert.  Psychiatric:        Mood and Affect: Mood normal.        Behavior: Behavior normal.         Assessment And Plan:     1. Hypertensive heart disease with chronic diastolic congestive heart failure (HCC)  Chronic, well controlled. She will continue with current meds. She is encouraged to avoid adding salt to her foods.   2. Type 2 diabetes mellitus without complication, without long-term current use of insulin ([place])  She agrees to increase dose of Ozempic. She will inject 0.5mg  PLUS 0.25mg  ONCE weekly for the next two weeks, then increase to 1mg  once weekly. She verbally understands her treatment plan.   3. Pure hypercholesterolemia  LDL 86 in July 2020. Pt advised goal LDL is less than 70. I will recheck at her next visit. She is encouraged to avoid fried foods and to incorporate more exercise into her daily routine. I do plan to start statin therapy once GLP-1 therapy is optimized.   4. Class 3 severe obesity due to excess calories with serious comorbidity and body mass index (BMI) of 45.0 to 49.9 in adult (HCC)  BMI 47. I hope to transition her to [facility] once samples are available. This will address both her diabetes and obesity. Additionally, ,she may benefit from enrollment in [facility] program.    [name], MD    THE PATIENT IS ENCOURAGED TO PRACTICE SOCIAL DISTANCING DUE TO THE COVID-19 PANDEMIC.

## 2020-04-12 ENCOUNTER — Other Ambulatory Visit: Payer: Self-pay

## 2020-04-12 MED ORDER — BUDESONIDE-FORMOTEROL FUMARATE 160-4.5 MCG/ACT IN AERO: 2.0000 | INHALATION_SPRAY | Freq: Two times a day (BID) | RESPIRATORY_TRACT | 3 refills | Status: DC

## 2020-05-31 ENCOUNTER — Ambulatory Visit: Payer: BC Managed Care – PPO | Admitting: Internal Medicine

## 2020-06-02 ENCOUNTER — Ambulatory Visit: Payer: BC Managed Care – PPO | Admitting: Allergy

## 2020-06-05 ENCOUNTER — Ambulatory Visit: Payer: BC Managed Care – PPO | Admitting: Internal Medicine

## 2020-06-12 ENCOUNTER — Ambulatory Visit (INDEPENDENT_AMBULATORY_CARE_PROVIDER_SITE_OTHER): Payer: BC Managed Care – PPO | Admitting: Internal Medicine

## 2020-06-12 ENCOUNTER — Encounter: Payer: Self-pay | Admitting: Internal Medicine

## 2020-06-12 ENCOUNTER — Other Ambulatory Visit: Payer: Self-pay

## 2020-06-12 VITALS — BP 130/84 | HR 70 | Temp 97.9°F | Ht 62.7 in | Wt 268.2 lb

## 2020-06-12 DIAGNOSIS — I110 Hypertensive heart disease with heart failure: Secondary | ICD-10-CM | POA: Diagnosis not present

## 2020-06-12 DIAGNOSIS — Z6841 Body Mass Index (BMI) 40.0 and over, adult: Secondary | ICD-10-CM

## 2020-06-12 DIAGNOSIS — I5032 Chronic diastolic (congestive) heart failure: Secondary | ICD-10-CM

## 2020-06-12 DIAGNOSIS — E119 Type 2 diabetes mellitus without complications: Secondary | ICD-10-CM | POA: Diagnosis not present

## 2020-06-12 DIAGNOSIS — Z1159 Encounter for screening for other viral diseases: Secondary | ICD-10-CM

## 2020-06-12 DIAGNOSIS — Z23 Encounter for immunization: Secondary | ICD-10-CM

## 2020-06-12 DIAGNOSIS — L304 Erythema intertrigo: Secondary | ICD-10-CM | POA: Diagnosis not present

## 2020-06-12 DIAGNOSIS — Z1231 Encounter for screening mammogram for malignant neoplasm of breast: Secondary | ICD-10-CM

## 2020-06-12 MED ORDER — NYSTATIN 100000 UNIT/GM EX CREA: 1.0000 "application " | TOPICAL_CREAM | Freq: Two times a day (BID) | CUTANEOUS | 2 refills | Status: DC

## 2020-06-12 MED ORDER — WEGOVY 1.7 MG/0.75ML ~~LOC~~ SOAJ: 1.7000 mg | SUBCUTANEOUS | 0 refills | Status: DC

## 2020-06-12 MED ORDER — FLUCONAZOLE 100 MG PO TABS
100.0000 mg | ORAL_TABLET | Freq: Every day | ORAL | 0 refills | Status: AC
Start: 2020-06-12 — End: 2020-06-19

## 2020-06-12 MED ORDER — NYSTATIN 100000 UNIT/GM EX POWD
1.0000 | Freq: Three times a day (TID) | CUTANEOUS | 0 refills | Status: DC
Start: 2020-06-12 — End: 2022-07-22

## 2020-06-12 NOTE — Progress Notes (Signed)
I,Katawbba Wiggins,acting as a Education administrator for [name], MD.,have documented all relevant documentation on the behalf of [name], MD,as directed by  [name], MD while in the presence of [name], MD.  This visit occurred during the SARS-CoV-2 public health emergency.  Safety protocols were in place, including screening questions prior to the visit, additional usage of staff PPE, and extensive cleaning of exam room while observing appropriate contact time as indicated for disinfecting solutions.  Subjective:     Patient ID: Jessica Snow , female    DOB: 09-Aug-1966 , 54 y.o.   MRN: [id number]   Chief Complaint  Patient presents with  . Diabetes  . Hypertension    HPI  The patient is here today for a follow-up on diabetes and HTN. She reports compliance with meds. Admits she is not exercising as much as she should.   Diabetes She presents for her follow-up diabetic visit. She has type 2 diabetes mellitus. There are no hypoglycemic associated symptoms. There are no diabetic associated symptoms. Pertinent negatives for diabetes include no blurred vision and no chest pain. There are no hypoglycemic complications. Risk factors for coronary artery disease include diabetes mellitus, obesity, hypertension and sedentary lifestyle.  Hypertension This is a chronic problem. The current episode started more than 1 year ago. The problem has been gradually improving since onset. The problem is uncontrolled. Pertinent negatives include no blurred vision, chest pain, palpitations or shortness of breath. Past treatments include angiotensin blockers and diuretics. The current treatment provides mild improvement. Compliance problems include exercise.      Past Medical History:  Diagnosis Date  . Ascending aorta dilatation (HCC)   . Asthma   . Chronic diastolic CHF (congestive heart failure) ([place])   . Chronic rhinitis   . Cough   . Edema, lower extremity   . GERD  (gastroesophageal reflux disease)   . HTN (hypertension)   . Mild aortic insufficiency   . Morbid obesity ([place])   . Murmur   . Pneumonia   . Pre-diabetes      Family History  Problem Relation Age of Onset  . Hypertension Mother   . Diabetes Mother   . Heart disease Mother   . Sleep apnea Mother   . Obesity Mother   . Hypertension Father   . Anemia Father      Current Outpatient Medications:  .  Azelastine-Fluticasone 137-50 MCG/ACT SUSP, Place 1 spray into the nose 2 (two) times daily., Disp: 23 g, Rfl: 3 .  budesonide-formoterol (SYMBICORT) 160-4.5 MCG/ACT inhaler, Inhale 2 puffs into the lungs 2 (two) times daily., Disp: 1 Inhaler, Rfl: 3 .  famotidine (PEPCID) 20 MG tablet, One at bedtime, Disp: 30 tablet, Rfl: 11 .  levocetirizine (XYZAL) 5 MG tablet, Take 1 tablet (5 mg total) by mouth every evening., Disp: 30 tablet, Rfl: 5 .  montelukast (SINGULAIR) 10 MG tablet, Take 1 tablet (10 mg total) by mouth at bedtime., Disp: 30 tablet, Rfl: 11 .  olmesartan (BENICAR) 40 MG tablet, TAKE 1 TABLET BY MOUTH EVERY DAY, Disp: 90 tablet, Rfl: 1 .  pantoprazole (PROTONIX) 40 MG tablet, Take 1 tablet by mouth daily, Disp: 180 tablet, Rfl: 0 .  Semaglutide, 1 MG/DOSE, (OZEMPIC, 1 MG/DOSE,) 2 MG/1.5ML SOPN, Inject 0.75 mLs (1 mg total) into the skin once a week., Disp: 1 pen, Rfl: 5 .  spironolactone (ALDACTONE) 25 MG tablet, Take 1 tablet (25 mg total) by mouth daily., Disp: 90 tablet, Rfl: 3 .  albuterol (PROVENTIL) (2.5 MG/3ML) 0.083% nebulizer solution, 1 vial in neb every 4 hours as needed (Patient not taking: Reported on 06/12/2020), Disp: , Rfl:  .  albuterol (VENTOLIN HFA) 108 (90 Base) MCG/ACT inhaler, TAKE 2 PUFFS BY MOUTH EVERY 4 TO 6 HOURS AS NEEDED (Patient not taking: Reported on 06/12/2020), Disp: 8.5 Inhaler, Rfl: 11 .  fluconazole (DIFLUCAN) 100 MG tablet, Take 1 tablet (100 mg total) by mouth daily for 7 days., Disp: 7 tablet, Rfl: 0 .  nystatin (NYSTATIN) powder, Apply 1  application topically 3 (three) times daily., Disp: 15 g, Rfl: 0 .  nystatin cream (MYCOSTATIN), Apply 1 application topically 2 (two) times daily., Disp: 30 g, Rfl: 2 .  Semaglutide-Weight Management (WEGOVY) 1.7 MG/0.75ML SOAJ, Inject 1.7 mg into the skin once a week., Disp: 3 mL, Rfl: 0   Allergies  Allergen Reactions  . Honey Bee Treatment [Bee Venom]   . Wheat Bran      Review of Systems  Constitutional: Negative.   Eyes: Negative for blurred vision.  Respiratory: Negative.  Negative for shortness of breath.   Cardiovascular: Negative.  Negative for chest pain and palpitations.  Gastrointestinal: Negative.   Skin: Positive for rash.       She c/o rash in between/under breasts. Appears to be getting worse.   Psychiatric/Behavioral: Negative.   All other systems reviewed and are negative.    Today's Vitals   06/12/20 1534  BP: 130/84  Pulse: 70  Temp: 97.9 F (36.6 C)  TempSrc: Oral  Weight: 268 lb 3.2 oz (121.7 kg)  Height: 5' 2.7" (1.593 m)   Body mass index is 47.97 kg/m.  Wt Readings from Last 3 Encounters:  06/12/20 268 lb 3.2 oz (121.7 kg)  04/04/20 266 lb 12.8 oz (121 kg)  03/02/20 267 lb 12.8 oz (121.5 kg)   Objective:  Physical Exam Vitals and nursing note reviewed.  Constitutional:      Appearance: Normal appearance. She is obese.  HENT:     Head: Normocephalic and atraumatic.  Cardiovascular:     Rate and Rhythm: Normal rate and regular rhythm.     Heart sounds: Normal heart sounds.  Pulmonary:     Breath sounds: Normal breath sounds.  Skin:    General: Skin is warm.     Findings: Rash present.     Comments: Erythematous, dry, scaly rash in between breasts. Less scaly underneath breasts, scattered erythematous papules.   Neurological:     General: No focal deficit present.     Mental Status: She is alert and oriented to person, place, and time.         Assessment And Plan:     1. Type 2 diabetes mellitus without complication, without  long-term current use of insulin (HCC) Comments: Chronic. Importance of regular exercise was discussed with the patient. She is encouraged to aim for at least 30 minutes five days per week.  - Hemoglobin A1c - BMP8+EGFR  2. Hypertensive heart disease with chronic diastolic congestive heart failure (HCC) Comments: Chronic, controlled. She will continue with current meds for now. Encouraged to avoid adding salt to her foods.   3. Intertrigo Comments: She was given rx nystatin cream/powder.  Advised to use powder during the day and cream at night.   4. Immunization due Comments: She was given Pneumovax-23 IM x 1 to update her immunization history.   5. Class 3 severe obesity due to excess calories with serious comorbidity and body mass index (BMI) of 45.0 to  49.9 in adult [facility])  We discussed the use of Wegovy for obesity. She denies family/personal h/o thyroid cancer. She is already on Ozempic '1mg'$  once weekly for diabetes. Therefore, I will start her on 1.'7mg'$  once weekly.  She was instructed on how to administer the medication. She understands that she will not take her next dose until next week. She is reminded to stop eating when full. She will rto in 4 weeks for re-evaluation.  Possible side effects d/w patient.   - Ambulatory referral to Internal Medicine  6. Encounter for screening mammogram for malignant neoplasm of breast Comments: [facility]. She is agreeable to treatment plan. Advised to perform monthly self breast exams.  - MM Digital Screening; Future  7. Encounter for HCV screening test for low risk patient Comments: I will check HCV-Ab.  - Hepatitis C antibody She is encouraged to strive for BMI less than 30 to decrease cardiac risk. Advised to aim for at least 150 minutes of exercise per week.  Patient was given opportunity to ask questions. Patient verbalized understanding of the plan and was able to repeat key elements of the plan. All  questions were answered to their satisfaction.  [name], MD   I, [name], MD, have reviewed all documentation for this visit. The documentation on 06/19/20 for the exam, diagnosis, procedures, and orders are all accurate and complete.  THE PATIENT IS ENCOURAGED TO PRACTICE SOCIAL DISTANCING DUE TO THE COVID-19 PANDEMIC.

## 2020-06-12 NOTE — Patient Instructions (Signed)

## 2020-06-13 LAB — BMP8+EGFR
BUN/Creatinine Ratio: 17 (ref 9–23)
BUN: 11 mg/dL (ref 6–24)
CO2: 25 mmol/L (ref 20–29)
Calcium: 9.5 mg/dL (ref 8.7–10.2)
Chloride: 105 mmol/L (ref 96–106)
Creatinine, Ser: 0.65 mg/dL (ref 0.57–1.00)
GFR calc Af Amer: 118 mL/min/{1.73_m2} (ref >59)
GFR calc non Af Amer: 102 mL/min/{1.73_m2} (ref >59)
Glucose: 79 mg/dL (ref 65–99)
Potassium: 4.1 mmol/L (ref 3.5–5.2)
Sodium: 143 mmol/L (ref 134–144)

## 2020-06-13 LAB — HEMOGLOBIN A1C
Est. average glucose Bld gHb Est-mCnc: 128 mg/dL
Hgb A1c MFr Bld: 6.1 % — ABNORMAL HIGH (ref 4.8–5.6)

## 2020-06-13 LAB — HEPATITIS C ANTIBODY: Hep C Virus Ab: <0.1 s/co ratio (ref 0.0–0.9)

## 2020-06-16 ENCOUNTER — Ambulatory Visit: Payer: BC Managed Care – PPO | Admitting: Internal Medicine

## 2020-07-07 ENCOUNTER — Ambulatory Visit
Admission: RE | Admit: 2020-07-07 | Discharge: 2020-07-07 | Disposition: A | Discharge status: Home / Self Care | Payer: BC Managed Care – PPO | Source: Ambulatory Visit | Attending: Internal Medicine | Admitting: Internal Medicine

## 2020-07-07 DIAGNOSIS — Z1231 Encounter for screening mammogram for malignant neoplasm of breast: Secondary | ICD-10-CM

## 2020-07-07 IMAGING — MG DIGITAL SCREENING BILAT W/ CAD
5 series · 5 of 5 positions shown · non-contrast
Comparison: Previous exam(s).

CLINICAL DATA: Screening.

EXAM:
DIGITAL SCREENING BILATERAL MAMMOGRAM WITH CAD

[L CV]
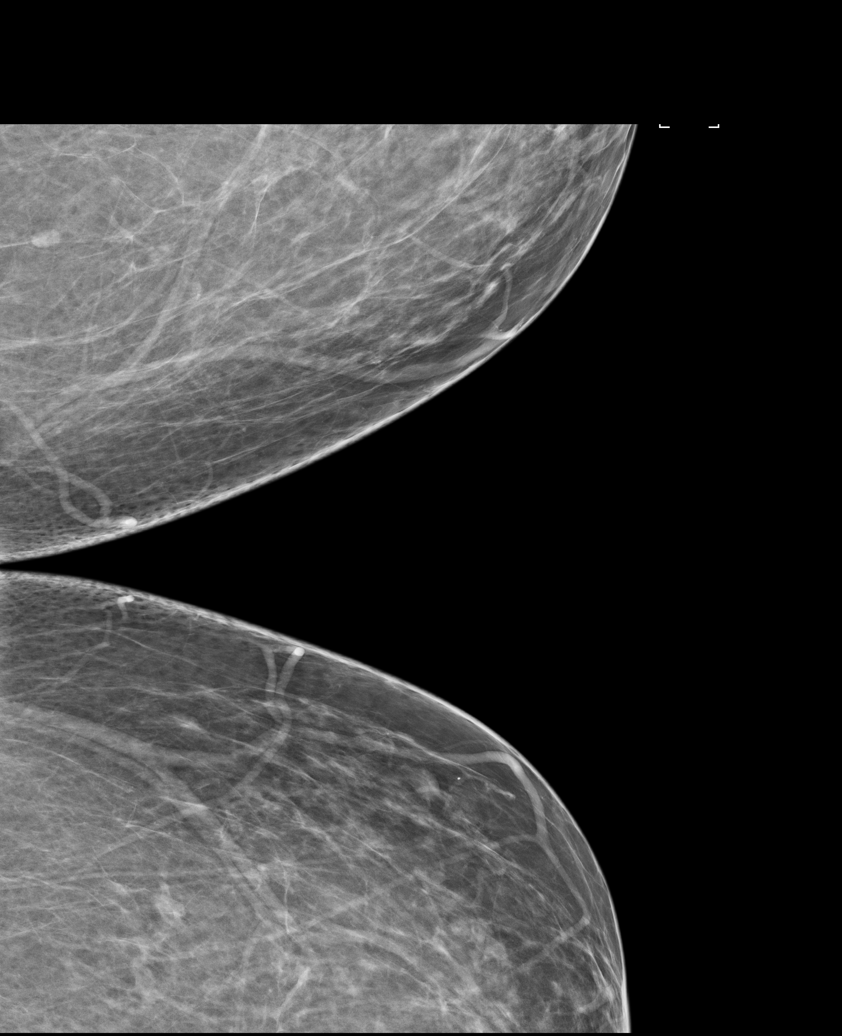

[R MLO]
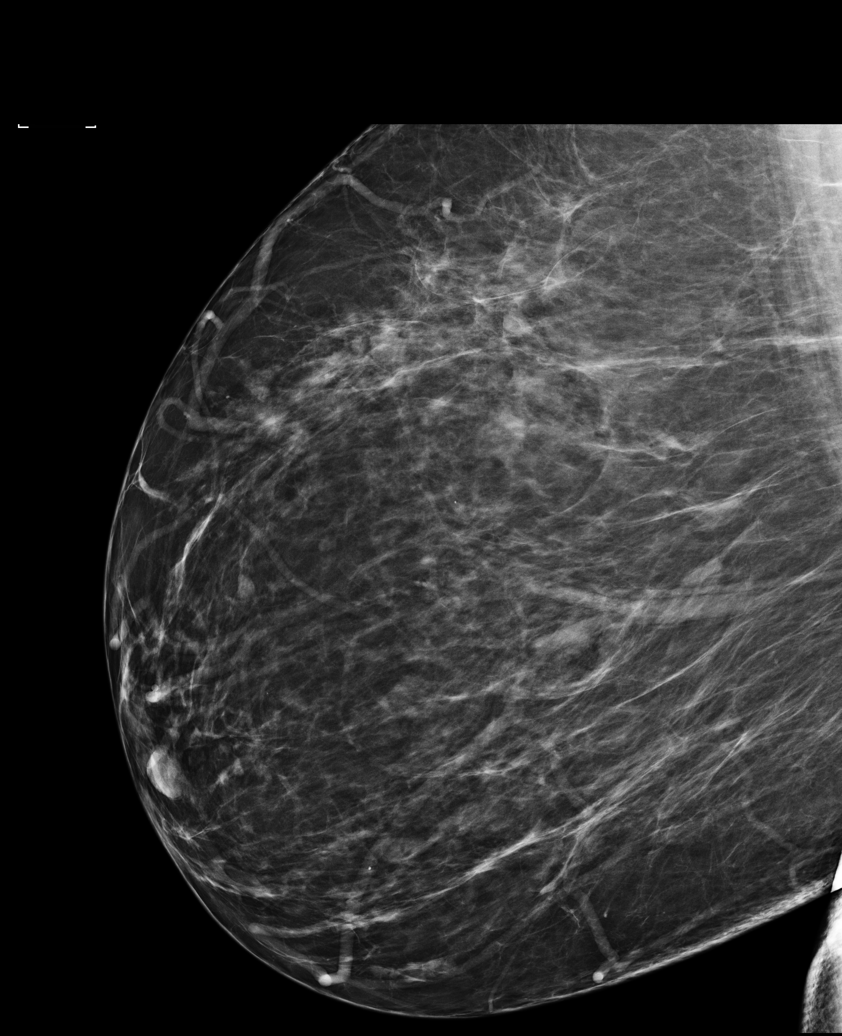

[L MLO]
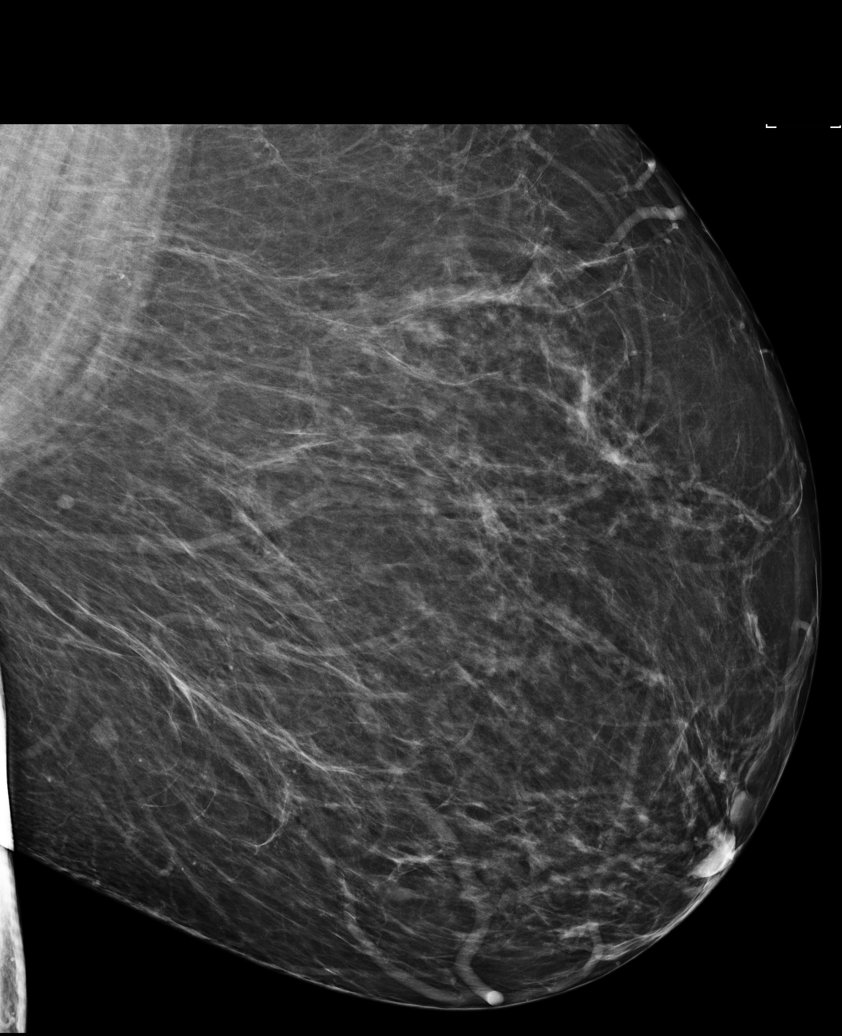

[R CC]
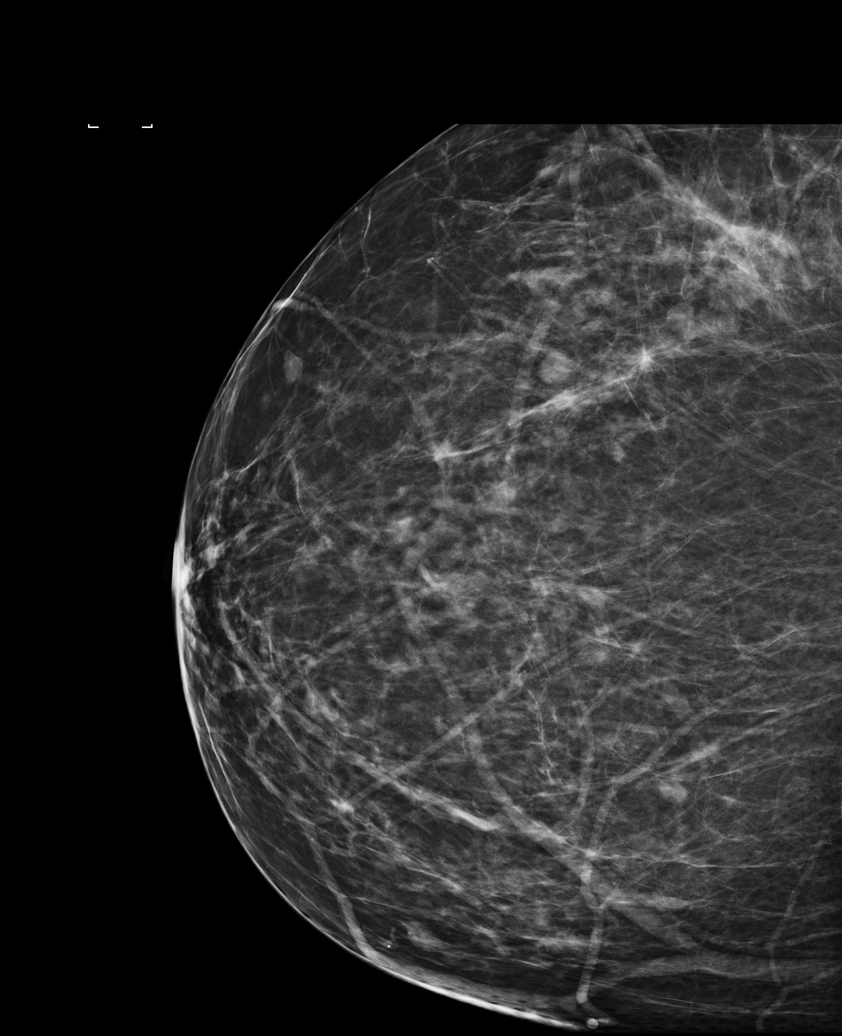

[L CC]
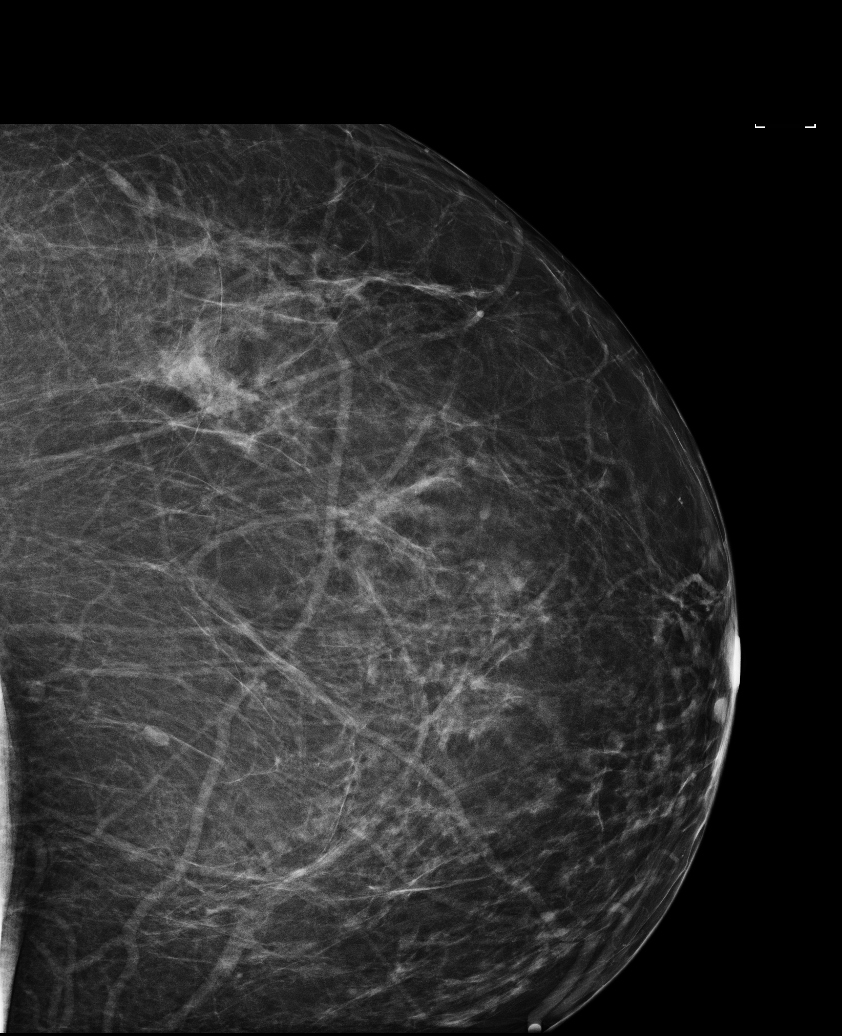

[5 of 5 positions shown; findings below may reference images not displayed]

ACR Breast Density Category b: There are scattered areas of
fibroglandular density.
FINDINGS: There are no findings suspicious for malignancy. Images were
processed with CAD.
IMPRESSION: No mammographic evidence of malignancy. A result letter of this
screening mammogram will be mailed directly to the patient.

RECOMMENDATION:
Screening mammogram in one year. (Code:[[place]])

BI-RADS CATEGORY  1: Negative.

## 2020-07-12 ENCOUNTER — Ambulatory Visit: Payer: BC Managed Care – PPO | Admitting: Internal Medicine

## 2020-07-13 ENCOUNTER — Ambulatory Visit: Payer: BC Managed Care – PPO | Admitting: Allergy

## 2020-08-16 ENCOUNTER — Other Ambulatory Visit: Payer: Self-pay

## 2020-08-16 ENCOUNTER — Ambulatory Visit: Payer: BC Managed Care – PPO | Admitting: Allergy

## 2020-08-16 ENCOUNTER — Encounter: Payer: Self-pay | Admitting: Allergy

## 2020-08-16 VITALS — BP 108/70 | HR 89 | Temp 98.3°F | Resp 16 | Ht 63.25 in | Wt 266.8 lb

## 2020-08-16 DIAGNOSIS — J45991 Cough variant asthma: Secondary | ICD-10-CM | POA: Diagnosis not present

## 2020-08-16 DIAGNOSIS — H1013 Acute atopic conjunctivitis, bilateral: Secondary | ICD-10-CM | POA: Diagnosis not present

## 2020-08-16 DIAGNOSIS — J3089 Other allergic rhinitis: Secondary | ICD-10-CM | POA: Diagnosis not present

## 2020-08-16 DIAGNOSIS — T7800XA Anaphylactic reaction due to unspecified food, initial encounter: Secondary | ICD-10-CM

## 2020-08-16 NOTE — Patient Instructions (Signed)
Cough variant asthma  - continue Symbicort 2 puffs twice a day with spacer device  - have access to albuterol inhaler 2 puffs every 4-6 hours as needed for cough/wheeze/shortness of breath/chest tightness.  May use 15-20 minutes prior to activity.   Monitor frequency of use.    - continue Singulair 10mg  daily at bedtime  Asthma control goals:   Full participation in all desired activities (may need albuterol before activity)  Albuterol use two time or less a week on average (not counting use with activity)  Cough interfering with sleep two time or less a month  Oral steroids no more than once a year  No hospitalizations  Allergies   - continue allergen avoidance measures for tree pollen and mold  - start Xyzal 5mg  daily.  This is a long-acting antihistamine that may be more effective than Allergra, Zyrtec and Claritin.   - continue singulair as above  - continue dymista 1 spray each nostril twice a day.  This is a combination nasal spray with Flonase + Astelin (nasal antihistamine).  This helps with both nasal congestion and drainage.   - recommend use of nasal saline rinses to to help keep nose/sinuses flushed out of any pollen, germs, particles.  Use rinse with either distilled water or boil water and bring to room temperate.  Breathe thru your mouth while performing rinse.    Food allergy vs intolerance  - you have had serum IgE detectable levels to wheat and honey however they are low  - skin testing for food allergens is negative to all foods  - continue avoidance of honey and wheat in the diet  - have access to self-injectable epinephrine (Epipen or AuviQ) 0.3mg  at all times  - follow emergency action plan in case of allergic reaction  Follow-up 3-4 months or sooner if needed

## 2020-08-16 NOTE — Progress Notes (Signed)
Follow-up Note  RE: Jessica Snow MRN: [id number] DOB: [birth year] Date of Office Visit: 08/16/2020   History of present illness: Jessica Snow is a 54 y.o. female presenting today for follow-up of cough variant asthma, allergic rhinitis with conjunctivitis and food allergy.  She was last seen in the office on 03/02/2020 by myself.  She states since this visit she saw Dr. Work who recommended she did not need to have the albuterol vials for nebulizer anymore as she was doing well on her current regimen.  She is doing Symbicort 160 mcg taking 2 puffs twice a day with a spacer device.  She also takes Singulair daily.  She states about 2 weeks ago she started a walking in the park more and since then she has noted more throat itching and ear itching as well as more shortness of breath and some chest tightness.  She also feels the changes in the weather may be related as well.  She has had to use her albuterol for this but states she does not use her albuterol prior to her exercise. She is not taking any antihistamines at this time.  She has tried Careers adviser, Zyrtec and Claritin in the past without much response.  She is taking Dymista twice a day for nasal congestion and drainage control. She continues to avoid honey and wheat products in the diet without any accidental ingestions or need to use her epinephrine device.     Review of systems: Review of Systems  Constitutional: Negative.   HENT:       See HPI  Eyes: Negative.   Respiratory:       See HPI  Cardiovascular: Negative.   Gastrointestinal: Negative.   Musculoskeletal: Negative.   Skin: Negative.   Neurological: Negative.     All other systems negative unless noted above in HPI  Past medical/social/surgical/family history have been reviewed and are unchanged unless specifically indicated below.  No changes  Medication List: Current Outpatient Medications  Medication Sig Dispense Refill  . albuterol (VENTOLIN HFA) 108 (90  Base) MCG/ACT inhaler TAKE 2 PUFFS BY MOUTH EVERY 4 TO 6 HOURS AS NEEDED 8.5 Inhaler 11  . Azelastine-Fluticasone 137-50 MCG/ACT SUSP Place 1 spray into the nose 2 (two) times daily. 23 g 3  . budesonide-formoterol (SYMBICORT) 160-4.5 MCG/ACT inhaler Inhale 2 puffs into the lungs 2 (two) times daily. 1 Inhaler 3  . EPINEPHrine 0.3 mg/0.3 mL IJ SOAJ injection SMARTSIG:0.3 Milligram(s) IM Once PRN    . famotidine (PEPCID) 20 MG tablet One at bedtime 30 tablet 11  . levocetirizine (XYZAL) 5 MG tablet Take 1 tablet (5 mg total) by mouth every evening. 30 tablet 5  . montelukast (SINGULAIR) 10 MG tablet Take 1 tablet (10 mg total) by mouth at bedtime. 30 tablet 11  . nystatin (NYSTATIN) powder Apply 1 application topically 3 (three) times daily. 15 g 0  . nystatin cream (MYCOSTATIN) Apply 1 application topically 2 (two) times daily. 30 g 2  . pantoprazole (PROTONIX) 40 MG tablet Take 1 tablet by mouth daily 180 tablet 0  . spironolactone (ALDACTONE) 25 MG tablet Take 1 tablet (25 mg total) by mouth daily. 90 tablet 3  . albuterol (PROVENTIL) (2.5 MG/3ML) 0.083% nebulizer solution 1 vial in neb every 4 hours as needed (Patient not taking: Reported on 06/12/2020)    . olmesartan (BENICAR) 40 MG tablet TAKE 1 TABLET BY MOUTH EVERY DAY (Patient not taking: Reported on 08/16/2020) 90 tablet 1  . Semaglutide, 1  MG/DOSE, (OZEMPIC, 1 MG/DOSE,) 2 MG/1.5ML SOPN Inject 0.75 mLs (1 mg total) into the skin once a week. 1 pen 5  . Semaglutide-Weight Management (WEGOVY) 1.7 MG/0.75ML SOAJ Inject 1.7 mg into the skin once a week. (Patient not taking: Reported on 08/16/2020) 3 mL 0   No current facility-administered medications for this visit.     Known medication allergies: Allergies  Allergen Reactions  . Honey Bee Treatment [Bee Venom]   . Wheat Bran      Physical examination: Blood pressure 108/70, pulse 89, temperature 98.3 F (36.8 C), resp. rate 16, height 5' 3.25" (1.607 m), weight 266 lb 12.8 oz (121  kg), last menstrual period 12/19/2005, SpO2 96 %.  General: Alert, interactive, in no acute distress. HEENT: PERRLA,TMs pearly gray, turbinates minimally edematous without discharge, post-pharynx non erythematous. Neck: Supple without lymphadenopathy. Lungs: Clear to auscultation without wheezing, rhonchi or rales. {no increased work of breathing. CV: Normal S1, S2 without murmurs. Abdomen: Nondistended, nontender. Skin: Warm and dry, without lesions or rashes. Extremities:  No clubbing, cyanosis or edema. Neuro:   Grossly intact.  Diagnositics/Labs: None today  Assessment and plan:   Cough variant asthma  - continue Symbicort 2 puffs twice a day with spacer device  - have access to albuterol inhaler 2 puffs every 4-6 hours as needed for cough/wheeze/shortness of breath/chest tightness.  May use 15-20 minutes prior to activity.   Monitor frequency of use.    - continue Singulair 10mg  daily at bedtime  Asthma control goals:   Full participation in all desired activities (may need albuterol before activity)  Albuterol use two time or less a week on average (not counting use with activity)  Cough interfering with sleep two time or less a month  Oral steroids no more than once a year  No hospitalizations  Allergic rhinitis with conjunctivitis  - continue allergen avoidance measures for tree pollen and mold  - start Xyzal 5mg  daily.  This is a long-acting antihistamine that may be more effective than Allergra, Zyrtec and Claritin.   - continue singulair as above  - continue dymista 1 spray each nostril twice a day.  This is a combination nasal spray with Flonase + Astelin (nasal antihistamine).  This helps with both nasal congestion and drainage.   - recommend use of nasal saline rinses to to help keep nose/sinuses flushed out of any pollen, germs, particles.  Use rinse with either distilled water or boil water and bring to room temperate.  Breathe thru your mouth while  performing rinse.    Food allergy vs intolerance  - you have had serum IgE detectable levels to wheat and honey however they are low  - skin testing for food allergens is negative to all foods  - continue avoidance of honey and wheat in the diet  - have access to self-injectable epinephrine (Epipen or AuviQ) 0.3mg  at all times  - follow emergency action plan in case of allergic reaction  Follow-up 3-4 months or sooner if needed  I appreciate the opportunity to take part in Darya's care. Please do not hesitate to contact me with questions.  Sincerely,   , MD Allergy/Immunology [facility]

## 2020-08-18 LAB — HM DIABETES EYE EXAM

## 2020-08-22 ENCOUNTER — Encounter: Payer: Self-pay | Admitting: Internal Medicine

## 2020-10-02 NOTE — Progress Notes (Signed)
Cardiology Office Note    Date:  10/04/2020   ID:  Jessica Snow, DOB April 24, 1966, MRN [id number]  PCP:  Dorothyann Peng, MD  Cardiologist: Tobias Alexander, MD EPS: None  Chief Complaint  Patient presents with  . Follow-up    History of Present Illness:  Jessica Snow is a 54 y.o. female with chronic diastolic CHF, hypertension, obesity, dilated ascending thoracic aorta CTA 10/2018- 39 mm, echo 08/2019-39 mm, HLD, coronary CTA 09/2018 calcium score 0, mild AI  Patient was last seen in our office by Ronie Spies, PA-C 11/2019 which time she was short of breath felt to be multifactorial including diastolic CHF, asthma, obesity.  Saw Dr. Mayford Knife 01/04/2020 and was feeling more rested on CPAP.  Patient comes in for f/u. Chronic dyspnea from asthma. When she coughs she has a sharp pain. 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 BP up today but hasn't been checking at home. Hasn't taken her meds yet. Was low in Oct at allergy office.Having bilateral hand pain and tingling and left arm pain after sleeping on her left side.  Past Medical History:  Diagnosis Date  . Ascending aorta dilatation (HCC)   . Asthma   . Chronic diastolic CHF (congestive heart failure) (HCC)   . Chronic rhinitis   . Cough   . Edema, lower extremity   . GERD (gastroesophageal reflux disease)   . HTN (hypertension)   . Mild aortic insufficiency   . Morbid obesity (HCC)   . Murmur   . Pneumonia   . Pre-diabetes     Past Surgical History:  Procedure Laterality Date  . ANKLE RECONSTRUCTION    . TONSILLECTOMY    . TOTAL VAGINAL HYSTERECTOMY  2007    Current Medications: Current Meds  Medication Sig  . albuterol (PROVENTIL) (2.5 MG/3ML) 0.083% nebulizer solution 1 vial in neb every 4 hours as needed  . albuterol (VENTOLIN HFA) 108 (90 Base) MCG/ACT inhaler TAKE 2 PUFFS BY MOUTH EVERY 4 TO 6 HOURS AS NEEDED  . Azelastine-Fluticasone 137-50 MCG/ACT SUSP Place 1 spray into the  nose 2 (two) times daily.  . budesonide-formoterol (SYMBICORT) 160-4.5 MCG/ACT inhaler Inhale 2 puffs into the lungs 2 (two) times daily.  Marland Kitchen EPINEPHrine 0.3 mg/0.3 mL IJ SOAJ injection SMARTSIG:0.3 Milligram(s) IM Once PRN  . famotidine (PEPCID) 20 MG tablet One at bedtime  . levocetirizine (XYZAL) 5 MG tablet Take 1 tablet (5 mg total) by mouth every evening.  . montelukast (SINGULAIR) 10 MG tablet Take 1 tablet (10 mg total) by mouth at bedtime.  Marland Kitchen nystatin (NYSTATIN) powder Apply 1 application topically 3 (three) times daily.  Marland Kitchen nystatin cream (MYCOSTATIN) Apply 1 application topically 2 (two) times daily.  Marland Kitchen olmesartan (BENICAR) 40 MG tablet TAKE 1 TABLET BY MOUTH EVERY DAY  . pantoprazole (PROTONIX) 40 MG tablet Take 1 tablet by mouth daily  . Semaglutide-Weight Management (WEGOVY) 1.7 MG/0.75ML SOAJ Inject 1.7 mg into the skin once a week.  . spironolactone (ALDACTONE) 25 MG tablet Take 1 tablet (25 mg total) by mouth daily.     Allergies:   Honey bee treatment [bee venom] and Wheat bran   Social History   Socioeconomic History  . Marital status: Single    Spouse name: Not on file  . Number of children: Not on file  . Years of education: Not on file  . Highest education level: Not on file  Occupational History  . Occupation: Youth worker for [employer]  Employer: [employer]  Tobacco Use  . Smoking status: Former Smoker    Packs/day: 0.25    Years: 10.00    Pack years: 2.50    Types: Cigarettes    Quit date: 01/26/2018    Years since quitting: 2.6  . Smokeless tobacco: Former Clinical biochemist  . Vaping Use: Never used  Substance and Sexual Activity  . Alcohol use: No  . Drug use: No  . Sexual activity: Never  Other Topics Concern  . Not on file  Social History Narrative   Lives with two children.     Social Determinants of Health   Financial Resource Strain:   . Difficulty of Paying Living Expenses: Not on file  Food Insecurity:   . Worried About Programme researcher, broadcasting/film/video  in the Last Year: Not on file  . Ran Out of Food in the Last Year: Not on file  Transportation Needs:   . Lack of Transportation (Medical): Not on file  . Lack of Transportation (Non-Medical): Not on file  Physical Activity:   . Days of Exercise per Week: Not on file  . Minutes of Exercise per Session: Not on file  Stress:   . Feeling of Stress : Not on file  Social Connections:   . Frequency of Communication with Friends and Family: Not on file  . Frequency of Social Gatherings with Friends and Family: Not on file  . Attends Religious Services: Not on file  . Active Member of Clubs or Organizations: Not on file  . Attends Banker Meetings: Not on file  . Marital Status: Not on file     Family History:  The patient's family history includes Anemia in her father; Diabetes in her mother; Heart disease in her mother; Hypertension in her father and mother; Obesity in her mother; Sleep apnea in her mother.   ROS:   Please see the history of present illness.    ROS All other systems reviewed and are negative.   PHYSICAL EXAM:   VS:  BP (!) 150/90   Pulse 65   Ht 5' 3.25" (1.607 m)   Wt 267 lb 6.4 oz (121.3 kg)   LMP 12/19/2005   SpO2 98%   BMI 46.99 kg/m   Physical Exam  PJA:SNKNL, in no acute distress  Neck: no JVD, carotid bruits, or masses Cardiac:RRR; 2/6 diastolic murmur LSB Respiratory:  clear to auscultation bilaterally, normal work of breathing GI: soft, nontender, nondistended, + BS Ext: without cyanosis, clubbing, or edema, Good distal pulses bilaterally Neuro:  Alert and Oriented x 3 Psych: euthymic mood, full affect  Wt Readings from Last 3 Encounters:  10/04/20 267 lb 6.4 oz (121.3 kg)  08/16/20 266 lb 12.8 oz (121 kg)  06/12/20 268 lb 3.2 oz (121.7 kg)      Studies/Labs Reviewed:   EKG:  EKG is ordered today.  The ekg ordered today demonstrates NSR with nonspecific ST changes  Recent Labs: 06/12/2020: BUN 11; Creatinine, Ser 0.65; Potassium  4.1; Sodium 143   Lipid Panel    Component Value Date/Time   CHOL 154 05/17/2019 1402   TRIG 65 05/17/2019 1402   HDL 55 05/17/2019 1402   LDLCALC 86 05/17/2019 1402    Additional studies/ records that were reviewed today include:  Echocardiogram 08/31/2019 EF 60-65, indeterminate diastolic function, normal RVSF, mild dilation of ascending aorta (39 mm)   Echocardiogram 08/27/2018 Mild focal basal septal hypertrophy, EF 60-65, no RWMA, Gr 2 DD, mild AI, ascending aorta  40 mm (mildly dilated)   Coronary CTA 11/03/2018 IMPRESSION: No acute extra cardiac abnormality. 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 Ascending aorta upper limits normal in diameter at 3.9 cm.   IMPRESSION: 1. Coronary calcium score of 0. This was 0 percentile for age and sex matched control. 2. Normal coronary origin with left dominance. 3. This is a poor quality study secondary to patient's size and motion. However, there no evidence of CAD in the visualized portions of the coronary arteries. 4. Dilated pulmonary artery measuring 35 mm suggestive of pulmonary hypertension.     ASSESSMENT:    1. Shortness of breath   2. Essential hypertension   3. Ascending aorta dilatation (HCC)   4. Morbid obesity due to excess calories (HCC)   5. OSA on CPAP      PLAN:  In order of problems listed above:  Shortness of breath felt secondary to asthma/obesity  Chronic diastolic CHF on echo 08/2019 normal LVEF 60 to 65% no edema on exam  Essential hypertension BP up today, but normal at visit in Oct. She hasn't had her meds yet today. Will try to get her a cuff and she will monitor and call us with results.  Ascending aortic dilatation 39 mm on echo 08/2019-recheck echo  Morbid obesity-didn't like weight loss center. Is working with PCP to find another program.  OSA on CPAP followed by Dr. Delford Field uses it sometimes. Discussed importance of compliance.     Medication Adjustments/Labs and  Tests Ordered: Current medicines are reviewed at length with the patient today.  Concerns regarding medicines are outlined above.  Medication changes, Labs and Tests ordered today are listed in the Patient Instructions below. Patient Instructions   Medication Instructions:  Your physician recommends that you continue on your current medications as directed. Please refer to the Current Medication list given to you today.  *If you need a refill on your cardiac medications before your next appointment, please call your pharmacy*   Lab Work: TODAY: FLP, CMET  If you have labs (blood work) drawn today and your tests are completely normal, you will receive your results only by: Marland Kitchen MyChart Message (if you have MyChart) OR . A paper copy in the mail If you have any lab test that is abnormal or we need to change your treatment, we will call you to review the results.   Testing/Procedures: Your physician has requested that you have an echocardiogram. Echocardiography is a painless test that uses sound waves to create images of your heart. It provides your doctor with information about the size and shape of your heart and how well your heart's chambers and valves are working. This procedure takes approximately one hour. There are no restrictions for this procedure.   Follow-Up: At [facility], you and your health needs are our priority.  As part of our continuing mission to provide you with exceptional heart care, we have created designated Provider Care Teams.  These Care Teams include your primary Cardiologist (physician) and Advanced Practice Providers (APPs -  Physician Assistants and Nurse Practitioners) who all work together to provide you with the care you need, when you need it.  We recommend signing up for the patient portal called "MyChart".  Sign up information is provided on this After Visit Summary.  MyChart is used to connect with patients for Virtual Visits (Telemedicine).  Patients  are able to view lab/test results, encounter notes, upcoming appointments, etc.  Non-urgent messages can be sent to your provider  as well.   To learn more about what you can do with MyChart, go to [email or web address].    Your next appointment:   1 year(s)  The format for your next appointment:   In Person  Provider:   You may see Tobias Alexander, MD or one of the following Advanced Practice Providers on your designated Care Team:    Ronie Spies, PA-C  Jacolyn Reedy, PA-C    Other Instructions Check your blood pressure for 2 weeks and send [place] the readings through your MyChart.  Your provider recommends that you maintain 150 minutes per week of moderate aerobic activity.  Two Gram Sodium Diet 2000 mg  What is Sodium? Sodium is a mineral found naturally in many foods. The most significant source of sodium in the diet is table salt, which is about 40% sodium.  Processed, convenience, and preserved foods also contain a large amount of sodium.  The body needs only 500 mg of sodium daily to function,  A normal diet provides more than enough sodium even if you do not use salt.  Why Limit Sodium? A build up of sodium in the body can cause thirst, increased blood pressure, shortness of breath, and water retention.  Decreasing sodium in the diet can reduce edema and risk of heart attack or stroke associated with high blood pressure.  Keep in mind that there are many other factors involved in these health problems.  Heredity, obesity, lack of exercise, cigarette smoking, stress and what you eat all play a role.  General Guidelines:  Do not add salt at the table or in cooking.  One teaspoon of salt contains over 2 grams of sodium.  Read food labels  Avoid processed and convenience foods  Ask your dietitian before eating any foods not dicussed in the menu planning guidelines  Consult your physician if you wish to use a salt substitute or a sodium containing medication such as antacids.   Limit milk and milk products to 16 oz (2 cups) per day.  Shopping Hints:  READ LABELS!! "Dietetic" does not necessarily mean low sodium.  Salt and other sodium ingredients are often added to foods during processing.   Menu Planning Guidelines Food Group Choose More Often Avoid  Beverages (see also the milk group All fruit juices, low-sodium, salt-free vegetables juices, low-sodium carbonated beverages Regular vegetable or tomato juices, commercially softened water used for drinking or cooking  Breads and Cereals Enriched white, wheat, rye and pumpernickel bread, hard rolls and dinner rolls; muffins, cornbread and waffles; most dry cereals, cooked cereal without added salt; unsalted crackers and breadsticks; low sodium or homemade bread crumbs Bread, rolls and crackers with salted tops; quick breads; instant hot cereals; pancakes; commercial bread stuffing; self-rising flower and biscuit mixes; regular bread crumbs or cracker crumbs  Desserts and Sweets Desserts and sweets mad with mild should be within allowance Instant pudding mixes and cake mixes  Fats Butter or margarine; vegetable oils; unsalted salad dressings, regular salad dressings limited to 1 Tbs; light, sour and heavy cream Regular salad dressings containing bacon fat, bacon bits, and salt pork; snack dips made with instant soup mixes or processed cheese; salted nuts  Fruits Most fresh, frozen and canned fruits Fruits processed with salt or sodium-containing ingredient (some dried fruits are processed with sodium sulfites        Vegetables Fresh, frozen vegetables and low- sodium canned vegetables Regular canned vegetables, sauerkraut, pickled vegetables, and others prepared in brine; frozen vegetables in sauces; vegetables seasoned with ham,  bacon or salt pork  Condiments, Sauces, Miscellaneous  Salt substitute with physician's approval; pepper, herbs, spices; vinegar, lemon or lime juice; hot pepper sauce; garlic powder, onion  powder, low sodium soy sauce (1 Tbs.); low sodium condiments (ketchup, chili sauce, mustard) in limited amounts (1 tsp.) fresh ground horseradish; unsalted tortilla chips, pretzels, potato chips, popcorn, salsa (1/4 cup) Any seasoning made with salt including garlic salt, celery salt, onion salt, and seasoned salt; sea salt, rock salt, kosher salt; meat tenderizers; monosodium glutamate; mustard, regular soy sauce, barbecue, sauce, chili sauce, teriyaki sauce, steak sauce, Worcestershire sauce, and most flavored vinegars; canned gravy and mixes; regular condiments; salted snack foods, olives, picles, relish, horseradish sauce, catsup   Food preparation: Try these seasonings Meats:    Pork Sage, onion Serve with applesauce  Chicken Poultry seasoning, thyme, parsley Serve with cranberry sauce  Lamb Curry powder, rosemary, garlic, thyme Serve with mint sauce or jelly  Veal Marjoram, basil Serve with current jelly, cranberry sauce  Beef Pepper, bay leaf Serve with dry mustard, unsalted chive butter  Fish [place] leaf, dill Serve with unsalted lemon butter, unsalted parsley butter  Vegetables:    Asparagus Lemon juice   Broccoli Lemon juice   Carrots Mustard dressing parsley, mint, nutmeg, glazed with unsalted butter and sugar   Green beans Marjoram, lemon juice, nutmeg,dill seed   Tomatoes Basil, marjoram, onion   Spice /blend for [employer]"Salt Shaker" 4 tsp ground thyme 1 tsp ground sage 3 tsp ground rosemary 4 tsp ground marjoram   Test your knowledge 1. A product that says "Salt Free" may still contain sodium. True or False 2. Garlic Powder and Hot Pepper Sauce an be used as alternative seasonings.True or False 3. Processed foods have more sodium than fresh foods.  True or False 4. Canned Vegetables have less sodium than froze True or False  WAYS TO DECREASE YOUR SODIUM INTAKE 1. Avoid the use of added salt in cooking and at the table.  Table salt (and other prepared seasonings which contain salt) is  probably one of the greatest sources of sodium in the diet.  Unsalted foods can gain flavor from the sweet, sour, and butter taste sensations of herbs and spices.  Instead of using salt for seasoning, try the following seasonings with the foods listed.  Remember: how you use them to enhance natural food flavors is limited only by your creativity... Allspice-Meat, fish, eggs, fruit, peas, red and yellow vegetables Almond Extract-Fruit baked goods Anise Seed-Sweet breads, fruit, carrots, beets, cottage cheese, cookies (tastes like licorice) Basil-Meat, fish, eggs, vegetables, rice, vegetables salads, soups, sauces Bay Leaf-Meat, fish, stews, poultry Burnet-Salad, vegetables (cucumber-like flavor) Caraway Seed-Bread, cookies, cottage cheese, meat, vegetables, cheese, rice Cardamon-Baked goods, fruit, soups Celery Powder or seed-Salads, salad dressings, sauces, meatloaf, soup, bread.Do not use  celery salt Chervil-Meats, salads, fish, eggs, vegetables, cottage cheese (parsley-like flavor) Chili Power-Meatloaf, chicken cheese, corn, eggplant, egg dishes Chives-Salads cottage cheese, egg dishes, soups, vegetables, sauces Cilantro-Salsa, casseroles Cinnamon-Baked goods, fruit, pork, lamb, chicken, carrots Cloves-Fruit, baked goods, fish, pot roast, green beans, beets, carrots Coriander-Pastry, cookies, meat, salads, cheese (lemon-orange flavor) Cumin-Meatloaf, fish,cheese, eggs, cabbage,fruit pie (caraway flavor) [employer]ersCurry Powder-Meat, fruit, eggs, fish, poultry, cottage cheese, vegetables Dill Seed-Meat, cottage cheese, poultry, vegetables, fish, salads, bread Fennel Seed-Bread, cookies, apples, pork, eggs, fish, beets, cabbage, cheese, Licorice-like flavor Garlic-(buds or powder) Salads, meat, poultry, fish, bread, butter, vegetables, potatoes.Do not  use garlic salt Ginger-Fruit, vegetables, baked goods, meat, fish, poultry Horseradish Root-Meet, vegetables, butter Lemon Juice or Extract-Vegetables,  fruit, tea, baked goods, fish salads Mace-Baked goods fruit, vegetables, fish, poultry (taste like nutmeg) Maple Extract-Syrups Marjoram-Meat, chicken, fish, vegetables, breads, green salads (taste like Sage) Mint-Tea, lamb, sherbet, vegetables, desserts, carrots, cabbage Mustard, Dry or Seed-Cheese, eggs, meats, vegetables, poultry Nutmeg-Baked goods, fruit, chicken, eggs, vegetables, desserts Onion Powder-Meat, fish, poultry, vegetables, cheese, eggs, bread, rice salads (Do not use   Onion salt) Orange Extract-Desserts, baked goods Oregano-Pasta, eggs, cheese, onions, pork, lamb, fish, chicken, vegetables, green salads Paprika-Meat, fish, poultry, eggs, cheese, vegetables Parsley Flakes-Butter, vegetables, meat fish, poultry, eggs, bread, salads (certain forms may   Contain sodium Pepper-Meat fish, poultry, vegetables, eggs Peppermint Extract-Desserts, baked goods Poppy Seed-Eggs, bread, cheese, fruit dressings, baked goods, noodles, vegetables, cottage  [employer], poultry, meat, fish, cauliflower, turnips,eggs bread Saffron-Rice, bread, veal, chicken, fish, eggs Sage-Meat, fish, poultry, onions, eggplant, tomateos, pork, stews Savory-Eggs, salads, poultry, meat, rice, vegetables, soups, pork Tarragon-Meat, poultry, fish, eggs, butter, vegetables (licorice-like flavor)  Thyme-Meat, poultry, fish, eggs, vegetables, (clover-like flavor), sauces, soups Tumeric-Salads, butter, eggs, fish, rice, vegetables (saffron-like flavor) Vanilla Extract-Baked goods, candy Vinegar-Salads, vegetables, meat marinades Walnut Extract-baked goods, candy  2. Choose your Foods Wisely   The following is a list of foods to avoid which are high in sodium:  Meats-Avoid all smoked, canned, salt cured, dried and kosher meat and fish as well as Anchovies   [employer] meats:Bologna, Liverwurst, Pastrami Canned meat or fish  Marinated  herring Caviar    Pepperoni Corned Beef   Pizza Dried chipped beef  Salami Frozen breaded fish or meat Salt pork Frankfurters or hot dogs  Sardines Gefilte fish   Sausage Ham (boiled ham, Proscuitto Smoked butt    spiced ham)   Spam      TV Dinners Vegetables Canned vegetables (Regular) Relish Canned mushrooms  Sauerkraut Olives    Tomato juice Pickles  Bakery and Dessert Products Canned puddings  Cream pies Cheesecake   Decorated cakes Cookies  Beverages/Juices Tomato juice, regular  Gatorade   V-8 vegetable juice, regular  Breads and Cereals Biscuit mixes   Salted potato chips, corn chips, pretzels Bread stuffing mixes  Salted crackers and rolls Pancake and waffle mixes Self-rising flour  Seasonings Accent    Meat sauces Barbecue sauce  Meat tenderizer Catsup    Monosodium glutamate (MSG) Celery salt   Onion salt Chili sauce   Prepared mustard Garlic salt   Salt, seasoned salt, sea salt Gravy mixes   Soy sauce Horseradish   Steak sauce Ketchup   Tartar sauce Lite salt    Teriyaki sauce Marinade mixes   Worcestershire sauce  Others Baking powder   Cocoa and cocoa mixes Baking soda   Commercial casserole mixes Candy-caramels, chocolate  Dehydrated soups    Bars, fudge,nougats  Instant rice and pasta mixes Canned broth or soup  Maraschino cherries Cheese, aged and processed cheese and cheese spreads  Learning Assessment Quiz  Indicated T (for True) or F (for False) for each of the following statements:  1. _____ Fresh fruits and vegetables and unprocessed grains are generally low in sodium 2. _____ Water may contain a considerable amount of sodium, depending on the source 3. _____ You can always tell if a food is high in sodium by tasting it 4. _____ Certain laxatives my be high in sodium and should be avoided unless prescribed   by a physician or pharmacist 5. _____ Salt substitutes may be used freely by anyone on a sodium restricted diet 6. _____  Sodium is  present in table salt, food additives and as a natural component of   most foods 7. _____ Table salt is approximately 90% sodium 8. _____ Limiting sodium intake may help prevent excess fluid accumulation in the body 9. _____ On a sodium-restricted diet, seasonings such as bouillon soy sauce, and    cooking wine should be used in place of table salt 10. _____ On an ingredient list, a product which lists monosodium glutamate as the first   ingredient is an appropriate food to include on a low sodium diet  Circle the best answer(s) to the following statements (Hint: there may be more than one correct answer)  11. On a low-sodium diet, some acceptable snack items are:    A. Olives  F. Bean dip   K. Grapefruit juice    B. Salted Pretzels G. Commercial Popcorn   L. Canned peaches    C. Carrot Sticks  H. Bouillon   M. Unsalted nuts   D. [place] fries  I. Peanut butter crackers N. Salami   E. Sweet pickles J. Tomato Juice   O. Pizza  12.  Seasonings that may be used freely on a reduced - sodium diet include   A. Lemon wedges F.Monosodium glutamate K. Celery seed    B.Soysauce   G. Pepper   L. Mustard powder   C. Sea salt  H. Cooking wine  M. Onion flakes   D. Vinegar  E. Prepared horseradish N. Salsa   E. Sage   J. Worcestershire sauce  O. [address]      Elson Clan, PA-C  10/04/2020 10:29 AM    [facility] [address] [place], [place], [place]  [address] Phone: [phone]; Fax: [phone]

## 2020-10-03 ENCOUNTER — Encounter: Payer: BC Managed Care – PPO | Admitting: Internal Medicine

## 2020-10-04 ENCOUNTER — Other Ambulatory Visit: Payer: Self-pay

## 2020-10-04 ENCOUNTER — Ambulatory Visit: Payer: BC Managed Care – PPO | Admitting: Physician Assistant

## 2020-10-04 ENCOUNTER — Encounter: Payer: Self-pay | Admitting: Physician Assistant

## 2020-10-04 VITALS — BP 150/90 | HR 65 | Ht 63.25 in | Wt 267.4 lb

## 2020-10-04 DIAGNOSIS — R0602 Shortness of breath: Secondary | ICD-10-CM | POA: Diagnosis not present

## 2020-10-04 DIAGNOSIS — I77810 Thoracic aortic ectasia: Secondary | ICD-10-CM | POA: Diagnosis not present

## 2020-10-04 DIAGNOSIS — Z9989 Dependence on other enabling machines and devices: Secondary | ICD-10-CM

## 2020-10-04 DIAGNOSIS — I10 Essential (primary) hypertension: Secondary | ICD-10-CM

## 2020-10-04 DIAGNOSIS — G4733 Obstructive sleep apnea (adult) (pediatric): Secondary | ICD-10-CM

## 2020-10-04 LAB — COMPREHENSIVE METABOLIC PANEL
ALT: 28 IU/L (ref 0–32)
AST: 24 IU/L (ref 0–40)
Albumin/Globulin Ratio: 1.4 (ref 1.2–2.2)
Albumin: 4.3 g/dL (ref 3.8–4.9)
Alkaline Phosphatase: 107 IU/L (ref 44–121)
BUN/Creatinine Ratio: 10 (ref 9–23)
BUN: 7 mg/dL (ref 6–24)
Bilirubin Total: 0.3 mg/dL (ref 0.0–1.2)
CO2: 26 mmol/L (ref 20–29)
Calcium: 9.4 mg/dL (ref 8.7–10.2)
Chloride: 106 mmol/L (ref 96–106)
Creatinine, Ser: 0.67 mg/dL (ref 0.57–1.00)
GFR calc Af Amer: 117 mL/min/{1.73_m2} (ref >59)
GFR calc non Af Amer: 101 mL/min/{1.73_m2} (ref >59)
Globulin, Total: 3.1 g/dL (ref 1.5–4.5)
Glucose: 91 mg/dL (ref 65–99)
Potassium: 4.4 mmol/L (ref 3.5–5.2)
Sodium: 143 mmol/L (ref 134–144)
Total Protein: 7.4 g/dL (ref 6.0–8.5)

## 2020-10-04 LAB — LIPID PANEL
Chol/HDL Ratio: 3.1 ratio (ref 0.0–4.4)
Cholesterol, Total: 181 mg/dL (ref 100–199)
HDL: 58 mg/dL (ref >39)
LDL Chol Calc (NIH): 103 mg/dL — ABNORMAL HIGH (ref 0–99)
Triglycerides: 111 mg/dL (ref 0–149)
VLDL Cholesterol Cal: 20 mg/dL (ref 5–40)

## 2020-10-04 NOTE — Patient Instructions (Signed)
Medication Instructions:  Your physician recommends that you continue on your current medications as directed. Please refer to the Current Medication list given to you today.  *If you need a refill on your cardiac medications before your next appointment, please call your pharmacy*   Lab Work: TODAY: FLP, CMET  If you have labs (blood work) drawn today and your tests are completely normal, you will receive your results only by: Marland Kitchen MyChart Message (if you have MyChart) OR . A paper copy in the mail If you have any lab test that is abnormal or we need to change your treatment, we will call you to review the results.   Testing/Procedures: Your physician has requested that you have an echocardiogram. Echocardiography is a painless test that uses sound waves to create images of your heart. It provides your doctor with information about the size and shape of your heart and how well your heart's chambers and valves are working. This procedure takes approximately one hour. There are no restrictions for this procedure.   Follow-Up: At [facility], you and your health needs are our priority.  As part of our continuing mission to provide you with exceptional heart care, we have created designated Provider Care Teams.  These Care Teams include your primary Cardiologist (physician) and Advanced Practice Providers (APPs -  Physician Assistants and Nurse Practitioners) who all work together to provide you with the care you need, when you need it.  We recommend signing up for the patient portal called "MyChart".  Sign up information is provided on this After Visit Summary.  MyChart is used to connect with patients for Virtual Visits (Telemedicine).  Patients are able to view lab/test results, encounter notes, upcoming appointments, etc.  Non-urgent messages can be sent to your provider as well.   To learn more about what you can do with MyChart, go to [email or web address].    Your next appointment:    1 year(s)  The format for your next appointment:   In Person  Provider:   You may see Tobias Alexander, MD or one of the following Advanced Practice Providers on your designated Care Team:    Ronie Spies, PA-C  Jacolyn Reedy, PA-C    Other Instructions Check your blood pressure for 2 weeks and send [place] the readings through your MyChart.  Your provider recommends that you maintain 150 minutes per week of moderate aerobic activity.  Two Gram Sodium Diet 2000 mg  What is Sodium? Sodium is a mineral found naturally in many foods. The most significant source of sodium in the diet is table salt, which is about 40% sodium.  Processed, convenience, and preserved foods also contain a large amount of sodium.  The body needs only 500 mg of sodium daily to function,  A normal diet provides more than enough sodium even if you do not use salt.  Why Limit Sodium? A build up of sodium in the body can cause thirst, increased blood pressure, shortness of breath, and water retention.  Decreasing sodium in the diet can reduce edema and risk of heart attack or stroke associated with high blood pressure.  Keep in mind that there are many other factors involved in these health problems.  Heredity, obesity, lack of exercise, cigarette smoking, stress and what you eat all play a role.  General Guidelines:  Do not add salt at the table or in cooking.  One teaspoon of salt contains over 2 grams of sodium.  Read food labels  Avoid processed and convenience  foods  Ask your dietitian before eating any foods not dicussed in the menu planning guidelines  Consult your physician if you wish to use a salt substitute or a sodium containing medication such as antacids.  Limit milk and milk products to 16 oz (2 cups) per day.  Shopping Hints:  READ LABELS!! "Dietetic" does not necessarily mean low sodium.  Salt and other sodium ingredients are often added to foods during processing.   Menu Planning  Guidelines Food Group Choose More Often Avoid  Beverages (see also the milk group All fruit juices, low-sodium, salt-free vegetables juices, low-sodium carbonated beverages Regular vegetable or tomato juices, commercially softened water used for drinking or cooking  Breads and Cereals Enriched white, wheat, rye and pumpernickel bread, hard rolls and dinner rolls; muffins, cornbread and waffles; most dry cereals, cooked cereal without added salt; unsalted crackers and breadsticks; low sodium or homemade bread crumbs Bread, rolls and crackers with salted tops; quick breads; instant hot cereals; pancakes; commercial bread stuffing; self-rising flower and biscuit mixes; regular bread crumbs or cracker crumbs  Desserts and Sweets Desserts and sweets mad with mild should be within allowance Instant pudding mixes and cake mixes  Fats Butter or margarine; vegetable oils; unsalted salad dressings, regular salad dressings limited to 1 Tbs; light, sour and heavy cream Regular salad dressings containing bacon fat, bacon bits, and salt pork; snack dips made with instant soup mixes or processed cheese; salted nuts  Fruits Most fresh, frozen and canned fruits Fruits processed with salt or sodium-containing ingredient (some dried fruits are processed with sodium sulfites        Vegetables Fresh, frozen vegetables and low- sodium canned vegetables Regular canned vegetables, sauerkraut, pickled vegetables, and others prepared in brine; frozen vegetables in sauces; vegetables seasoned with ham, bacon or salt pork  Condiments, Sauces, Miscellaneous  Salt substitute with physician's approval; pepper, herbs, spices; vinegar, lemon or lime juice; hot pepper sauce; garlic powder, onion powder, low sodium soy sauce (1 Tbs.); low sodium condiments (ketchup, chili sauce, mustard) in limited amounts (1 tsp.) fresh ground horseradish; unsalted tortilla chips, pretzels, potato chips, popcorn, salsa (1/4 cup) Any seasoning made  with salt including garlic salt, celery salt, onion salt, and seasoned salt; sea salt, rock salt, kosher salt; meat tenderizers; monosodium glutamate; mustard, regular soy sauce, barbecue, sauce, chili sauce, teriyaki sauce, steak sauce, Worcestershire sauce, and most flavored vinegars; canned gravy and mixes; regular condiments; salted snack foods, olives, picles, relish, horseradish sauce, catsup   Food preparation: Try these seasonings Meats:    Pork Sage, onion Serve with applesauce  Chicken Poultry seasoning, thyme, parsley Serve with cranberry sauce  Lamb Curry powder, rosemary, garlic, thyme Serve with mint sauce or jelly  Veal Marjoram, basil Serve with current jelly, cranberry sauce  Beef Pepper, bay leaf Serve with dry mustard, unsalted chive butter  Fish [place] leaf, dill Serve with unsalted lemon butter, unsalted parsley butter  Vegetables:    Asparagus Lemon juice   Broccoli Lemon juice   Carrots Mustard dressing parsley, mint, nutmeg, glazed with unsalted butter and sugar   Green beans Marjoram, lemon juice, nutmeg,dill seed   Tomatoes Basil, marjoram, onion   Spice /blend for [employer]" 4 tsp ground thyme 1 tsp ground sage 3 tsp ground rosemary 4 tsp ground marjoram   Test your knowledge 1. A product that says "Salt Free" may still contain sodium. True or False 2. Garlic Powder and Hot Pepper Sauce an be used as alternative seasonings.True or False 3. Processed  foods have more sodium than fresh foods.  True or False 4. Canned Vegetables have less sodium than froze True or False  WAYS TO DECREASE YOUR SODIUM INTAKE 1. Avoid the use of added salt in cooking and at the table.  Table salt (and other prepared seasonings which contain salt) is probably one of the greatest sources of sodium in the diet.  Unsalted foods can gain flavor from the sweet, sour, and butter taste sensations of herbs and spices.  Instead of using salt for seasoning, try the following seasonings with the  foods listed.  Remember: how you use them to enhance natural food flavors is limited only by your creativity... Allspice-Meat, fish, eggs, fruit, peas, red and yellow vegetables Almond Extract-Fruit baked goods Anise Seed-Sweet breads, fruit, carrots, beets, cottage cheese, cookies (tastes like licorice) Basil-Meat, fish, eggs, vegetables, rice, vegetables salads, soups, sauces Bay Leaf-Meat, fish, stews, poultry Burnet-Salad, vegetables (cucumber-like flavor) Caraway Seed-Bread, cookies, cottage cheese, meat, vegetables, cheese, rice Cardamon-Baked goods, fruit, soups Celery Powder or seed-Salads, salad dressings, sauces, meatloaf, soup, bread.Do not use  celery salt Chervil-Meats, salads, fish, eggs, vegetables, cottage cheese (parsley-like flavor) Chili Power-Meatloaf, chicken cheese, corn, eggplant, egg dishes Chives-Salads cottage cheese, egg dishes, soups, vegetables, sauces Cilantro-Salsa, casseroles Cinnamon-Baked goods, fruit, pork, lamb, chicken, carrots Cloves-Fruit, baked goods, fish, pot roast, green beans, beets, carrots Coriander-Pastry, cookies, meat, salads, cheese (lemon-orange flavor) Cumin-Meatloaf, fish,cheese, eggs, cabbage,fruit pie (caraway flavor) [employer], fruit, eggs, fish, poultry, cottage cheese, vegetables Dill Seed-Meat, cottage cheese, poultry, vegetables, fish, salads, bread Fennel Seed-Bread, cookies, apples, pork, eggs, fish, beets, cabbage, cheese, Licorice-like flavor Garlic-(buds or powder) Salads, meat, poultry, fish, bread, butter, vegetables, potatoes.Do not  use garlic salt Ginger-Fruit, vegetables, baked goods, meat, fish, poultry Horseradish Root-Meet, vegetables, butter Lemon Juice or Extract-Vegetables, fruit, tea, baked goods, fish salads Mace-Baked goods fruit, vegetables, fish, poultry (taste like nutmeg) Maple Extract-Syrups Marjoram-Meat, chicken, fish, vegetables, breads, green salads (taste like Sage) Mint-Tea, lamb, sherbet,  vegetables, desserts, carrots, cabbage Mustard, Dry or Seed-Cheese, eggs, meats, vegetables, poultry Nutmeg-Baked goods, fruit, chicken, eggs, vegetables, desserts Onion Powder-Meat, fish, poultry, vegetables, cheese, eggs, bread, rice salads (Do not use   Onion salt) Orange Extract-Desserts, baked goods Oregano-Pasta, eggs, cheese, onions, pork, lamb, fish, chicken, vegetables, green salads Paprika-Meat, fish, poultry, eggs, cheese, vegetables Parsley Flakes-Butter, vegetables, meat fish, poultry, eggs, bread, salads (certain forms may   Contain sodium Pepper-Meat fish, poultry, vegetables, eggs Peppermint Extract-Desserts, baked goods Poppy Seed-Eggs, bread, cheese, fruit dressings, baked goods, noodles, vegetables, cottage  [employer], poultry, meat, fish, cauliflower, turnips,eggs bread Saffron-Rice, bread, veal, chicken, fish, eggs Sage-Meat, fish, poultry, onions, eggplant, tomateos, pork, stews Savory-Eggs, salads, poultry, meat, rice, vegetables, soups, pork Tarragon-Meat, poultry, fish, eggs, butter, vegetables (licorice-like flavor)  Thyme-Meat, poultry, fish, eggs, vegetables, (clover-like flavor), sauces, soups Tumeric-Salads, butter, eggs, fish, rice, vegetables (saffron-like flavor) Vanilla Extract-Baked goods, candy Vinegar-Salads, vegetables, meat marinades Walnut Extract-baked goods, candy  2. Choose your Foods Wisely   The following is a list of foods to avoid which are high in sodium:  Meats-Avoid all smoked, canned, salt cured, dried and kosher meat and fish as well as Anchovies   [employer] meats:Bologna, Liverwurst, Pastrami Canned meat or fish  Marinated herring Caviar    Pepperoni Corned Beef   Pizza Dried chipped beef  Salami Frozen breaded fish or meat Salt pork Frankfurters or hot dogs  Sardines Gefilte fish   Sausage Ham (boiled ham, Proscuitto Smoked butt    spiced  ham)   Spam      TV  Dinners Vegetables Canned vegetables (Regular) Relish Canned mushrooms  Sauerkraut Olives    Tomato juice Pickles  Bakery and Dessert Products Canned puddings  Cream pies Cheesecake   Decorated cakes Cookies  Beverages/Juices Tomato juice, regular  Gatorade   V-8 vegetable juice, regular  Breads and Cereals Biscuit mixes   Salted potato chips, corn chips, pretzels Bread stuffing mixes  Salted crackers and rolls Pancake and waffle mixes Self-rising flour  Seasonings Accent    Meat sauces Barbecue sauce  Meat tenderizer Catsup    Monosodium glutamate (MSG) Celery salt   Onion salt Chili sauce   Prepared mustard Garlic salt   Salt, seasoned salt, sea salt Gravy mixes   Soy sauce Horseradish   Steak sauce Ketchup   Tartar sauce Lite salt    Teriyaki sauce Marinade mixes   Worcestershire sauce  Others Baking powder   Cocoa and cocoa mixes Baking soda   Commercial casserole mixes Candy-caramels, chocolate  Dehydrated soups    Bars, fudge,nougats  Instant rice and pasta mixes Canned broth or soup  Maraschino cherries Cheese, aged and processed cheese and cheese spreads  Learning Assessment Quiz  Indicated T (for True) or F (for False) for each of the following statements:  1. _____ Fresh fruits and vegetables and unprocessed grains are generally low in sodium 2. _____ Water may contain a considerable amount of sodium, depending on the source 3. _____ You can always tell if a food is high in sodium by tasting it 4. _____ Certain laxatives my be high in sodium and should be avoided unless prescribed   by a physician or pharmacist 5. _____ Salt substitutes may be used freely by anyone on a sodium restricted diet 6. _____ Sodium is present in table salt, food additives and as a natural component of   most foods 7. _____ Table salt is approximately 90% sodium 8. _____ Limiting sodium intake may help prevent excess fluid accumulation in the body 9. _____ On a  sodium-restricted diet, seasonings such as bouillon soy sauce, and    cooking wine should be used in place of table salt 10. _____ On an ingredient list, a product which lists monosodium glutamate as the first   ingredient is an appropriate food to include on a low sodium diet  Circle the best answer(s) to the following statements (Hint: there may be more than one correct answer)  11. On a low-sodium diet, some acceptable snack items are:    A. Olives  F. Bean dip   K. Grapefruit juice    B. Salted Pretzels G. Commercial Popcorn   L. Canned peaches    C. Carrot Sticks  H. Bouillon   M. Unsalted nuts   D. [place] fries  I. Peanut butter crackers N. Salami   E. Sweet pickles J. Tomato Juice   O. Pizza  12.  Seasonings that may be used freely on a reduced - sodium diet include   A. Lemon wedges F.Monosodium glutamate K. Celery seed    B.Soysauce   G. Pepper   L. Mustard powder   C. Sea salt  H. Cooking wine  M. Onion flakes   D. Vinegar  E. Prepared horseradish N. Salsa   E. Sage   J. Worcestershire sauce  O. Chutney

## 2020-10-06 ENCOUNTER — Telehealth: Payer: Self-pay | Admitting: Physician Assistant

## 2020-10-06 MED ORDER — SPIRONOLACTONE 50 MG PO TABS
50.0000 mg | ORAL_TABLET | Freq: Every day | ORAL | 3 refills | Status: DC
Start: 2020-10-06 — End: 2022-02-05

## 2020-10-06 NOTE — Telephone Encounter (Signed)
The patient has been notified of the result and verbalized understanding.  All questions (if any) were answered.  Patient would also like to let ML, PA know her BP readings as requested.  12/9  165/92 12/10  160/92  [place] readings taken approximately 2 hours after her BP meds were taken.  Leanord Hawking, RN 10/06/2020 8:51 AM

## 2020-10-06 NOTE — Telephone Encounter (Signed)
Spoke with pt. She will increase her Spironolactone to 50mg  daily as instructed. New Rx sent in for her.

## 2020-10-06 NOTE — Telephone Encounter (Signed)
BP too high. Increase spironolactone 50 mg daily. Keep track of BP and call us if still high. Can see back in f/u in January. thanks

## 2020-10-06 NOTE — Telephone Encounter (Signed)
° °  Pt is calling to get lab result °

## 2020-10-09 ENCOUNTER — Other Ambulatory Visit: Payer: Self-pay | Admitting: Allergy

## 2020-10-09 NOTE — Telephone Encounter (Signed)
Attempted to call the pt but unable to leave a message. Will try again later and sent the pt a My Chart to call our office re: some med changes per Leda Gauze PA.   "Can you ask patient to increase her spironolactone 50 mg daily bmet in 2 weeks. Check BP 2 hrs after taking meds. Thanks"

## 2020-10-10 MED ORDER — HYDRALAZINE HCL 25 MG PO TABS: 25.0000 mg | ORAL_TABLET | Freq: Three times a day (TID) | ORAL | 3 refills | Status: DC

## 2020-10-10 NOTE — Telephone Encounter (Signed)
   Pt is returning call, she asked to call her on her work number

## 2020-10-10 NOTE — Telephone Encounter (Addendum)
Spoke with the pt and advised her Judie Petit Lenze's PA recommendation and she reports that she has been taking Spironolactone 50 mg daily since 10/06/20 and her BP ist still running... yesterday 157/98 and today 148/98 which was about 2 hours after taking her meds. Pt says she has had a dull headache the last few days.   I advised her that I will forward to Leda Gauze and ask if she still wants to continue with the same plan for the next 2 weeks until she has her labwork.   I asked the pt to continue to monitor.

## 2020-10-10 NOTE — Telephone Encounter (Signed)
Pt advised and will keep track of her BP and let us know in a week or two how it is running.    Please add hydralazine 25 mg tid. Thanks

## 2020-10-16 MED ORDER — HYDRALAZINE HCL 50 MG PO TABS: 50.0000 mg | ORAL_TABLET | Freq: Three times a day (TID) | ORAL | 3 refills | Status: DC

## 2020-11-03 ENCOUNTER — Ambulatory Visit (HOSPITAL_COMMUNITY): Payer: BC Managed Care – PPO | Attending: Cardiovascular Disease

## 2020-11-03 ENCOUNTER — Other Ambulatory Visit: Payer: Self-pay

## 2020-11-03 DIAGNOSIS — I77810 Thoracic aortic ectasia: Secondary | ICD-10-CM | POA: Diagnosis not present

## 2020-11-03 LAB — ECHOCARDIOGRAM COMPLETE
Area-P 1/2: 3.72 cm2
S' Lateral: 2.6 cm

## 2020-11-03 MED ORDER — PERFLUTREN LIPID MICROSPHERE
1.0000 mL | INTRAVENOUS | Status: AC | PRN
  Administered 2020-11-03: 1 mL via INTRAVENOUS

## 2020-12-07 ENCOUNTER — Encounter: Payer: Self-pay | Admitting: Allergy

## 2020-12-07 ENCOUNTER — Other Ambulatory Visit: Payer: Self-pay

## 2020-12-07 ENCOUNTER — Ambulatory Visit (INDEPENDENT_AMBULATORY_CARE_PROVIDER_SITE_OTHER): Payer: BC Managed Care – PPO | Admitting: Allergy

## 2020-12-07 VITALS — BP 132/88 | HR 66 | Temp 97.6°F | Ht 63.0 in | Wt 273.0 lb

## 2020-12-07 DIAGNOSIS — J3089 Other allergic rhinitis: Secondary | ICD-10-CM

## 2020-12-07 DIAGNOSIS — J4541 Moderate persistent asthma with (acute) exacerbation: Secondary | ICD-10-CM

## 2020-12-07 DIAGNOSIS — T7800XA Anaphylactic reaction due to unspecified food, initial encounter: Secondary | ICD-10-CM

## 2020-12-07 DIAGNOSIS — H1013 Acute atopic conjunctivitis, bilateral: Secondary | ICD-10-CM | POA: Diagnosis not present

## 2020-12-07 DIAGNOSIS — T7800XD Anaphylactic reaction due to unspecified food, subsequent encounter: Secondary | ICD-10-CM | POA: Diagnosis not present

## 2020-12-07 MED ORDER — AZELASTINE-FLUTICASONE 137-50 MCG/ACT NA SUSP: 1.0000 | Freq: Two times a day (BID) | NASAL | 2 refills | Status: DC

## 2020-12-07 MED ORDER — EPINEPHRINE 0.3 MG/0.3ML IJ SOAJ: INTRAMUSCULAR | 2 refills | Status: DC

## 2020-12-07 MED ORDER — MONTELUKAST SODIUM 10 MG PO TABS: 10.0000 mg | ORAL_TABLET | Freq: Every day | ORAL | 2 refills | Status: DC

## 2020-12-07 MED ORDER — FAMOTIDINE 20 MG PO TABS: ORAL_TABLET | ORAL | 2 refills | Status: DC

## 2020-12-07 NOTE — Progress Notes (Signed)
Follow-up Note  RE: Jessica Snow MRN: [id number] DOB: March 28, 1966 Date of Office Visit: 12/07/2020   History of present illness: Jessica Snow is a 55 y.o. female presenting today for follow-up of cough variant asthma, allergic rhinitis with conjunctivitis and adverse food reaction.  She was last seen in the office on 08/16/2020 by myself. She states she started wheezing this week.  She believes it is due to the change in the weather that triggered this.  She states she has not used her albuterol inhaler though for these symptoms.  She is taking Symbicort.  She states she has taken an additional puff of symbicort Monday morning due to wheezing.  She still takes singulair daily.  She also reports she has been more winded especially with activity lately.   She does report congestion, itchy throat and ear but states that the allergy medications have been more helpful than what she has tried in the past. She is taking xyzal daily as well as using dymista which she states both work much better than previous antihistamines and nasal sprays she has tried before.  She continues to avoid honey and wheat.  She does have access to epipen.       Review of systems: Review of Systems  Constitutional: Negative.   HENT:       See HPI  Eyes: Negative.   Respiratory:       See HPI  Cardiovascular: Negative.   Gastrointestinal: Negative.   Musculoskeletal: Negative.   Skin: Negative.   Neurological: Negative.     All other systems negative unless noted above in HPI  Past medical/social/surgical/family history have been reviewed and are unchanged unless specifically indicated below.  No changes  Medication List: Current Outpatient Medications  Medication Sig Dispense Refill  . albuterol (PROVENTIL) (2.5 MG/3ML) 0.083% nebulizer solution 1 vial in neb every 4 hours as needed    . albuterol (VENTOLIN HFA) 108 (90 Base) MCG/ACT inhaler TAKE 2 PUFFS BY MOUTH EVERY 4 TO 6 HOURS AS NEEDED 8.5  Inhaler 11  . Azelastine-Fluticasone 137-50 MCG/ACT SUSP Place 1 spray into the nose 2 (two) times daily. 23 g 3  . budesonide-formoterol (SYMBICORT) 160-4.5 MCG/ACT inhaler Inhale 2 puffs into the lungs 2 (two) times daily. 1 Inhaler 3  . EPINEPHrine 0.3 mg/0.3 mL IJ SOAJ injection SMARTSIG:0.3 Milligram(s) IM Once PRN    . famotidine (PEPCID) 20 MG tablet One at bedtime 30 tablet 11  . hydrALAZINE (APRESOLINE) 50 MG tablet Take 1 tablet (50 mg total) by mouth 3 (three) times daily. 270 tablet 3  . levocetirizine (XYZAL) 5 MG tablet TAKE 1 TABLET BY MOUTH EVERY DAY IN THE EVENING 90 tablet 1  . montelukast (SINGULAIR) 10 MG tablet Take 1 tablet (10 mg total) by mouth at bedtime. 30 tablet 11  . nystatin (NYSTATIN) powder Apply 1 application topically 3 (three) times daily. 15 g 0  . nystatin cream (MYCOSTATIN) Apply 1 application topically 2 (two) times daily. 30 g 2  . olmesartan (BENICAR) 40 MG tablet TAKE 1 TABLET BY MOUTH EVERY DAY 90 tablet 1  . pantoprazole (PROTONIX) 40 MG tablet Take 1 tablet by mouth daily 180 tablet 0  . Semaglutide-Weight Management (WEGOVY) 1.7 MG/0.75ML SOAJ Inject 1.7 mg into the skin once a week. 3 mL 0  . spironolactone (ALDACTONE) 50 MG tablet Take 1 tablet (50 mg total) by mouth daily. 90 tablet 3  . Semaglutide, 1 MG/DOSE, (OZEMPIC, 1 MG/DOSE,) 2 MG/1.5ML SOPN Inject 0.75 mLs (  1 mg total) into the skin once a week. 1 pen 5   No current facility-administered medications for this visit.     Known medication allergies: Allergies  Allergen Reactions  . Honey Bee Treatment [Bee Venom]   . Wheat Bran      Physical examination: Blood pressure 132/88, pulse 66, temperature 97.6 F (36.4 C), height 5\' 3"  (1.6 m), weight 273 lb (123.8 kg), last menstrual period 12/19/2005, SpO2 98 %.  General: Alert, interactive, in no acute distress. HEENT: PERRLA, TMs pearly gray, turbinates moderately edematous without discharge, post-pharynx non  erythematous. Neck: Supple without lymphadenopathy. Lungs: Mildly decreased breath sounds bilaterally without wheezing, rhonchi or rales. {no increased work of breathing. CV: Normal S1, S2 without murmurs. Abdomen: Nondistended, nontender. Skin: Warm and dry, without lesions or rashes. Extremities:  No clubbing, cyanosis or edema. Neuro:   Grossly intact.  Diagnositics/Labs: Spirometry: FEV1: 1.26L 58%, FVC: 1.59L 58% predicted.  Status post albuterol she had an 8% increase in FEV1 to 1.36 L or 62% which is not a significant response.   Assessment and plan:   sthma with exacerbation  - continue Symbicort 12/21/2005 2 puffs twice a day with spacer device  - have access to albuterol inhaler 2 puffs every 4-6 hours as needed for cough/wheeze/shortness of breath/chest tightness.  May use 15-20 minutes prior to activity.   Monitor frequency of use.    - continue Singulair 10mg  daily at bedtime  - take prednisone pack as directed  Asthma control goals:   Full participation in all desired activities (may need albuterol before activity)  Albuterol use two time or less a week on average (not counting use with activity)  Cough interfering with sleep two time or less a month  Oral steroids no more than once a year  No hospitalizations  Allergic rhinitis with conjunctivitis  - continue allergen avoidance measures for tree pollen and mold  - continue Xyzal 5mg  daily.    - continue singulair as above  - continue dymista 1 spray each nostril twice a day.  This is a combination nasal spray with Flonase + Astelin (nasal antihistamine).  This helps with both nasal congestion and drainage.   - recommend use of nasal saline rinses to to help keep nose/sinuses flushed out of any pollen, germs, particles.  Use rinse with either distilled water or boil water and bring to room temperate.  Breathe thru your mouth while performing rinse.    Food allergy vs intolerance  - you have had serum IgE detectable  levels to wheat and honey however they are low  - skin testing for food allergens is negative to all foods  - continue avoidance of honey and wheat in the diet  - have access to self-injectable epinephrine (Epipen or AuviQ) 0.3mg  at all times  - follow emergency action plan in case of allergic reaction  Follow-up 3-4 months or sooner if needed  I appreciate the opportunity to take part in Logyn's care. Please do not hesitate to contact me with questions.  Sincerely,   , MD Allergy/Immunology [facility]

## 2020-12-07 NOTE — Patient Instructions (Addendum)
Cough variant asthma with exacerbation  - continue Symbicort 2 puffs twice a day with spacer device  - have access to albuterol inhaler 2 puffs every 4-6 hours as needed for cough/wheeze/shortness of breath/chest tightness.  May use 15-20 minutes prior to activity.   Monitor frequency of use.    - continue Singulair 10mg  daily at bedtime  - take prednisone pack as directed  Asthma control goals:   Full participation in all desired activities (may need albuterol before activity)  Albuterol use two time or less a week on average (not counting use with activity)  Cough interfering with sleep two time or less a month  Oral steroids no more than once a year  No hospitalizations  Allergies   - continue allergen avoidance measures for tree pollen and mold  - continue Xyzal 5mg  daily.    - continue singulair as above  - continue dymista 1 spray each nostril twice a day.  This is a combination nasal spray with Flonase + Astelin (nasal antihistamine).  This helps with both nasal congestion and drainage.   - recommend use of nasal saline rinses to to help keep nose/sinuses flushed out of any pollen, germs, particles.  Use rinse with either distilled water or boil water and bring to room temperate.  Breathe thru your mouth while performing rinse.    Food allergy vs intolerance  - you have had serum IgE detectable levels to wheat and honey however they are low  - skin testing for food allergens is negative to all foods  - continue avoidance of honey and wheat in the diet  - have access to self-injectable epinephrine (Epipen or AuviQ) 0.3mg  at all times  - follow emergency action plan in case of allergic reaction  Follow-up 3-4 months or sooner if needed

## 2020-12-20 ENCOUNTER — Other Ambulatory Visit: Payer: Self-pay | Admitting: Allergy

## 2021-01-04 ENCOUNTER — Telehealth: Payer: Self-pay | Admitting: Cardiology

## 2021-01-04 NOTE — Telephone Encounter (Signed)
Add on for tomorrow at 8am with Dr. Mayford Knife.

## 2021-01-05 ENCOUNTER — Other Ambulatory Visit: Payer: Self-pay

## 2021-01-05 ENCOUNTER — Telehealth (INDEPENDENT_AMBULATORY_CARE_PROVIDER_SITE_OTHER): Payer: BC Managed Care – PPO | Admitting: Cardiology

## 2021-01-05 VITALS — Ht 63.0 in | Wt 277.0 lb

## 2021-01-05 DIAGNOSIS — G4733 Obstructive sleep apnea (adult) (pediatric): Secondary | ICD-10-CM | POA: Diagnosis not present

## 2021-01-05 DIAGNOSIS — Z9989 Dependence on other enabling machines and devices: Secondary | ICD-10-CM | POA: Diagnosis not present

## 2021-01-05 DIAGNOSIS — I10 Essential (primary) hypertension: Secondary | ICD-10-CM

## 2021-01-05 NOTE — Patient Instructions (Signed)
Medication Instructions:  Your physician recommends that you continue on your current medications as directed. Please refer to the Current Medication list given to you today.  *If you need a refill on your cardiac medications before your next appointment, please call your pharmacy*   Lab Work: none If you have labs (blood work) drawn today and your tests are completely normal, you will receive your results only by: Marland Kitchen MyChart Message (if you have MyChart) OR . A paper copy in the mail If you have any lab test that is abnormal or we need to change your treatment, we will call you to review the results.   Testing/Procedures: none   Follow-Up: At [facility], you and your health needs are our priority.  As part of our continuing mission to provide you with exceptional heart care, we have created designated Provider Care Teams.  These Care Teams include your primary Cardiologist (physician) and Advanced Practice Providers (APPs -  Physician Assistants and Nurse Practitioners) who all work together to provide you with the care you need, when you need it.  We recommend signing up for the patient portal called "MyChart".  Sign up information is provided on this After Visit Summary.  MyChart is used to connect with patients for Virtual Visits (Telemedicine).  Patients are able to view lab/test results, encounter notes, upcoming appointments, etc.  Non-urgent messages can be sent to your provider as well.   To learn more about what you can do with MyChart, go to [email or web address].    Your next appointment:   3 month(s)  The format for your next appointment:   Virtual Visit   Provider:   Armanda Magic, MD   Other Instructions Please call the office to schedule appointment for June 2022

## 2021-01-05 NOTE — Progress Notes (Signed)
Virtual Visit via Audio Note   This visit type was conducted due to national recommendations for restrictions regarding the COVID-19 Pandemic (e.g. social distancing) in an effort to limit this patient's exposure and mitigate transmission in our community.  Due to her co-morbid illnesses, this patient is at least at moderate risk for complications without adequate follow up.  This format is felt to be most appropriate for this patient at this time.  All issues noted in this document were discussed and addressed.  A limited physical exam was performed with this format.  Please refer to the patient's chart for her consent to telehealth for [facility].   Evaluation Performed:  Follow-up visit  This visit type was conducted due to national recommendations for restrictions regarding the COVID-19 Pandemic (e.g. social distancing).  This format is felt to be most appropriate for this patient at this time.  All issues noted in this document were discussed and addressed.  No physical exam was performed (except for noted visual exam findings with Video Visits).  Please refer to the patient's chart (MyChart message for video visits and phone note for telephone visits) for the patient's consent to telehealth for [facility]  Date:  01/05/2021   ID:  Jessica Snow, DOB December 22, 1965, MRN [id number]  Patient Location:  Home  Provider location:   [place]  PCP:  Dorothyann Peng, MD  Cardiologist:  Tobias Alexander, MD  Sleep Medicine:  Armanda Magic, MD Electrophysiologist:  None   Chief Complaint:  OSA  History of Present Illness:    Jessica Snow is a 55 y.o. female who presents via audio/video conferencing for a telehealth visit today.    This is a 55yo female with a hx of excessive daytime sleepiness, poor quality sleep,  fatigue, HTN, morning headaches, obesity and having to catnap during the day. She was referred for sleep study by Tereso Newcomer, PA.  She underwent PSG showing moderate  OSA with an AHI of 14.9/hr and nocturnal hypoxemia with lowest O2 sat of 80%.  She underwent CPAP titration to 16cm H2O and is now here for followup.    She does well with her CPAP device when she uses it but unfortunately when she gets home at night she is very tired and a lot of times lays on the bed and falls asleep before putting her device one.  She is motivated to use it though. She tolerates the mask and feels the pressure is adequate.  Since going on CPAP she feels rested in the am and has no significant daytime sleepiness.  She denies any significant mouth or nasal dryness or nasal congestion.  She does not think that he snores.    The patient does not have symptoms concerning for COVID-19 infection (fever, chills, cough, or new shortness of breath).   Prior CV studies:   The following studies were reviewed today:  PAP compliance download  Past Medical History:  Diagnosis Date  . Ascending aorta dilatation (HCC)   . Asthma   . Chronic diastolic CHF (congestive heart failure) (HCC)   . Chronic rhinitis   . Cough   . Edema, lower extremity   . GERD (gastroesophageal reflux disease)   . HTN (hypertension)   . Mild aortic insufficiency   . Morbid obesity (HCC)   . Murmur   . Pneumonia   . Pre-diabetes    Past Surgical History:  Procedure Laterality Date  . ANKLE RECONSTRUCTION    . TONSILLECTOMY    . TOTAL  VAGINAL HYSTERECTOMY  2007     Current Meds  Medication Sig  . albuterol (VENTOLIN HFA) 108 (90 Base) MCG/ACT inhaler TAKE 2 PUFFS BY MOUTH EVERY 4 TO 6 HOURS AS NEEDED  . Azelastine-Fluticasone 137-50 MCG/ACT SUSP Place 1 spray into the nose 2 (two) times daily.  Marland Kitchen EPINEPHrine 0.3 mg/0.3 mL IJ SOAJ injection SMARTSIG:0.3 Milligram(s) IM Once PRN  . famotidine (PEPCID) 20 MG tablet One at bedtime  . hydrALAZINE (APRESOLINE) 50 MG tablet Take 1 tablet (50 mg total) by mouth 3 (three) times daily.  Marland Kitchen levocetirizine (XYZAL) 5 MG tablet TAKE 1 TABLET BY MOUTH EVERY DAY IN  THE EVENING  . montelukast (SINGULAIR) 10 MG tablet Take 1 tablet (10 mg total) by mouth at bedtime.  Marland Kitchen nystatin (NYSTATIN) powder Apply 1 application topically 3 (three) times daily.  Marland Kitchen nystatin cream (MYCOSTATIN) Apply 1 application topically 2 (two) times daily.  Marland Kitchen olmesartan (BENICAR) 40 MG tablet TAKE 1 TABLET BY MOUTH EVERY DAY  . pantoprazole (PROTONIX) 40 MG tablet Take 1 tablet by mouth daily  . spironolactone (ALDACTONE) 50 MG tablet Take 1 tablet (50 mg total) by mouth daily.  . SYMBICORT 160-4.5 MCG/ACT inhaler TAKE 2 PUFFS BY MOUTH TWICE A DAY     Allergies:   Honey bee treatment [bee venom] and Wheat bran   Social History   Tobacco Use  . Smoking status: Former Smoker    Packs/day: 0.25    Years: 10.00    Pack years: 2.50    Types: Cigarettes    Quit date: 01/26/2018    Years since quitting: 2.9  . Smokeless tobacco: Former Clinical biochemist  . Vaping Use: Never used  Substance Use Topics  . Alcohol use: No  . Drug use: No     Family Hx: The patient's family history includes Anemia in her father; Diabetes in her mother; Heart disease in her mother; Hypertension in her father and mother; Obesity in her mother; Sleep apnea in her mother.  ROS:   Please see the history of present illness.     All other systems reviewed and are negative.   Labs/Other Tests and Data Reviewed:    Recent Labs: 10/04/2020: ALT 28; BUN 7; Creatinine, Ser 0.67; Potassium 4.4; Sodium 143   Recent Lipid Panel Lab Results  Component Value Date/Time   CHOL 181 10/04/2020 10:27 AM   TRIG 111 10/04/2020 10:27 AM   HDL 58 10/04/2020 10:27 AM   CHOLHDL 3.1 10/04/2020 10:27 AM   LDLCALC 103 (H) 10/04/2020 10:27 AM    Wt Readings from Last 3 Encounters:  01/05/21 277 lb (125.6 kg)  12/07/20 273 lb (123.8 kg)  10/04/20 267 lb 6.4 oz (121.3 kg)     Objective:    Vital Signs:  Ht 5\' 3"  (1.6 m)   Wt 277 lb (125.6 kg)   LMP 12/19/2005   BMI 49.07 kg/m    Well nourished, well  developed female in no acute distress. Well appearing, alert and conversant, regular work of breathing,  good skin color  Eyes- anicteric mouth- oral mucosa is pink  neuro- grossly intact skin- no apparent rash or lesions or cyanosis   ASSESSMENT & PLAN:    1.  OSA -The patient is tolerating PAP therapy well without any problems.  The patient has been using and benefiting from PAP use and will continue to benefit from therapy.  -her main issue is remembering to put it on and instead lays on the bed and falls  asleep -I encouraged her to put the mask on before she lays on the bed even with naps -she is motivated to use it and feels much better when she uses it at night -I will see her back in 3 months to see how she is doing  2.  HTN -continue Olmesartan 40mg  daily and spiro 25mg  daily  3.  Morbid Obesity -I have encouraged her to get into a routine exercise program and cut back on carbs and portions.  -she is currently in a weight loss program with her PCP and is walking for exercise  COVID-19 Education: The signs and symptoms of COVID-19 were discussed with the patient and how to seek care for testing (follow up with PCP or arrange E-visit).  The importance of social distancing was discussed today.  Patient Risk:   After full review of this patient's clinical status, I feel that they are at least moderate risk at this time.  Time:   Today, I have spent 20 minutes on telemedicine discussing medical problems including OSA, HTN, Obeisty and reviewing patient's chart including PSG, CPAP titration and PAP compliance download on [employer] from DME.  Medication Adjustments/Labs and Tests Ordered: Current medicines are reviewed at length with the patient today.  Concerns regarding medicines are outlined above.  Tests Ordered: No orders of the defined types were placed in this encounter.  Medication Changes: No orders of the defined types were placed in this encounter.   Disposition:   Follow up in 1 year(s)  Signed, , MD  01/05/2021 8:28 AM    [facility]

## 2021-02-01 ENCOUNTER — Encounter: Payer: Self-pay | Admitting: Internal Medicine

## 2021-03-06 ENCOUNTER — Other Ambulatory Visit: Payer: Self-pay | Admitting: Allergy

## 2021-03-08 ENCOUNTER — Encounter: Payer: Self-pay | Admitting: Allergy

## 2021-03-08 ENCOUNTER — Ambulatory Visit (INDEPENDENT_AMBULATORY_CARE_PROVIDER_SITE_OTHER): Payer: BC Managed Care – PPO | Admitting: Allergy

## 2021-03-08 ENCOUNTER — Other Ambulatory Visit: Payer: Self-pay

## 2021-03-08 VITALS — BP 160/78 | HR 78 | Temp 98.2°F | Resp 14 | Ht 63.0 in | Wt 277.2 lb

## 2021-03-08 DIAGNOSIS — J4540 Moderate persistent asthma, uncomplicated: Secondary | ICD-10-CM

## 2021-03-08 DIAGNOSIS — T7800XA Anaphylactic reaction due to unspecified food, initial encounter: Secondary | ICD-10-CM

## 2021-03-08 DIAGNOSIS — H1013 Acute atopic conjunctivitis, bilateral: Secondary | ICD-10-CM | POA: Diagnosis not present

## 2021-03-08 DIAGNOSIS — J3089 Other allergic rhinitis: Secondary | ICD-10-CM | POA: Diagnosis not present

## 2021-03-08 MED ORDER — FAMOTIDINE 20 MG PO TABS: ORAL_TABLET | ORAL | 0 refills | Status: DC

## 2021-03-08 MED ORDER — ALBUTEROL SULFATE HFA 108 (90 BASE) MCG/ACT IN AERS: INHALATION_SPRAY | RESPIRATORY_TRACT | 3 refills | Status: DC

## 2021-03-08 MED ORDER — PANTOPRAZOLE SODIUM 40 MG PO TBEC: DELAYED_RELEASE_TABLET | ORAL | 0 refills | Status: AC

## 2021-03-08 NOTE — Progress Notes (Signed)
Follow-up Note  RE: Jessica Snow MRN: [id number] DOB: [birth year] Date of Office Visit: 03/08/2021   History of present illness: Jessica Snow is a 55 y.o. female presenting today for follow-up of asthma, allergic rhinitis and food allergy.  She was last seen in the office on 12/07/20 by myself.    She had a coughing attack today around 11am and states got to the point where she was gagging.   She used her symbicort with this and states it did help and she also drank some water.   She states around [place] Sunday she started having symptoms with increase in allergy and asthma symptoms.  States she had headache, facial pressure, tickling in throat, coughing, chest tightness and nighttime awakenings.  She has continued to have issues since then on and off.  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  She also believes the building likely has mold too.   She states she has been watching the pollen count and trying to stay home or indoors in the count is high.   She has been taking symbicort 2 puffs twice a day and singulair daily.  She is using albuterol as needed and has been using is more lately with the pollen exposures.   She continues to take xyzal and using generic dymista 1 spray each nostril twice a day.   She continues to avoid wheat and honey products and has not required use of her epinephrine device.   Review of systems: Review of Systems  Constitutional: Negative.   HENT: Positive for congestion.        Nasal drainage  Eyes: Negative.   Respiratory: Positive for cough and shortness of breath.   Cardiovascular: Negative.   Gastrointestinal: Negative.   Musculoskeletal: Negative.   Skin: Negative.   Neurological: Positive for headaches.    All other systems negative unless noted above in HPI  Past medical/social/surgical/family history have been reviewed and  are unchanged unless specifically indicated below.  No changes  Medication List: Current Outpatient Medications  Medication Sig Dispense Refill  . Azelastine-Fluticasone 137-50 MCG/ACT SUSP Place 1 spray into the nose 2 (two) times daily. 23 g 2  . EPINEPHrine 0.3 mg/0.3 mL IJ SOAJ injection SMARTSIG:0.3 Milligram(s) IM Once PRN 1 each 2  . levocetirizine (XYZAL) 5 MG tablet TAKE 1 TABLET BY MOUTH EVERY DAY IN THE EVENING 90 tablet 1  . montelukast (SINGULAIR) 10 MG tablet TAKE 1 TABLET BY MOUTH EVERYDAY AT BEDTIME 90 tablet 0  . nystatin (NYSTATIN) powder Apply 1 application topically 3 (three) times daily. 15 g 0  . nystatin cream (MYCOSTATIN) Apply 1 application topically 2 (two) times daily. 30 g 2  . olmesartan (BENICAR) 40 MG tablet TAKE 1 TABLET BY MOUTH EVERY DAY 90 tablet 1  . spironolactone (ALDACTONE) 50 MG tablet Take 1 tablet (50 mg total) by mouth daily. 90 tablet 3  . SYMBICORT 160-4.5 MCG/ACT inhaler TAKE 2 PUFFS BY MOUTH TWICE A DAY 30.6 each 1  . albuterol (VENTOLIN HFA) 108 (90 Base) MCG/ACT inhaler TAKE 2 PUFFS BY MOUTH EVERY 4 TO 6 HOURS AS NEEDED 18 g 3  . famotidine (PEPCID) 20 MG tablet TAKE 1 TABLET BY MOUTH EVERYDAY AT BEDTIME 90 tablet 0  . hydrALAZINE (APRESOLINE) 50 MG tablet Take 1 tablet (50 mg total) by mouth 3 (three) times daily. 270 tablet 3  .  pantoprazole (PROTONIX) 40 MG tablet Take 1 tablet by mouth daily 180 tablet 0  . Semaglutide-Weight Management (WEGOVY) 1.7 MG/0.75ML SOAJ Inject 1.7 mg into the skin once a week. (Patient not taking: No sig reported) 3 mL 0   No current facility-administered medications for this visit.     Known medication allergies: Allergies  Allergen Reactions  . Honey Bee Treatment [Bee Venom]   . Wheat Bran      Physical examination: Blood pressure (!) 160/78, pulse 78, temperature 98.2 F (36.8 C), temperature source Temporal, resp. rate 14, height 5\' 3"  (1.6 m), weight 277 lb 3.2 oz (125.7 kg), last menstrual  period 12/19/2005, SpO2 96 %.  General: Alert, interactive, in no acute distress. HEENT: PERRLA, TMs pearly gray, turbinates mildly edematous without discharge, post-pharynx unremarkable. Neck: Supple without lymphadenopathy. Lungs: Mildly decreased breath sounds bilaterally without wheezing, rhonchi or rales. {no increased work of breathing. CV: Normal S1, S2 without murmurs. Abdomen: Nondistended, nontender. Skin: Warm and dry, without lesions or rashes. Extremities:  No clubbing, cyanosis or edema. Neuro:   Grossly intact.  Diagnositics/Labs:  Spirometry: FEV1: 0.97L 45%, FVC: 1.22L 45%, ratio consistent with obstructive with restrictive pattern   Assessment and plan:   Cough variant asthma  - stop symbicort for now  - start Breztri 2 puffs twice a day and use with spacer device  - have access to albuterol inhaler 2 puffs every 4-6 hours as needed for cough/wheeze/shortness of breath/chest tightness.  May use 15-20 minutes prior to activity.   Monitor frequency of use.    - continue Singulair 10mg  daily at bedtime  - you continue to have symptoms despite breztri use then will need to consider stepping up to a asthma biologic injectable medication  Asthma control goals:   Full participation in all desired activities (may need albuterol before activity)  Albuterol use two time or less a week on average (not counting use with activity)  Cough interfering with sleep two time or less a month  Oral steroids no more than once a year  No hospitalizations  Allergic rhinitis with conjunctivitis  - continue allergen avoidance measures for tree pollen and mold  - continue Xyzal 5mg  daily.    - continue singulair as above  - continue Flonase + Astelin (nasal antihistamine) 1 spray each nostril twice a day.  This helps with both nasal congestion and drainage.   - recommend use of nasal saline rinses to to help keep nose/sinuses flushed out of any pollen, germs, particles.  Use rinse  with either distilled water or boil water and bring to room temperate.  Breathe thru your mouth while performing rinse.    Food allergy vs intolerance  - you have had serum IgE detectable levels to wheat and honey however they are low  - skin testing for food allergens is negative to all foods  - continue avoidance of honey and wheat in the diet  - have access to self-injectable epinephrine (Epipen or AuviQ) 0.3mg  at all times  - follow emergency action plan in case of allergic reaction  Follow-up 3 months or sooner if needed  I appreciate the opportunity to take part in Jaylean's care. Please do not hesitate to contact me with questions.  Sincerely,   12/21/2005, MD Allergy/Immunology [facility]

## 2021-03-08 NOTE — Patient Instructions (Addendum)
Cough variant asthma  - stop symbicort for now  - start Breztri 2 puffs twice a day and use with spacer device  - have access to albuterol inhaler 2 puffs every 4-6 hours as needed for cough/wheeze/shortness of breath/chest tightness.  May use 15-20 minutes prior to activity.   Monitor frequency of use.    - continue Singulair 10mg  daily at bedtime  - you continue to have symptoms despite breztri use then will need to consider stepping up to a asthma biologic injectable medication  Asthma control goals:   Full participation in all desired activities (may need albuterol before activity)  Albuterol use two time or less a week on average (not counting use with activity)  Cough interfering with sleep two time or less a month  Oral steroids no more than once a year  No hospitalizations  Allergies   - continue allergen avoidance measures for tree pollen and mold  - continue Xyzal 5mg  daily.    - continue singulair as above  - continue Flonase + Astelin (nasal antihistamine) 1 spray each nostril twice a day.  This helps with both nasal congestion and drainage.   - recommend use of nasal saline rinses to to help keep nose/sinuses flushed out of any pollen, germs, particles.  Use rinse with either distilled water or boil water and bring to room temperate.  Breathe thru your mouth while performing rinse.    Food allergy vs intolerance  - you have had serum IgE detectable levels to wheat and honey however they are low  - skin testing for food allergens is negative to all foods  - continue avoidance of honey and wheat in the diet  - have access to self-injectable epinephrine (Epipen or AuviQ) 0.3mg  at all times  - follow emergency action plan in case of allergic reaction  Follow-up 3 months or sooner if needed

## 2021-04-10 ENCOUNTER — Other Ambulatory Visit: Payer: Self-pay | Admitting: Allergy

## 2021-04-11 ENCOUNTER — Ambulatory Visit: Payer: BC Managed Care – PPO | Admitting: Cardiology

## 2021-04-12 ENCOUNTER — Other Ambulatory Visit: Payer: Self-pay | Admitting: Allergy

## 2021-06-08 ENCOUNTER — Ambulatory Visit: Payer: BC Managed Care – PPO | Admitting: Allergy

## 2021-07-07 ENCOUNTER — Other Ambulatory Visit: Payer: Self-pay | Admitting: Allergy

## 2021-07-16 ENCOUNTER — Ambulatory Visit: Payer: BC Managed Care – PPO | Admitting: Cardiology

## 2021-07-16 ENCOUNTER — Other Ambulatory Visit: Payer: Self-pay

## 2021-07-16 ENCOUNTER — Encounter: Payer: Self-pay | Admitting: Cardiology

## 2021-07-16 VITALS — BP 132/76 | HR 85 | Ht 63.0 in | Wt 277.0 lb

## 2021-07-16 DIAGNOSIS — G4733 Obstructive sleep apnea (adult) (pediatric): Secondary | ICD-10-CM

## 2021-07-16 DIAGNOSIS — I5032 Chronic diastolic (congestive) heart failure: Secondary | ICD-10-CM | POA: Diagnosis not present

## 2021-07-16 DIAGNOSIS — I10 Essential (primary) hypertension: Secondary | ICD-10-CM

## 2021-07-16 DIAGNOSIS — I77810 Thoracic aortic ectasia: Secondary | ICD-10-CM

## 2021-07-16 DIAGNOSIS — Z9989 Dependence on other enabling machines and devices: Secondary | ICD-10-CM

## 2021-07-16 NOTE — Addendum Note (Signed)
Addended by: Theresia Majors on: 07/16/2021 02:56 PM   Modules accepted: Orders

## 2021-07-16 NOTE — Patient Instructions (Addendum)
Medication Instructions:  Your physician recommends that you continue on your current medications as directed. Please refer to the Current Medication list given to you today.  *If you need a refill on your cardiac medications before your next appointment, please call your pharmacy*   Lab Work: TODAY: BMET and CBC If you have labs (blood work) drawn today and your tests are completely normal, you will receive your results only by: MyChart Message (if you have MyChart) OR A paper copy in the mail If you have any lab test that is abnormal or we need to change your treatment, we will call you to review the results.   Testing/Procedures: Your physician has requested that you have a cardiac MRI. Cardiac MRI uses a computer to create images of your heart as its beating, producing both still and moving pictures of your heart and major blood vessels. For further information please visit [email or web address]. Please follow the instruction sheet given to you today for more information.  Follow-Up: At [facility], you and your health needs are our priority.  As part of our continuing mission to provide you with exceptional heart care, we have created designated Provider Care Teams.  These Care Teams include your primary Cardiologist (physician) and Advanced Practice Providers (APPs -  Physician Assistants and Nurse Practitioners) who all work together to provide you with the care you need, when you need it.  Your next appointment:   1 year(s)  The format for your next appointment:   In Person  Provider:   Armanda Magic, MD   Other Instructions: Please visit the site below to sign up for a seminar to learn more about weight loss surgery: [email or web address]

## 2021-07-16 NOTE — Progress Notes (Signed)
Date:  07/16/2021   ID:  Jessica Snow, DOB [birth year], MRN [id number]  PCP:  Dorothyann Peng, MD  Sleep Medicine:  Armanda Magic, MD Electrophysiologist:  None   Chief Complaint:  OSA  History of Present Illness:    Jessica Snow is a 55 y.o. female  with a hx of excessive daytime sleepiness, poor quality sleep,  fatigue, HTN, morning headaches, obesity and having to catnap during the day. She was referred for sleep study by Tereso Newcomer, PA.  She underwent PSG showing moderate OSA with an AHI of 14.9/hr and nocturnal hypoxemia with lowest O2 sat of 80%.  She underwent CPAP titration to 16cm H2O.  She has had problems remembering to put her device on at night because she falls asleep before getting it on.    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  She is here today for followup and is doing well.  She denies any chest pain or pressure, SOB, DOE, PND, orthopnea, LE edema, dizziness, palpitations or syncope. She  is compliant with her meds and is tolerating meds with no SE.   She also tells me that she has not been compliant with her device.  She will get off work and be very tired and lay on couch and watch TV and then fall asleep without her CPAP.    Prior CV studies:   The following studies were reviewed today:  none  Past Medical History:  Diagnosis Date   Ascending aorta dilatation (HCC)    Asthma    Chronic diastolic CHF (congestive heart failure) (HCC)    Chronic rhinitis    Cough    Edema, lower extremity    GERD (gastroesophageal reflux disease)    HTN (hypertension)    Mild aortic insufficiency    Morbid obesity (HCC)    Murmur    Pneumonia    Pre-diabetes    Past Surgical History:  Procedure Laterality Date   ANKLE RECONSTRUCTION     TONSILLECTOMY     TOTAL VAGINAL HYSTERECTOMY  2007     Current Meds  Medication Sig    albuterol (VENTOLIN HFA) 108 (90 Base) MCG/ACT inhaler TAKE 2 PUFFS BY MOUTH EVERY 4 TO 6 HOURS AS NEEDED   Azelastine-Fluticasone 137-50 MCG/ACT SUSP PLACE 1 SPRAY INTO THE NOSE 2 (TWO) TIMES DAILY.   EPINEPHrine 0.3 mg/0.3 mL IJ SOAJ injection SMARTSIG:0.3 Milligram(s) IM Once PRN   famotidine (PEPCID) 20 MG tablet TAKE 1 TABLET BY MOUTH EVERYDAY AT BEDTIME   hydrALAZINE (APRESOLINE) 50 MG tablet Take 1 tablet (50 mg total) by mouth 3 (three) times daily.   levocetirizine (XYZAL) 5 MG tablet TAKE 1 TABLET BY MOUTH EVERY DAY IN THE EVENING   montelukast (SINGULAIR) 10 MG tablet TAKE 1 TABLET BY MOUTH EVERYDAY AT BEDTIME   nystatin (NYSTATIN) powder Apply 1 application topically 3 (three) times daily.   nystatin cream (MYCOSTATIN) Apply 1 application topically 2 (two) times daily.   olmesartan (BENICAR) 40 MG tablet TAKE 1 TABLET BY MOUTH EVERY DAY   pantoprazole (PROTONIX) 40 MG tablet Take 1 tablet by mouth daily   spironolactone (ALDACTONE) 50 MG tablet Take 1 tablet (50 mg total) by mouth daily.   SYMBICORT 160-4.5 MCG/ACT inhaler TAKE 2 PUFFS BY MOUTH TWICE A DAY     Allergies:   Honey bee treatment [bee  venom] and Wheat bran   Social History   Tobacco Use   Smoking status: Former    Packs/day: 0.25    Years: 10.00    Pack years: 2.50    Types: Cigarettes    Quit date: 01/26/2018    Years since quitting: 3.4   Smokeless tobacco: Former  Building services engineer Use: Never used  Substance Use Topics   Alcohol use: No   Drug use: No     Family Hx: The patient's family history includes Anemia in her father; Diabetes in her mother; Heart disease in her mother; Hypertension in her father and mother; Obesity in her mother; Sleep apnea in her mother.  ROS:   Please see the history of present illness.     All other systems reviewed and are negative.   Labs/Other Tests and Data Reviewed:    Recent Labs: 10/04/2020: ALT 28; BUN 7; Creatinine, Ser 0.67; Potassium 4.4; Sodium 143    Recent Lipid Panel Lab Results  Component Value Date/Time   CHOL 181 10/04/2020 10:27 AM   TRIG 111 10/04/2020 10:27 AM   HDL 58 10/04/2020 10:27 AM   CHOLHDL 3.1 10/04/2020 10:27 AM   LDLCALC 103 (H) 10/04/2020 10:27 AM    Wt Readings from Last 3 Encounters:  07/16/21 277 lb (125.6 kg)  03/08/21 277 lb 3.2 oz (125.7 kg)  01/05/21 277 lb (125.6 kg)     Objective:    Vital Signs:  BP 132/76   Pulse 85   Ht 5\' 3"  (1.6 m)   Wt 277 lb (125.6 kg)   LMP 12/19/2005   SpO2 94%   BMI 49.07 kg/m    GEN: Well nourished, well developed in no acute distress HEENT: Normal NECK: No JVD; No carotid bruits LYMPHATICS: No lymphadenopathy CARDIAC:RRR, no murmurs, rubs, gallops RESPIRATORY:  Clear to auscultation without rales, wheezing or rhonchi  ABDOMEN: Soft, non-tender, non-distended MUSCULOSKELETAL:  No edema; No deformity  SKIN: Warm and dry NEUROLOGIC:  Alert and oriented x 3 PSYCHIATRIC:  Normal affect   ASSESSMENT & PLAN:    1.  OSA  -she still forgets to put on her device when she comes home from work and lays on couch and falls asleep -encouraged her to go to her bedroom to lay down and put her mask off if she wants to watch TV -I will get a download from DME  2.  HTN -BP is adequately controlled on exam today -Continue prescription drug management with Olmesartan 40mg  daily, Hydralazine 50mg  TID and spiro 25mg  daily with PRN refills  -check BMET  3.  Morbid Obesity -I have encouraged her to get into a routine exercise program and cut back on carbs and portions.  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  Chronic diastolic CHF -she appears euvolemic on exam today -Continue prescription drug management with Spironolactone 50mg  daily   5.  Ascending aortic dilatation -48mm by Chest CTA 2020 -Will get cMRI to assess further  Medication Adjustments/Labs and Tests  Ordered: Current medicines are reviewed at length with the patient today.  Concerns regarding medicines are outlined above.  Tests Ordered: No orders of the defined types were placed in this encounter.  Medication Changes: No orders of the defined types were placed in this encounter.   Disposition:  Follow up in 1 year(s)  Signed, , MD  07/16/2021 2:50 PM  [facility]

## 2021-07-17 LAB — CBC
Hematocrit: 40.2 % (ref 34.0–46.6)
Hemoglobin: 12.7 g/dL (ref 11.1–15.9)
MCH: 25.1 pg — ABNORMAL LOW (ref 26.6–33.0)
MCHC: 31.6 g/dL (ref 31.5–35.7)
MCV: 79 fL (ref 79–97)
Platelets: 244 10*3/uL (ref 150–450)
RBC: 5.06 x10E6/uL (ref 3.77–5.28)
RDW: 15.4 % (ref 11.7–15.4)
WBC: 9.6 10*3/uL (ref 3.4–10.8)

## 2021-07-17 LAB — BASIC METABOLIC PANEL
BUN/Creatinine Ratio: 13 (ref 9–23)
BUN: 10 mg/dL (ref 6–24)
CO2: 26 mmol/L (ref 20–29)
Calcium: 9.4 mg/dL (ref 8.7–10.2)
Chloride: 103 mmol/L (ref 96–106)
Creatinine, Ser: 0.79 mg/dL (ref 0.57–1.00)
Glucose: 120 mg/dL — ABNORMAL HIGH (ref 65–99)
Potassium: 3.7 mmol/L (ref 3.5–5.2)
Sodium: 141 mmol/L (ref 134–144)
eGFR: 89 mL/min/{1.73_m2} (ref >59)

## 2021-07-22 ENCOUNTER — Encounter (HOSPITAL_COMMUNITY): Payer: Self-pay | Admitting: Emergency Medicine

## 2021-07-22 ENCOUNTER — Emergency Department (HOSPITAL_COMMUNITY)
Admission: EM | Admit: 2021-07-22 | Discharge: 2021-07-22 | Disposition: A | Discharge status: Home / Self Care | Payer: BC Managed Care – PPO | Attending: Emergency Medicine | Admitting: Emergency Medicine

## 2021-07-22 ENCOUNTER — Emergency Department (HOSPITAL_COMMUNITY): Payer: BC Managed Care – PPO

## 2021-07-22 DIAGNOSIS — J45909 Unspecified asthma, uncomplicated: Secondary | ICD-10-CM | POA: Diagnosis not present

## 2021-07-22 DIAGNOSIS — R112 Nausea with vomiting, unspecified: Secondary | ICD-10-CM | POA: Diagnosis not present

## 2021-07-22 DIAGNOSIS — Z87891 Personal history of nicotine dependence: Secondary | ICD-10-CM | POA: Insufficient documentation

## 2021-07-22 DIAGNOSIS — Z794 Long term (current) use of insulin: Secondary | ICD-10-CM | POA: Insufficient documentation

## 2021-07-22 DIAGNOSIS — I110 Hypertensive heart disease with heart failure: Secondary | ICD-10-CM | POA: Diagnosis not present

## 2021-07-22 DIAGNOSIS — Z7951 Long term (current) use of inhaled steroids: Secondary | ICD-10-CM | POA: Insufficient documentation

## 2021-07-22 DIAGNOSIS — I5032 Chronic diastolic (congestive) heart failure: Secondary | ICD-10-CM | POA: Insufficient documentation

## 2021-07-22 DIAGNOSIS — E119 Type 2 diabetes mellitus without complications: Secondary | ICD-10-CM | POA: Diagnosis not present

## 2021-07-22 DIAGNOSIS — Z79899 Other long term (current) drug therapy: Secondary | ICD-10-CM | POA: Insufficient documentation

## 2021-07-22 DIAGNOSIS — R55 Syncope and collapse: Secondary | ICD-10-CM

## 2021-07-22 DIAGNOSIS — I10 Essential (primary) hypertension: Secondary | ICD-10-CM

## 2021-07-22 LAB — CBC
HCT: 39.4 % (ref 36.0–46.0)
Hemoglobin: 12.5 g/dL (ref 12.0–15.0)
MCH: 25.7 pg — ABNORMAL LOW (ref 26.0–34.0)
MCHC: 31.7 g/dL (ref 30.0–36.0)
MCV: 80.9 fL (ref 80.0–100.0)
Platelets: 226 10*3/uL (ref 150–400)
RBC: 4.87 MIL/uL (ref 3.87–5.11)
RDW: 15.1 % (ref 11.5–15.5)
WBC: 6.1 10*3/uL (ref 4.0–10.5)
nRBC: 0 % (ref 0.0–0.2)

## 2021-07-22 LAB — COMPREHENSIVE METABOLIC PANEL
ALT: 35 U/L (ref 0–44)
AST: 33 U/L (ref 15–41)
Albumin: 3.8 g/dL (ref 3.5–5.0)
Alkaline Phosphatase: 73 U/L (ref 38–126)
Anion gap: 10 (ref 5–15)
BUN: 10 mg/dL (ref 6–20)
CO2: 24 mmol/L (ref 22–32)
Calcium: 9.1 mg/dL (ref 8.9–10.3)
Chloride: 105 mmol/L (ref 98–111)
Creatinine, Ser: 0.72 mg/dL (ref 0.44–1.00)
GFR, Estimated: >60 mL/min (ref >60)
Glucose, Bld: 104 mg/dL — ABNORMAL HIGH (ref 70–99)
Potassium: 3.3 mmol/L — ABNORMAL LOW (ref 3.5–5.1)
Sodium: 139 mmol/L (ref 135–145)
Total Bilirubin: 0.7 mg/dL (ref 0.3–1.2)
Total Protein: 7.7 g/dL (ref 6.5–8.1)

## 2021-07-22 LAB — CBG MONITORING, ED: Glucose-Capillary: 101 mg/dL — ABNORMAL HIGH (ref 70–99)

## 2021-07-22 LAB — I-STAT BETA HCG BLOOD, ED (MC, WL, AP ONLY): I-stat hCG, quantitative: <5 m[IU]/mL (ref <5)

## 2021-07-22 LAB — TROPONIN I (HIGH SENSITIVITY)
Troponin I (High Sensitivity): 12 ng/L (ref <18)
Troponin I (High Sensitivity): 18 ng/L — ABNORMAL HIGH (ref <18)

## 2021-07-22 IMAGING — DX DG CHEST 1V PORT
1 series · 1 of 1 positions shown · non-contrast
Comparison: [DATE]

CLINICAL DATA: CHF, syncope

EXAM:
PORTABLE CHEST 1 VIEW

[chest ap]
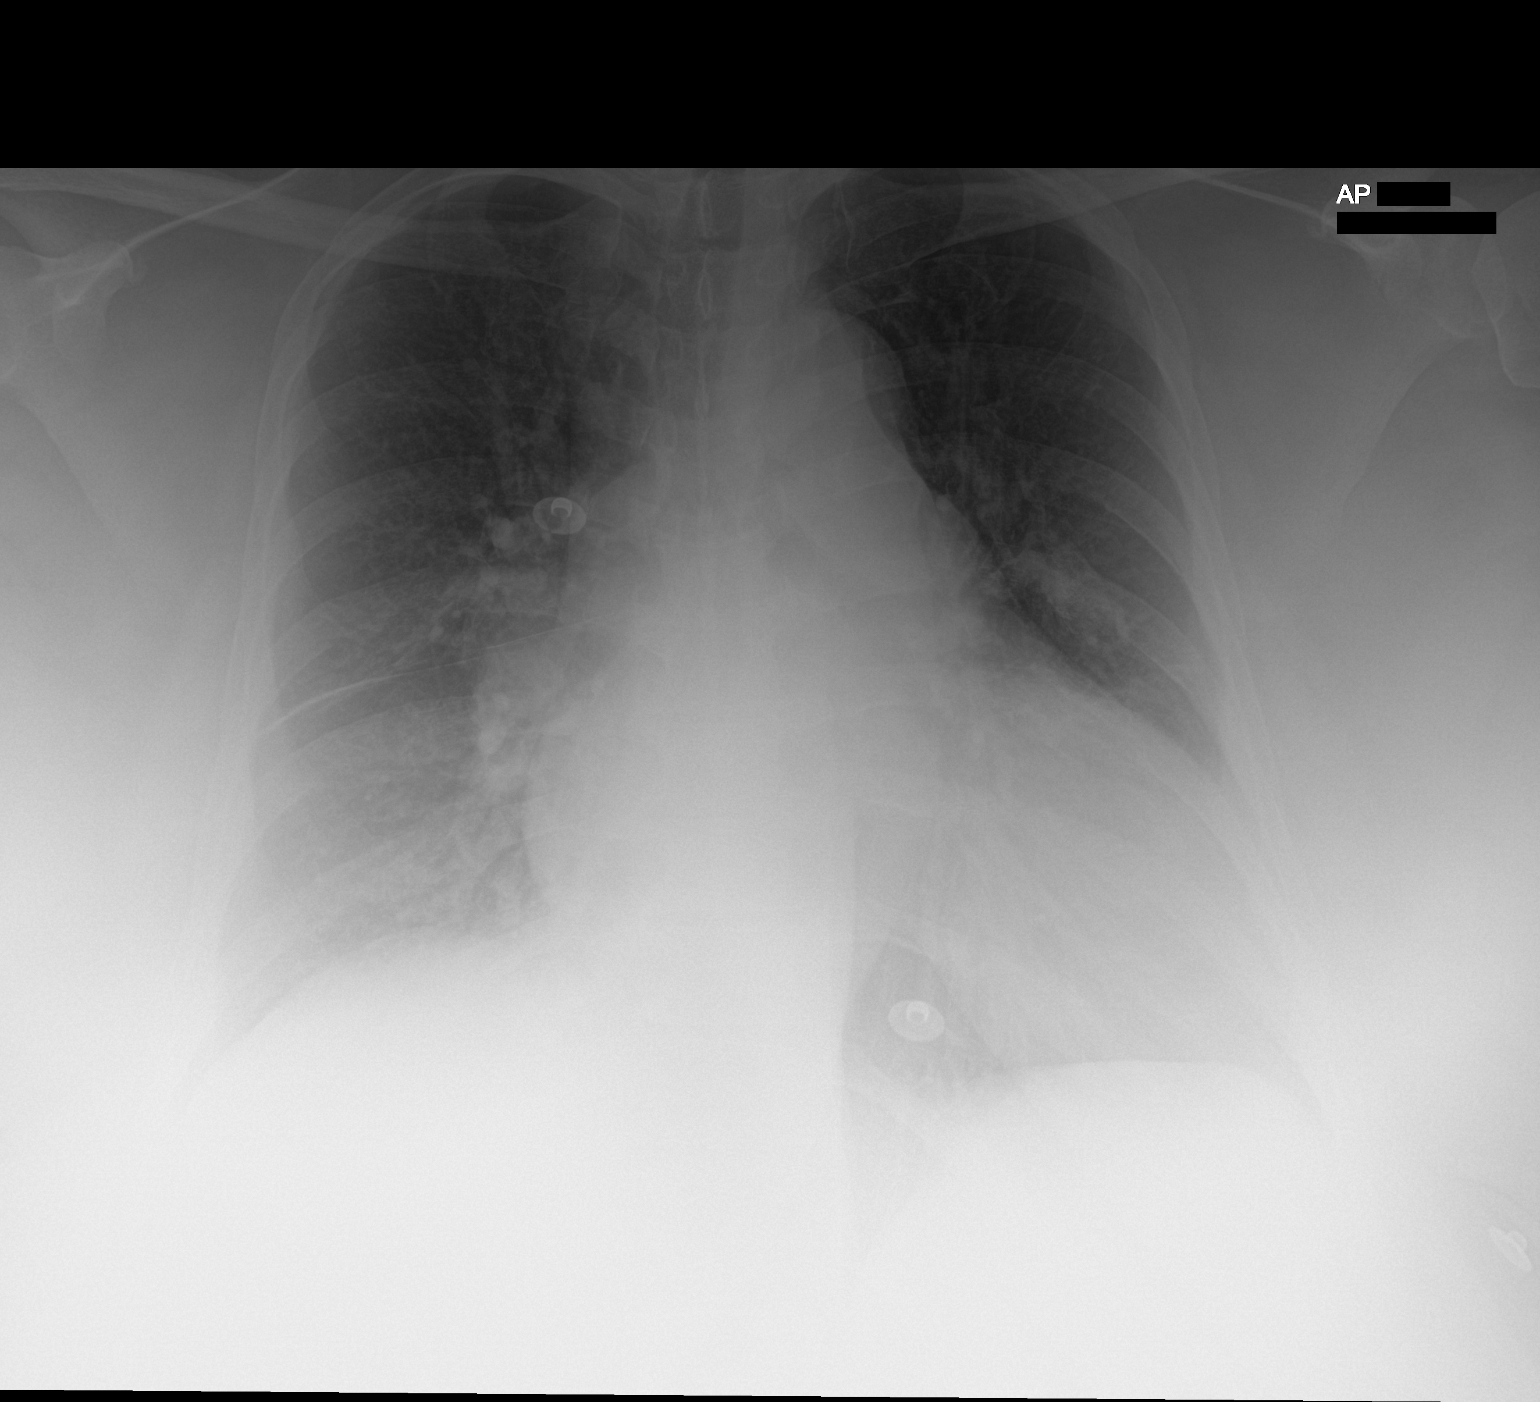

[1 of 1 positions shown; findings below may reference images not displayed]

FINDINGS: Stable cardiomegaly. Mild pulmonary vascular congestion with mildly
increased bibasilar interstitial markings. No pleural effusion or
pneumothorax.
IMPRESSION: Findings suggestive of CHF with mild interstitial edema.

## 2021-07-22 MED ORDER — ALUM & MAG HYDROXIDE-SIMETH 200-200-20 MG/5ML PO SUSP
30.0000 mL | Freq: Once | ORAL | Status: AC
  Administered 2021-07-22: 30 mL via ORAL
  Filled 2021-07-22: qty 30

## 2021-07-22 MED ORDER — LIDOCAINE VISCOUS HCL 2 % MT SOLN
15.0000 mL | Freq: Once | OROMUCOSAL | Status: AC
  Administered 2021-07-22: 15 mL via ORAL
  Filled 2021-07-22: qty 15

## 2021-07-22 MED ORDER — FUROSEMIDE 10 MG/ML IJ SOLN
60.0000 mg | Freq: Once | INTRAMUSCULAR | Status: AC
  Administered 2021-07-22: 60 mg via INTRAVENOUS
  Filled 2021-07-22: qty 8

## 2021-07-22 MED ORDER — ONDANSETRON 8 MG PO TBDP
8.0000 mg | ORAL_TABLET | Freq: Once | ORAL | Status: AC
  Administered 2021-07-22: 8 mg via ORAL
  Filled 2021-07-22: qty 1

## 2021-07-22 NOTE — ED Notes (Signed)
Pt reports she is supposed to be having "an MRI to see if her aorta is still enlarged" soon.

## 2021-07-22 NOTE — ED Provider Notes (Addendum)
[facility]-EMERGENCY DEPT Provider Note   CSN: [id number] Arrival date & time: 07/22/21  1242     History Chief Complaint  Patient presents with   Loss of Consciousness    Jessica Snow is a 55 y.o. female.  Jessica Snow has a history of diastolic heart failure, possible pulmonary hypertension, mild ascending aorta dilatation, hypertension, diabetes, and cough variant asthma.  She presents after 2 brief syncopal episodes that occurred during church.  She was standing during the first 1, and she was sitting during the second.  She does not remember how long they were, but a church member states that they were approximately 2 minutes.  She was not confused when she awoke.  No intraoral trauma.  No loss of bladder control.  She was nauseated and had some vomiting.  Therefore, she is now complaining of a sore throat and hoarseness.  She still feels nauseated.  She had a coronary artery CT in 2020 which had a calcium score of 0.  The history is provided by the patient.  Loss of Consciousness Episode history:  Multiple Most recent episode:  Today Duration: unknown. Timing:  Intermittent (2 episodes) Progression:  Resolved Chronicity:  New Context: sitting down and standing up   Witnessed: yes   Relieved by:  Nothing Worsened by:  Nothing Ineffective treatments:  None tried Associated symptoms: nausea and vomiting   Associated symptoms: no chest pain, no diaphoresis, no difficulty breathing, no fever, no focal sensory loss, no focal weakness, no headaches, no palpitations, no recent fall, no seizures, no shortness of breath and no visual change       Past Medical History:  Diagnosis Date   Ascending aorta dilatation (HCC)    Asthma    Chronic diastolic CHF (congestive heart failure) (HCC)    Chronic rhinitis    Cough    Edema, lower extremity    GERD (gastroesophageal reflux disease)    HTN (hypertension)    Mild aortic insufficiency    Morbid  obesity (HCC)    Murmur    Pneumonia    Pre-diabetes     Patient Active Problem List   Diagnosis Date Noted   Type 2 diabetes mellitus without complication, without long-term current use of insulin (HCC) 02/01/2020   Snoring 09/29/2019   Diastolic heart failure (HCC) 08/31/2018   Chronic rhinitis 07/16/2018   Gastroesophageal reflux disease 04/10/2018   Cough variant asthma vs uacs  04/02/2018   Left tibial fracture 06/07/2016   Post-tonsillectomy hemorrhage 04/26/2016   Morbid obesity due to excess calories (HCC) 01/20/2012   Murmur 01/20/2012   Essential hypertension     Past Surgical History:  Procedure Laterality Date   ANKLE RECONSTRUCTION     TONSILLECTOMY     TOTAL VAGINAL HYSTERECTOMY  2007     OB History     Gravida  2   Para  2   Term      Preterm      AB      Living         SAB      IAB      Ectopic      Multiple      Live Births              Family History  Problem Relation Age of Onset   Hypertension Mother    Diabetes Mother    Heart disease Mother    Sleep apnea Mother    Obesity Mother  Hypertension Father    Anemia Father     Social History   Tobacco Use   Smoking status: Former    Packs/day: 0.25    Years: 10.00    Pack years: 2.50    Types: Cigarettes    Quit date: 01/26/2018    Years since quitting: 3.4   Smokeless tobacco: Former  Building services engineer Use: Never used  Substance Use Topics   Alcohol use: No   Drug use: No    Home Medications Prior to Admission medications   Medication Sig Start Date End Date Taking? Authorizing Provider  albuterol (VENTOLIN HFA) 108 (90 Base) MCG/ACT inhaler TAKE 2 PUFFS BY MOUTH EVERY 4 TO 6 HOURS AS NEEDED 03/08/21   Marcelyn Bruins, MD  Azelastine-Fluticasone 137-50 MCG/ACT SUSP PLACE 1 SPRAY INTO THE NOSE 2 (TWO) TIMES DAILY. 04/10/21   Marcelyn Bruins, MD  EPINEPHrine 0.3 mg/0.3 mL IJ SOAJ injection SMARTSIG:0.3 Milligram(s) IM Once PRN 12/07/20    Marcelyn Bruins, MD  famotidine (PEPCID) 20 MG tablet TAKE 1 TABLET BY MOUTH EVERYDAY AT BEDTIME 03/08/21   Marcelyn Bruins, MD  hydrALAZINE (APRESOLINE) 50 MG tablet Take 1 tablet (50 mg total) by mouth 3 (three) times daily. 10/16/20 07/16/21  Dyann Kief, PA-C  levocetirizine (XYZAL) 5 MG tablet TAKE 1 TABLET BY MOUTH EVERY DAY IN THE EVENING 04/12/21   Marcelyn Bruins, MD  montelukast (SINGULAIR) 10 MG tablet TAKE 1 TABLET BY MOUTH EVERYDAY AT BEDTIME 07/09/21   Marcelyn Bruins, MD  nystatin (NYSTATIN) powder Apply 1 application topically 3 (three) times daily. 06/12/20   Dorothyann Peng, MD  nystatin cream (MYCOSTATIN) Apply 1 application topically 2 (two) times daily. 06/12/20   Dorothyann Peng, MD  olmesartan (BENICAR) 40 MG tablet TAKE 1 TABLET BY MOUTH EVERY DAY 10/25/19   Dorothyann Peng, MD  pantoprazole (PROTONIX) 40 MG tablet Take 1 tablet by mouth daily 03/08/21   Marcelyn Bruins, MD  Semaglutide-Weight Management (WEGOVY) 1.7 MG/0.75ML SOAJ Inject 1.7 mg into the skin once a week. Patient not taking: No sig reported 06/12/20   Dorothyann Peng, MD  spironolactone (ALDACTONE) 50 MG tablet Take 1 tablet (50 mg total) by mouth daily. 10/06/20   Dyann Kief, PA-C  SYMBICORT 160-4.5 MCG/ACT inhaler TAKE 2 PUFFS BY MOUTH TWICE A DAY 12/20/20   Marcelyn Bruins, MD    Allergies    Honey bee treatment [bee venom] and Wheat bran  Review of Systems   Review of Systems  Constitutional:  Negative for chills, diaphoresis and fever.  HENT:  Negative for ear pain and sore throat.   Eyes:  Negative for pain and visual disturbance.  Respiratory:  Negative for cough and shortness of breath.   Cardiovascular:  Positive for syncope. Negative for chest pain and palpitations.  Gastrointestinal:  Positive for nausea and vomiting. Negative for abdominal pain.  Genitourinary:  Negative for dysuria and hematuria.  Musculoskeletal:  Negative for  arthralgias and back pain.  Skin:  Negative for color change and rash.  Neurological:  Negative for focal weakness, seizures, syncope and headaches.  All other systems reviewed and are negative.  Physical Exam Updated Vital Signs BP (!) 161/102   Pulse 90   Temp 98.8 F (37.1 C) (Oral)   Resp 16   LMP 12/19/2005   SpO2 95%   Physical Exam Vitals and nursing note reviewed.  Constitutional:      Appearance: She is well-developed.  HENT:  Head: Normocephalic and atraumatic.     Nose: Nose normal.     Mouth/Throat:     Mouth: Mucous membranes are moist.     Pharynx: No oropharyngeal exudate or posterior oropharyngeal erythema.  Eyes:     Conjunctiva/sclera: Conjunctivae normal.  Cardiovascular:     Rate and Rhythm: Normal rate and regular rhythm.     Heart sounds: Normal heart sounds.  Pulmonary:     Effort: Pulmonary effort is normal. No tachypnea.     Breath sounds: Normal breath sounds.  Abdominal:     Palpations: Abdomen is soft.     Tenderness: There is no abdominal tenderness.  Musculoskeletal:     Right lower leg: No edema.     Left lower leg: No edema.  Skin:    General: Skin is warm and dry.  Neurological:     General: No focal deficit present.     Mental Status: She is alert and oriented to person, place, and time.  Psychiatric:        Mood and Affect: Mood normal.        Behavior: Behavior normal.    ED Results / Procedures / Treatments   Labs (all labs ordered are listed, but only abnormal results are displayed) Labs Reviewed  CBC - Abnormal; Notable for the following components:      Result Value   MCH 25.7 (*)    All other components within normal limits  COMPREHENSIVE METABOLIC PANEL - Abnormal; Notable for the following components:   Potassium 3.3 (*)    Glucose, Bld 104 (*)    All other components within normal limits  CBG MONITORING, ED - Abnormal; Notable for the following components:   Glucose-Capillary 101 (*)    All other components  within normal limits  TROPONIN I (HIGH SENSITIVITY) - Abnormal; Notable for the following components:   Troponin I (High Sensitivity) 18 (*)    All other components within normal limits  URINALYSIS, ROUTINE W REFLEX MICROSCOPIC  I-STAT BETA HCG BLOOD, ED ([facility], [facility], [facility] ONLY)  TROPONIN I (HIGH SENSITIVITY)    EKG EKG Interpretation  Date/Time:  Sunday July 22 2021 13:19:04 EDT Ventricular Rate:  75 PR Interval:  161 QRS Duration: 103 QT Interval:  427 QTC Calculation: 477 R Axis:   -12 Text Interpretation: Sinus rhythm Abnormal R-wave progression, late transition Left ventricular hypertrophy no acute ischemia Confirmed by Pieter Partridge ([id number]) on 07/22/2021 1:51:42 PM  Radiology DG Chest Port 1 View  Result Date: 07/22/2021 CLINICAL DATA:  CHF, syncope EXAM: PORTABLE CHEST 1 VIEW COMPARISON:  08/31/2018 FINDINGS: Stable cardiomegaly. Mild pulmonary vascular congestion with mildly increased bibasilar interstitial markings. No pleural effusion or pneumothorax. IMPRESSION: Findings suggestive of CHF with mild interstitial edema. Electronically Signed   By: Duanne Guess D.O.   On: 07/22/2021 14:26    Procedures Procedures   Medications Ordered in ED Medications  ondansetron (ZOFRAN-ODT) disintegrating tablet 8 mg (has no administration in time range)  alum & mag hydroxide-simeth (MAALOX/MYLANTA) 200-200-20 MG/5ML suspension 30 mL (has no administration in time range)    And  lidocaine (XYLOCAINE) 2 % viscous mouth solution 15 mL (has no administration in time range)    ED Course  I have reviewed the triage vital signs and the nursing notes.  Pertinent labs & imaging results that were available during my care of the patient were reviewed by me and considered in my medical decision making (see chart for details).    MDM Rules/Calculators/A&P  Jessica Aland Aure presented after 2 syncopal episodes that occurred while in church.  She was nauseated when  she presented to the ED but by discharge, she was able to tolerate oral fluids.  The most significant abnormality was her blood pressure which remained persistently elevated.  She states that she has been taking her medication, and during her recent cardiology visit, her blood pressure was well controlled.  She will contact cardiology regarding this visit as well as her hypertension, and she will await further evaluation.  She does have some mild dilatation of the root of the thoracic aorta.  She actually has an MRI scheduled this week to follow this.  Current presentation is not consistent with aortic dissection.  ED work-up showed very minimal pulmonary edema consistent with mild CHF.  Troponin went from 12-18, but this is likely secondary to her CHF or hypertension.  I gave her a dose of Lasix here.  She will be discharged home.  Again, I stressed the importance of follow-up with cardiology. Final Clinical Impression(s) / ED Diagnoses Final diagnoses:  Syncope and collapse  Essential hypertension    Rx / DC Orders ED Discharge Orders     None        Koleen Distance, MD 07/22/21 1658    Koleen Distance, MD 07/22/21 1659

## 2021-07-22 NOTE — ED Triage Notes (Signed)
Patient here from church reporting syncopal episode after getting dizzy and hot - member reporting 2 min. Hypertensive. Hx of diabetes. CBG 110.

## 2021-07-23 ENCOUNTER — Encounter: Payer: Self-pay | Admitting: Internal Medicine

## 2021-07-23 ENCOUNTER — Ambulatory Visit: Payer: BC Managed Care – PPO | Admitting: Internal Medicine

## 2021-07-23 ENCOUNTER — Other Ambulatory Visit: Payer: Self-pay

## 2021-07-23 VITALS — BP 128/70 | HR 77 | Temp 97.9°F | Ht 62.8 in | Wt 271.4 lb

## 2021-07-23 DIAGNOSIS — Z23 Encounter for immunization: Secondary | ICD-10-CM

## 2021-07-23 DIAGNOSIS — E1169 Type 2 diabetes mellitus with other specified complication: Secondary | ICD-10-CM | POA: Diagnosis not present

## 2021-07-23 DIAGNOSIS — I110 Hypertensive heart disease with heart failure: Secondary | ICD-10-CM | POA: Diagnosis not present

## 2021-07-23 DIAGNOSIS — E66813 Obesity, class 3: Secondary | ICD-10-CM

## 2021-07-23 DIAGNOSIS — R55 Syncope and collapse: Secondary | ICD-10-CM

## 2021-07-23 DIAGNOSIS — I5032 Chronic diastolic (congestive) heart failure: Secondary | ICD-10-CM | POA: Diagnosis not present

## 2021-07-23 DIAGNOSIS — R7309 Other abnormal glucose: Secondary | ICD-10-CM

## 2021-07-23 DIAGNOSIS — Z6841 Body Mass Index (BMI) 40.0 and over, adult: Secondary | ICD-10-CM

## 2021-07-23 DIAGNOSIS — B353 Tinea pedis: Secondary | ICD-10-CM

## 2021-07-23 DIAGNOSIS — Z9119 Patient's noncompliance with other medical treatment and regimen: Secondary | ICD-10-CM

## 2021-07-23 DIAGNOSIS — E119 Type 2 diabetes mellitus without complications: Secondary | ICD-10-CM

## 2021-07-23 MED ORDER — ZOSTER VAC RECOMB ADJUVANTED 50 MCG/0.5ML IM SUSR: 0.5000 mL | Freq: Once | INTRAMUSCULAR | 0 refills | Status: AC

## 2021-07-23 NOTE — Progress Notes (Signed)
Jessica Snow,acting as a Neurosurgeon for Jessica Aliment, MD.,have documented all relevant documentation on the behalf of Jessica Aliment, MD,as directed by  Jessica Aliment, MD while in the presence of Jessica Aliment, MD.  This visit occurred during the SARS-CoV-2 public health emergency.  Safety protocols were in place, including screening questions prior to the visit, additional usage of staff PPE, and extensive cleaning of exam room while observing appropriate contact time as indicated for disinfecting solutions.  Subjective:     Patient ID: Jessica Snow , female    DOB: [birth year] , 55 y.o.   MRN: [id number]   Chief Complaint  Patient presents with   Diabetes   Hypertension    HPI  She presents today for BP check. She reports compliance with meds. Of note, she went to [facility] yesterday (9/25) for further evaluation of syncopal episode while at church. She presents after 2 brief syncopal episodes that occurred.  There was no associated chest pain and/or palpitations. She was standing during the first episode and she was sitting during the second.  She does not remember how long they were, but a church member states that they were approximately 2 minutes.  She was not confused when she awakened.  No intraoral trauma.  No loss of bladder control.  She was nauseated and had some vomiting.     ED work-up showed very minimal pulmonary edema consistent with mild CHF.  Troponin went from 12-18, but this is likely secondary to her CHF or hypertension.  Her nausea was relieved w/ Zofran and she was discharged in stable condition. She has not had any further episodes since her discharge.   Hypertension This is a chronic problem. The current episode started more than 1 year ago. The problem has been gradually improving since onset. The problem is uncontrolled. Pertinent negatives include no blurred vision, chest pain, palpitations or shortness of breath. Past treatments include  angiotensin blockers and diuretics. The current treatment provides mild improvement. Compliance problems include exercise.     Past Medical History:  Diagnosis Date   Ascending aorta dilatation (HCC)    Asthma    Chronic diastolic CHF (congestive heart failure) (HCC)    Chronic rhinitis    Cough    Dilation of pulmonary artery (HCC)    Edema, lower extremity    GERD (gastroesophageal reflux disease)    HTN (hypertension)    Mild aortic insufficiency    Morbid obesity (HCC)    Murmur    Pneumonia    Pre-diabetes      Family History  Problem Relation Age of Onset   Hypertension Mother    Diabetes Mother    Heart disease Mother    Sleep apnea Mother    Obesity Mother    Hypertension Father    Anemia Father      Current Outpatient Medications:    albuterol (VENTOLIN HFA) 108 (90 Base) MCG/ACT inhaler, TAKE 2 PUFFS BY MOUTH EVERY 4 TO 6 HOURS AS NEEDED, Disp: 18 g, Rfl: 3   Azelastine-Fluticasone 137-50 MCG/ACT SUSP, PLACE 1 SPRAY INTO THE NOSE 2 (TWO) TIMES DAILY., Disp: 23 g, Rfl: 5   EPINEPHrine 0.3 mg/0.3 mL IJ SOAJ injection, SMARTSIG:0.3 Milligram(s) IM Once PRN, Disp: 1 each, Rfl: 2   famotidine (PEPCID) 20 MG tablet, TAKE 1 TABLET BY MOUTH EVERYDAY AT BEDTIME, Disp: 90 tablet, Rfl: 0   hydrALAZINE (APRESOLINE) 50 MG tablet, Take 1 tablet (50 mg total) by mouth 3 (three)  times daily., Disp: 270 tablet, Rfl: 3   levocetirizine (XYZAL) 5 MG tablet, TAKE 1 TABLET BY MOUTH EVERY DAY IN THE EVENING, Disp: 90 tablet, Rfl: 0   montelukast (SINGULAIR) 10 MG tablet, TAKE 1 TABLET BY MOUTH EVERYDAY AT BEDTIME, Disp: 90 tablet, Rfl: 0   nystatin powder, Apply 1 application topically 2 (two) times daily as needed., Disp: 30 g, Rfl: 1   olmesartan (BENICAR) 40 MG tablet, TAKE 1 TABLET BY MOUTH EVERY DAY, Disp: 90 tablet, Rfl: 1   pantoprazole (PROTONIX) 40 MG tablet, Take 1 tablet by mouth daily, Disp: 180 tablet, Rfl: 0   spironolactone (ALDACTONE) 50 MG tablet, Take 1 tablet (50 mg  total) by mouth daily., Disp: 90 tablet, Rfl: 3   nystatin (NYSTATIN) powder, Apply 1 application topically 3 (three) times daily., Disp: 15 g, Rfl: 0   nystatin cream (MYCOSTATIN), Apply 1 application topically 2 (two) times daily., Disp: 30 g, Rfl: 2   OZEMPIC, 1 MG/DOSE, 4 MG/3ML SOPN, INJECT 1MG  INTO THE SKIN ONCE A WEEK, Disp: 3 mL, Rfl: 3   SYMBICORT 160-4.5 MCG/ACT inhaler, TAKE 2 PUFFS BY MOUTH TWICE A DAY, Disp: 30.6 each, Rfl: 1   Allergies  Allergen Reactions   Honey Bee Treatment [Bee Venom]    Wheat Bran      Review of Systems  Constitutional: Negative.   Eyes:  Negative for blurred vision.  Respiratory: Negative.  Negative for shortness of breath.   Cardiovascular: Negative.  Negative for chest pain and palpitations.  Gastrointestinal: Negative.   Neurological: Negative.   Psychiatric/Behavioral: Negative.      Today's Vitals   07/23/21 1541  BP: 128/70  Pulse: 77  Temp: 97.9 F (36.6 C)  Weight: 271 lb 6.4 oz (123.1 kg)  Height: 5' 2.8" (1.595 m)  PainSc: 0-No pain   Body mass index is 48.38 kg/m.   Objective:  Physical Exam Vitals and nursing note reviewed.  Constitutional:      Appearance: Normal appearance. She is obese.  HENT:     Head: Normocephalic and atraumatic.     Nose:     Comments: Masked     Mouth/Throat:     Comments: Masked  Eyes:     Extraocular Movements: Extraocular movements intact.  Cardiovascular:     Rate and Rhythm: Normal rate and regular rhythm.     Pulses:          Dorsalis pedis pulses are 2+ on the right side and 2+ on the left side.     Heart sounds: Normal heart sounds.  Pulmonary:     Effort: Pulmonary effort is normal.     Breath sounds: Normal breath sounds.  Musculoskeletal:     Cervical back: Normal range of motion.  Feet:     Right foot:     Protective Sensation: 5 sites tested.  5 sites sensed.     Skin integrity: Dry skin present.     Toenail Condition: Right toenails are normal.     Left foot:      Protective Sensation: 5 sites tested.  5 sites sensed.     Skin integrity: Dry skin present.     Toenail Condition: Left toenails are normal.     Comments: Scaly skin b/l feet Skin:    General: Skin is warm.  Neurological:     General: No focal deficit present.     Mental Status: She is alert.  Psychiatric:        Mood and Affect: Mood  normal.        Behavior: Behavior normal.        Assessment And Plan:     1. Type 2 diabetes mellitus without complication, without long-term current use of insulin (HCC) Comments: Chronic, I will check labs as listed below. I think she should resume GLP-1 agonist therapy. Would likely benefit from SGLT2-inh therapy as well.  I will make further recommendations once her labs are available. She denies family history of thyroid cancer. Importance of medication, dietary and exercise compliance was stressed to the patient. She will f/u in 6 weeks if Ozempic sample is avail today.  - Hemoglobin A1c  2. Hypertensive heart disease with chronic diastolic congestive heart failure (HCC) Comments: Chronic, well controlled. NO med changes today. Encouraged to follow low sodium diet. Will consider SGLT2-inh therapy.  - Lipid panel  3. Syncope, unspecified syncope type Comments: ER visit reviewed. Admits to non-compliance with CPAP. Importance of CPAP compliance for OSA mgmt was d/w patient. Risks of untreated OSA explained to pt. She is also encouraged to keep f/u appt with Cardiology.   4. Tinea pedis of both feet Comments: I will send rx nystatin powder to apply to feet twice daily.   5. Class 3 severe obesity due to excess calories with serious comorbidity and body mass index (BMI) of 45.0 to 49.9 in adult [facility]) Comments: BMI 49, encouraged to initially strive for BMI less than   6. Immunization due Comments: She was given flu vaccine.  - Flu Vaccine QUAD 6+ mos PF IM (Fluarix Quad PF)   Patient was given opportunity to ask questions. Patient verbalized  understanding of the plan and was able to repeat key elements of the plan. All questions were answered to their satisfaction.   I, Jessica Aliment, MD, have reviewed all documentation for this visit. The documentation on 07/23/21 for the exam, diagnosis, procedures, and orders are all accurate and complete.   IF YOU HAVE BEEN REFERRED TO A SPECIALIST, IT MAY TAKE 1-2 WEEKS TO SCHEDULE/PROCESS THE REFERRAL. IF YOU HAVE NOT HEARD FROM US/SPECIALIST IN TWO WEEKS, PLEASE GIVE [place] A CALL AT [phone].   THE PATIENT IS ENCOURAGED TO PRACTICE SOCIAL DISTANCING DUE TO THE COVID-19 PANDEMIC.

## 2021-07-23 NOTE — Patient Instructions (Signed)
Athlete's Foot °Athlete's foot (tinea pedis) is a fungal infection of the skin on your feet. It often occurs on the skin that is between or underneath your toes. It can also occur on the soles of your feet. Symptoms include itchy or white and flaky areas on the skin. The infection can spread from person to person (is contagious). It can also spread when a person's bare feet come in contact with the fungus on shower floors or on items such as shoes. °Follow these instructions at home: °Medicines °Apply or take over-the-counter and prescription medicines only as told by your doctor. °Apply your antifungal medicine as told by your doctor. Do not stop using it even if your feet start to get better. °Foot care °Do not scratch your feet. °Keep your feet dry: °Wear cotton or wool socks. Change your socks every day or if they become wet. °Wear shoes that allow air to move around, such as sandals or canvas tennis shoes. °Wash and dry your feet: °Every day or as told by your doctor. °After exercising. °Including the area between your toes. °General instructions °Do not share any of these items that touch your feet: °Towels. °Shoes. °Nail clippers. °Other personal items. °Protect your feet by wearing sandals in wet areas, such as locker rooms and shared showers. °Keep all follow-up visits. °If you have diabetes, keep your blood sugar under control. °Contact a doctor if: °You have a fever. °You have swelling, pain, warmth, or redness in your foot. °Your feet are not getting better with treatment. °Your symptoms get worse. °You have new symptoms. °You have very bad pain. °Summary °Athlete's foot is a fungal infection of the skin on your feet. °This condition is caused by a fungus that grows in warm, moist places. °Symptoms include itchy or white and flaky areas on the skin. °Apply your antifungal medicine as told by your doctor. °Keep your feet clean and dry. °This information is not intended to replace advice given to you by  your health care provider. Make sure you discuss any questions you have with your health care provider. °Document Revised: 02/04/2021 Document Reviewed: 02/04/2021 °Elsevier Patient Education © 2022 Elsevier Inc. ° °

## 2021-07-24 LAB — LIPID PANEL
Chol/HDL Ratio: 3.5 ratio (ref 0.0–4.4)
Cholesterol, Total: 166 mg/dL (ref 100–199)
HDL: 47 mg/dL (ref >39)
LDL Chol Calc (NIH): 96 mg/dL (ref 0–99)
Triglycerides: 129 mg/dL (ref 0–149)
VLDL Cholesterol Cal: 23 mg/dL (ref 5–40)

## 2021-07-24 LAB — HEMOGLOBIN A1C
Est. average glucose Bld gHb Est-mCnc: 151 mg/dL
Hgb A1c MFr Bld: 6.9 % — ABNORMAL HIGH (ref 4.8–5.6)

## 2021-07-31 ENCOUNTER — Ambulatory Visit (HOSPITAL_BASED_OUTPATIENT_CLINIC_OR_DEPARTMENT_OTHER): Payer: BC Managed Care – PPO | Admitting: Family

## 2021-07-31 ENCOUNTER — Encounter (HOSPITAL_BASED_OUTPATIENT_CLINIC_OR_DEPARTMENT_OTHER): Payer: BC Managed Care – PPO

## 2021-07-31 ENCOUNTER — Other Ambulatory Visit: Payer: Self-pay

## 2021-07-31 ENCOUNTER — Ambulatory Visit (INDEPENDENT_AMBULATORY_CARE_PROVIDER_SITE_OTHER): Payer: BC Managed Care – PPO

## 2021-07-31 ENCOUNTER — Ambulatory Visit: Payer: BC Managed Care – PPO

## 2021-07-31 ENCOUNTER — Encounter (HOSPITAL_BASED_OUTPATIENT_CLINIC_OR_DEPARTMENT_OTHER): Payer: Self-pay | Admitting: Family

## 2021-07-31 VITALS — BP 156/82 | HR 69 | Ht 63.0 in | Wt 271.1 lb

## 2021-07-31 VITALS — BP 140/80 | HR 78 | Temp 98.3°F | Ht 63.0 in | Wt 273.0 lb

## 2021-07-31 DIAGNOSIS — I77810 Thoracic aortic ectasia: Secondary | ICD-10-CM

## 2021-07-31 DIAGNOSIS — E119 Type 2 diabetes mellitus without complications: Secondary | ICD-10-CM

## 2021-07-31 DIAGNOSIS — R55 Syncope and collapse: Secondary | ICD-10-CM | POA: Diagnosis not present

## 2021-07-31 DIAGNOSIS — Z9989 Dependence on other enabling machines and devices: Secondary | ICD-10-CM

## 2021-07-31 DIAGNOSIS — G4733 Obstructive sleep apnea (adult) (pediatric): Secondary | ICD-10-CM | POA: Diagnosis not present

## 2021-07-31 DIAGNOSIS — I10 Essential (primary) hypertension: Secondary | ICD-10-CM | POA: Diagnosis not present

## 2021-07-31 DIAGNOSIS — E876 Hypokalemia: Secondary | ICD-10-CM

## 2021-07-31 MED ORDER — OZEMPIC (0.25 OR 0.5 MG/DOSE) 2 MG/1.5ML ~~LOC~~ SOPN: 0.5000 mg | PEN_INJECTOR | SUBCUTANEOUS | 3 refills | Status: DC

## 2021-07-31 MED ORDER — POTASSIUM CHLORIDE CRYS ER 10 MEQ PO TBCR: EXTENDED_RELEASE_TABLET | ORAL | 0 refills | Status: DC

## 2021-07-31 NOTE — Progress Notes (Signed)
Patient is here for teaching of ozempic. Pt self administered today. She will return in 8 weeks.

## 2021-07-31 NOTE — Patient Instructions (Addendum)
Medication Instructions:  Your physician has recommended you make the following change in your medication:    START potassium daily for 3 days  *If you need a refill on your cardiac medications before your next appointment, please call your pharmacy*   Lab Work: None ordered today.   Testing/Procedures: Your physician has recommended that you wear a Zio monitor.   This monitor is a medical device that records the heart's electrical activity. Doctors most often use these monitors to diagnose arrhythmias. Arrhythmias are problems with the speed or rhythm of the heartbeat. The monitor is a small device applied to your chest. You can wear one while you do your normal daily activities. While wearing this monitor if you have any symptoms to push the button and record what you felt. Once you have worn this monitor for the period of time provider prescribed (Usually 14 days), you will return the monitor device in the postage paid box. Once it is returned they will download the data collected and provide [place] with a report which the provider will then review and we will call you with those results. Important tips:  Avoid showering during the first 24 hours of wearing the monitor. Avoid excessive sweating to help maximize wear time. Do not submerge the device, no hot tubs, and no swimming pools. Keep any lotions or oils away from the patch. After 24 hours you may shower with the patch on. Take brief showers with your back facing the shower head.  Do not remove patch once it has been placed because that will interrupt data and decrease adhesive wear time. Push the button when you have any symptoms and write down what you were feeling. Once you have completed wearing your monitor, remove and place into box which has postage paid and place in your outgoing mailbox.  If for some reason you have misplaced your box then call our office and we can provide another box and/or mail it off for  you.  Follow-Up: At [facility], you and your health needs are our priority.  As part of our continuing mission to provide you with exceptional heart care, we have created designated Provider Care Teams.  These Care Teams include your primary Cardiologist (physician) and Advanced Practice Providers (APPs -  Physician Assistants and Nurse Practitioners) who all work together to provide you with the care you need, when you need it.  We recommend signing up for the patient portal called "MyChart".  Sign up information is provided on this After Visit Summary.  MyChart is used to connect with patients for Virtual Visits (Telemedicine).  Patients are able to view lab/test results, encounter notes, upcoming appointments, etc.  Non-urgent messages can be sent to your provider as well.   To learn more about what you can do with MyChart, go to [email or web address].    Your next appointment:   In February as scheduled with Dr. Shari Prows   Other Instructions  Heart Healthy Diet Recommendations: A low-salt diet is recommended. Meats should be grilled, baked, or boiled. Avoid fried foods. Focus on lean protein sources like fish or chicken with vegetables and fruits. The American Heart Association is a Chief Technology Officer!    Exercise recommendations: The American Heart Association recommends 150 minutes of moderate intensity exercise weekly. Try 30 minutes of moderate intensity exercise 4-5 times per week. This could include walking, jogging, or swimming.   Tips to Measure your Blood Pressure Correctly  Our goal is for your blood pressure to be less than  130/80  To determine whether you have hypertension, a medical professional will take a blood pressure reading. How you prepare for the test, the position of your arm, and other factors can change a blood pressure reading by 10% or more. That could be enough to hide high blood pressure, start you on a drug you don't really need, or lead your doctor to  incorrectly adjust your medications.  National and international guidelines offer specific instructions for measuring blood pressure. If a doctor, nurse, or medical assistant isn't doing it right, don't hesitate to ask him or her to get with the guidelines.  Here's what you can do to ensure a correct reading:  Don't drink a caffeinated beverage or smoke during the 30 minutes before the test.  Sit quietly for five minutes before the test begins.  During the measurement, sit in a chair with your feet on the floor and your arm supported so your elbow is at about heart level.  The inflatable part of the cuff should completely cover at least 80% of your upper arm, and the cuff should be placed on bare skin, not over a shirt.  Don't talk during the measurement.  Have your blood pressure measured twice, with a brief break in between. If the readings are different by 5 points or more, have it done a third time.  In 2017, new guidelines from the American Heart Association, the [employer] of Cardiology, and nine other health organizations lowered the diagnosis of high blood pressure to 130/80 mm Hg or higher for all adults. The guidelines also redefined the various blood pressure categories to now include normal, elevated, Stage 1 hypertension, Stage 2 hypertension, and hypertensive crisis (see "Blood pressure categories").  Blood pressure categories  Blood pressure category SYSTOLIC (upper number)  DIASTOLIC (lower number)  Normal Less than 120 mm Hg and Less than 80 mm Hg  Elevated 120-129 mm Hg and Less than 80 mm Hg  High blood pressure: Stage 1 hypertension 130-139 mm Hg or 80-89 mm Hg  High blood pressure: Stage 2 hypertension 140 mm Hg or higher or 90 mm Hg or higher  Hypertensive crisis (consult your doctor immediately) Higher than 180 mm Hg and/or Higher than 120 mm Hg  Source: American Heart Association and American Stroke Association. For more on getting your blood pressure under  control, buy Controlling Your Blood Pressure, a Special Health Report from [facility].

## 2021-07-31 NOTE — Progress Notes (Signed)
Office Visit    Patient Name: Jessica Snow Date of Encounter: 07/31/2021  PCP:  Dorothyann Peng, MD   [facility]  Cardiologist:  Meriam Sprague, MD   Advanced Practice Provider:  No care team member to display Electrophysiologist:  None   Chief Complaint    Jessica Snow is a 55 y.o. female with a hx of chronic diastolic heart failure, hypertension, obesity, dilated ascending thoracic, syncope, OSA presents today for syncope.   Past Medical History    Past Medical History:  Diagnosis Date   Ascending aorta dilatation (HCC)    Asthma    Chronic diastolic CHF (congestive heart failure) (HCC)    Chronic rhinitis    Cough    Edema, lower extremity    GERD (gastroesophageal reflux disease)    HTN (hypertension)    Mild aortic insufficiency    Morbid obesity (HCC)    Murmur    Pneumonia    Pre-diabetes    Past Surgical History:  Procedure Laterality Date   ANKLE RECONSTRUCTION     TONSILLECTOMY     TOTAL VAGINAL HYSTERECTOMY  2007    Allergies  Allergies  Allergen Reactions   Honey Bee Treatment [Bee Venom]    Wheat Bran     History of Present Illness    Jessica Snow is a 55 y.o. female with a hx of chronic diastolic heart failure, hypertension, obesity, dilated ascending thoracic aorta, syncope, OSA last seen 07/16/21 by Dr. Mayford Knife.  Previous CTA 2019 with coronary calcium score of 0, mild AI, ascending aortic dilation 54mm by CTA 2020.  She was seen in the ED 07/22/21 after syncopal episode in church. Troponin went from 12 to 18 but was presumed due to hypertension or CHF. She was provided dose of Lasix.   She presents today for follow up with her mother. Tells me she was in praise and worship at church when she had syncopal episode. Remembers getting "happy" then hit the floor. Family and friends at church thought she lost consciousness for approximately 2 minutes. No loss of bladder function and did not bite her tongue. That  morning she had coffee but no water nor breakfast. Reports no chest pain, pressure, tightness. Tells me her dyspnea on exertion is stable at baseline. She is planning to start Ozempic for weight loss benefit by primary care. Reports rare mid sternal chest discomfort at rest with resolves with rubbing and lasts only a few minutes. Notes occasional spasm in her left hand and thumb, discussed that potassium was low in the ED and this could be contributory.   EKGs/Labs/Other Studies Reviewed:   The following studies were reviewed today:  EKG:  No EKG today.   Recent Labs: 07/22/2021: ALT 35; BUN 10; Creatinine, Ser 0.72; Hemoglobin 12.5; Platelets 226; Potassium 3.3; Sodium 139  Recent Lipid Panel    Component Value Date/Time   CHOL 166 07/23/2021 1659   TRIG 129 07/23/2021 1659   HDL 47 07/23/2021 1659   CHOLHDL 3.5 07/23/2021 1659   LDLCALC 96 07/23/2021 1659    Home Medications   Current Meds  Medication Sig   albuterol (VENTOLIN HFA) 108 (90 Base) MCG/ACT inhaler TAKE 2 PUFFS BY MOUTH EVERY 4 TO 6 HOURS AS NEEDED   Azelastine-Fluticasone 137-50 MCG/ACT SUSP PLACE 1 SPRAY INTO THE NOSE 2 (TWO) TIMES DAILY.   EPINEPHrine 0.3 mg/0.3 mL IJ SOAJ injection SMARTSIG:0.3 Milligram(s) IM Once PRN   famotidine (PEPCID) 20 MG tablet TAKE 1  TABLET BY MOUTH EVERYDAY AT BEDTIME   levocetirizine (XYZAL) 5 MG tablet TAKE 1 TABLET BY MOUTH EVERY DAY IN THE EVENING   montelukast (SINGULAIR) 10 MG tablet TAKE 1 TABLET BY MOUTH EVERYDAY AT BEDTIME   nystatin (NYSTATIN) powder Apply 1 application topically 3 (three) times daily.   nystatin cream (MYCOSTATIN) Apply 1 application topically 2 (two) times daily.   olmesartan (BENICAR) 40 MG tablet TAKE 1 TABLET BY MOUTH EVERY DAY   pantoprazole (PROTONIX) 40 MG tablet Take 1 tablet by mouth daily   potassium chloride SA (KLOR-CON) 10 MEQ tablet Take one tablet daily for three days.   spironolactone (ALDACTONE) 50 MG tablet Take 1 tablet (50 mg total) by  mouth daily.   SYMBICORT 160-4.5 MCG/ACT inhaler TAKE 2 PUFFS BY MOUTH TWICE A DAY     Review of Systems   All other systems reviewed and are otherwise negative except as noted above.  Physical Exam    VS:  BP (!) 156/82   Pulse 69   Ht 5\' 3"  (1.6 m)   Wt 271 lb 1.6 oz (123 kg)   LMP 12/19/2005   SpO2 98%   BMI 48.02 kg/m  , BMI Body mass index is 48.02 kg/m.  Wt Readings from Last 3 Encounters:  07/31/21 271 lb 1.6 oz (123 kg)  07/23/21 271 lb 6.4 oz (123.1 kg)  07/16/21 277 lb (125.6 kg)    GEN: Well nourished, overweight, well developed, in no acute distress. HEENT: normal. Neck: Supple, no JVD, carotid bruits, or masses. Cardiac: RRR, no murmurs, rubs, or gallops. No clubbing, cyanosis, edema.  Radials/PT 2+ and equal bilaterally.  Respiratory:  Respirations regular and unlabored, clear to auscultation bilaterally. GI: Soft, nontender, nondistended. MS: No deformity or atrophy. Skin: Warm and dry, no rash. Neuro:  Strength and sensation are intact. Psych: Normal affect.  Assessment & Plan    Syncope - Echo 11/03/20 normal LVEF, no significant valvular abnormalities, mildly dilated ascending aorta 46mm. Episode syncope Sunday at church in setting of not eating brakfast with prodromal symptom of "feeling happy". Suspect orthostasis/vasovagal. 14 day ZIO placed to evaluate for arrhythmia. Discussed orthostatic precautions.   HTN - BP mildly elevated but well controlled at recent clinic visits. She will monitor at home and report BP consistently >130/80. If noted, plan to increase Hydralazine dose to 100mg  TID.   OSA - CPAP compliance encouraged. Follows with Dr. Monday. Reiterated importance of using regularly as has not been using regularly. Discussed relationshipw ith hypertension.   GERD - Mangement per PCP.   Hypokalemia - Noted in ED visit but not treated. Rx Potassium daily x 3 days.   Thoracic aortic aneurysm - Upcoming MRI 08/10/21 for monitoring. Continue  optimal BP control.  Disposition: Follow up  February 2023 as scheduled  with Dr. 08/12/21  Signed, March 2023, NP 07/31/2021, 9:53 AM [facility]

## 2021-08-06 ENCOUNTER — Telehealth: Payer: Self-pay | Admitting: *Deleted

## 2021-08-06 NOTE — Telephone Encounter (Signed)
-----   Message from Quintella Reichert, MD sent at 08/04/2021 11:09 PM EDT ----- Regarding: RE: Download She told me she was using it some ----- Message ----- From: Reesa Chew, CMA Sent: 08/03/2021   1:28 PM EDT To: Quintella Reichert, MD Subject: Download                                       Patient has not been using her cpap. [place]e [place] ----- Message ----- From: Theresia Majors, RN Sent: 07/16/2021   3:43 PM EDT To: Reesa Chew, CMA  Can you get a download on this patient? Dr. Mayford Knife was having trouble with airview and couldn't get one.  Thanks!

## 2021-08-06 NOTE — Telephone Encounter (Signed)
[employer] reports No days of data.

## 2021-08-08 ENCOUNTER — Telehealth: Payer: Self-pay | Admitting: *Deleted

## 2021-08-08 NOTE — Telephone Encounter (Signed)
Left patient recommendations made by Dr. Mayford Knife on her cell voice mail and asked her to call back with questions or concerns.

## 2021-08-08 NOTE — Telephone Encounter (Signed)
-----   Message from Quintella Reichert, MD sent at 08/06/2021  8:47 PM EDT ----- Regarding: RE: Download Please let patient know that it appears she is not using her device at all.  As at the OV recently, she needs to lay down in her bedroom with her device on when she gets tired and not on the couch - if she is not compliant insurance wil make her turn it in ----- Message ----- From: Reesa Chew, CMA Sent: 08/06/2021   6:31 PM EDT To: Quintella Reichert, MD Subject: RE: Achille Rich reports NO days of data. ----- Message ----- From: Quintella Reichert, MD Sent: 08/04/2021  11:09 PM EDT To: Reesa Chew, CMA Subject: RE: Download                                   She told me she was using it some ----- Message ----- From: Reesa Chew, CMA Sent: 08/03/2021   1:28 PM EDT To: Quintella Reichert, MD Subject: Download                                       Patient has not been using her cpap. [place] ----- Message ----- From: Theresia Majors, RN Sent: 07/16/2021   3:43 PM EDT To: Reesa Chew, CMA  Can you get a download on this patient? Dr. Mayford Knife was having trouble with airview and couldn't get one.  Thanks!

## 2021-08-09 ENCOUNTER — Telehealth (HOSPITAL_COMMUNITY): Payer: Self-pay | Admitting: Emergency Medicine

## 2021-08-09 ENCOUNTER — Other Ambulatory Visit (HOSPITAL_COMMUNITY): Payer: Self-pay | Admitting: Cardiology

## 2021-08-09 DIAGNOSIS — I7121 Aneurysm of the ascending aorta, without rupture: Secondary | ICD-10-CM

## 2021-08-09 NOTE — Telephone Encounter (Signed)
Reaching out to patient to offer assistance regarding upcoming cardiac imaging study; pt verbalizes understanding of appt date/time, parking situation and where to check in, and verified current allergies; name and call back number provided for further questions should they arise Rockwell Alexandria RN Navigator Cardiac Imaging [facility] [phone] office [phone] cell  Denies claustro Denies iv issues Denies implants/metals Zio patch to be removed prior to scan

## 2021-08-09 NOTE — Telephone Encounter (Signed)
Return call: Patient called back to say her hose got stretched and will not work properly and she is going to [facility] to purchase a hose if she can and get back compliant.

## 2021-08-10 ENCOUNTER — Ambulatory Visit (HOSPITAL_COMMUNITY)
Admission: RE | Admit: 2021-08-10 | Discharge: 2021-08-10 | Disposition: A | Discharge status: Home / Self Care | Payer: BC Managed Care – PPO | Source: Ambulatory Visit | Attending: Cardiology | Admitting: Cardiology

## 2021-08-10 ENCOUNTER — Other Ambulatory Visit (HOSPITAL_COMMUNITY): Payer: Self-pay | Admitting: Cardiology

## 2021-08-10 ENCOUNTER — Other Ambulatory Visit: Payer: Self-pay

## 2021-08-10 DIAGNOSIS — I77810 Thoracic aortic ectasia: Secondary | ICD-10-CM | POA: Diagnosis present

## 2021-08-10 DIAGNOSIS — I5032 Chronic diastolic (congestive) heart failure: Secondary | ICD-10-CM | POA: Insufficient documentation

## 2021-08-10 DIAGNOSIS — I7121 Aneurysm of the ascending aorta, without rupture: Secondary | ICD-10-CM

## 2021-08-10 IMAGING — MR MR MRA CHEST W/ OR W/O CM
45 of 48 series · 45 of 48 positions shown · IV contrast (gadavist)
Comparison: none

CLINICAL DATA: Clinical question of aortopathy
Study assumes HCT of 39 and BSA of 2.4 m2

EXAM:
CARDIAC MRI
TECHNIQUE: The patient was scanned on a 1.5 Tesla GE magnet. A dedicated
cardiac coil was used. Functional imaging was done using Fiesta
sequences. [DATE], and 4 chamber views were done to assess for RWMA's.
Modified BIMBY rule using a short axis stack was used to
calculate an ejection fraction on a dedicated work station using
Circle software. The patient received 10 cc of Gadavist. After 10
minutes inversion recovery sequences were used to assess for
infiltration and scar tissue.
CONTRAST:  10 cc  of Gadavist

[Series 4: t2_haste_db_tra_bh · axial · 8.0mm · 1.41mm/px · 1 of 16 slices shown]
[im 1/16]
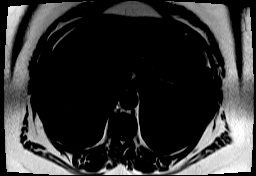

[Series 8: bSSFP · oblique · 8.0mm · 1.61mm/px · 1 of 25 slices shown (1 of 19)]
[im 1/25]
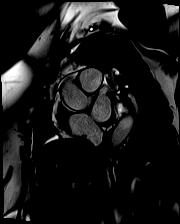

[Series 9: bSSFP · oblique · 8.0mm · 1.61mm/px · 1 of 25 slices shown (2 of 19)]
[im 1/25]
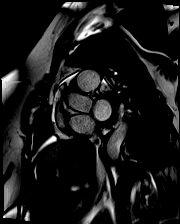

[Series 10: bSSFP · oblique · 8.0mm · 1.61mm/px · 1 of 25 slices shown (3 of 19)]
[im 1/25]
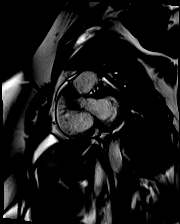

[Series 11: bSSFP · oblique · 8.0mm · 1.61mm/px · 1 of 25 slices shown (4 of 19)]
[im 1/25]
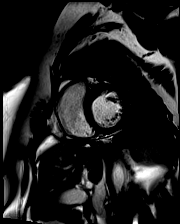

[Series 12: bSSFP · oblique · 8.0mm · 1.61mm/px · 1 of 25 slices shown (5 of 19)]
[im 1/25]
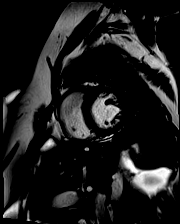

[Series 13: bSSFP · oblique · 8.0mm · 1.61mm/px · 1 of 25 slices shown (6 of 19)]
[im 1/25]
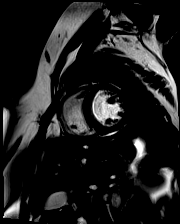

[Series 14: bSSFP · oblique · 8.0mm · 1.61mm/px · 1 of 25 slices shown (7 of 19)]
[im 1/25]
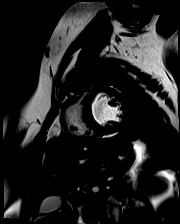

[Series 15: bSSFP · oblique · 8.0mm · 1.61mm/px · 1 of 25 slices shown (8 of 19)]
[im 1/25]
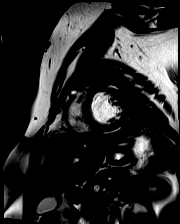

[Series 16: bSSFP · oblique · 8.0mm · 1.61mm/px · 1 of 25 slices shown (9 of 19)]
[im 1/25]
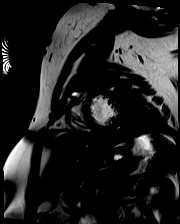

[Series 17: bSSFP · oblique · 8.0mm · 1.61mm/px · 1 of 25 slices shown (10 of 19)]
[im 1/25]
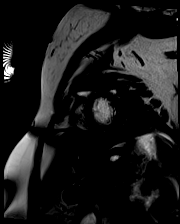

[Series 18: bSSFP · oblique · 8.0mm · 1.61mm/px · 1 of 25 slices shown (11 of 19)]
[im 1/25]
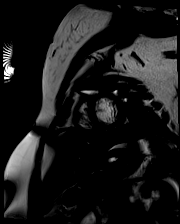

[Series 19: bSSFP · oblique · 8.0mm · 1.61mm/px · 1 of 25 slices shown (12 of 19)]
[im 1/25]
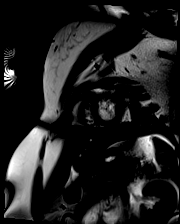

[Series 20: bSSFP · oblique · 8.0mm · 1.61mm/px · 1 of 25 slices shown (13 of 19)]
[im 1/25]
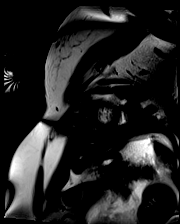

[Series 21: bSSFP · oblique · 8.0mm · 1.61mm/px · 1 of 25 slices shown (14 of 19)]
[im 1/25]
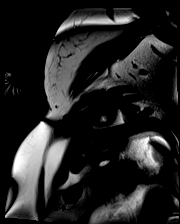

[Series 22: bSSFP · oblique · 8.0mm · 1.61mm/px · 1 of 25 slices shown (15 of 19)]
[im 1/25]
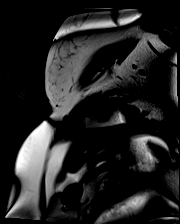

[Series 23: bSSFP · oblique · 8.0mm · 1.61mm/px · 1 of 25 slices shown (16 of 19)]
[im 1/25]
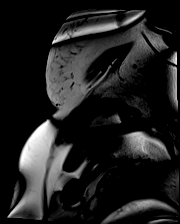

[Series 24: bSSFP · oblique · 6.0mm · 1.52mm/px · 1 of 25 slices shown (17 of 19)]
[im 1/25]
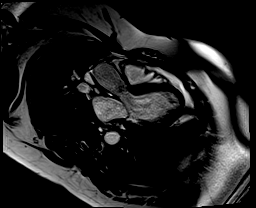

[Series 25: bSSFP · oblique · 6.0mm · 1.41mm/px · 1 of 25 slices shown (18 of 19)]
[im 1/25]
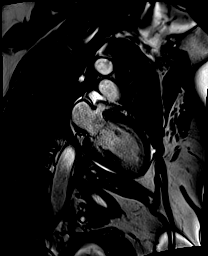

[Series 26: bSSFP · axial · 6.0mm · 1.41mm/px · 1 of 25 slices shown (19 of 19)]
[im 1/25]
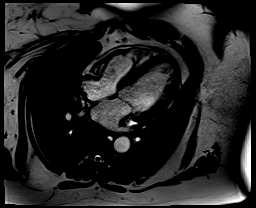

[Series 27: (id)_long_t1 · oblique · 8.0mm · 1.56mm/px · 1 of 24 slices shown]
[im 1/24]
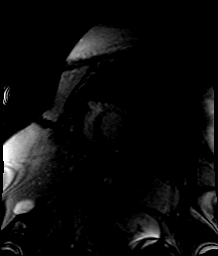

[Series 28: (id)_long_t1_moco · oblique · 8.0mm · 1.56mm/px · 1 of 24 slices shown]
[im 1/24]
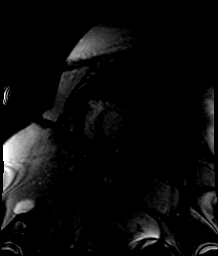

[Series 29: (id)_long_t1_moco_t1 · oblique · 8.0mm · 1.56mm/px · 1 of 6 slices shown]
[im 1/6]
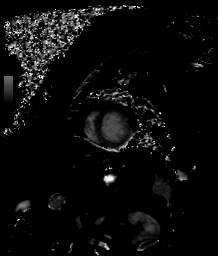

[Series 31: cine_trufi_cs_rt_short axis · oblique · 8.0mm · 1.73mm/px · 1 of 49 slices shown (1 of 16)]
[im 1/49]
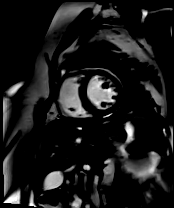

[Series 31: cine_trufi_cs_rt_short axis · oblique · 8.0mm · 1.73mm/px · 1 of 49 slices shown (2 of 16)]
[im 1/49]
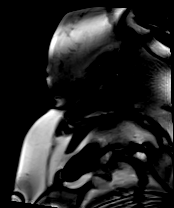

[Series 31: cine_trufi_cs_rt_short axis · oblique · 8.0mm · 1.73mm/px · 1 of 49 slices shown (3 of 16)]
[im 1/49]
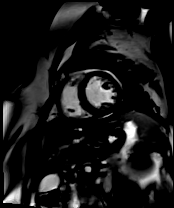

[Series 31: cine_trufi_cs_rt_short axis · oblique · 8.0mm · 1.73mm/px · 1 of 49 slices shown (4 of 16)]
[im 1/49]
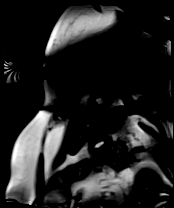

[Series 31: cine_trufi_cs_rt_short axis · oblique · 8.0mm · 1.73mm/px · 1 of 49 slices shown (5 of 16)]
[im 1/49]
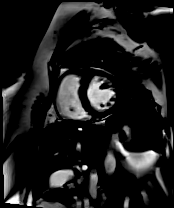

[Series 31: cine_trufi_cs_rt_short axis · oblique · 8.0mm · 1.73mm/px · 1 of 49 slices shown (6 of 16)]
[im 1/49]
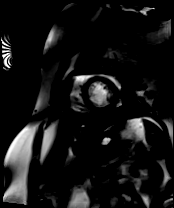

[Series 31: cine_trufi_cs_rt_short axis · oblique · 8.0mm · 1.73mm/px · 1 of 49 slices shown (7 of 16)]
[im 1/49]
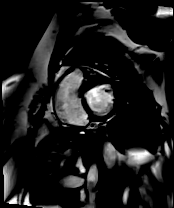

[Series 31: cine_trufi_cs_rt_short axis · oblique · 8.0mm · 1.73mm/px · 1 of 49 slices shown (8 of 16)]
[im 1/49]
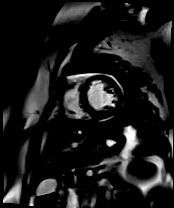

[Series 31: cine_trufi_cs_rt_short axis · oblique · 8.0mm · 1.73mm/px · 1 of 49 slices shown (9 of 16)]
[im 1/49]
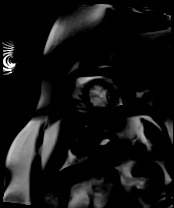

[Series 31: cine_trufi_cs_rt_short axis · oblique · 8.0mm · 1.73mm/px · 1 of 49 slices shown (10 of 16)]
[im 1/49]
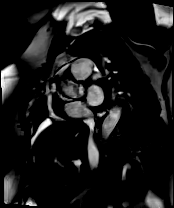

[Series 31: cine_trufi_cs_rt_short axis · oblique · 8.0mm · 1.73mm/px · 1 of 49 slices shown (11 of 16)]
[im 1/49]
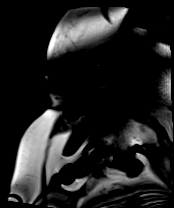

[Series 31: cine_trufi_cs_rt_short axis · oblique · 8.0mm · 1.73mm/px · 1 of 49 slices shown (12 of 16)]
[im 1/49]
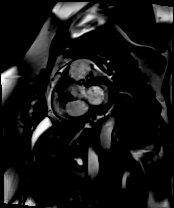

[Series 31: cine_trufi_cs_rt_short axis · oblique · 8.0mm · 1.73mm/px · 1 of 49 slices shown (13 of 16)]
[im 1/49]
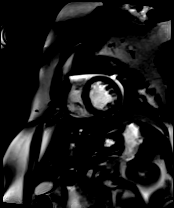

[Series 31: cine_trufi_cs_rt_short axis · oblique · 8.0mm · 1.73mm/px · 1 of 49 slices shown (14 of 16)]
[im 1/49]
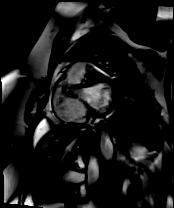

[Series 31: cine_trufi_cs_rt_short axis · oblique · 8.0mm · 1.73mm/px · 1 of 49 slices shown (15 of 16)]
[im 1/49]
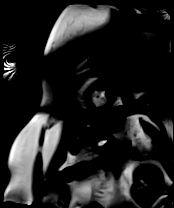

[Series 31: cine_trufi_cs_rt_short axis · oblique · 8.0mm · 1.73mm/px · 1 of 49 slices shown (16 of 16)]
[im 1/49]
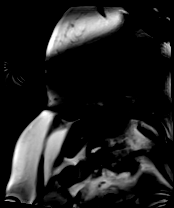

[Series 32: (id)_trufi · oblique · 8.0mm · 2.08mm/px · 1 of 9 slices shown]
[im 1/9]
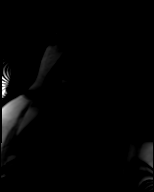

[Series 33: (id)_trufi_moco · oblique · 8.0mm · 2.08mm/px · 1 of 9 slices shown]
[im 1/9]
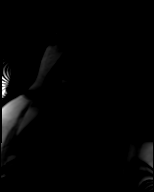

[Series 34: (id)_trufi_moco_t2 · oblique · 8.0mm · 2.08mm/px · 1 of 3 slices shown]
[im 1/3]
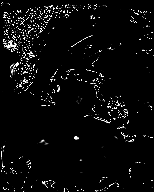

[Series 36: (person_name)_(person_name)_(person_name) · sagittal · 8.0mm · 1.88mm/px · 1 of 25 slices shown (1 of 3)]
[im 1/25]
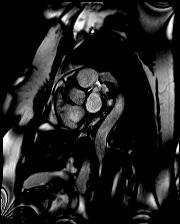

[Series 36: (person_name)_(person_name)_(person_name) · sagittal · 8.0mm · 1.88mm/px · 1 of 25 slices shown (2 of 3)]
[im 1/25]
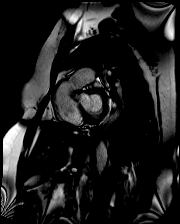

[Series 36: (person_name)_(person_name)_(person_name) · sagittal · 8.0mm · 1.88mm/px · 1 of 25 slices shown (3 of 3)]
[im 1/25]
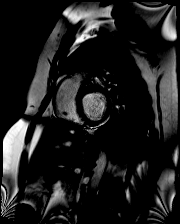

[45 of 48 positions shown; findings below may reference images not displayed]

FINDINGS: 1. Normal left ventricular size, with LVEDD 55 mm, and LVEDVi 77
mL/m2.

Normal left ventricular thickness, with intraventricular septal
thickness of 12 mm, posterior wall thickness of 6 mm, and myocardial
mass index of 51 g/m2.

Normal left ventricular systolic function (LVEF =56%). There are no
regional wall motion abnormalities.

Left ventricular parametric mapping notable for normal ECV and T2
signal.

There is no late gadolinium enhancement in the left ventricular
myocardium.

2. Normal right ventricular size with RVEDVI 70 mL/m2.

Normal right ventricular thickness.

Normal right ventricular systolic function (RVEF = 56%). There are
no regional wall motion abnormalities or aneurysms.

3.  Normal left and right atrial size.

4. Aortic MRA: The great vessels arose normally from the arch.

There was no evidence of coarctation.

The following measurements were obtained:

Sinus of Valsalva: 26 mm

Sinotubular Junction: 25 mm

Ascending Aorta: 39 mm; this is at the upper limits of normal for
age and body surface area.

Aortic Arch: 25 mm

Descending Thoracic Aorta: 25 mm

Moderate to severe dilation of the main pulmonary artery 35 mm with
trunk to aorta ratio of 0.9. This can be associated with the
presence of pulmonary hypertension; clinical correlation advised.

5.  No significant valvular abnormalities.

Tri-leaflet aortic valve.

No significant different in LV and RV stroke volume.

6. Normal pericardium. No pericardial effusion. Prominent
pericardial fat anterior the right ventricle

7. Grossly, no extracardiac findings. Recommended dedicated study if
concerned for non-cardiac pathology.
IMPRESSION: Ascending Aorta is at the upper limit of normal.

Moderate to severe dilation of the main pulmonary artery 35 mm with
trunk to aorta ratio of 0.9. This can be associated with the
presence of pulmonary hypertension.

## 2021-08-10 IMAGING — MR MR CARD MORPHOLOGY WO/W CM
45 of 48 series · 45 of 48 positions shown · IV contrast (gadavist)
Comparison: none

CLINICAL DATA: Clinical question of aortopathy
Study assumes HCT of 39 and BSA of 2.4 m2

EXAM:
CARDIAC MRI
TECHNIQUE: The patient was scanned on a 1.5 Tesla GE magnet. A dedicated
cardiac coil was used. Functional imaging was done using Fiesta
sequences. [DATE], and 4 chamber views were done to assess for RWMA's.
Modified BIMBY rule using a short axis stack was used to
calculate an ejection fraction on a dedicated work station using
Circle software. The patient received 10 cc of Gadavist. After 10
minutes inversion recovery sequences were used to assess for
infiltration and scar tissue.
CONTRAST:  10 cc  of Gadavist

[Series 4: t2_haste_db_tra_bh · axial · 8.0mm · 1.41mm/px · 1 of 16 slices shown]
[im 1/16]
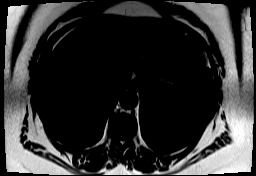

[Series 8: bSSFP · oblique · 8.0mm · 1.61mm/px · 1 of 25 slices shown (1 of 19)]
[im 1/25]
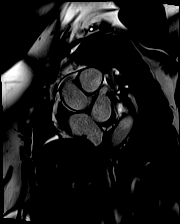

[Series 9: bSSFP · oblique · 8.0mm · 1.61mm/px · 1 of 25 slices shown (2 of 19)]
[im 1/25]
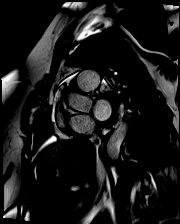

[Series 10: bSSFP · oblique · 8.0mm · 1.61mm/px · 1 of 25 slices shown (3 of 19)]
[im 1/25]
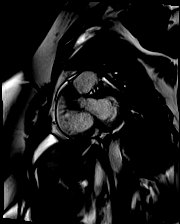

[Series 11: bSSFP · oblique · 8.0mm · 1.61mm/px · 1 of 25 slices shown (4 of 19)]
[im 1/25]
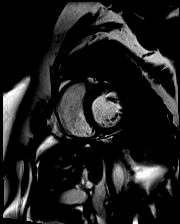

[Series 12: bSSFP · oblique · 8.0mm · 1.61mm/px · 1 of 25 slices shown (5 of 19)]
[im 1/25]
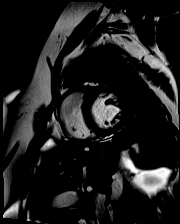

[Series 13: bSSFP · oblique · 8.0mm · 1.61mm/px · 1 of 25 slices shown (6 of 19)]
[im 1/25]
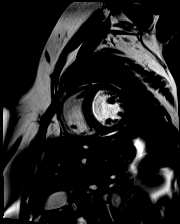

[Series 14: bSSFP · oblique · 8.0mm · 1.61mm/px · 1 of 25 slices shown (7 of 19)]
[im 1/25]
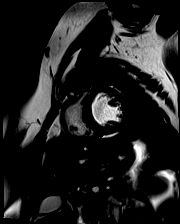

[Series 15: bSSFP · oblique · 8.0mm · 1.61mm/px · 1 of 25 slices shown (8 of 19)]
[im 1/25]
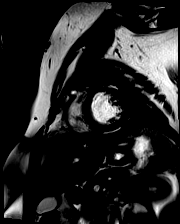

[Series 16: bSSFP · oblique · 8.0mm · 1.61mm/px · 1 of 25 slices shown (9 of 19)]
[im 1/25]
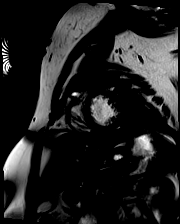

[Series 17: bSSFP · oblique · 8.0mm · 1.61mm/px · 1 of 25 slices shown (10 of 19)]
[im 1/25]
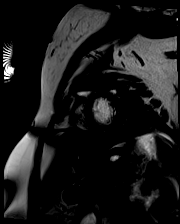

[Series 18: bSSFP · oblique · 8.0mm · 1.61mm/px · 1 of 25 slices shown (11 of 19)]
[im 1/25]
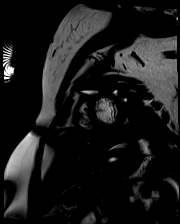

[Series 19: bSSFP · oblique · 8.0mm · 1.61mm/px · 1 of 25 slices shown (12 of 19)]
[im 1/25]
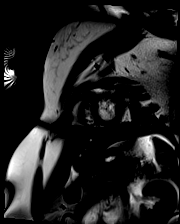

[Series 20: bSSFP · oblique · 8.0mm · 1.61mm/px · 1 of 25 slices shown (13 of 19)]
[im 1/25]
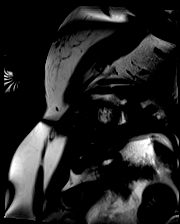

[Series 21: bSSFP · oblique · 8.0mm · 1.61mm/px · 1 of 25 slices shown (14 of 19)]
[im 1/25]
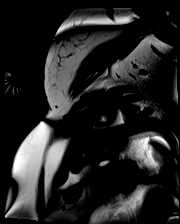

[Series 22: bSSFP · oblique · 8.0mm · 1.61mm/px · 1 of 25 slices shown (15 of 19)]
[im 1/25]
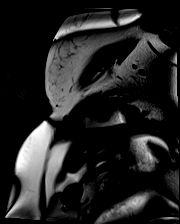

[Series 23: bSSFP · oblique · 8.0mm · 1.61mm/px · 1 of 25 slices shown (16 of 19)]
[im 1/25]
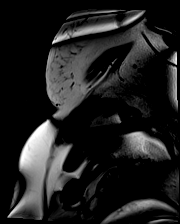

[Series 24: bSSFP · oblique · 6.0mm · 1.52mm/px · 1 of 25 slices shown (17 of 19)]
[im 1/25]
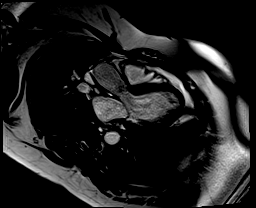

[Series 25: bSSFP · oblique · 6.0mm · 1.41mm/px · 1 of 25 slices shown (18 of 19)]
[im 1/25]
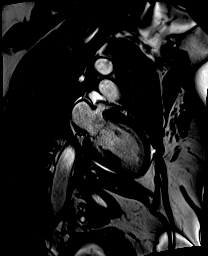

[Series 26: bSSFP · axial · 6.0mm · 1.41mm/px · 1 of 25 slices shown (19 of 19)]
[im 1/25]
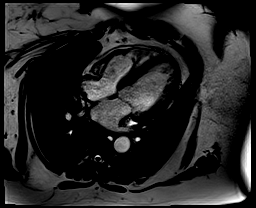

[Series 27: (id)_long_t1 · oblique · 8.0mm · 1.56mm/px · 1 of 24 slices shown]
[im 1/24]
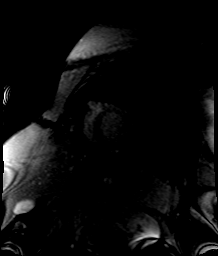

[Series 28: (id)_long_t1_moco · oblique · 8.0mm · 1.56mm/px · 1 of 24 slices shown]
[im 1/24]
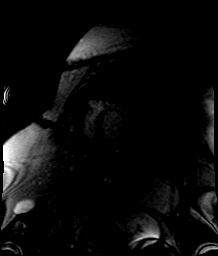

[Series 29: (id)_long_t1_moco_t1 · oblique · 8.0mm · 1.56mm/px · 1 of 6 slices shown]
[im 1/6]
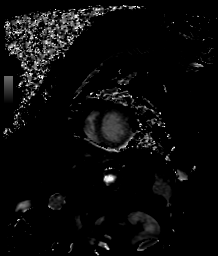

[Series 31: cine_trufi_cs_rt_short axis · oblique · 8.0mm · 1.73mm/px · 1 of 49 slices shown (1 of 16)]
[im 1/49]
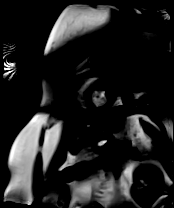

[Series 31: cine_trufi_cs_rt_short axis · oblique · 8.0mm · 1.73mm/px · 1 of 49 slices shown (2 of 16)]
[im 1/49]
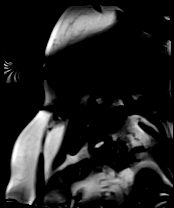

[Series 31: cine_trufi_cs_rt_short axis · oblique · 8.0mm · 1.73mm/px · 1 of 49 slices shown (3 of 16)]
[im 1/49]
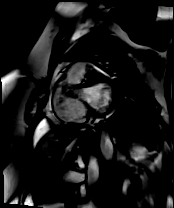

[Series 31: cine_trufi_cs_rt_short axis · oblique · 8.0mm · 1.73mm/px · 1 of 49 slices shown (4 of 16)]
[im 1/49]
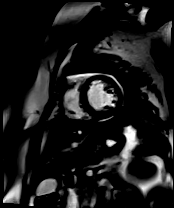

[Series 31: cine_trufi_cs_rt_short axis · oblique · 8.0mm · 1.73mm/px · 1 of 49 slices shown (5 of 16)]
[im 1/49]
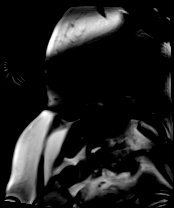

[Series 31: cine_trufi_cs_rt_short axis · oblique · 8.0mm · 1.73mm/px · 1 of 49 slices shown (6 of 16)]
[im 1/49]
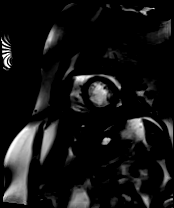

[Series 31: cine_trufi_cs_rt_short axis · oblique · 8.0mm · 1.73mm/px · 1 of 49 slices shown (7 of 16)]
[im 1/49]
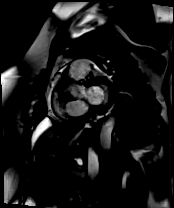

[Series 31: cine_trufi_cs_rt_short axis · oblique · 8.0mm · 1.73mm/px · 1 of 49 slices shown (8 of 16)]
[im 1/49]
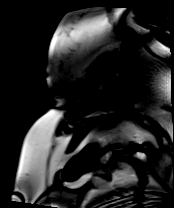

[Series 31: cine_trufi_cs_rt_short axis · oblique · 8.0mm · 1.73mm/px · 1 of 49 slices shown (9 of 16)]
[im 1/49]
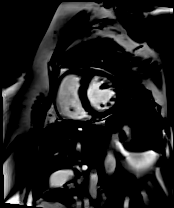

[Series 31: cine_trufi_cs_rt_short axis · oblique · 8.0mm · 1.73mm/px · 1 of 49 slices shown (10 of 16)]
[im 1/49]
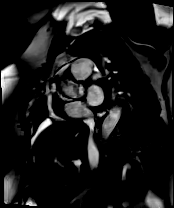

[Series 31: cine_trufi_cs_rt_short axis · oblique · 8.0mm · 1.73mm/px · 1 of 49 slices shown (11 of 16)]
[im 1/49]
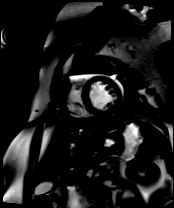

[Series 31: cine_trufi_cs_rt_short axis · oblique · 8.0mm · 1.73mm/px · 1 of 49 slices shown (12 of 16)]
[im 1/49]
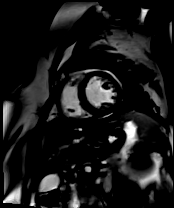

[Series 31: cine_trufi_cs_rt_short axis · oblique · 8.0mm · 1.73mm/px · 1 of 49 slices shown (13 of 16)]
[im 1/49]
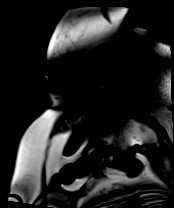

[Series 31: cine_trufi_cs_rt_short axis · oblique · 8.0mm · 1.73mm/px · 1 of 49 slices shown (14 of 16)]
[im 1/49]
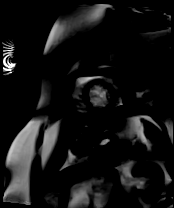

[Series 31: cine_trufi_cs_rt_short axis · oblique · 8.0mm · 1.73mm/px · 1 of 49 slices shown (15 of 16)]
[im 1/49]
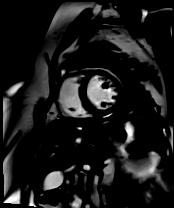

[Series 31: cine_trufi_cs_rt_short axis · oblique · 8.0mm · 1.73mm/px · 1 of 49 slices shown (16 of 16)]
[im 1/49]
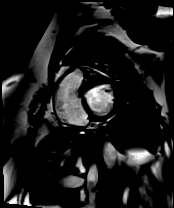

[Series 32: (id)_trufi · oblique · 8.0mm · 2.08mm/px · 1 of 9 slices shown]
[im 1/9]
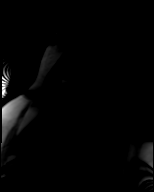

[Series 33: (id)_trufi_moco · oblique · 8.0mm · 2.08mm/px · 1 of 9 slices shown]
[im 1/9]
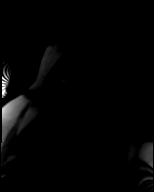

[Series 34: (id)_trufi_moco_t2 · oblique · 8.0mm · 2.08mm/px · 1 of 3 slices shown]
[im 1/3]
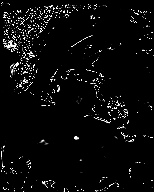

[Series 36: (person_name)_(person_name)_(person_name) · sagittal · 8.0mm · 1.88mm/px · 1 of 25 slices shown (1 of 3)]
[im 1/25]
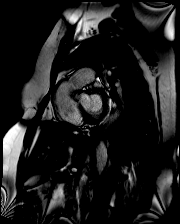

[Series 36: (person_name)_(person_name)_(person_name) · sagittal · 8.0mm · 1.88mm/px · 1 of 25 slices shown (2 of 3)]
[im 1/25]
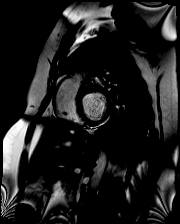

[Series 36: (person_name)_(person_name)_(person_name) · sagittal · 8.0mm · 1.88mm/px · 1 of 25 slices shown (3 of 3)]
[im 1/25]
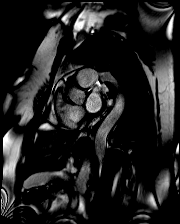

[45 of 48 positions shown; findings below may reference images not displayed]

FINDINGS: 1. Normal left ventricular size, with LVEDD 55 mm, and LVEDVi 77
mL/m2.

Normal left ventricular thickness, with intraventricular septal
thickness of 12 mm, posterior wall thickness of 6 mm, and myocardial
mass index of 51 g/m2.

Normal left ventricular systolic function (LVEF =56%). There are no
regional wall motion abnormalities.

Left ventricular parametric mapping notable for normal ECV and T2
signal.

There is no late gadolinium enhancement in the left ventricular
myocardium.

2. Normal right ventricular size with RVEDVI 70 mL/m2.

Normal right ventricular thickness.

Normal right ventricular systolic function (RVEF = 56%). There are
no regional wall motion abnormalities or aneurysms.

3.  Normal left and right atrial size.

4. Aortic MRA: The great vessels arose normally from the arch.

There was no evidence of coarctation.

The following measurements were obtained:

Sinus of Valsalva: 26 mm

Sinotubular Junction: 25 mm

Ascending Aorta: 39 mm; this is at the upper limits of normal for
age and body surface area.

Aortic Arch: 25 mm

Descending Thoracic Aorta: 25 mm

Moderate to severe dilation of the main pulmonary artery 35 mm with
trunk to aorta ratio of 0.9. This can be associated with the
presence of pulmonary hypertension; clinical correlation advised.

5.  No significant valvular abnormalities.

Tri-leaflet aortic valve.

No significant different in LV and RV stroke volume.

6. Normal pericardium. No pericardial effusion. Prominent
pericardial fat anterior the right ventricle

7. Grossly, no extracardiac findings. Recommended dedicated study if
concerned for non-cardiac pathology.
IMPRESSION: Ascending Aorta is at the upper limit of normal.

Moderate to severe dilation of the main pulmonary artery 35 mm with
trunk to aorta ratio of 0.9. This can be associated with the
presence of pulmonary hypertension.

## 2021-08-10 MED ORDER — GADOBUTROL 1 MMOL/ML IV SOLN
10.0000 mL | Freq: Once | INTRAVENOUS | Status: AC | PRN
  Administered 2021-08-10: 10 mL via INTRAVENOUS

## 2021-08-13 ENCOUNTER — Encounter (HOSPITAL_BASED_OUTPATIENT_CLINIC_OR_DEPARTMENT_OTHER): Payer: Self-pay

## 2021-08-13 ENCOUNTER — Telehealth: Payer: Self-pay

## 2021-08-13 DIAGNOSIS — I5032 Chronic diastolic (congestive) heart failure: Secondary | ICD-10-CM

## 2021-08-13 DIAGNOSIS — I77810 Thoracic aortic ectasia: Secondary | ICD-10-CM

## 2021-08-13 NOTE — Telephone Encounter (Signed)
The patient has been notified of the result and verbalized understanding.  All questions (if any) were answered. Theresia Majors, RN 08/13/2021 1:47 PM

## 2021-08-13 NOTE — Telephone Encounter (Signed)
-----   Message from Quintella Reichert, MD sent at 08/12/2021 10:12 PM EDT ----- cMRI showed moderate to severely dilated Pulmonary artery > please get 2D echo to assess main PA size and eval for pulmonary HTN

## 2021-08-14 ENCOUNTER — Encounter: Payer: Self-pay | Admitting: Cardiology

## 2021-08-14 DIAGNOSIS — I288 Other diseases of pulmonary vessels: Secondary | ICD-10-CM | POA: Insufficient documentation

## 2021-08-21 ENCOUNTER — Other Ambulatory Visit: Payer: Self-pay

## 2021-08-21 ENCOUNTER — Ambulatory Visit (HOSPITAL_COMMUNITY): Payer: BC Managed Care – PPO | Attending: Internal Medicine

## 2021-08-21 DIAGNOSIS — I77810 Thoracic aortic ectasia: Secondary | ICD-10-CM | POA: Diagnosis not present

## 2021-08-21 DIAGNOSIS — I5032 Chronic diastolic (congestive) heart failure: Secondary | ICD-10-CM | POA: Insufficient documentation

## 2021-08-21 LAB — ECHOCARDIOGRAM COMPLETE
Area-P 1/2: 2.91 cm2
S' Lateral: 3 cm

## 2021-08-21 MED ORDER — PERFLUTREN LIPID MICROSPHERE
1.0000 mL | INTRAVENOUS | Status: AC | PRN
  Administered 2021-08-21: 1 mL via INTRAVENOUS

## 2021-08-22 ENCOUNTER — Telehealth: Payer: Self-pay | Admitting: Cardiology

## 2021-08-22 NOTE — Telephone Encounter (Signed)
-----   Message from Alver Sorrow, NP sent at 08/22/2021  7:28 AM EDT ----- Monitor with predominantly NSR. 2 runs of a fast heart beat called SVT which were very short. Rare early beats. No significant arrhythmia nor pause which would have caused syncope. Good result! Follow up as scheduled.

## 2021-08-22 NOTE — Telephone Encounter (Signed)
Patient was returning a call to discuss monitor results. Please call after 3:00. Patient is at the dentist

## 2021-08-22 NOTE — Telephone Encounter (Signed)
The patient has been notified of the result and verbalized understanding.  All questions (if any) were answered. Loa Socks, LPN [id number] 9:52 PM

## 2021-08-27 ENCOUNTER — Encounter (HOSPITAL_BASED_OUTPATIENT_CLINIC_OR_DEPARTMENT_OTHER): Payer: Self-pay

## 2021-09-17 ENCOUNTER — Ambulatory Visit: Payer: BC Managed Care – PPO | Admitting: Internal Medicine

## 2021-09-17 ENCOUNTER — Other Ambulatory Visit: Payer: Self-pay

## 2021-09-17 ENCOUNTER — Encounter: Payer: Self-pay | Admitting: Internal Medicine

## 2021-09-17 VITALS — BP 138/78 | HR 71 | Temp 98.1°F | Ht 62.4 in | Wt 272.8 lb

## 2021-09-17 DIAGNOSIS — I110 Hypertensive heart disease with heart failure: Secondary | ICD-10-CM | POA: Diagnosis not present

## 2021-09-17 DIAGNOSIS — Z6841 Body Mass Index (BMI) 40.0 and over, adult: Secondary | ICD-10-CM

## 2021-09-17 DIAGNOSIS — I5032 Chronic diastolic (congestive) heart failure: Secondary | ICD-10-CM | POA: Diagnosis not present

## 2021-09-17 DIAGNOSIS — E119 Type 2 diabetes mellitus without complications: Secondary | ICD-10-CM

## 2021-09-17 DIAGNOSIS — E1159 Type 2 diabetes mellitus with other circulatory complications: Secondary | ICD-10-CM

## 2021-09-17 MED ORDER — OZEMPIC (1 MG/DOSE) 4 MG/3ML ~~LOC~~ SOPN: 1.0000 mg | PEN_INJECTOR | SUBCUTANEOUS | 1 refills | Status: DC

## 2021-09-17 NOTE — Progress Notes (Signed)
I,Tianna Badgett,acting as a Neurosurgeon for [name], MD.,have documented all relevant documentation on the behalf of [name], MD,as directed by  [name], MD while in the presence of [name], MD.  This visit occurred during the SARS-CoV-2 public health emergency.  Safety protocols were in place, including screening questions prior to the visit, additional usage of staff PPE, and extensive cleaning of exam room while observing appropriate contact time as indicated for disinfecting solutions.  Subjective:     Patient ID: Jessica Snow , female    DOB: Jun 27, 1966 , 55 y.o.   MRN: [id number]   Chief Complaint  Patient presents with   Diabetes   Hypertension    HPI  The patient is here today for a follow-up on diabetes. She recently started Ozempic. She is currently on 0.5mg  weekly. She feels it has helped to decrease her appetite.   Diabetes She presents for her follow-up diabetic visit. She has type 2 diabetes mellitus. There are no hypoglycemic associated symptoms. There are no diabetic associated symptoms. There are no hypoglycemic complications. Risk factors for coronary artery disease include diabetes mellitus, obesity, hypertension and sedentary lifestyle.    Past Medical History:  Diagnosis Date   Ascending aorta dilatation (HCC)    Asthma    Chronic diastolic CHF (congestive heart failure) (HCC)    Chronic rhinitis    Cough    Dilation of pulmonary artery (HCC)    Edema, lower extremity    GERD (gastroesophageal reflux disease)    HTN (hypertension)    Mild aortic insufficiency    Morbid obesity (HCC)    Murmur    Pneumonia    Pre-diabetes      Family History  Problem Relation Age of Onset   Hypertension Mother    Diabetes Mother    Heart disease Mother    Sleep apnea Mother    Obesity Mother    Hypertension Father    Anemia Father      Current Outpatient Medications:    albuterol (VENTOLIN HFA) 108 (90 Base) MCG/ACT inhaler, TAKE  2 PUFFS BY MOUTH EVERY 4 TO 6 HOURS AS NEEDED, Disp: 18 g, Rfl: 3   Azelastine-Fluticasone 137-50 MCG/ACT SUSP, PLACE 1 SPRAY INTO THE NOSE 2 (TWO) TIMES DAILY., Disp: 23 g, Rfl: 5   EPINEPHrine 0.3 mg/0.3 mL IJ SOAJ injection, SMARTSIG:0.3 Milligram(s) IM Once PRN, Disp: 1 each, Rfl: 2   famotidine (PEPCID) 20 MG tablet, TAKE 1 TABLET BY MOUTH EVERYDAY AT BEDTIME, Disp: 90 tablet, Rfl: 0   hydrALAZINE (APRESOLINE) 50 MG tablet, Take 1 tablet (50 mg total) by mouth 3 (three) times daily., Disp: 270 tablet, Rfl: 3   levocetirizine (XYZAL) 5 MG tablet, TAKE 1 TABLET BY MOUTH EVERY DAY IN THE EVENING, Disp: 90 tablet, Rfl: 0   montelukast (SINGULAIR) 10 MG tablet, TAKE 1 TABLET BY MOUTH EVERYDAY AT BEDTIME, Disp: 90 tablet, Rfl: 0   nystatin (NYSTATIN) powder, Apply 1 application topically 3 (three) times daily., Disp: 15 g, Rfl: 0   nystatin cream (MYCOSTATIN), Apply 1 application topically 2 (two) times daily., Disp: 30 g, Rfl: 2   nystatin powder, Apply 1 application topically 2 (two) times daily as needed., Disp: 30 g, Rfl: 1   olmesartan (BENICAR) 40 MG tablet, TAKE 1 TABLET BY MOUTH EVERY DAY, Disp: 90 tablet, Rfl: 1   OZEMPIC, 1 MG/DOSE, 4 MG/3ML SOPN, INJECT 1MG  INTO THE SKIN ONCE A WEEK, Disp: 3 mL, Rfl: 3   pantoprazole (  PROTONIX) 40 MG tablet, Take 1 tablet by mouth daily, Disp: 180 tablet, Rfl: 0   spironolactone (ALDACTONE) 50 MG tablet, Take 1 tablet (50 mg total) by mouth daily., Disp: 90 tablet, Rfl: 3   SYMBICORT 160-4.5 MCG/ACT inhaler, TAKE 2 PUFFS BY MOUTH TWICE A DAY, Disp: 30.6 each, Rfl: 1   Allergies  Allergen Reactions   Honey Bee Treatment [Bee Venom]    Wheat Bran      Review of Systems  Constitutional: Negative.   Respiratory: Negative.    Cardiovascular: Negative.   Gastrointestinal: Negative.   Neurological: Negative.   Psychiatric/Behavioral: Negative.      Today's Vitals   09/17/21 1443  BP: 138/78  Pulse: 71  Temp: 98.1 F (36.7 C)  TempSrc: Oral   Weight: 272 lb 12.8 oz (123.7 kg)  Height: 5' 2.4" (1.585 m)   Body mass index is 49.26 kg/m.  Wt Readings from Last 3 Encounters:  09/17/21 272 lb 12.8 oz (123.7 kg)  07/31/21 273 lb (123.8 kg)  07/31/21 271 lb 1.6 oz (123 kg)    Objective:  Physical Exam Vitals and nursing note reviewed.  Constitutional:      Appearance: Normal appearance. She is obese.  HENT:     Head: Normocephalic and atraumatic.     Nose:     Comments: Masked     Mouth/Throat:     Comments: Masked  Eyes:     Extraocular Movements: Extraocular movements intact.  Cardiovascular:     Rate and Rhythm: Normal rate and regular rhythm.     Heart sounds: Normal heart sounds.  Pulmonary:     Effort: Pulmonary effort is normal.     Breath sounds: Normal breath sounds.  Musculoskeletal:     Cervical back: Normal range of motion.  Skin:    General: Skin is warm.  Neurological:     General: No focal deficit present.     Mental Status: She is alert.  Psychiatric:        Mood and Affect: Mood normal.        Behavior: Behavior normal.        Assessment And Plan:     1. Type 2 diabetes mellitus without complication, without long-term current use of insulin (HCC) Comments: Newly diagnosed. I will increase her Ozempic dose to 0.75mg  x 4 weeks, then increase to 1mg  weekly. Reminded she may have nausea, stop eating when full. She agrees to f/u in 6-8 weeks for re-evaluation.   2. Hypertensive heart disease with chronic diastolic congestive heart failure (HCC) Comments: Chronic, fair control. Goal <130/80. Encouraged to follow low sodium diet. No med changes today.   3. Class 3 severe obesity due to excess calories with serious comorbidity and body mass index (BMI) of 45.0 to 49.9 in adult [facility]) Comments: BMI 49.  She is encouraged to aim for at least 150 minutes of exercise per week. Advised to initially strive to lose 15 lbs to decrease cardiac risk.   Patient was given opportunity to ask questions. Patient  verbalized understanding of the plan and was able to repeat key elements of the plan. All questions were answered to their satisfaction.   I, [facility], MD, have reviewed all documentation for this visit. The documentation on 09/17/21 for the exam, diagnosis, procedures, and orders are all accurate and complete.   IF YOU HAVE BEEN REFERRED TO A SPECIALIST, IT MAY TAKE 1-2 WEEKS TO SCHEDULE/PROCESS THE REFERRAL. IF YOU HAVE NOT HEARD FROM US/SPECIALIST IN TWO  WEEKS, PLEASE GIVE [place] A CALL AT [phone].   THE PATIENT IS ENCOURAGED TO PRACTICE SOCIAL DISTANCING DUE TO THE COVID-19 PANDEMIC.

## 2021-09-17 NOTE — Patient Instructions (Addendum)
Please increase to 0.75mg  Ozempic weekly.  You will inject 0.5mg  PLUS 0.25mg  on Tuesdays  Diabetes Mellitus and Exercise Exercising regularly is important for overall health, especially for people who have diabetes mellitus. Exercising is not only about losing weight. It has many other health benefits, such as increasing muscle strength and bone density and reducing body fat and stress. This leads to improved fitness, flexibility, and endurance, all of which result in better overall health. What are the benefits of exercise if I have diabetes? Exercise has many benefits for people with diabetes. They include: Helping to lower and control blood sugar (glucose). Helping the body to respond better to the hormone insulin by improving insulin sensitivity. Reducing how much insulin the body needs. Lowering the risk for heart disease by: Lowering "bad" cholesterol and triglyceride levels. Increasing "good" cholesterol levels. Lowering blood pressure. Lowering blood glucose levels. What is my activity plan? Your health care provider or certified diabetes educator can help you make a plan for the type and frequency of exercise that works for you. This is called your activity plan. Be sure to: Get at least 150 minutes of medium-intensity or high-intensity exercise each week. Exercises may include brisk walking, biking, or water aerobics. Do stretching and strengthening exercises, such as yoga or weight lifting, at least 2 times a week. Spread out your activity over at least 3 days of the week. Get some form of physical activity each day. Do not go more than 2 days in a row without some kind of physical activity. Avoid being inactive for more than 90 minutes at a time. Take frequent breaks to walk or stretch. Choose exercises or activities that you enjoy. Set realistic goals. Start slowly and gradually increase your exercise intensity over time. How do I manage my diabetes during exercise? Monitor  your blood glucose Check your blood glucose before and after exercising. If your blood glucose is: 240 mg/dL (58.5 mmol/L) or higher before you exercise, check your urine for ketones. These are chemicals created by the liver. If you have ketones in your urine, do not exercise until your blood glucose returns to normal. 100 mg/dL (5.6 mmol/L) or lower, eat a snack containing 15-20 grams of carbohydrate. Check your blood glucose 15 minutes after the snack to make sure that your glucose level is above 100 mg/dL (5.6 mmol/L) before you start your exercise. Know the symptoms of low blood glucose (hypoglycemia) and how to treat it. Your risk for hypoglycemia increases during and after exercise. Follow these tips and your health care provider's instructions Keep a carbohydrate snack that is fast-acting for use before, during, and after exercise to help prevent or treat hypoglycemia. Avoid injecting insulin into areas of the body that are going to be exercised. For example, avoid injecting insulin into: Your arms, when you are about to play tennis. Your legs, when you are about to go jogging. Keep records of your exercise habits. Doing this can help you and your health care provider adjust your diabetes management plan as needed. Write down: Food that you eat before and after you exercise. Blood glucose levels before and after you exercise. The type and amount of exercise you have done. Work with your health care provider when you start a new exercise or activity. He or she may need to: Make sure that the activity is safe for you. Adjust your insulin, other medicines, and food that you eat. Drink plenty of water while you exercise. This prevents loss of water (dehydration) and problems  caused by a lot of heat in the body (heat stroke). Where to find more information American Diabetes Association: www.diabetes.org Summary Exercising regularly is important for overall health, especially for people who  have diabetes mellitus. Exercising has many health benefits. It increases muscle strength and bone density and reduces body fat and stress. It also lowers and controls blood glucose. Your health care provider or certified diabetes educator can help you make an activity plan for the type and frequency of exercise that works for you. Work with your health care provider to make sure any new activity is safe for you. Also work with your health care provider to adjust your insulin, other medicines, and the food you eat. This information is not intended to replace advice given to you by your health care provider. Make sure you discuss any questions you have with your health care provider. Document Revised: 07/12/2019 Document Reviewed: 07/12/2019 Elsevier Patient Education  2022 [employer].

## 2021-09-18 ENCOUNTER — Other Ambulatory Visit: Payer: Self-pay | Admitting: Internal Medicine

## 2021-09-23 MED ORDER — NYSTATIN 100000 UNIT/GM EX POWD: 1.0000 "application " | Freq: Two times a day (BID) | CUTANEOUS | 1 refills | Status: DC | PRN

## 2021-10-04 ENCOUNTER — Ambulatory Visit: Payer: BC Managed Care – PPO | Admitting: Internal Medicine

## 2021-10-26 ENCOUNTER — Other Ambulatory Visit: Payer: Self-pay | Admitting: Allergy

## 2021-10-30 ENCOUNTER — Other Ambulatory Visit: Payer: Self-pay

## 2021-10-30 ENCOUNTER — Encounter: Payer: Self-pay | Admitting: Nurse Practitioner

## 2021-10-30 ENCOUNTER — Ambulatory Visit: Payer: BC Managed Care – PPO | Admitting: Nurse Practitioner

## 2021-10-30 VITALS — BP 130/82 | HR 73 | Temp 97.8°F | Ht 62.8 in | Wt 274.8 lb

## 2021-10-30 DIAGNOSIS — I5032 Chronic diastolic (congestive) heart failure: Secondary | ICD-10-CM | POA: Diagnosis not present

## 2021-10-30 DIAGNOSIS — Z6841 Body Mass Index (BMI) 40.0 and over, adult: Secondary | ICD-10-CM

## 2021-10-30 DIAGNOSIS — I110 Hypertensive heart disease with heart failure: Secondary | ICD-10-CM | POA: Diagnosis not present

## 2021-10-30 DIAGNOSIS — E119 Type 2 diabetes mellitus without complications: Secondary | ICD-10-CM

## 2021-10-30 DIAGNOSIS — E1159 Type 2 diabetes mellitus with other circulatory complications: Secondary | ICD-10-CM | POA: Diagnosis not present

## 2021-10-30 NOTE — Patient Instructions (Signed)

## 2021-10-30 NOTE — Progress Notes (Signed)
I,Tianna Badgett,acting as a Education administrator for [employer], NP.,have documented all relevant documentation on the behalf of [employer], NP,as directed by  Bary Castilla, NP while in the presence of Bary Castilla, NP.  This visit occurred during the SARS-CoV-2 public health emergency.  Safety protocols were in place, including screening questions prior to the visit, additional usage of staff PPE, and extensive cleaning of exam room while observing appropriate contact time as indicated for disinfecting solutions.  Subjective:     Patient ID: Jessica Snow , female    DOB: 05-06-1966 , 56 y.o.   MRN: [id number]   Chief Complaint  Patient presents with   Diabetes    HPI  The patient is here today for a follow-up on diabetes and HTN. She reports compliance with meds. Admits she is not exercising as much as she should.  Exercise: She is trying to walk  Wt Readings from Last 3 Encounters: 10/30/21 : 274 lb 12.8 oz (124.6 kg) 09/17/21 : 272 lb 12.8 oz (123.7 kg) 07/31/21 : 273 lb (123.8 kg)    Diabetes She presents for her follow-up diabetic visit. She has type 2 diabetes mellitus. There are no hypoglycemic associated symptoms. There are no diabetic associated symptoms. Pertinent negatives for diabetes include no blurred vision, no chest pain, no polydipsia, no polyphagia, no polyuria and no weakness. There are no hypoglycemic complications. Risk factors for coronary artery disease include diabetes mellitus, obesity, hypertension and sedentary lifestyle.  Hypertension This is a chronic problem. The current episode started more than 1 year ago. The problem has been gradually improving since onset. The problem is uncontrolled. Pertinent negatives include no blurred vision, chest pain, palpitations or shortness of breath. Past treatments include angiotensin blockers and diuretics. The current treatment provides mild improvement. Compliance problems include exercise.     Past  Medical History:  Diagnosis Date   Ascending aorta dilatation (HCC)    Asthma    Chronic diastolic CHF (congestive heart failure) (HCC)    Chronic rhinitis    Cough    Dilation of pulmonary artery (HCC)    Edema, lower extremity    GERD (gastroesophageal reflux disease)    HTN (hypertension)    Mild aortic insufficiency    Morbid obesity ([place])    Murmur    Pneumonia    Pre-diabetes      Family History  Problem Relation Age of Onset   Hypertension Mother    Diabetes Mother    Heart disease Mother    Sleep apnea Mother    Obesity Mother    Hypertension Father    Anemia Father      Current Outpatient Medications:    albuterol (VENTOLIN HFA) 108 (90 Base) MCG/ACT inhaler, TAKE 2 PUFFS BY MOUTH EVERY 4 TO 6 HOURS AS NEEDED, Disp: 18 g, Rfl: 3   Azelastine-Fluticasone 137-50 MCG/ACT SUSP, PLACE 1 SPRAY INTO THE NOSE 2 (TWO) TIMES DAILY., Disp: 23 g, Rfl: 5   EPINEPHrine 0.3 mg/0.3 mL IJ SOAJ injection, SMARTSIG:0.3 Milligram(s) IM Once PRN, Disp: 1 each, Rfl: 2   famotidine (PEPCID) 20 MG tablet, TAKE 1 TABLET BY MOUTH EVERYDAY AT BEDTIME, Disp: 90 tablet, Rfl: 0   hydrALAZINE (APRESOLINE) 50 MG tablet, Take 1 tablet (50 mg total) by mouth 3 (three) times daily., Disp: 270 tablet, Rfl: 3   levocetirizine (XYZAL) 5 MG tablet, TAKE 1 TABLET BY MOUTH EVERY DAY IN THE EVENING, Disp: 90 tablet, Rfl: 0   montelukast (SINGULAIR) 10 MG tablet, TAKE 1 TABLET BY  MOUTH EVERYDAY AT BEDTIME, Disp: 90 tablet, Rfl: 0   nystatin (NYSTATIN) powder, Apply 1 application topically 3 (three) times daily., Disp: 15 g, Rfl: 0   nystatin cream (MYCOSTATIN), Apply 1 application topically 2 (two) times daily., Disp: 30 g, Rfl: 2   nystatin powder, Apply 1 application topically 2 (two) times daily as needed., Disp: 30 g, Rfl: 1   olmesartan (BENICAR) 40 MG tablet, TAKE 1 TABLET BY MOUTH EVERY DAY, Disp: 90 tablet, Rfl: 1   OZEMPIC, 1 MG/DOSE, 4 MG/3ML SOPN, INJECT $RemoveBefo'[id number]$  INTO THE SKIN ONCE A WEEK, Disp: 3 mL,  Rfl: 3   pantoprazole (PROTONIX) 40 MG tablet, Take 1 tablet by mouth daily, Disp: 180 tablet, Rfl: 0   spironolactone (ALDACTONE) 50 MG tablet, Take 1 tablet (50 mg total) by mouth daily., Disp: 90 tablet, Rfl: 3   SYMBICORT 160-4.5 MCG/ACT inhaler, TAKE 2 PUFFS BY MOUTH TWICE A DAY, Disp: 30.6 each, Rfl: 1   Allergies  Allergen Reactions   Honey Bee Treatment [Bee Venom]    Wheat Bran      Review of Systems  Constitutional: Negative.  Negative for chills and fever.  HENT:  Negative for congestion.   Eyes:  Negative for blurred vision.  Respiratory: Negative.  Negative for cough, shortness of breath and wheezing.   Cardiovascular: Negative.  Negative for chest pain and palpitations.  Gastrointestinal: Negative.   Endocrine: Negative for polydipsia, polyphagia and polyuria.  Neurological: Negative.  Negative for weakness.    Today's Vitals   10/30/21 1530  BP: 130/82  Pulse: 73  Temp: 97.8 F (36.6 C)  TempSrc: Oral  Weight: 274 lb 12.8 oz (124.6 kg)  Height: 5' 2.8" (1.595 m)   Body mass index is 48.99 kg/m.  Wt Readings from Last 3 Encounters:  10/30/21 274 lb 12.8 oz (124.6 kg)  09/17/21 272 lb 12.8 oz (123.7 kg)  07/31/21 273 lb (123.8 kg)    Objective:  Physical Exam Constitutional:      Appearance: Normal appearance. She is obese.  HENT:     Head: Normocephalic and atraumatic.  Cardiovascular:     Rate and Rhythm: Normal rate and regular rhythm.     Pulses: Normal pulses.     Heart sounds: Normal heart sounds. No murmur heard. Pulmonary:     Effort: Pulmonary effort is normal. No respiratory distress.     Breath sounds: Normal breath sounds. No wheezing.  Skin:    Capillary Refill: Capillary refill takes less than 2 seconds.  Neurological:     Mental Status: She is alert.        Assessment And Plan:     1. Type 2 diabetes mellitus without complication, without long-term current use of insulin (HCC) -Continue taking her ozempic currently at 1 mg  tolerating well without any SE  -Chronic, stable  --Discussed with patient the importance of glycemic control and long term complications from uncontrolled diabetes. Discussed with the patient the importance of compliance with home glucose monitoring, diet which includes decrease amount of sugary drinks and foods. Importance of exercise was also discussed with the patient. Importance of eye exams, self foot care and compliance to office visits was also discussed with the patient.  - Hemoglobin A1c  2. Hypertensive heart disease with chronic diastolic congestive heart failure (HCC) -Chronic, continue meds  Limit the intake of processed foods and salt intake. You should increase your intake of green vegetables and fruits. Limit the use of alcohol. Limit fast foods and fried foods.  Avoid high fatty saturated and trans fat foods. Keep yourself hydrated with drinking water. Avoid red meats. Eat lean meats instead. Exercise for atleast 30-45 min for atleast 4-5 times a week.  - CMP14+EGFR - CBC no Diff  3. Class 3 severe obesity due to excess calories with serious comorbidity and body mass index (BMI) of 45.0 to 49.9 in adult [facility]) Advised patient on a healthy diet including avoiding fast food and red meats. Increase the intake of lean meats including grilled chicken and [place].  Drink a lot of water. Decrease intake of fatty foods. Exercise for 30-45 min. 4-5 a week to decrease the risk of cardiac event.   The patient was encouraged to call or send a message through [place] for any questions or concerns.   Follow up: if symptoms persist or do not get better.   Side effects and appropriate use of all the medication(s) were discussed with the patient today. Patient advised to use the medication(s) as directed by their healthcare provider. The patient was encouraged to read, review, and understand all associated package inserts and contact our office with any questions or concerns. The patient accepts the  risks of the treatment plan and had an opportunity to ask questions.   Patient was given opportunity to ask questions. Patient verbalized understanding of the plan and was able to repeat key elements of the plan. All questions were answered to their satisfaction.  Raman Theia Dezeeuw, DNP   I, Raman Kissy Cielo have reviewed all documentation for this visit. The documentation on 10/30/20 for the exam, diagnosis, procedures, and orders are all accurate and complete.     IF YOU HAVE BEEN REFERRED TO A SPECIALIST, IT MAY TAKE 1-2 WEEKS TO SCHEDULE/PROCESS THE REFERRAL. IF YOU HAVE NOT HEARD FROM US/SPECIALIST IN TWO WEEKS, PLEASE GIVE [place] A CALL AT [phone].   THE PATIENT IS ENCOURAGED TO PRACTICE SOCIAL DISTANCING DUE TO THE COVID-19 PANDEMIC.

## 2021-10-31 ENCOUNTER — Other Ambulatory Visit: Payer: Self-pay | Admitting: Allergy

## 2021-10-31 ENCOUNTER — Encounter (HOSPITAL_BASED_OUTPATIENT_CLINIC_OR_DEPARTMENT_OTHER): Payer: Self-pay

## 2021-10-31 LAB — CMP14+EGFR
ALT: 22 IU/L (ref 0–32)
AST: 23 IU/L (ref 0–40)
Albumin/Globulin Ratio: 1.3 (ref 1.2–2.2)
Albumin: 4.1 g/dL (ref 3.8–4.9)
Alkaline Phosphatase: 92 IU/L (ref 44–121)
BUN/Creatinine Ratio: 15 (ref 9–23)
BUN: 11 mg/dL (ref 6–24)
Bilirubin Total: 0.3 mg/dL (ref 0.0–1.2)
CO2: 23 mmol/L (ref 20–29)
Calcium: 9.4 mg/dL (ref 8.7–10.2)
Chloride: 106 mmol/L (ref 96–106)
Creatinine, Ser: 0.72 mg/dL (ref 0.57–1.00)
Globulin, Total: 3.2 g/dL (ref 1.5–4.5)
Glucose: 78 mg/dL (ref 70–99)
Potassium: 5 mmol/L (ref 3.5–5.2)
Sodium: 144 mmol/L (ref 134–144)
Total Protein: 7.3 g/dL (ref 6.0–8.5)
eGFR: 100 mL/min/{1.73_m2} (ref >59)

## 2021-10-31 LAB — CBC
Hematocrit: 39.4 % (ref 34.0–46.6)
Hemoglobin: 12.6 g/dL (ref 11.1–15.9)
MCH: 25.2 pg — ABNORMAL LOW (ref 26.6–33.0)
MCHC: 32 g/dL (ref 31.5–35.7)
MCV: 79 fL (ref 79–97)
Platelets: 252 10*3/uL (ref 150–450)
RBC: 5 x10E6/uL (ref 3.77–5.28)
RDW: 14.8 % (ref 11.7–15.4)
WBC: 8.4 10*3/uL (ref 3.4–10.8)

## 2021-10-31 LAB — HEMOGLOBIN A1C
Est. average glucose Bld gHb Est-mCnc: 134 mg/dL
Hgb A1c MFr Bld: 6.3 % — ABNORMAL HIGH (ref 4.8–5.6)

## 2021-11-07 ENCOUNTER — Other Ambulatory Visit: Payer: Self-pay

## 2021-11-07 ENCOUNTER — Ambulatory Visit (INDEPENDENT_AMBULATORY_CARE_PROVIDER_SITE_OTHER): Payer: BC Managed Care – PPO | Admitting: Allergy

## 2021-11-07 ENCOUNTER — Encounter: Payer: Self-pay | Admitting: Allergy

## 2021-11-07 VITALS — BP 150/78 | HR 80 | Temp 97.3°F | Resp 18 | Ht 63.1 in | Wt 269.0 lb

## 2021-11-07 DIAGNOSIS — H1013 Acute atopic conjunctivitis, bilateral: Secondary | ICD-10-CM | POA: Diagnosis not present

## 2021-11-07 DIAGNOSIS — T7800XA Anaphylactic reaction due to unspecified food, initial encounter: Secondary | ICD-10-CM

## 2021-11-07 DIAGNOSIS — J3089 Other allergic rhinitis: Secondary | ICD-10-CM | POA: Diagnosis not present

## 2021-11-07 DIAGNOSIS — J0100 Acute maxillary sinusitis, unspecified: Secondary | ICD-10-CM

## 2021-11-07 DIAGNOSIS — J4540 Moderate persistent asthma, uncomplicated: Secondary | ICD-10-CM | POA: Diagnosis not present

## 2021-11-07 MED ORDER — BREZTRI AEROSPHERE 160-9-4.8 MCG/ACT IN AERO: 2.0000 | INHALATION_SPRAY | Freq: Two times a day (BID) | RESPIRATORY_TRACT | 5 refills | Status: DC

## 2021-11-07 MED ORDER — AMOXICILLIN-POT CLAVULANATE 875-125 MG PO TABS: 1.0000 | ORAL_TABLET | Freq: Two times a day (BID) | ORAL | 0 refills | Status: AC

## 2021-11-07 MED ORDER — AZELASTINE-FLUTICASONE 137-50 MCG/ACT NA SUSP: 1.0000 | Freq: Two times a day (BID) | NASAL | 5 refills | Status: DC

## 2021-11-07 NOTE — Patient Instructions (Addendum)
Acute sinus infection   - current symptoms with sinus pressure/pain, increased nasal drainage, coughing not improving > 1 week concerned for sinus infection and will treat with course of Augmentin 875mg  1 tab twice a day for 10 days   - can continue warm compresses as well as teas to help soothe throat  Cough variant asthma  - stop symbicort for now  - take Breztri 2 puffs twice a day and use with spacer device.  Sample provided today.  Will send in prescription  - have access to albuterol inhaler 2 puffs every 4-6 hours as needed for cough/wheeze/shortness of breath/chest tightness.  May use 15-20 minutes prior to activity.   Monitor frequency of use.    - continue Singulair 10mg  daily at bedtime  - if you continue to have symptoms despite breztri use then will need to consider stepping up to a asthma biologic injectable medication  Asthma control goals:  Full participation in all desired activities (may need albuterol before activity) Albuterol use two time or less a week on average (not counting use with activity) Cough interfering with sleep two time or less a month Oral steroids no more than once a year No hospitalizations  Allergies   - continue allergen avoidance measures for tree pollen and mold  - continue Xyzal 5mg  daily.    - continue singulair as above  - continue Flonase + Astelin (nasal antihistamine) 1 spray each nostril twice a day.  This helps with both nasal congestion and drainage.   - recommend use of nasal saline rinses to to help keep nose/sinuses flushed out of any pollen, germs, particles.  Use rinse with either distilled water or boil water and bring to room temperate.  Breathe thru your mouth while performing rinse.    Food allergy vs intolerance  - you have had serum IgE detectable levels to wheat and honey however they are low  - skin testing for food allergens has been negative to all foods  - continue avoidance of honey and wheat in the diet  - have access  to self-injectable epinephrine (Epipen or AuviQ) 0.3mg  at all times  - follow emergency action plan in case of allergic reaction  Follow-up 3 months or sooner if needed

## 2021-11-07 NOTE — Progress Notes (Signed)
Follow-up Note  RE: Jessica Snow MRN: [id number] DOB: 12/11/1965 Date of Office Visit: 11/07/2021   History of present illness: Jessica Snow is a 56 y.o. female presenting today for follow-up of cough variant asthma, allergic rhinitis, food allergy versus intolerance. She was last seen in the office on 03/08/2021 myself.  She states she had a sinus infection around Thanksgiving time.  She was doing well over Christmas holidays.  However when she went back to work states her sinus symptoms seem to return.   She reports developing sinus pressure last week that was very uncomfortable.  She states the pressure goes from her cheek area to her ear and then down the neck.  She also states her throat is itching and she is needing to clear her throat often.  She also is reporting nasal congestion and has been blowing out a yellowish thick mucus.  She is also having increased cough with these symptoms. Symptoms are lingering and not really improving.  No fevers, night sweats or chills.  She has been using warm compresses thus far over her face.  Also has used peppermint oil and also drinking herbal tea that she states helps with her cough.    She states since Thanksgiving she does feel she has been using her albuterol more and reports albuterol use about once a day during work week.  She ran out of the Breztri samples after last visit and she is back on Symbicort.  She does feel the Jessica Snow was better able to control symptoms than the Symbicort.  She does states she wants to be more active but activity causes her to have increase shortness of breath.    She still avoids honey and wheat products.  She has not required epinephrine use.  Review of systems: Review of Systems  Constitutional: Negative.   HENT: Negative.    Eyes: Negative.   Respiratory: Negative.    Cardiovascular: Negative.   Gastrointestinal: Negative.   Musculoskeletal: Negative.   Skin: Negative.   Allergic/Immunologic:  Negative.   Neurological: Negative.     All other systems negative unless noted above in HPI  Past medical/social/surgical/family history have been reviewed and are unchanged unless specifically indicated below.  No changes  Medication List: Current Outpatient Medications  Medication Sig Dispense Refill   albuterol (VENTOLIN HFA) 108 (90 Base) MCG/ACT inhaler TAKE 2 PUFFS BY MOUTH EVERY 4 TO 6 HOURS AS NEEDED 18 g 3   amoxicillin-clavulanate (AUGMENTIN) 875-125 MG tablet Take 1 tablet by mouth 2 (two) times daily for 10 days. 20 tablet 0   Azelastine-Fluticasone 137-50 MCG/ACT SUSP Place 1 spray into the nose 2 (two) times daily. 23 g 5   Budeson-Glycopyrrol-Formoterol (BREZTRI AEROSPHERE) 160-9-4.8 MCG/ACT AERO Inhale 2 puffs into the lungs in the morning and at bedtime. 10.7 g 5   EPINEPHrine 0.3 mg/0.3 mL IJ SOAJ injection SMARTSIG:0.3 Milligram(s) IM Once PRN 1 each 2   famotidine (PEPCID) 20 MG tablet TAKE 1 TABLET BY MOUTH EVERYDAY AT BEDTIME 90 tablet 0   levocetirizine (XYZAL) 5 MG tablet TAKE 1 TABLET BY MOUTH EVERY DAY IN THE EVENING 90 tablet 0   montelukast (SINGULAIR) 10 MG tablet TAKE 1 TABLET BY MOUTH EVERYDAY AT BEDTIME 90 tablet 0   nystatin (NYSTATIN) powder Apply 1 application topically 3 (three) times daily. 15 g 0   nystatin cream (MYCOSTATIN) Apply 1 application topically 2 (two) times daily. 30 g 2   Jessica Snow, 1 MG/DOSE, 4 MG/3ML SOPN INJECT 1MG  INTO  THE SKIN ONCE A WEEK 3 mL 3   pantoprazole (PROTONIX) 40 MG tablet Take 1 tablet by mouth daily 180 tablet 0   spironolactone (ALDACTONE) 50 MG tablet Take 1 tablet (50 mg total) by mouth daily. 90 tablet 3   hydrALAZINE (APRESOLINE) 50 MG tablet Take 1 tablet (50 mg total) by mouth 3 (three) times daily. 270 tablet 3   No current facility-administered medications for this visit.     Known medication allergies: Allergies  Allergen Reactions   Honey Bee Treatment [Bee Venom]    Wheat Bran      Physical  examination: Blood pressure (!) 150/78, pulse 80, temperature (!) 97.3 F (36.3 C), temperature source Temporal, resp. rate 18, height 5' 3.1" (1.603 m), weight 269 lb (122 kg), last menstrual period 12/19/2005.  General: Alert, interactive, in no acute distress. HEENT: PERRLA, TMs pearly gray, turbinates moderately edematous with crusty discharge, post-pharynx non erythematous. Neck: Supple without lymphadenopathy. Lungs: Mildly decreased breath sounds bilaterally without wheezing, rhonchi or rales. {no increased work of breathing. CV: Normal S1, S2 without murmurs. Abdomen: Nondistended, nontender. Skin: Warm and dry, without lesions or rashes. Extremities:  No clubbing, cyanosis or edema. Neuro:   Grossly intact.  Diagnositics/Labs:  Spirometry: FEV1: 1.14 L 51%, FVC: 1.48 L 53%, ratio consistent with restrictive pattern.  This is actually an improved study from her previous   Assessment and plan:   Acute sinus infection   - current symptoms with sinus pressure/pain, increased nasal drainage, coughing not improving > 1 week concerned for sinus infection and will treat with course of Augmentin 875mg  1 tab twice a day for 10 days   - can continue warm compresses as well as teas to help soothe throat  Cough variant asthma  - stop symbicort for now  - take Breztri 2 puffs twice a day and use with spacer device.  Sample provided today.  Will send in prescription  - have access to albuterol inhaler 2 puffs every 4-6 hours as needed for cough/wheeze/shortness of breath/chest tightness.  May use 15-20 minutes prior to activity.   Monitor frequency of use.    - continue Singulair 10mg  daily at bedtime  - if you continue to have symptoms despite breztri use then will need to consider stepping up to a asthma biologic injectable medication  Asthma control goals:  Full participation in all desired activities (may need albuterol before activity) Albuterol use two time or less a week on average  (not counting use with activity) Cough interfering with sleep two time or less a month Oral steroids no more than once a year No hospitalizations  Allergic rhinitis  - continue allergen avoidance measures for tree pollen and mold  - continue Xyzal 5mg  daily.    - continue singulair as above  - continue Flonase + Astelin (nasal antihistamine) 1 spray each nostril twice a day.  This helps with both nasal congestion and drainage.   - recommend use of nasal saline rinses to to help keep nose/sinuses flushed out of any pollen, germs, particles.  Use rinse with either distilled water or boil water and bring to room temperate.  Breathe thru your mouth while performing rinse.    Food allergy vs intolerance  - you have had serum IgE detectable levels to wheat and honey however they are low  - skin testing for food allergens has been negative to all foods  - continue avoidance of honey and wheat in the diet  - have access to self-injectable epinephrine (  Epipen or AuviQ) 0.3mg  at all times  - follow emergency action plan in case of allergic reaction  Follow-up 3 months or sooner if needed  I appreciate the opportunity to take part in Harman's care. Please do not hesitate to contact me with questions.  Sincerely,   Margo AyeShaylar Merlyn Conley, MD Allergy/Immunology [facility]

## 2021-12-05 NOTE — Progress Notes (Signed)
Cardiology Office Note:    Date:  12/10/2021   ID:  Jessica Snow, DOB 1966-04-03, MRN [id number]  PCP:  Dorothyann Peng, MD   [facility] Providers Cardiologist:  Meriam Sprague, MD Sleep Medicine:  Armanda Magic, MD {   Referring MD: Dorothyann Peng, MD     History of Present Illness:    Jessica Snow is a 56 y.o. female with a hx of  chronic diastolic heart failure, hypertension, obesity, dilated ascending thoracic aorta, syncope, OSA who was previously followed by Dr. Delton See who presents to clinic for follow-up.  Previous CTA 2019 with coronary calcium score of 0, mild AI, ascending aortic dilation 19mm by CTA 2020.   She was seen in the ED 07/22/21 after syncopal episode in church. Troponin went from 12 to 18 but was presumed due to hypertension or CHF. She was provided dose of Lasix.   Last seen in clinic 07/31/21 where she was doing well. Had some atypical chest pain. No further syncope. Cardiac monitor with NSR with rare PVCs, 1 episode of NSVT 7 beats.   Today, the patient states that she overall feels well. No further syncope. Has occasional cramping in her legs and hands.  Blood pressure is elevated 140/80s. No chest pain, SOB, orthopnea, PND. No significant palpitations. Working to lose weight and is currently on ozempic.   Past Medical History:  Diagnosis Date   Ascending aorta dilatation (HCC)    Asthma    Chronic diastolic CHF (congestive heart failure) (HCC)    Chronic rhinitis    Cough    Dilation of pulmonary artery (HCC)    Edema, lower extremity    GERD (gastroesophageal reflux disease)    HTN (hypertension)    Mild aortic insufficiency    Morbid obesity (HCC)    Murmur    Pneumonia    Pre-diabetes     Past Surgical History:  Procedure Laterality Date   ANKLE RECONSTRUCTION     TONSILLECTOMY     TOTAL VAGINAL HYSTERECTOMY  2007    Current Medications: Current Meds  Medication Sig   albuterol (VENTOLIN HFA) 108 (90 Base) MCG/ACT  inhaler TAKE 2 PUFFS BY MOUTH EVERY 4 TO 6 HOURS AS NEEDED   amLODipine (NORVASC) 5 MG tablet Take 1 tablet (5 mg total) by mouth daily.   Azelastine-Fluticasone 137-50 MCG/ACT SUSP Place 1 spray into the nose 2 (two) times daily.   Budeson-Glycopyrrol-Formoterol (BREZTRI AEROSPHERE) 160-9-4.8 MCG/ACT AERO Inhale 2 puffs into the lungs in the morning and at bedtime.   EPINEPHrine 0.3 mg/0.3 mL IJ SOAJ injection SMARTSIG:0.3 Milligram(s) IM Once PRN   famotidine (PEPCID) 20 MG tablet TAKE 1 TABLET BY MOUTH EVERYDAY AT BEDTIME   levocetirizine (XYZAL) 5 MG tablet TAKE 1 TABLET BY MOUTH EVERY DAY IN THE EVENING   montelukast (SINGULAIR) 10 MG tablet TAKE 1 TABLET BY MOUTH EVERYDAY AT BEDTIME   nystatin (NYSTATIN) powder Apply 1 application topically 3 (three) times daily.   nystatin cream (MYCOSTATIN) Apply 1 application topically 2 (two) times daily.   OZEMPIC, 1 MG/DOSE, 4 MG/3ML SOPN INJECT 1MG  INTO THE SKIN ONCE A WEEK   pantoprazole (PROTONIX) 40 MG tablet Take 1 tablet by mouth daily   spironolactone (ALDACTONE) 50 MG tablet Take 1 tablet (50 mg total) by mouth daily.     Allergies:   Honey bee treatment [bee venom] and Wheat bran   Social History   Socioeconomic History   Marital status: Single    Spouse name: Not on  file   Number of children: Not on file   Years of education: Not on file   Highest education level: Not on file  Occupational History   Occupation: Youth worker for [employer]    Employer: [employer]  Tobacco Use   Smoking status: Former    Packs/day: 0.25    Years: 10.00    Pack years: 2.50    Types: Cigarettes    Quit date: 01/26/2018    Years since quitting: 3.8   Smokeless tobacco: Former  Building services engineer Use: Never used  Substance and Sexual Activity   Alcohol use: No   Drug use: No   Sexual activity: Never  Other Topics Concern   Not on file  Social History Narrative   Lives with two children.     Social Determinants of Health   Financial Resource  Strain: Not on file  Food Insecurity: Not on file  Transportation Needs: Not on file  Physical Activity: Not on file  Stress: Not on file  Social Connections: Not on file     Family History: The patient's family history includes Anemia in her father; Diabetes in her mother; Heart disease in her mother; Hypertension in her father and mother; Obesity in her mother; Sleep apnea in her mother.  ROS:   Please see the history of present illness.    Review of Systems  Constitutional:  Negative for chills and fever.  Respiratory:  Negative for sputum production.   Cardiovascular:  Positive for leg swelling. Negative for chest pain, palpitations, orthopnea, claudication and PND.  Neurological:  Negative for dizziness and loss of consciousness.    EKGs/Labs/Other Studies Reviewed:    The following studies were reviewed today: TTE 09/10/2021: IMPRESSIONS     1. Left ventricular ejection fraction, by estimation, is 65 to 70%. The  left ventricle has normal function. The left ventricle has no regional  wall motion abnormalities. Left ventricular diastolic parameters were  normal.   2. Right ventricular systolic function is normal. The right ventricular  size is normal. There is normal pulmonary artery systolic pressure.   3. The mitral valve is normal in structure. No evidence of mitral valve  regurgitation.   4. The aortic valve is normal in structure. Aortic valve regurgitation is  not visualized. No aortic stenosis is present.   Comparison(s): 11/03/20 EF 55-60%. Ascending aorta 76mm.   Conclusion(s)/Recommendation(s): Normal study.   Cardiac Monitor 07/2021: Predominant rhythm showed normal sinus rhythm with average heart rate 76 bpm. The heart rate ranged from 50 to 125 bpm. Rare PVCs with PVC load less than 1% PACs and nonsustained atrial tachycardia with longest run lasting 7 beats at 171 bpm     Patch Wear Time:  9 days and 20 hours (2022-10-04T10:08:01-399 to  2022-10-14T06:14:35-0400)   Patient had a min HR of 50 bpm, max HR of 171 bpm, and avg HR of 76 bpm. Predominant underlying rhythm was Sinus Rhythm. 2 Supraventricular Tachycardia runs occurred, the run with the fastest interval lasting 7 beats with a max rate of 171 bpm (avg 151  bpm); the run with the fastest interval was also the longest. Isolated SVEs were rare (<1.0%), SVE Couplets were rare (<1.0%), and SVE Triplets were rare (<1.0%). Isolated VEs were rare (<1.0%), and no VE Couplets or VE Triplets were present  MRI 08/10/21: FINDINGS: 1. Normal left ventricular size, with LVEDD 55 mm, and LVEDVi 77 mL/m2.   Normal left ventricular thickness, with intraventricular septal thickness of 12 mm, posterior  wall thickness of 6 mm, and myocardial mass index of 51 g/m2.   Normal left ventricular systolic function (LVEF =56%). There are no regional wall motion abnormalities.   Left ventricular parametric mapping notable for normal ECV and T2 signal.   There is no late gadolinium enhancement in the left ventricular myocardium.   2. Normal right ventricular size with RVEDVI 70 mL/m2.   Normal right ventricular thickness.   Normal right ventricular systolic function (RVEF = 56%). There are no regional wall motion abnormalities or aneurysms.   3.  Normal left and right atrial size.   4. Aortic MRA: The great vessels arose normally from the arch.   There was no evidence of coarctation.   The following measurements were obtained:   Sinus of Valsalva: 26 mm   Sinotubular Junction: 25 mm   Ascending Aorta: 39 mm; this is at the upper limits of normal for age and body surface area.   Aortic Arch: 25 mm   Descending Thoracic Aorta: 25 mm   Moderate to severe dilation of the main pulmonary artery 35 mm with trunk to aorta ratio of 0.9. This can be associated with the presence of pulmonary hypertension; clinical correlation advised.   5.  No significant valvular abnormalities.    Tri-leaflet aortic valve.   No significant different in LV and RV stroke volume.   6. Normal pericardium. No pericardial effusion. Prominent pericardial fat anterior the right ventricle   7. Grossly, no extracardiac findings. Recommended dedicated study if concerned for non-cardiac pathology.   IMPRESSION: Ascending Aorta is at the upper limit of normal.   Moderate to severe dilation of the main pulmonary artery 35 mm with trunk to aorta ratio of 0.9. This can be associated with the presence of pulmonary hypertension.  EKG:  No new tracing  Recent Labs: 10/30/2021: ALT 22; BUN 11; Creatinine, Ser 0.72; Hemoglobin 12.6; Platelets 252; Potassium 5.0; Sodium 144  Recent Lipid Panel    Component Value Date/Time   CHOL 166 07/23/2021 1659   TRIG 129 07/23/2021 1659   HDL 47 07/23/2021 1659   CHOLHDL 3.5 07/23/2021 1659   LDLCALC 96 07/23/2021 1659     Risk Assessment/Calculations:           Physical Exam:    VS:  BP (!) 142/84    Pulse 75    Ht 5' 3.1" (1.603 m)    Wt 269 lb 9.6 oz (122.3 kg)    LMP 12/19/2005    SpO2 96%    BMI 47.61 kg/m     Wt Readings from Last 3 Encounters:  12/10/21 269 lb 9.6 oz (122.3 kg)  11/07/21 269 lb (122 kg)  10/30/21 274 lb 12.8 oz (124.6 kg)     GEN:  Well nourished, well developed in no acute distress HEENT: Normal NECK: No JVD; No carotid bruits CARDIAC: RRR, no murmurs, rubs, gallops RESPIRATORY:  Clear to auscultation without rales, wheezing or rhonchi  ABDOMEN: Soft, non-tender, non-distended MUSCULOSKELETAL:  No edema; No deformity  SKIN: Warm and dry NEUROLOGIC:  Alert and oriented x 3 PSYCHIATRIC:  Normal affect   ASSESSMENT:    No diagnosis found. PLAN:    In order of problems listed above:  #Syncope: Had episode of syncope at church in the setting of not eating breakfast of not eating. Echo 11/03/20 normal LVEF, no significant valvular abnormalities, mildly dilated ascending aorta 40mm. Cardiac monitor with rare  ectopy with nonsustained atach episode lasting 7 beats. Otherwise no significant changes. -Continue to  monitor -Maintain good hydration  #Chronic Diastolic HF: Doing well and euvolemic. -Continue spironolactone 50mg  daily -Not on lasix and euvolemic -BP control as below -Can add farxiga at next visit  #HTN: Elevated 140s at home. -Continue hydralazine 50mg  TID -Continue spironolactone 50mg  daily -Start amlodipine 5mg  daily  #OSA -Continue CPAP -Follow-up with Armanda Magic as scheduled  #Thoracic Aortic Aneurysm Measures 64mm on MRI 07/2021. -Continue yearly monitoring          Medication Adjustments/Labs and Tests Ordered: Current medicines are reviewed at length with the patient today.  Concerns regarding medicines are outlined above.  No orders of the defined types were placed in this encounter.  Meds ordered this encounter  Medications   amLODipine (NORVASC) 5 MG tablet    Sig: Take 1 tablet (5 mg total) by mouth daily.    Dispense:  90 tablet    Refill:  3    Patient Instructions  Medication Instructions:  Your physician has recommended you make the following change in your medication:   START Amlodipine 5 mg taking 1 daily   *If you need a refill on your cardiac medications before your next appointment, please call your pharmacy*   Lab Work: None ordered  If you have labs (blood work) drawn today and your tests are completely normal, you will receive your results only by: MyChart Message (if you have MyChart) OR A paper copy in the mail If you have any lab test that is abnormal or we need to change your treatment, we will call you to review the results.   Testing/Procedures:  None ordered   Follow-Up: At [facility], you and your health needs are our priority.  As part of our continuing mission to provide you with exceptional heart care, we have created designated Provider Care Teams.  These Care Teams include your primary Cardiologist  (physician) and Advanced Practice Providers (APPs -  Physician Assistants and Nurse Practitioners) who all work together to provide you with the care you need, when you need it.  We recommend signing up for the patient portal called "MyChart".  Sign up information is provided on this After Visit Summary.  MyChart is used to connect with patients for Virtual Visits (Telemedicine).  Patients are able to view lab/test results, encounter notes, upcoming appointments, etc.  Non-urgent messages can be sent to your provider as well.   To learn more about what you can do with MyChart, go to [email or web address].    Your next appointment:   6 month(s)  The format for your next appointment:   In Person  Provider:   Meriam Sprague, MD     Other Instructions     Signed, Meriam Sprague, MD  12/10/2021 5:35 PM    [facility]

## 2021-12-07 ENCOUNTER — Other Ambulatory Visit: Payer: Self-pay | Admitting: Allergy

## 2021-12-10 ENCOUNTER — Encounter: Payer: Self-pay | Admitting: Cardiology

## 2021-12-10 ENCOUNTER — Other Ambulatory Visit: Payer: Self-pay

## 2021-12-10 ENCOUNTER — Ambulatory Visit: Payer: BC Managed Care – PPO | Admitting: Cardiology

## 2021-12-10 VITALS — BP 142/84 | HR 75 | Ht 63.1 in | Wt 269.6 lb

## 2021-12-10 DIAGNOSIS — I10 Essential (primary) hypertension: Secondary | ICD-10-CM | POA: Diagnosis not present

## 2021-12-10 DIAGNOSIS — I5032 Chronic diastolic (congestive) heart failure: Secondary | ICD-10-CM | POA: Diagnosis not present

## 2021-12-10 DIAGNOSIS — I77810 Thoracic aortic ectasia: Secondary | ICD-10-CM | POA: Diagnosis not present

## 2021-12-10 DIAGNOSIS — Z9989 Dependence on other enabling machines and devices: Secondary | ICD-10-CM

## 2021-12-10 DIAGNOSIS — R55 Syncope and collapse: Secondary | ICD-10-CM

## 2021-12-10 DIAGNOSIS — G4733 Obstructive sleep apnea (adult) (pediatric): Secondary | ICD-10-CM | POA: Diagnosis not present

## 2021-12-10 MED ORDER — AMLODIPINE BESYLATE 5 MG PO TABS: 5.0000 mg | ORAL_TABLET | Freq: Every day | ORAL | 3 refills | Status: DC

## 2021-12-10 NOTE — Patient Instructions (Signed)
Medication Instructions:  Your physician has recommended you make the following change in your medication:   START Amlodipine 5 mg taking 1 daily   *If you need a refill on your cardiac medications before your next appointment, please call your pharmacy*   Lab Work: None ordered  If you have labs (blood work) drawn today and your tests are completely normal, you will receive your results only by: MyChart Message (if you have MyChart) OR A paper copy in the mail If you have any lab test that is abnormal or we need to change your treatment, we will call you to review the results.   Testing/Procedures:  None ordered   Follow-Up: At [facility], you and your health needs are our priority.  As part of our continuing mission to provide you with exceptional heart care, we have created designated Provider Care Teams.  These Care Teams include your primary Cardiologist (physician) and Advanced Practice Providers (APPs -  Physician Assistants and Nurse Practitioners) who all work together to provide you with the care you need, when you need it.  We recommend signing up for the patient portal called "MyChart".  Sign up information is provided on this After Visit Summary.  MyChart is used to connect with patients for Virtual Visits (Telemedicine).  Patients are able to view lab/test results, encounter notes, upcoming appointments, etc.  Non-urgent messages can be sent to your provider as well.   To learn more about what you can do with MyChart, go to [email or web address].    Your next appointment:   6 month(s)  The format for your next appointment:   In Person  Provider:   Meriam Sprague, MD     Other Instructions

## 2022-02-05 ENCOUNTER — Encounter: Payer: Self-pay | Admitting: Nurse Practitioner

## 2022-02-05 ENCOUNTER — Other Ambulatory Visit: Payer: Self-pay

## 2022-02-05 ENCOUNTER — Ambulatory Visit (INDEPENDENT_AMBULATORY_CARE_PROVIDER_SITE_OTHER): Payer: BC Managed Care – PPO | Admitting: Nurse Practitioner

## 2022-02-05 VITALS — BP 128/76 | HR 74 | Temp 98.7°F | Ht 63.1 in | Wt 269.6 lb

## 2022-02-05 DIAGNOSIS — Z1231 Encounter for screening mammogram for malignant neoplasm of breast: Secondary | ICD-10-CM

## 2022-02-05 DIAGNOSIS — M546 Pain in thoracic spine: Secondary | ICD-10-CM

## 2022-02-05 DIAGNOSIS — I5032 Chronic diastolic (congestive) heart failure: Secondary | ICD-10-CM

## 2022-02-05 DIAGNOSIS — E1169 Type 2 diabetes mellitus with other specified complication: Secondary | ICD-10-CM | POA: Diagnosis not present

## 2022-02-05 DIAGNOSIS — Z6841 Body Mass Index (BMI) 40.0 and over, adult: Secondary | ICD-10-CM

## 2022-02-05 DIAGNOSIS — Z23 Encounter for immunization: Secondary | ICD-10-CM

## 2022-02-05 DIAGNOSIS — E7800 Pure hypercholesterolemia, unspecified: Secondary | ICD-10-CM

## 2022-02-05 DIAGNOSIS — I110 Hypertensive heart disease with heart failure: Secondary | ICD-10-CM | POA: Diagnosis not present

## 2022-02-05 DIAGNOSIS — E119 Type 2 diabetes mellitus without complications: Secondary | ICD-10-CM

## 2022-02-05 LAB — POCT URINALYSIS DIPSTICK
Bilirubin, UA: NEGATIVE
Blood, UA: NEGATIVE
Glucose, UA: NEGATIVE
Ketones, UA: NEGATIVE
Leukocytes, UA: NEGATIVE
Nitrite, UA: NEGATIVE
Protein, UA: NEGATIVE
Spec Grav, UA: 1.02 (ref 1.010–1.025)
Urobilinogen, UA: 1 E.U./dL
pH, UA: 6 (ref 5.0–8.0)

## 2022-02-05 MED ORDER — SPIRONOLACTONE 50 MG PO TABS: 50.0000 mg | ORAL_TABLET | Freq: Every day | ORAL | 3 refills | Status: DC

## 2022-02-05 NOTE — Progress Notes (Signed)
?[employer] as a Neurosurgeon for [employer], FNP.,have documented all relevant documentation on the behalf of Arnette Felts, FNP,as directed by  Arnette Felts, FNP while in the presence of Arnette Felts, FNP.  ? ?This visit occurred during the SARS-CoV-2 public health emergency.  Safety protocols were in place, including screening questions prior to the visit, additional usage of staff PPE, and extensive cleaning of exam room while observing appropriate contact time as indicated for disinfecting solutions. ? ?Subjective:  ?  ? Patient ID: Jessica Snow , female    DOB: 24-Jun-1966 , 56 y.o.   MRN: [id number] ? ? ?Chief Complaint  ?Patient presents with  ?? Annual Exam  ? ? ?HPI ? ?She is here today for a full physical examination. She is no longer followed by GYN. She is s/p hysterectomy due to fibroid tumors. She is taking ozempic1 mg, she noticed she is not breathing as heavy when walking longer distances ? ?Wt Readings from Last 3 Encounters: ?02/05/22 : 269 lb 9.6 oz (122.3 kg) ?12/10/21 : 269 lb 9.6 oz (122.3 kg) ?11/07/21 : 269 lb (122 kg) ? ? ? ?Hypertension ?This is a chronic problem. The current episode started more than 1 year ago. The problem is uncontrolled. Pertinent negatives include no blurred vision, chest pain, palpitations or shortness of breath. Past treatments include diuretics and angiotensin blockers. The current treatment provides moderate improvement. Compliance problems include exercise.    ? ?Past Medical History:  ?Diagnosis Date  ?? Ascending aorta dilatation (HCC)   ?? Asthma   ?? Chronic diastolic CHF (congestive heart failure) (HCC)   ?? Chronic rhinitis   ?? Cough   ?? Dilation of pulmonary artery (HCC)   ?? Edema, lower extremity   ?? GERD (gastroesophageal reflux disease)   ?? HTN (hypertension)   ?? Mild aortic insufficiency   ?? Morbid obesity (HCC)   ?? Murmur   ?? Pneumonia   ?? Pre-diabetes   ?  ? ?Family History  ?Problem Relation Age of Onset  ?? Hypertension Mother    ?? Diabetes Mother   ?? Heart disease Mother   ?? Sleep apnea Mother   ?? Obesity Mother   ?? Hypertension Father   ?? Anemia Father   ? ? ? ?Current Outpatient Medications:  ??  albuterol (VENTOLIN HFA) 108 (90 Base) MCG/ACT inhaler, TAKE 2 PUFFS BY MOUTH EVERY 4 TO 6 HOURS AS NEEDED, Disp: 18 g, Rfl: 3 ??  amLODipine (NORVASC) 5 MG tablet, Take 1 tablet (5 mg total) by mouth daily., Disp: 90 tablet, Rfl: 3 ??  famotidine (PEPCID) 20 MG tablet, TAKE 1 TABLET BY MOUTH EVERYDAY AT BEDTIME, Disp: 90 tablet, Rfl: 0 ??  montelukast (SINGULAIR) 10 MG tablet, TAKE 1 TABLET BY MOUTH EVERYDAY AT BEDTIME (Patient not taking: Reported on 02/08/2022), Disp: 90 tablet, Rfl: 0 ??  nystatin (NYSTATIN) powder, Apply 1 application topically 3 (three) times daily., Disp: 15 g, Rfl: 0 ??  nystatin cream (MYCOSTATIN), Apply 1 application topically 2 (two) times daily. (Patient not taking: Reported on 02/08/2022), Disp: 30 g, Rfl: 2 ??  OZEMPIC, 1 MG/DOSE, 4 MG/3ML SOPN, INJECT 1MG  INTO THE SKIN ONCE A WEEK, Disp: 3 mL, Rfl: 3 ??  pantoprazole (PROTONIX) 40 MG tablet, Take 1 tablet by mouth daily, Disp: 180 tablet, Rfl: 0 ??  Azelastine-Fluticasone 137-50 MCG/ACT SUSP, Place 1 spray into the nose 2 (two) times daily., Disp: 23 g, Rfl: 5 ??  Budeson-Glycopyrrol-Formoterol (BREZTRI AEROSPHERE) 160-9-4.8 MCG/ACT AERO, Inhale 2 puffs into the  lungs in the morning and at bedtime., Disp: 10.7 g, Rfl: 5 ??  EPINEPHrine 0.3 mg/0.3 mL IJ SOAJ injection, SMARTSIG:0.3 Milligram(s) IM Once PRN, Disp: 1 each, Rfl: 2 ??  hydrALAZINE (APRESOLINE) 50 MG tablet, Take 50 mg by mouth 3 (three) times daily., Disp: , Rfl:  ??  levocetirizine (XYZAL) 5 MG tablet, TAKE 1 TABLET BY MOUTH EVERY DAY IN THE EVENING, Disp: 90 tablet, Rfl: 1 ??  Olopatadine HCl 0.2 % SOLN, Apply 1 drop to eye daily., Disp: 2.5 mL, Rfl: 5 ??  spironolactone (ALDACTONE) 50 MG tablet, Take 1 tablet (50 mg total) by mouth daily., Disp: 90 tablet, Rfl: 3  ? ?Allergies  ?Allergen  Reactions  ?? Honey Bee Treatment [Bee Venom]   ?? Wheat Bran   ?  ? ? ?The patient states she is status post hysterectomy  Patient's last menstrual period was 12/19/2005..  Negative for: breast discharge, breast lump(s), breast pain and breast self exam. Associated symptoms include abnormal vaginal bleeding. Pertinent negatives include abnormal bleeding (hematology), anxiety, decreased libido, depression, difficulty falling sleep, dyspareunia, history of infertility, nocturia, sexual dysfunction, sleep disturbances, urinary incontinence, urinary urgency, vaginal discharge and vaginal itching. Diet regular; tries to avoid fried foods. 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  ? ?The patient's tobacco use is:  ?Social History  ? ?Tobacco Use  ?Smoking Status Former  ?? Packs/day: 0.25  ?? Years: 10.00  ?? Pack years: 2.50  ?? Types: Cigarettes  ?? Quit date: 01/26/2018  ?? Years since quitting: 4.0  ?Smokeless Tobacco Former  ? ?She has been exposed to passive smoke. The patient's alcohol use is:  ?Social History  ? ?Substance and Sexual Activity  ?Alcohol Use No  ? ? ?Review of Systems  ?Constitutional: Negative.   ?HENT: Negative.    ?Eyes: Negative.  Negative for blurred vision.  ?Respiratory: Negative.  Negative for shortness of breath.   ?Cardiovascular: Negative.  Negative for chest pain and palpitations.  ?Gastrointestinal: Negative.   ?Endocrine: Negative.   ?Genitourinary: Negative.   ?Musculoskeletal:  Positive for back pain.  ?Skin: Negative.   ?Allergic/Immunologic: Negative.   ?Neurological: Negative.   ?Hematological: Negative.   ?Psychiatric/Behavioral: Negative.     ? ?Today's Vitals  ? 02/05/22 1537  ?BP: 128/76  ?Pulse: 74  ?Temp: 98.7 ?F (37.1 ?C)  ?Weight: 269 lb 9.6 oz (122.3 kg)  ?Height: 5' 3.1" (1.603 m)  ? ?Body mass index is 47.61 kg/m?.  ?Wt Readings from Last 3 Encounters:  ?02/08/22 270 lb 12.8 oz (122.8 kg)  ?02/05/22 269 lb  9.6 oz (122.3 kg)  ?12/10/21 269 lb 9.6 oz (122.3 kg)  ?  ?BP Readings from Last 3 Encounters:  ?02/08/22 124/90  ?02/05/22 128/76  ?12/10/21 (!) 142/84  ?  ?Objective:  ?Physical Exam ?Vitals reviewed.  ?Constitutional:   ?   General: She is not in acute distress. ?   Appearance: Normal appearance. She is well-developed. She is obese.  ?HENT:  ?   Head: Normocephalic and atraumatic.  ?   Right Ear: Hearing, tympanic membrane, ear canal and external ear normal. There is no impacted cerumen.  ?   Left Ear: Hearing, tympanic membrane, ear canal and external ear normal. There is no impacted cerumen.  ?   Nose:  ?   Comments: Deferred - masked ?   Mouth/Throat:  ?   Comments: Deferred - masked ?Eyes:  ?   General: Lids  are normal.  ?   Extraocular Movements: Extraocular movements intact.  ?   Conjunctiva/sclera: Conjunctivae normal.  ?   Pupils: Pupils are equal, round, and reactive to light.  ?   Funduscopic exam: ?   Right eye: No papilledema.     ?   Left eye: No papilledema.  ?Neck:  ?   Thyroid: No thyroid mass.  ?   Vascular: No carotid bruit.  ?Cardiovascular:  ?   Rate and Rhythm: Normal rate and regular rhythm.  ?   Pulses: Normal pulses.  ?   Heart sounds: Normal heart sounds. No murmur heard. ?Pulmonary:  ?   Effort: Pulmonary effort is normal. No respiratory distress.  ?   Breath sounds: Normal breath sounds. No wheezing.  ?Chest:  ?   Chest wall: No mass.  ?Breasts: ?   Tanner Score is 5.  ?   Right: Normal. No mass or tenderness.  ?   Left: Normal. No mass or tenderness.  ?Abdominal:  ?   General: Abdomen is flat. Bowel sounds are normal. There is no distension.  ?   Palpations: Abdomen is soft.  ?   Tenderness: There is no abdominal tenderness.  ?Genitourinary: ?   Rectum: Guaiac result negative.  ?Musculoskeletal:     ?   General: Tenderness present. No swelling. Normal range of motion.  ?   Cervical back: Full passive range of motion without pain, normal range of motion and neck supple.  ?   Right lower  leg: No edema.  ?   Left lower leg: No edema.  ?   Comments: Right posterior thoracic pain at rib 9-10.   ?Lymphadenopathy:  ?   Upper Body:  ?   Right upper body: No supraclavicular, axillary or pectoral a

## 2022-02-05 NOTE — Patient Instructions (Signed)

## 2022-02-08 ENCOUNTER — Encounter: Payer: Self-pay | Admitting: Allergy

## 2022-02-08 ENCOUNTER — Ambulatory Visit (INDEPENDENT_AMBULATORY_CARE_PROVIDER_SITE_OTHER): Payer: BC Managed Care – PPO | Admitting: Allergy

## 2022-02-08 VITALS — BP 124/90 | HR 78 | Temp 97.6°F | Resp 16 | Ht 63.0 in | Wt 270.8 lb

## 2022-02-08 DIAGNOSIS — J4540 Moderate persistent asthma, uncomplicated: Secondary | ICD-10-CM | POA: Diagnosis not present

## 2022-02-08 DIAGNOSIS — J3089 Other allergic rhinitis: Secondary | ICD-10-CM | POA: Diagnosis not present

## 2022-02-08 DIAGNOSIS — H1013 Acute atopic conjunctivitis, bilateral: Secondary | ICD-10-CM

## 2022-02-08 DIAGNOSIS — T7800XA Anaphylactic reaction due to unspecified food, initial encounter: Secondary | ICD-10-CM

## 2022-02-08 LAB — CBC
Hematocrit: 39.8 % (ref 34.0–46.6)
Hemoglobin: 12.6 g/dL (ref 11.1–15.9)
MCH: 25.4 pg — ABNORMAL LOW (ref 26.6–33.0)
MCHC: 31.7 g/dL (ref 31.5–35.7)
MCV: 80 fL (ref 79–97)
Platelets: 271 10*3/uL (ref 150–450)
RBC: 4.96 x10E6/uL (ref 3.77–5.28)
RDW: 15.4 % (ref 11.7–15.4)
WBC: 7.3 10*3/uL (ref 3.4–10.8)

## 2022-02-08 LAB — LIPID PANEL
Chol/HDL Ratio: 3.2 ratio (ref 0.0–4.4)
Cholesterol, Total: 185 mg/dL (ref 100–199)
HDL: 57 mg/dL (ref >39)
LDL Chol Calc (NIH): 110 mg/dL — ABNORMAL HIGH (ref 0–99)
Triglycerides: 99 mg/dL (ref 0–149)
VLDL Cholesterol Cal: 18 mg/dL (ref 5–40)

## 2022-02-08 LAB — CMP14+EGFR
ALT: 18 IU/L (ref 0–32)
AST: 22 IU/L (ref 0–40)
Albumin/Globulin Ratio: 1.5 (ref 1.2–2.2)
Albumin: 4.1 g/dL (ref 3.8–4.9)
Alkaline Phosphatase: 86 IU/L (ref 44–121)
BUN/Creatinine Ratio: 13 (ref 9–23)
BUN: 9 mg/dL (ref 6–24)
Bilirubin Total: 0.4 mg/dL (ref 0.0–1.2)
CO2: 27 mmol/L (ref 20–29)
Calcium: 9.4 mg/dL (ref 8.7–10.2)
Chloride: 105 mmol/L (ref 96–106)
Creatinine, Ser: 0.72 mg/dL (ref 0.57–1.00)
Globulin, Total: 2.8 g/dL (ref 1.5–4.5)
Glucose: 76 mg/dL (ref 70–99)
Potassium: 4.2 mmol/L (ref 3.5–5.2)
Sodium: 144 mmol/L (ref 134–144)
Total Protein: 6.9 g/dL (ref 6.0–8.5)
eGFR: 100 mL/min/{1.73_m2} (ref >59)

## 2022-02-08 LAB — MICROALBUMIN / CREATININE URINE RATIO
Creatinine, Urine: 168.9 mg/dL
Microalb/Creat Ratio: 4 mg/g creat (ref 0–29)
Microalbumin, Urine: 6.5 ug/mL

## 2022-02-08 LAB — HEMOGLOBIN A1C
Est. average glucose Bld gHb Est-mCnc: 128 mg/dL
Hgb A1c MFr Bld: 6.1 % — ABNORMAL HIGH (ref 4.8–5.6)

## 2022-02-08 MED ORDER — LEVOCETIRIZINE DIHYDROCHLORIDE 5 MG PO TABS: ORAL_TABLET | ORAL | 1 refills | Status: DC

## 2022-02-08 MED ORDER — EPINEPHRINE 0.3 MG/0.3ML IJ SOAJ: INTRAMUSCULAR | 2 refills | Status: DC

## 2022-02-08 MED ORDER — OLOPATADINE HCL 0.2 % OP SOLN: 1.0000 [drp] | Freq: Every day | OPHTHALMIC | 5 refills | Status: DC

## 2022-02-08 MED ORDER — BREZTRI AEROSPHERE 160-9-4.8 MCG/ACT IN AERO: 2.0000 | INHALATION_SPRAY | Freq: Two times a day (BID) | RESPIRATORY_TRACT | 5 refills | Status: DC

## 2022-02-08 MED ORDER — AZELASTINE-FLUTICASONE 137-50 MCG/ACT NA SUSP: 1.0000 | Freq: Two times a day (BID) | NASAL | 5 refills | Status: DC

## 2022-02-08 NOTE — Patient Instructions (Addendum)
Cough variant asthma ? - continue Breztri 2 puffs twice a day and use with spacer device.   ? - have access to albuterol inhaler 2 puffs every 4-6 hours as needed for cough/wheeze/shortness of breath/chest tightness.  May use 15-20 minutes prior to activity.   Monitor frequency of use.   ? - continue Singulair 10mg  daily at bedtime ? ?Asthma control goals:  ?Full participation in all desired activities (may need albuterol before activity) ?Albuterol use two time or less a week on average (not counting use with activity) ?Cough interfering with sleep two time or less a month ?Oral steroids no more than once a year ?No hospitalizations ? ?Allergies  ? - continue allergen avoidance measures for tree pollen and mold ? - continue Xyzal 5mg  daily.  May take extra dose if needed ? - continue singulair as above ? - continue Flonase + Astelin (nasal antihistamine) 1 spray each nostril twice a day.  This helps with both nasal congestion and drainage.  ? - recommend use of nasal saline rinses to to help keep nose/sinuses flushed out of any pollen, germs, particles.  Use rinse with either distilled water or boil water and bring to room temperate.  Breathe thru your mouth while performing rinse.   ? - use Pataday 1 drop each eye daily as needed for itchy/watery eyes ? - recommend after getting in from being outside to wash/wipe of face and eyes ? ?Food allergy vs intolerance ? - you have had serum IgE detectable levels to wheat and honey however they are low ? - skin testing for food allergens has been negative to all foods ? - continue avoidance of honey and wheat in the diet ? - have access to self-injectable epinephrine (Epipen or AuviQ) 0.3mg  at all times ? - follow emergency action plan in case of allergic reaction ? ?Follow-up in 4-6 months or sooner if needed ? ? ? ?

## 2022-02-08 NOTE — Progress Notes (Signed)
? ? ?Follow-up Note ? ?RE: Jessica Snow MRN: [id number] DOB: 1966/05/08 ?Date of Office Visit: 02/08/2022 ? ? ?History of present illness: ?Jessica Snow is a 56 y.o. female presenting today for follow-up of asthma, allergic rhinitis and adverse food reaction.  She was last seen in the office on 11/07/2021 by myself. ? ?Her allergies are worse now with pollen season.  She reports itchy ears and eyes primarily and some nasal congestion.  She has used warm washcloth to eyes to help. She also has used allergy eyedrop that she got over the counter.  She takes xyzal daily and does feel it continues to helps.  She states last week her principle where she works gave her a dose of zyrtec due to her increased allergy symptoms. She also takes singulair daily.   ?She is using flonase+astelin daily for nasal congestion and drainage symptoms and states this to continues to help.   ? ?She likes Breztri better than symbicort.  She states she hasn't needed to use albuterol since being on Breztri.  She denies any ED or urgent care needs or any systemic steroid needs since being on Breztri. ? ?She does continue to avoid honey and wheat products in diet.  She has not needed to use epinephrine.   ? ? ?Review of systems: ?Review of Systems  ?Constitutional: Negative.   ?HENT:    ?     See HPI  ?Eyes:   ?     See HPI  ?Respiratory: Negative.    ?Cardiovascular: Negative.   ?Gastrointestinal: Negative.   ?Musculoskeletal: Negative.   ?Skin: Negative.   ?Allergic/Immunologic: Negative.   ?Neurological: Negative.    ? ?All other systems negative unless noted above in HPI ? ?Past medical/social/surgical/family history have been reviewed and are unchanged unless specifically indicated below. ? ?No changes ? ?Medication List: ?Current Outpatient Medications  ?Medication Sig Dispense Refill  ? albuterol (VENTOLIN HFA) 108 (90 Base) MCG/ACT inhaler TAKE 2 PUFFS BY MOUTH EVERY 4 TO 6 HOURS AS NEEDED 18 g 3  ? amLODipine (NORVASC) 5 MG tablet  Take 1 tablet (5 mg total) by mouth daily. 90 tablet 3  ? Azelastine-Fluticasone 137-50 MCG/ACT SUSP Place 1 spray into the nose 2 (two) times daily. 23 g 5  ? Budeson-Glycopyrrol-Formoterol (BREZTRI AEROSPHERE) 160-9-4.8 MCG/ACT AERO Inhale 2 puffs into the lungs in the morning and at bedtime. 10.7 g 5  ? EPINEPHrine 0.3 mg/0.3 mL IJ SOAJ injection SMARTSIG:0.3 Milligram(s) IM Once PRN 1 each 2  ? famotidine (PEPCID) 20 MG tablet TAKE 1 TABLET BY MOUTH EVERYDAY AT BEDTIME 90 tablet 0  ? hydrALAZINE (APRESOLINE) 50 MG tablet Take 50 mg by mouth 3 (three) times daily.    ? levocetirizine (XYZAL) 5 MG tablet TAKE 1 TABLET BY MOUTH EVERY DAY IN THE EVENING 90 tablet 0  ? nystatin (NYSTATIN) powder Apply 1 application topically 3 (three) times daily. 15 g 0  ? OZEMPIC, 1 MG/DOSE, 4 MG/3ML SOPN INJECT 1MG  INTO THE SKIN ONCE A WEEK 3 mL 3  ? pantoprazole (PROTONIX) 40 MG tablet Take 1 tablet by mouth daily 180 tablet 0  ? spironolactone (ALDACTONE) 50 MG tablet Take 1 tablet (50 mg total) by mouth daily. 90 tablet 3  ? montelukast (SINGULAIR) 10 MG tablet TAKE 1 TABLET BY MOUTH EVERYDAY AT BEDTIME (Patient not taking: Reported on 02/08/2022) 90 tablet 0  ? nystatin cream (MYCOSTATIN) Apply 1 application topically 2 (two) times daily. (Patient not taking: Reported on 02/08/2022) 30 g 2  ? ?  No current facility-administered medications for this visit.  ?  ? ?Known medication allergies: ?Allergies  ?Allergen Reactions  ? Honey Bee Treatment [Bee Venom]   ? Wheat Bran   ? ? ? ?Physical examination: ?Blood pressure 124/90, pulse 78, temperature 97.6 ?F (36.4 ?C), temperature source Temporal, resp. rate 16, height 5\' 3"  (1.6 m), weight 270 lb 12.8 oz (122.8 kg), last menstrual period 12/19/2005, SpO2 95 %. ? ?General: Alert, interactive, in no acute distress. ?HEENT: PERRLA, TMs pearly gray, turbinates mildly edematous without discharge, post-pharynx non erythematous. ?Neck: Supple without lymphadenopathy. ?Lungs: Clear to  auscultation without wheezing, rhonchi or rales. {no increased work of breathing. ?CV: Normal S1, S2 without murmurs. ?Abdomen: Nondistended, nontender. ?Skin: Warm and dry, without lesions or rashes. ?Extremities:  No clubbing, cyanosis or edema. ?Neuro:   Grossly intact. ? ?Diagnositics/Labs: ? ?Spirometry: FEV1: 1.03 L 46%, FVC: 1.29 L 47%, ratio consistent with restrictive pattern. ? ?Assessment and plan: ?  ?Cough variant asthma ? - continue Breztri 2 puffs twice a day and use with spacer device.   ? - have access to albuterol inhaler 2 puffs every 4-6 hours as needed for cough/wheeze/shortness of breath/chest tightness.  May use 15-20 minutes prior to activity.   Monitor frequency of use.   ? - continue Singulair 10mg  daily at bedtime ? ?Asthma control goals:  ?Full participation in all desired activities (may need albuterol before activity) ?Albuterol use two time or less a week on average (not counting use with activity) ?Cough interfering with sleep two time or less a month ?Oral steroids no more than once a year ?No hospitalizations ? ?Allergic rhinitis with conjunctivitis ? - continue allergen avoidance measures for tree pollen and mold ? - continue Xyzal 5mg  daily.  May take extra dose if needed ? - continue singulair as above ? - continue Flonase + Astelin (nasal antihistamine) 1 spray each nostril twice a day.  This helps with both nasal congestion and drainage.  ? - recommend use of nasal saline rinses to to help keep nose/sinuses flushed out of any pollen, germs, particles.  Use rinse with either distilled water or boil water and bring to room temperate.  Breathe thru your mouth while performing rinse.   ? - use Pataday 1 drop each eye daily as needed for itchy/watery eyes ? - recommend after getting in from being outside to wash/wipe of face and eyes ? ?Food allergy vs intolerance ? - you have had serum IgE detectable levels to wheat and honey however they are low ? - skin testing for food allergens  has been negative to all foods ? - continue avoidance of honey and wheat in the diet ? - have access to self-injectable epinephrine (Epipen or AuviQ) 0.3mg  at all times ? - follow emergency action plan in case of allergic reaction ? ?Follow-up in 4-6 months or sooner if needed ? ?I appreciate the opportunity to take part in Jessica Snow's care. Please do not hesitate to contact me with questions. ? ?Sincerely, ? ? ?12/21/2005, MD ?Allergy/Immunology ?[facility] ? ? ?

## 2022-03-14 ENCOUNTER — Ambulatory Visit
Admission: RE | Admit: 2022-03-14 | Discharge: 2022-03-14 | Disposition: A | Discharge status: Home / Self Care | Payer: BC Managed Care – PPO | Source: Ambulatory Visit | Attending: Nurse Practitioner | Admitting: Nurse Practitioner

## 2022-03-14 DIAGNOSIS — Z1231 Encounter for screening mammogram for malignant neoplasm of breast: Secondary | ICD-10-CM

## 2022-03-14 LAB — HM MAMMOGRAPHY: HM Mammogram: NORMAL (ref 0–4)

## 2022-03-14 IMAGING — MG MM DIGITAL SCREENING BILAT W/ TOMO AND CAD
8 of 10 series · 8 of 22 positions shown · non-contrast
Comparison: Previous exam(s).

CLINICAL DATA: Screening.

EXAM:
DIGITAL SCREENING BILATERAL MAMMOGRAM WITH TOMOSYNTHESIS AND CAD
TECHNIQUE: Bilateral screening digital craniocaudal and mediolateral oblique
mammograms were obtained. Bilateral screening digital breast
tomosynthesis was performed. The images were evaluated with
computer-aided detection.

[R CC synth-2D]
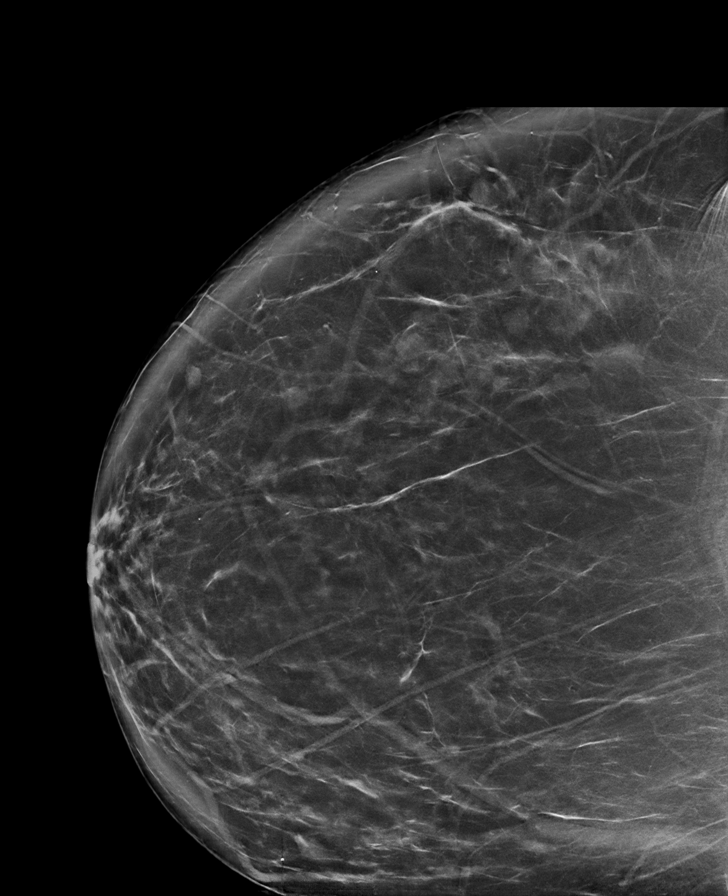

[R MLO synth-2D (1 of 2)]
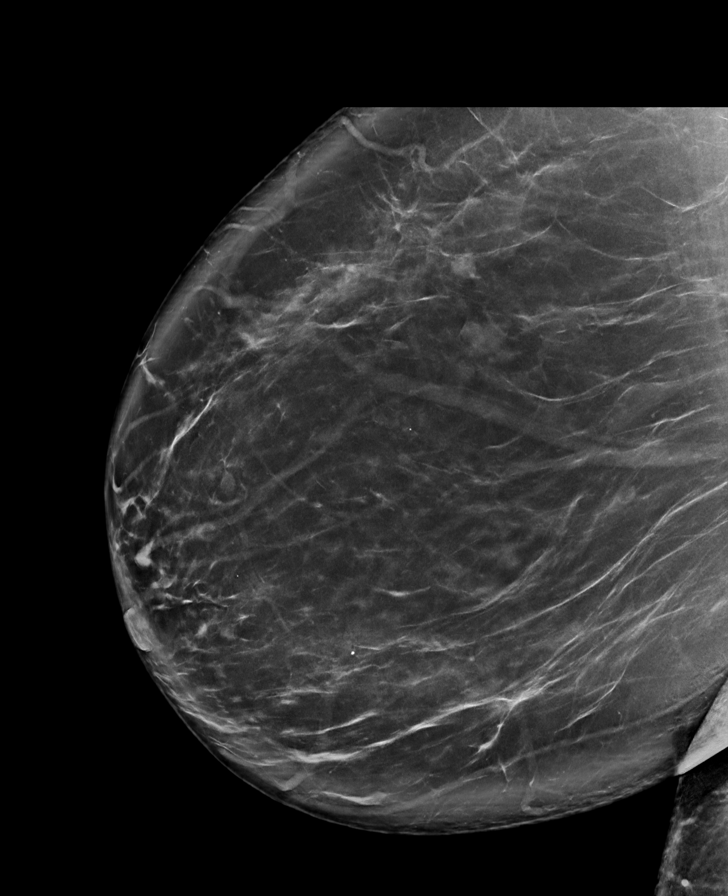

[L CC synth-2D]
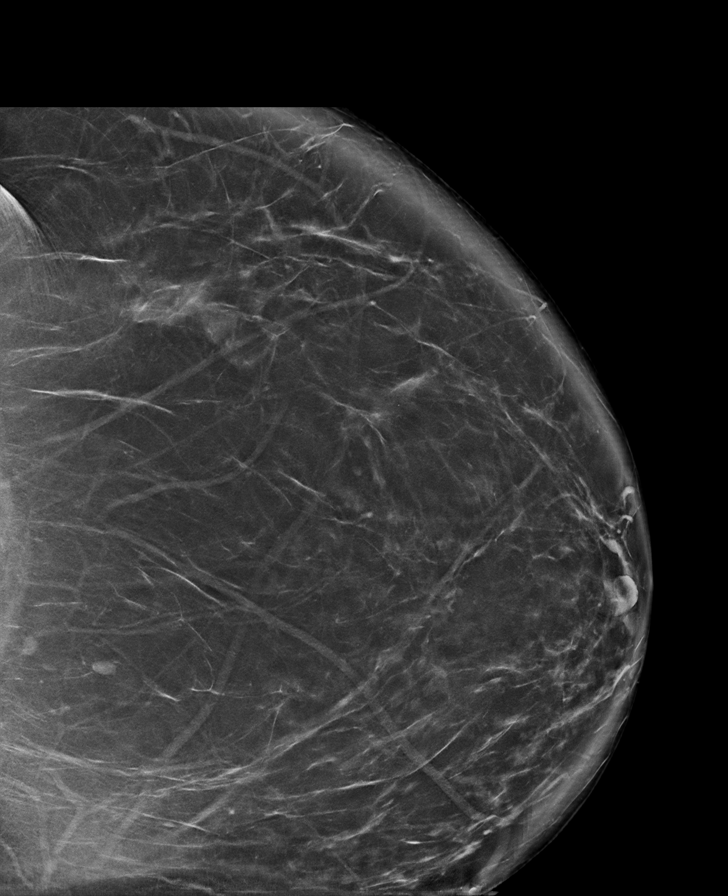

[L MLO synth-2D (1 of 2)]
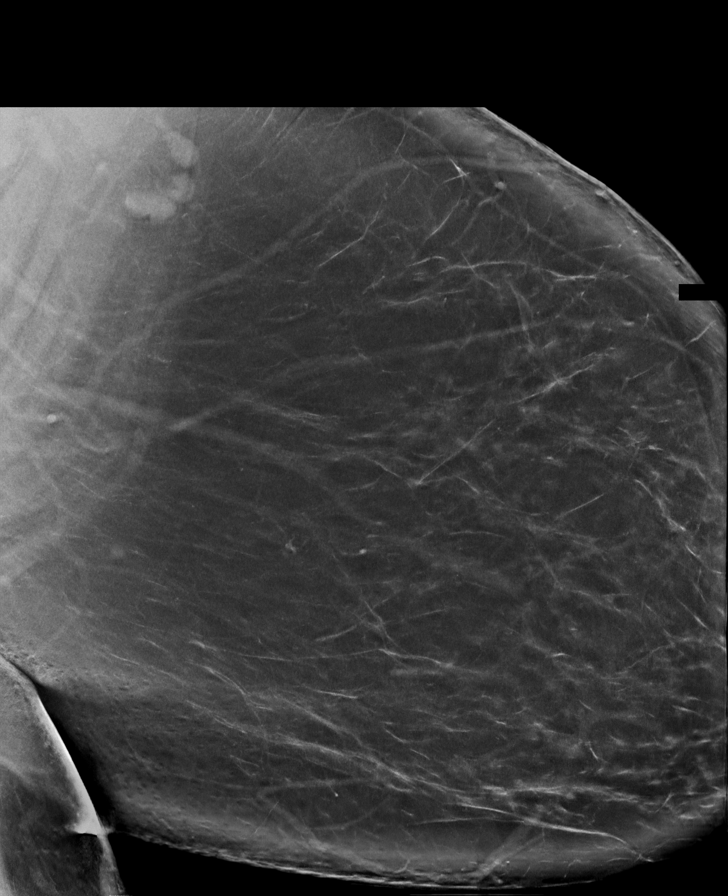

[L CV synth-2D]
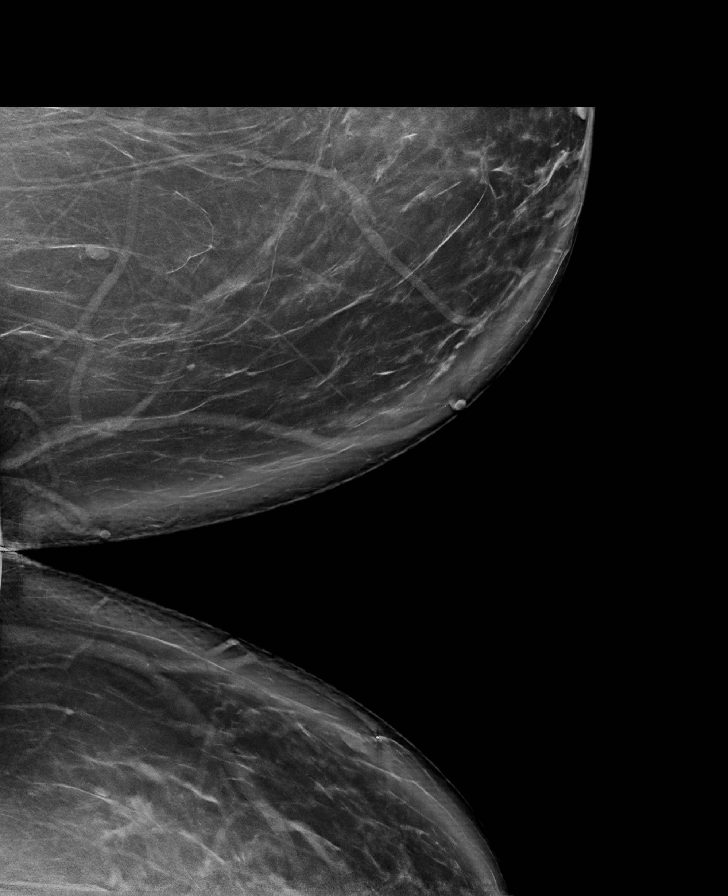

[L MLO synth-2D (2 of 2)]
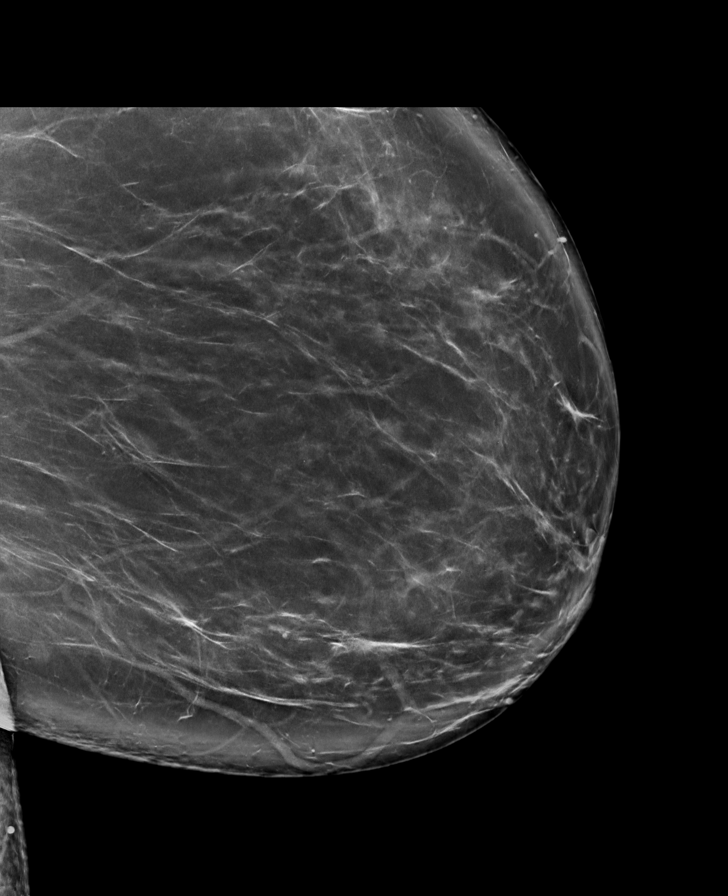

[R MLO synth-2D (2 of 2)]
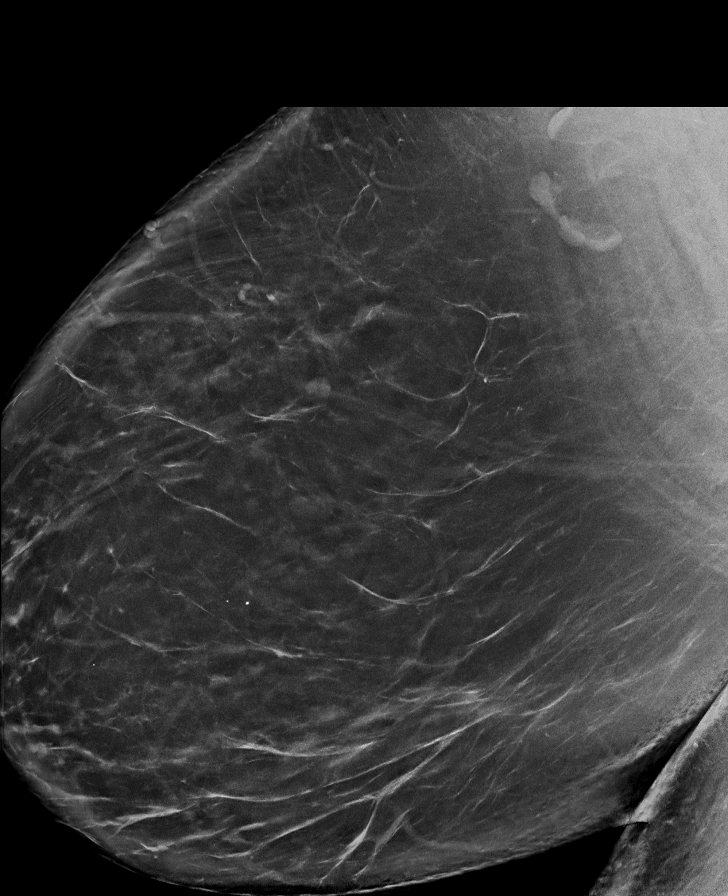

[R MLO tomo · tomo slice 60/119.0]
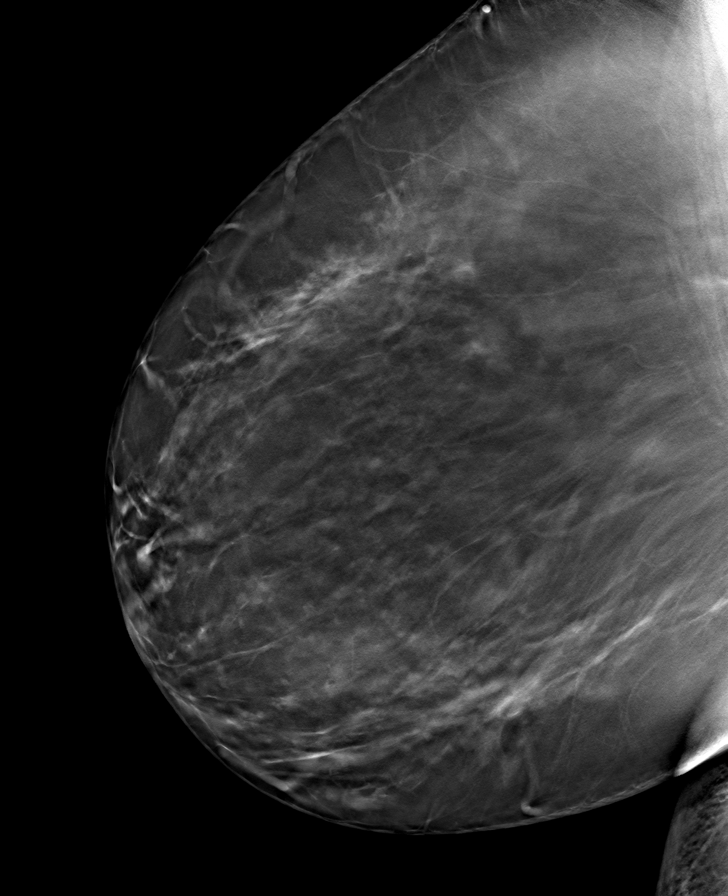

[8 of 22 positions shown; findings below may reference images not displayed]

ACR Breast Density Category b: There are scattered areas of
fibroglandular density.
FINDINGS: There are no findings suspicious for malignancy.
IMPRESSION: No mammographic evidence of malignancy. A result letter of this
screening mammogram will be mailed directly to the patient.

RECOMMENDATION:
Screening mammogram in one year. (Code:[BY])

BI-RADS CATEGORY  1: Negative.

## 2022-03-20 ENCOUNTER — Ambulatory Visit
Admission: RE | Admit: 2022-03-20 | Discharge: 2022-03-20 | Disposition: A | Discharge status: Home / Self Care | Payer: BC Managed Care – PPO | Source: Ambulatory Visit | Attending: Nurse Practitioner | Admitting: Nurse Practitioner

## 2022-03-20 ENCOUNTER — Other Ambulatory Visit: Payer: Self-pay | Admitting: Nurse Practitioner

## 2022-03-20 DIAGNOSIS — M546 Pain in thoracic spine: Secondary | ICD-10-CM

## 2022-03-27 ENCOUNTER — Other Ambulatory Visit: Payer: Self-pay | Admitting: Nurse Practitioner

## 2022-03-27 DIAGNOSIS — M7989 Other specified soft tissue disorders: Secondary | ICD-10-CM

## 2022-04-02 ENCOUNTER — Other Ambulatory Visit: Payer: Self-pay | Admitting: Nurse Practitioner

## 2022-04-04 ENCOUNTER — Other Ambulatory Visit: Payer: BC Managed Care – PPO

## 2022-06-05 ENCOUNTER — Encounter (INDEPENDENT_AMBULATORY_CARE_PROVIDER_SITE_OTHER): Payer: Self-pay

## 2022-06-10 ENCOUNTER — Ambulatory Visit: Payer: BC Managed Care – PPO | Admitting: Internal Medicine

## 2022-06-17 ENCOUNTER — Ambulatory Visit (INDEPENDENT_AMBULATORY_CARE_PROVIDER_SITE_OTHER): Payer: BC Managed Care – PPO | Admitting: Internal Medicine

## 2022-06-17 ENCOUNTER — Encounter: Payer: Self-pay | Admitting: Internal Medicine

## 2022-06-17 VITALS — BP 140/80 | HR 81 | Temp 98.1°F | Ht 63.0 in | Wt 278.6 lb

## 2022-06-17 DIAGNOSIS — I110 Hypertensive heart disease with heart failure: Secondary | ICD-10-CM | POA: Diagnosis not present

## 2022-06-17 DIAGNOSIS — Z23 Encounter for immunization: Secondary | ICD-10-CM

## 2022-06-17 DIAGNOSIS — E1169 Type 2 diabetes mellitus with other specified complication: Secondary | ICD-10-CM

## 2022-06-17 DIAGNOSIS — B353 Tinea pedis: Secondary | ICD-10-CM | POA: Diagnosis not present

## 2022-06-17 DIAGNOSIS — E7800 Pure hypercholesterolemia, unspecified: Secondary | ICD-10-CM | POA: Insufficient documentation

## 2022-06-17 DIAGNOSIS — R55 Syncope and collapse: Secondary | ICD-10-CM

## 2022-06-17 DIAGNOSIS — E119 Type 2 diabetes mellitus without complications: Secondary | ICD-10-CM

## 2022-06-17 DIAGNOSIS — Z6841 Body Mass Index (BMI) 40.0 and over, adult: Secondary | ICD-10-CM

## 2022-06-17 DIAGNOSIS — I5032 Chronic diastolic (congestive) heart failure: Secondary | ICD-10-CM | POA: Diagnosis not present

## 2022-06-17 MED ORDER — OZEMPIC (1 MG/DOSE) 4 MG/3ML ~~LOC~~ SOPN: PEN_INJECTOR | SUBCUTANEOUS | 3 refills | Status: DC

## 2022-06-17 MED ORDER — NYSTATIN 100000 UNIT/GM EX CREA: 1.0000 | TOPICAL_CREAM | Freq: Two times a day (BID) | CUTANEOUS | 2 refills | Status: AC

## 2022-06-17 NOTE — Progress Notes (Signed)
I,Victoria T Hamilton,acting as a scribe for [name], MD.,have documented all relevant documentation on the behalf of [name], MD,as directed by  [name], MD while in the presence of [name], MD.    Subjective:     Patient ID: Jessica Snow , female    DOB: 1966-06-04 , 56 y.o.   MRN: [id number]   Chief Complaint  Patient presents with   Diabetes   Hypertension    HPI  The patient is here today for a follow-up on diabetes and HTN. She reports compliance with meds. Admits she is not exercising as much as she should. She denies headaches and chest pain. She does get SOB when exercising, sx resolve when she stops.   Exercise: She is trying to walk. Patient has DM eye exam scheduled this Thursday.       Diabetes She presents for her follow-up diabetic visit. She has type 2 diabetes mellitus. There are no hypoglycemic associated symptoms. There are no diabetic associated symptoms. Pertinent negatives for diabetes include no blurred vision, no chest pain, no polydipsia, no polyphagia, no polyuria and no weakness. There are no hypoglycemic complications. Risk factors for coronary artery disease include diabetes mellitus, obesity, hypertension and sedentary lifestyle.  Hypertension This is a chronic problem. The current episode started more than 1 year ago. The problem has been gradually improving since onset. The problem is uncontrolled. Pertinent negatives include no blurred vision, chest pain, palpitations or shortness of breath. Past treatments include angiotensin blockers and diuretics. The current treatment provides mild improvement. Compliance problems include exercise.      Past Medical History:  Diagnosis Date   Ascending aorta dilatation (HCC)    Asthma    Chronic diastolic CHF (congestive heart failure) (HCC)    Chronic rhinitis    Cough    Dilation of pulmonary artery (HCC)    Edema, lower extremity    GERD (gastroesophageal reflux disease)     HTN (hypertension)    Mild aortic insufficiency    Morbid obesity ([place])    Murmur    Pneumonia    Pre-diabetes      Family History  Problem Relation Age of Onset   Hypertension Mother    Diabetes Mother    Heart disease Mother    Sleep apnea Mother    Obesity Mother    Hypertension Father    Anemia Father    Breast cancer Neg Hx      Current Outpatient Medications:    albuterol (VENTOLIN HFA) 108 (90 Base) MCG/ACT inhaler, TAKE 2 PUFFS BY MOUTH EVERY 4 TO 6 HOURS AS NEEDED, Disp: 18 g, Rfl: 3   Azelastine-Fluticasone 137-50 MCG/ACT SUSP, Place 1 spray into the nose 2 (two) times daily., Disp: 23 g, Rfl: 5   Budeson-Glycopyrrol-Formoterol (BREZTRI AEROSPHERE) 160-9-4.8 MCG/ACT AERO, Inhale 2 puffs into the lungs in the morning and at bedtime., Disp: 10.7 g, Rfl: 5   EPINEPHrine 0.3 mg/0.3 mL IJ SOAJ injection, SMARTSIG:0.3 Milligram(s) IM Once PRN, Disp: 1 each, Rfl: 2   famotidine (PEPCID) 20 MG tablet, TAKE 1 TABLET BY MOUTH EVERYDAY AT BEDTIME, Disp: 90 tablet, Rfl: 0   hydrALAZINE (APRESOLINE) 50 MG tablet, Take 50 mg by mouth 3 (three) times daily., Disp: , Rfl:    levocetirizine (XYZAL) 5 MG tablet, TAKE 1 TABLET BY MOUTH EVERY DAY IN THE EVENING, Disp: 90 tablet, Rfl: 1   montelukast (SINGULAIR) 10 MG tablet, TAKE 1 TABLET BY MOUTH EVERYDAY AT BEDTIME, Disp:  90 tablet, Rfl: 0   nystatin (NYSTATIN) powder, Apply 1 application topically 3 (three) times daily., Disp: 15 g, Rfl: 0   pantoprazole (PROTONIX) 40 MG tablet, Take 1 tablet by mouth daily, Disp: 180 tablet, Rfl: 0   spironolactone (ALDACTONE) 50 MG tablet, Take 1 tablet (50 mg total) by mouth daily., Disp: 90 tablet, Rfl: 3   amLODipine (NORVASC) 5 MG tablet, Take 1 tablet (5 mg total) by mouth daily., Disp: 90 tablet, Rfl: 3   nystatin cream (MYCOSTATIN), Apply 1 Application topically 2 (two) times daily., Disp: 30 g, Rfl: 2   Olopatadine HCl 0.2 % SOLN, Apply 1 drop to eye daily. (Patient not taking: Reported on  06/17/2022), Disp: 2.5 mL, Rfl: 5   Semaglutide, 1 MG/DOSE, (OZEMPIC, 1 MG/DOSE,) 4 MG/3ML SOPN, INJECT $RemoveBefo'1MG'OlbkWRoCTPA$  INTO THE SKIN ONCE A WEEK, Disp: 3 mL, Rfl: 3   Allergies  Allergen Reactions   Honey Bee Treatment [Bee Venom]    Wheat Bran      Review of Systems  Constitutional: Negative.   Eyes:  Negative for blurred vision.  Respiratory: Negative.  Negative for shortness of breath.   Cardiovascular: Negative.  Negative for chest pain and palpitations.  Endocrine: Negative for polydipsia, polyphagia and polyuria.  Neurological: Negative.  Negative for weakness.  Psychiatric/Behavioral: Negative.       Today's Vitals   06/17/22 1622 06/17/22 1638  BP: (!) 150/80 (!) 140/80  Pulse: 81   Temp: 98.1 F (36.7 C)   SpO2: 96%   Weight: 278 lb 9.6 oz (126.4 kg)   Height: $Remove'5\' 3"'uGxcKqj$  (1.6 m)   PainSc: 0-No pain    Body mass index is 49.35 kg/m.  Wt Readings from Last 3 Encounters:  06/17/22 278 lb 9.6 oz (126.4 kg)  02/08/22 270 lb 12.8 oz (122.8 kg)  02/05/22 269 lb 9.6 oz (122.3 kg)    Objective:  Physical Exam Vitals and nursing note reviewed.  Constitutional:      Appearance: Normal appearance. She is obese.  HENT:     Head: Normocephalic and atraumatic.  Eyes:     Extraocular Movements: Extraocular movements intact.  Cardiovascular:     Rate and Rhythm: Normal rate and regular rhythm.     Heart sounds: Normal heart sounds.  Pulmonary:     Effort: Pulmonary effort is normal.     Breath sounds: Normal breath sounds.  Musculoskeletal:     Cervical back: Normal range of motion.  Feet:     Right foot:     Skin integrity: Dry skin present.     Left foot:     Skin integrity: Dry skin present.     Comments: Scaly ski b/l, esp in between toes Skin:    General: Skin is warm.     Findings: Erythema and rash present.     Comments: Erythematous scaly rash on medial side of right foot.   Neurological:     General: No focal deficit present.     Mental Status: She is alert.   Psychiatric:        Mood and Affect: Mood normal.        Behavior: Behavior normal.      Assessment And Plan:     1. Type 2 diabetes mellitus without complication, without long-term current use of insulin (HCC) Comments: Chronic, I will check labs as below. She will resume Ozempic, 0.$RemoveBeforeDEI'5mg'TDiNijmEoDRilXLv$  x 2 weeks, then resume $RemoveBef'1mg'aQvejQWZgl$  weekly. She will f/u in 3 months.  - Hemoglobin A1c - BMP8+EGFR  2. Hypertensive heart disease with chronic diastolic congestive heart failure (HCC) Comments: Chronic, uncontrolled. Importance of dietary and medication compliance was stressed to the patient. I will adjust meds at next visit if elevated.   3. Tinea pedis of both feet Comments: I will send rx nystatin cream to use on afected areas bid prn.  4. Class 3 severe obesity due to excess calories with serious comorbidity and body mass index (BMI) of 45.0 to 49.9 in adult [facility]) Comments: She is encouraged to strive for BMI less than 30 to decrease cardiac risk. Advised to aim for at least 150 minutes of exercise per week.    5. Immunization due - Zoster Recombinant (Shingrix )  Patient was given opportunity to ask questions. Patient verbalized understanding of the plan and was able to repeat key elements of the plan. All questions were answered to their satisfaction.   I, [name], MD, have reviewed all documentation for this visit. The documentation on 06/17/22 for the exam, diagnosis, procedures, and orders are all accurate and complete.   IF YOU HAVE BEEN REFERRED TO A SPECIALIST, IT MAY TAKE 1-2 WEEKS TO SCHEDULE/PROCESS THE REFERRAL. IF YOU HAVE NOT HEARD FROM US/SPECIALIST IN TWO WEEKS, PLEASE GIVE [place] A CALL AT [phone].   THE PATIENT IS ENCOURAGED TO PRACTICE SOCIAL DISTANCING DUE TO THE COVID-19 PANDEMIC.

## 2022-06-17 NOTE — Patient Instructions (Signed)

## 2022-06-18 LAB — BMP8+EGFR
BUN/Creatinine Ratio: 15 (ref 9–23)
BUN: 12 mg/dL (ref 6–24)
CO2: 22 mmol/L (ref 20–29)
Calcium: 9.7 mg/dL (ref 8.7–10.2)
Chloride: 107 mmol/L — ABNORMAL HIGH (ref 96–106)
Creatinine, Ser: 0.82 mg/dL (ref 0.57–1.00)
Glucose: 112 mg/dL — ABNORMAL HIGH (ref 70–99)
Potassium: 4.8 mmol/L (ref 3.5–5.2)
Sodium: 148 mmol/L — ABNORMAL HIGH (ref 134–144)
eGFR: 85 mL/min/{1.73_m2} (ref >59)

## 2022-06-18 LAB — HEMOGLOBIN A1C
Est. average glucose Bld gHb Est-mCnc: 160 mg/dL
Hgb A1c MFr Bld: 7.2 % — ABNORMAL HIGH (ref 4.8–5.6)

## 2022-06-20 LAB — HM DIABETES EYE EXAM

## 2022-06-23 DIAGNOSIS — B353 Tinea pedis: Secondary | ICD-10-CM | POA: Insufficient documentation

## 2022-07-02 DIAGNOSIS — Z23 Encounter for immunization: Secondary | ICD-10-CM | POA: Diagnosis not present

## 2022-07-20 ENCOUNTER — Other Ambulatory Visit: Payer: Self-pay | Admitting: Internal Medicine

## 2022-08-07 NOTE — Progress Notes (Deleted)
Cardiology Office Note:    Date:  08/07/2022   ID:  Jessica Snow, DOB February 24, 1966, MRN [id number]  PCP:  Dorothyann Peng, MD   [facility] Providers Cardiologist:  Meriam Sprague, MD Sleep Medicine:  Armanda Magic, MD  {   Referring MD: Dorothyann Peng, MD     History of Present Illness:    Jessica Snow is a 56 y.o. female with a hx of  chronic diastolic heart failure, hypertension, obesity, dilated ascending thoracic aorta, syncope, OSA who was previously followed by Dr. Delton See who presents to clinic for follow-up.  Previous CTA 2019 with coronary calcium score of 0, mild AI, ascending aortic dilation 32mm by CTA 2020.   She was seen in the ED 07/22/21 after syncopal episode in church. Troponin went from 12 to 18 but was presumed due to hypertension or CHF. She was provided dose of Lasix.   Seen in clinic 07/31/21 where she was doing well. Had some atypical chest pain. No further syncope. Cardiac monitor with NSR with rare PVCs, 1 episode of NSVT 7 beats.   Was last seen in on 11/2021 where she was doing well from a CV standpoint. We started amlodipine for elevated blood pressure.   Today, ***  Past Medical History:  Diagnosis Date   Ascending aorta dilatation (HCC)    Asthma    Chronic diastolic CHF (congestive heart failure) (HCC)    Chronic rhinitis    Cough    Dilation of pulmonary artery (HCC)    Edema, lower extremity    GERD (gastroesophageal reflux disease)    HTN (hypertension)    Mild aortic insufficiency    Morbid obesity (HCC)    Murmur    Pneumonia    Pre-diabetes     Past Surgical History:  Procedure Laterality Date   ANKLE RECONSTRUCTION     TONSILLECTOMY     TOTAL VAGINAL HYSTERECTOMY  2007    Current Medications: No outpatient medications have been marked as taking for the 08/09/22 encounter (Appointment) with Meriam Sprague, MD.     Allergies:   Honey bee treatment [bee venom] and Wheat bran   Social History    Socioeconomic History   Marital status: Single    Spouse name: Not on file   Number of children: Not on file   Years of education: Not on file   Highest education level: Not on file  Occupational History   Occupation: Youth worker for [employer]    Employer: [employer]  Tobacco Use   Smoking status: Former    Packs/day: 0.25    Years: 10.00    Total pack years: 2.50    Types: Cigarettes    Quit date: 01/26/2018    Years since quitting: 4.5   Smokeless tobacco: Former  Building services engineer Use: Never used  Substance and Sexual Activity   Alcohol use: No   Drug use: No   Sexual activity: Never  Other Topics Concern   Not on file  Social History Narrative   Lives with two children.     Social Determinants of Health   Financial Resource Strain: Not on file  Food Insecurity: Not on file  Transportation Needs: Not on file  Physical Activity: Not on file  Stress: Not on file  Social Connections: Not on file     Family History: The patient's family history includes Anemia in her father; Diabetes in her mother; Heart disease in her mother; Hypertension in her father and mother; Obesity  in her mother; Sleep apnea in her mother. There is no history of Breast cancer.  ROS:   Please see the history of present illness.    Review of Systems  Constitutional:  Negative for chills and fever.  Respiratory:  Negative for sputum production.   Cardiovascular:  Positive for leg swelling. Negative for chest pain, palpitations, orthopnea, claudication and PND.  Neurological:  Negative for dizziness and loss of consciousness.     EKGs/Labs/Other Studies Reviewed:    The following studies were reviewed today: TTE 2021/09/05: IMPRESSIONS     1. Left ventricular ejection fraction, by estimation, is 65 to 70%. The  left ventricle has normal function. The left ventricle has no regional  wall motion abnormalities. Left ventricular diastolic parameters were  normal.   2. Right ventricular  systolic function is normal. The right ventricular  size is normal. There is normal pulmonary artery systolic pressure.   3. The mitral valve is normal in structure. No evidence of mitral valve  regurgitation.   4. The aortic valve is normal in structure. Aortic valve regurgitation is  not visualized. No aortic stenosis is present.   Comparison(s): 11/03/20 EF 55-60%. Ascending aorta 88mm.   Conclusion(s)/Recommendation(s): Normal study.   Cardiac Monitor 07/2021: Predominant rhythm showed normal sinus rhythm with average heart rate 76 bpm. The heart rate ranged from 50 to 125 bpm. Rare PVCs with PVC load less than 1% PACs and nonsustained atrial tachycardia with longest run lasting 7 beats at 171 bpm     Patch Wear Time:  9 days and 20 hours (2022-10-04T10:08:01-399 to 2022-10-14T06:14:35-0400)   Patient had a min HR of 50 bpm, max HR of 171 bpm, and avg HR of 76 bpm. Predominant underlying rhythm was Sinus Rhythm. 2 Supraventricular Tachycardia runs occurred, the run with the fastest interval lasting 7 beats with a max rate of 171 bpm (avg 151  bpm); the run with the fastest interval was also the longest. Isolated SVEs were rare (<1.0%), SVE Couplets were rare (<1.0%), and SVE Triplets were rare (<1.0%). Isolated VEs were rare (<1.0%), and no VE Couplets or VE Triplets were present  MRI 08/10/21: FINDINGS: 1. Normal left ventricular size, with LVEDD 55 mm, and LVEDVi 77 mL/m2.   Normal left ventricular thickness, with intraventricular septal thickness of 12 mm, posterior wall thickness of 6 mm, and myocardial mass index of 51 g/m2.   Normal left ventricular systolic function (LVEF =25%). There are no regional wall motion abnormalities.   Left ventricular parametric mapping notable for normal ECV and T2 signal.   There is no late gadolinium enhancement in the left ventricular myocardium.   2. Normal right ventricular size with RVEDVI 70 mL/m2.   Normal right ventricular  thickness.   Normal right ventricular systolic function (RVEF = 56%). There are no regional wall motion abnormalities or aneurysms.   3.  Normal left and right atrial size.   4. Aortic MRA: The great vessels arose normally from the arch.   There was no evidence of coarctation.   The following measurements were obtained:   Sinus of Valsalva: 26 mm   Sinotubular Junction: 25 mm   Ascending Aorta: 39 mm; this is at the upper limits of normal for age and body surface area.   Aortic Arch: 25 mm   Descending Thoracic Aorta: 25 mm   Moderate to severe dilation of the main pulmonary artery 35 mm with trunk to aorta ratio of 0.9. This can be associated with the presence of pulmonary  hypertension; clinical correlation advised.   5.  No significant valvular abnormalities.   Tri-leaflet aortic valve.   No significant different in LV and RV stroke volume.   6. Normal pericardium. No pericardial effusion. Prominent pericardial fat anterior the right ventricle   7. Grossly, no extracardiac findings. Recommended dedicated study if concerned for non-cardiac pathology.   IMPRESSION: Ascending Aorta is at the upper limit of normal.   Moderate to severe dilation of the main pulmonary artery 35 mm with trunk to aorta ratio of 0.9. This can be associated with the presence of pulmonary hypertension.  EKG:  No new tracing  Recent Labs: 02/05/2022: ALT 18; Hemoglobin 12.6; Platelets 271 06/17/2022: BUN 12; Creatinine, Ser 0.82; Potassium 4.8; Sodium 148  Recent Lipid Panel    Component Value Date/Time   CHOL 185 02/05/2022 1632   TRIG 99 02/05/2022 1632   HDL 57 02/05/2022 1632   CHOLHDL 3.2 02/05/2022 1632   LDLCALC 110 (H) 02/05/2022 1632     Risk Assessment/Calculations:           Physical Exam:    VS:  LMP 12/19/2005     Wt Readings from Last 3 Encounters:  06/17/22 278 lb 9.6 oz (126.4 kg)  02/08/22 270 lb 12.8 oz (122.8 kg)  02/05/22 269 lb 9.6 oz (122.3 kg)      GEN:  Well nourished, well developed in no acute distress HEENT: Normal NECK: No JVD; No carotid bruits CARDIAC: RRR, no murmurs, rubs, gallops RESPIRATORY:  Clear to auscultation without rales, wheezing or rhonchi  ABDOMEN: Soft, non-tender, non-distended MUSCULOSKELETAL:  No edema; No deformity  SKIN: Warm and dry NEUROLOGIC:  Alert and oriented x 3 PSYCHIATRIC:  Normal affect   ASSESSMENT:    No diagnosis found. PLAN:    In order of problems listed above:  #Syncope: Had episode of syncope at church in the setting of not eating breakfast of not eating. Echo 11/03/20 normal LVEF, no significant valvular abnormalities, mildly dilated ascending aorta 23mm. Cardiac monitor with rare ectopy with nonsustained atach episode lasting 7 beats. Otherwise no significant changes. -Continue to monitor -Maintain good hydration  #Chronic Diastolic HF: Doing well and euvolemic. -Continue spironolactone 50mg  daily -Not on lasix and euvolemic -BP control as below -Can add farxiga at next visit  #HTN: Elevated 140s at home. -Continue hydralazine 50mg  TID -Continue spironolactone 50mg  daily -Continue amlodipine 5mg  daily  #OSA -Continue CPAP -Follow-up with as scheduled  #Thoracic Aortic Aneurysm Measures 1mm on MRI 07/2021. -Continue yearly monitoring          Medication Adjustments/Labs and Tests Ordered: Current medicines are reviewed at length with the patient today.  Concerns regarding medicines are outlined above.  No orders of the defined types were placed in this encounter.  No orders of the defined types were placed in this encounter.   There are no Patient Instructions on file for this visit.    Signed, , MD  08/07/2022 4:22 PM    [facility]

## 2022-08-09 ENCOUNTER — Ambulatory Visit: Payer: BC Managed Care – PPO | Attending: Cardiology | Admitting: Cardiology

## 2022-08-09 ENCOUNTER — Encounter: Payer: Self-pay | Admitting: Cardiology

## 2022-08-09 VITALS — BP 142/80 | HR 72 | Ht 63.0 in | Wt 276.8 lb

## 2022-08-09 DIAGNOSIS — I5032 Chronic diastolic (congestive) heart failure: Secondary | ICD-10-CM | POA: Diagnosis not present

## 2022-08-09 DIAGNOSIS — I10 Essential (primary) hypertension: Secondary | ICD-10-CM | POA: Diagnosis not present

## 2022-08-09 DIAGNOSIS — I77810 Thoracic aortic ectasia: Secondary | ICD-10-CM

## 2022-08-09 DIAGNOSIS — R55 Syncope and collapse: Secondary | ICD-10-CM

## 2022-08-09 DIAGNOSIS — G4733 Obstructive sleep apnea (adult) (pediatric): Secondary | ICD-10-CM

## 2022-08-09 MED ORDER — AMLODIPINE BESYLATE 10 MG PO TABS: 10.0000 mg | ORAL_TABLET | Freq: Every day | ORAL | 3 refills | Status: DC

## 2022-08-09 NOTE — Patient Instructions (Signed)
Medication Instructions:   INCREASE YOUR AMLODIPINE TO 10 MG BY MOUTH DAILY  *If you need a refill on your cardiac medications before your next appointment, please call your pharmacy*   Testing/Procedures:  Your physician has requested that you have an echocardiogram. Echocardiography is a painless test that uses sound waves to create images of your heart. It provides your doctor with information about the size and shape of your heart and how well your heart's chambers and valves are working. This procedure takes approximately one hour. There are no restrictions for this procedure. Please do NOT wear cologne, perfume, aftershave, or lotions (deodorant is allowed). Please arrive 15 minutes prior to your appointment time.   Follow-Up: At [facility], you and your health needs are our priority.  As part of our continuing mission to provide you with exceptional heart care, we have created designated Provider Care Teams.  These Care Teams include your primary Cardiologist (physician) and Advanced Practice Providers (APPs -  Physician Assistants and Nurse Practitioners) who all work together to provide you with the care you need, when you need it.  We recommend signing up for the patient portal called "MyChart".  Sign up information is provided on this After Visit Summary.  MyChart is used to connect with patients for Virtual Visits (Telemedicine).  Patients are able to view lab/test results, encounter notes, upcoming appointments, etc.  Non-urgent messages can be sent to your provider as well.   To learn more about what you can do with MyChart, go to [email or web address].    Your next appointment:   6 month(s)  The format for your next appointment:   In Person  Provider:   Freada Bergeron, MD      Important Information About Sugar

## 2022-08-09 NOTE — Progress Notes (Signed)
Cardiology Office Note:    Date:  08/09/2022   ID:  Jessica Snow, DOB Oct 15, 1966, MRN [id number]  PCP:  Glendale Chard, MD   [facility] Providers Cardiologist:  Freada Bergeron, MD Sleep Medicine:  Fransico Him, MD  { Referring MD: Glendale Chard, MD   History of Present Illness:    Jessica Snow is a 56 y.o. female with a hx of  chronic diastolic heart failure, hypertension, obesity, dilated ascending thoracic aorta, syncope, OSA who was previously followed by Dr. Meda Coffee who presents to clinic for follow-up.  Previous CTA 2019 with coronary calcium score of 0, mild AI, ascending aortic dilation 9mm by CTA 2020.   She was seen in the ED 07/22/21 after syncopal episode in church. Troponin went from 12 to 18 but was presumed due to hypertension or CHF. She was provided dose of Lasix.   Seen in clinic 07/31/21 where she was doing well. Had some atypical chest pain. No further syncope. Cardiac monitor with NSR with rare PVCs, 1 episode of NSVT 7 beats.   Was last seen in on 11/2021 where she was doing well from a CV standpoint. We started amlodipine for elevated blood pressure.   Today, the patient states that she is experiencing a "tinging pinch" in the back of her shoulders, also described as a sting. This has been ongoing intermittently for a couple months now. She also complains of lower back pain and neck pain. Pain is not exertional but worsens with certain positions.  Occasionally she has minor episodes of cough and palpitations, especially when she is feeling stressed.  Bilateral LE edema is common for her as she is always on her feet. She notes that her swelling is minor today.  In clinic today her BP is 142/80. At home she reports lower blood pressures on average, [id number] systolic. She attributes this improvement to her efforts for weight loss. Mainly she struggles with eating too much. For the past 2 weeks she has started intermittent fasting.  Lately she has  been unable to obtain her Ozempic due to insurance issues. Previously she tried [place].  She has a CPAP but is unable to use it until she obtains a replacement mask.  She denies any chest pain, lightheadedness, headaches, syncope, orthopnea, or PND.   Past Medical History:  Diagnosis Date   Ascending aorta dilatation (HCC)    Asthma    Chronic diastolic CHF (congestive heart failure) (HCC)    Chronic rhinitis    Cough    Dilation of pulmonary artery (HCC)    Edema, lower extremity    GERD (gastroesophageal reflux disease)    HTN (hypertension)    Mild aortic insufficiency    Morbid obesity (HCC)    Murmur    Pneumonia    Pre-diabetes     Past Surgical History:  Procedure Laterality Date   ANKLE RECONSTRUCTION     TONSILLECTOMY     TOTAL VAGINAL HYSTERECTOMY  2007    Current Medications: Current Meds  Medication Sig   albuterol (VENTOLIN HFA) 108 (90 Base) MCG/ACT inhaler TAKE 2 PUFFS BY MOUTH EVERY 4 TO 6 HOURS AS NEEDED   amLODipine (NORVASC) 10 MG tablet Take 1 tablet (10 mg total) by mouth daily.   Azelastine-Fluticasone 137-50 MCG/ACT SUSP Place 1 spray into the nose 2 (two) times daily.   Budeson-Glycopyrrol-Formoterol (BREZTRI AEROSPHERE) 160-9-4.8 MCG/ACT AERO Inhale 2 puffs into the lungs in the morning and at bedtime.   EPINEPHrine 0.3 mg/0.3 mL IJ SOAJ  injection SMARTSIG:0.3 Milligram(s) IM Once PRN   famotidine (PEPCID) 20 MG tablet TAKE 1 TABLET BY MOUTH EVERYDAY AT BEDTIME   hydrALAZINE (APRESOLINE) 50 MG tablet Take 50 mg by mouth 3 (three) times daily.   levocetirizine (XYZAL) 5 MG tablet TAKE 1 TABLET BY MOUTH EVERY DAY IN THE EVENING   montelukast (SINGULAIR) 10 MG tablet TAKE 1 TABLET BY MOUTH EVERYDAY AT BEDTIME   nystatin (MYCOSTATIN/NYSTOP) powder APPLY 1 APPLICATION TOPICALLY TWICE A DAY AS NEEDED   nystatin cream (MYCOSTATIN) Apply 1 Application topically 2 (two) times daily.   Olopatadine HCl 0.2 % SOLN Apply 1 drop to eye daily.   pantoprazole  (PROTONIX) 40 MG tablet Take 1 tablet by mouth daily   Semaglutide, 1 MG/DOSE, (OZEMPIC, 1 MG/DOSE,) 4 MG/3ML SOPN INJECT 1MG  INTO THE SKIN ONCE A WEEK   spironolactone (ALDACTONE) 50 MG tablet Take 1 tablet (50 mg total) by mouth daily.     Allergies:   Honey bee treatment [bee venom] and Wheat bran   Social History   Socioeconomic History   Marital status: Single    Spouse name: Not on file   Number of children: Not on file   Years of education: Not on file   Highest education level: Not on file  Occupational History   Occupation: for Youth worker    Employer: [employer]  Tobacco Use   Smoking status: Former    Packs/day: 0.25    Years: 10.00    Total pack years: 2.50    Types: Cigarettes    Quit date: 01/26/2018    Years since quitting: 4.5   Smokeless tobacco: Former  03/28/2018 Use: Never used  Substance and Sexual Activity   Alcohol use: No   Drug use: No   Sexual activity: Never  Other Topics Concern   Not on file  Social History Narrative   Lives with two children.     Social Determinants of Health   Financial Resource Strain: Not on file  Food Insecurity: Not on file  Transportation Needs: Not on file  Physical Activity: Not on file  Stress: Not on file  Social Connections: Not on file     Family History: The patient's family history includes Anemia in her father; Diabetes in her mother; Heart disease in her mother; Hypertension in her father and mother; Obesity in her mother; Sleep apnea in her mother. There is no history of Breast cancer.  ROS:   Please see the history of present illness.    Review of Systems  Constitutional:  Negative for chills and fever.  HENT:  Negative for hearing loss and nosebleeds.   Eyes:  Negative for blurred vision and pain.  Respiratory:  Positive for cough. Negative for sputum production.   Cardiovascular:  Positive for palpitations and leg swelling. Negative for chest pain, orthopnea, claudication and PND.   Gastrointestinal:  Negative for nausea and vomiting.  Genitourinary:  Negative for hematuria.  Musculoskeletal:  Positive for back pain and neck pain. Negative for falls.  Neurological:  Negative for dizziness and loss of consciousness.  Psychiatric/Behavioral:  Negative for suicidal ideas. The patient is not nervous/anxious.      EKGs/Labs/Other Studies Reviewed:    The following studies were reviewed today:  TTE 08/21/21: IMPRESSIONS    1. Left ventricular ejection fraction, by estimation, is 65 to 70%. The  left ventricle has normal function. The left ventricle has no regional  wall motion abnormalities. Left ventricular diastolic parameters were  normal.   2. Right ventricular systolic function is normal. The right ventricular  size is normal. There is normal pulmonary artery systolic pressure.   3. The mitral valve is normal in structure. No evidence of mitral valve  regurgitation.   4. The aortic valve is normal in structure. Aortic valve regurgitation is  not visualized. No aortic stenosis is present.   Comparison(s): 11/03/20 EF 55-60%. Ascending aorta 16mm.   Conclusion(s)/Recommendation(s): Normal study.   Cardiac Monitor 07/2021: Predominant rhythm showed normal sinus rhythm with average heart rate 76 bpm. The heart rate ranged from 50 to 125 bpm. Rare PVCs with PVC load less than 1% PACs and nonsustained atrial tachycardia with longest run lasting 7 beats at 171 bpm     Patch Wear Time:  9 days and 20 hours (2022-10-04T10:08:01-399 to 2022-10-14T06:14:35-0400)   Patient had a min HR of 50 bpm, max HR of 171 bpm, and avg HR of 76 bpm. Predominant underlying rhythm was Sinus Rhythm. 2 Supraventricular Tachycardia runs occurred, the run with the fastest interval lasting 7 beats with a max rate of 171 bpm (avg 151  bpm); the run with the fastest interval was also the longest. Isolated SVEs were rare (<1.0%), SVE Couplets were rare (<1.0%), and SVE Triplets were rare  (<1.0%). Isolated VEs were rare (<1.0%), and no VE Couplets or VE Triplets were present  MRI 08/10/21: FINDINGS: 1. Normal left ventricular size, with LVEDD 55 mm, and LVEDVi 77 mL/m2.   Normal left ventricular thickness, with intraventricular septal thickness of 12 mm, posterior wall thickness of 6 mm, and myocardial mass index of 51 g/m2.   Normal left ventricular systolic function (LVEF =56%). There are no regional wall motion abnormalities.   Left ventricular parametric mapping notable for normal ECV and T2 signal.   There is no late gadolinium enhancement in the left ventricular myocardium.   2. Normal right ventricular size with RVEDVI 70 mL/m2.   Normal right ventricular thickness.   Normal right ventricular systolic function (RVEF = 56%). There are no regional wall motion abnormalities or aneurysms.   3.  Normal left and right atrial size.   4. Aortic MRA: The great vessels arose normally from the arch.   There was no evidence of coarctation.   The following measurements were obtained:   Sinus of Valsalva: 26 mm   Sinotubular Junction: 25 mm   Ascending Aorta: 39 mm; this is at the upper limits of normal for age and body surface area.   Aortic Arch: 25 mm   Descending Thoracic Aorta: 25 mm   Moderate to severe dilation of the main pulmonary artery 35 mm with trunk to aorta ratio of 0.9. This can be associated with the presence of pulmonary hypertension; clinical correlation advised.   5.  No significant valvular abnormalities.   Tri-leaflet aortic valve.   No significant different in LV and RV stroke volume.   6. Normal pericardium. No pericardial effusion. Prominent pericardial fat anterior the right ventricle   7. Grossly, no extracardiac findings. Recommended dedicated study if concerned for non-cardiac pathology.   IMPRESSION: Ascending Aorta is at the upper limit of normal.   Moderate to severe dilation of the main pulmonary artery 35 mm  with trunk to aorta ratio of 0.9. This can be associated with the presence of pulmonary hypertension.  EKG:  EKG is personally reviewed. 08/09/2022: EKG was not ordered. 02/05/2022 Arnette Felts, FNP):  EKG done with SR HR   Recent Labs: 02/05/2022: ALT 18; Hemoglobin 12.6;  Platelets 271 06/17/2022: BUN 12; Creatinine, Ser 0.82; Potassium 4.8; Sodium 148   Recent Lipid Panel    Component Value Date/Time   CHOL 185 02/05/2022 1632   TRIG 99 02/05/2022 1632   HDL 57 02/05/2022 1632   CHOLHDL 3.2 02/05/2022 1632   LDLCALC 110 (H) 02/05/2022 1632     Risk Assessment/Calculations:           Physical Exam:    VS:  BP (!) 142/80   Pulse 72   Ht 5\' 3"  (1.6 m)   Wt 276 lb 12.8 oz (125.6 kg)   LMP 12/19/2005   SpO2 94%   BMI 49.03 kg/m     Wt Readings from Last 3 Encounters:  08/09/22 276 lb 12.8 oz (125.6 kg)  06/17/22 278 lb 9.6 oz (126.4 kg)  02/08/22 270 lb 12.8 oz (122.8 kg)     GEN:  Well nourished, well developed in no acute distress HEENT: Normal NECK: No JVD; No carotid bruits CARDIAC: RRR, no murmurs, rubs, gallops RESPIRATORY:  Clear to auscultation without rales, wheezing or rhonchi  ABDOMEN: Soft, non-tender, non-distended MUSCULOSKELETAL:  No edema; No deformity  SKIN: Warm and dry NEUROLOGIC:  Alert and oriented x 3 PSYCHIATRIC:  Normal affect   ASSESSMENT:    1. Chronic diastolic heart failure (HCC)   2. Ascending aorta dilatation (HCC)   3. Chronic diastolic CHF (congestive heart failure) (HCC)   4. Essential hypertension   5. Morbid obesity due to excess calories (HCC)   6. Syncope and collapse   7. OSA on CPAP    PLAN:    In order of problems listed above:  #Chronic Diastolic HF: Doing well and euvolemic. -Continue spironolactone 50mg  daily -Not on lasix and euvolemic -BP control as below  #HTN: Elevated 140s at home. -Continue hydralazine 50mg  TID -Continue spironolactone 50mg  daily -Increase amlodipine to 10mg  daily  #OSA Her  CPAP mask is broken but she is working on getting a replacement. Discussed it is important to resume it.  -Follow-up with Armanda Magicraci Turner as scheduled  #Syncope: No recurrence. Had episode of syncope at church in the setting of not eating breakfast of not eating. Echo 11/03/20 normal LVEF, no significant valvular abnormalities, mildly dilated ascending aorta 40mm. Cardiac monitor with rare ectopy with nonsustained atach episode lasting 7 beats. Otherwise no significant changes. -Continue to monitor -Maintain good hydration  #Thoracic Aortic Aneurysm Measures 39mm on MRI 07/2021. -Continue serial monitoring with TTE  #Morbid Obesity: BMI 49. Discussed diet and weight loss at length. She is also written for ozempic.   Exercise recommendations: Goal of exercising for at least 30 minutes a day, at least 5 times per week.  Please exercise to a moderate exertion.  This means that while exercising it is difficult to speak in full sentences, however you are not so short of breath that you feel you must stop, and not so comfortable that you can carry on a full conversation.  Exertion level should be approximately a 5/10, if 10 is the most exertion you can perform.  Diet recommendations: Recommend a heart healthy diet such as the Mediterranean diet.  This diet consists of plant based foods, healthy fats, lean meats, olive oil.  It suggests limiting the intake of simple carbohydrates such as white breads, pastries, and pastas.  It also limits the amount of red meat, wine, and dairy products such as cheese that one should consume on a daily basis.       Follow-up:  6 months.  Medication Adjustments/Labs and Tests Ordered: Current medicines are reviewed at length with the patient today.  Concerns regarding medicines are outlined above.   Orders Placed This Encounter  Procedures   ECHOCARDIOGRAM COMPLETE   Meds ordered this encounter  Medications   amLODipine (NORVASC) 10 MG tablet    Sig: Take 1  tablet (10 mg total) by mouth daily.    Dispense:  90 tablet    Refill:  3    DOSE INCREASE   Patient Instructions  Medication Instructions:   INCREASE YOUR AMLODIPINE TO 10 MG BY MOUTH DAILY  *If you need a refill on your cardiac medications before your next appointment, please call your pharmacy*   Testing/Procedures:  Your physician has requested that you have an echocardiogram. Echocardiography is a painless test that uses sound waves to create images of your heart. It provides your doctor with information about the size and shape of your heart and how well your heart's chambers and valves are working. This procedure takes approximately one hour. There are no restrictions for this procedure. Please do NOT wear cologne, perfume, aftershave, or lotions (deodorant is allowed). Please arrive 15 minutes prior to your appointment time.   Follow-Up: At [facility], you and your health needs are our priority.  As part of our continuing mission to provide you with exceptional heart care, we have created designated Provider Care Teams.  These Care Teams include your primary Cardiologist (physician) and Advanced Practice Providers (APPs -  Physician Assistants and Nurse Practitioners) who all work together to provide you with the care you need, when you need it.  We recommend signing up for the patient portal called "MyChart".  Sign up information is provided on this After Visit Summary.  MyChart is used to connect with patients for Virtual Visits (Telemedicine).  Patients are able to view lab/test results, encounter notes, upcoming appointments, etc.  Non-urgent messages can be sent to your provider as well.   To learn more about what you can do with MyChart, go to [email or web address].    Your next appointment:   6 month(s)  The format for your next appointment:   In Person  Provider:   Meriam Sprague, MD      Important Information About Sugar        I,Mathew  Stumpf,acting as a scribe for Meriam Sprague, MD.,have documented all relevant documentation on the behalf of Meriam Sprague, MD,as directed by  Meriam Sprague, MD while in the presence of Meriam Sprague, MD.  I, Meriam Sprague, MD, have reviewed all documentation for this visit. The documentation on 08/09/22 for the exam, diagnosis, procedures, and orders are all accurate and complete.    Signed, Meriam Sprague, MD  08/09/2022 9:13 AM    [facility]

## 2022-08-14 ENCOUNTER — Encounter: Payer: Self-pay | Admitting: Allergy

## 2022-08-14 ENCOUNTER — Ambulatory Visit: Payer: BC Managed Care – PPO | Admitting: Allergy

## 2022-08-14 VITALS — BP 144/80 | HR 89 | Temp 98.1°F | Resp 16 | Wt 278.0 lb

## 2022-08-14 DIAGNOSIS — J4540 Moderate persistent asthma, uncomplicated: Secondary | ICD-10-CM | POA: Diagnosis not present

## 2022-08-14 DIAGNOSIS — J3089 Other allergic rhinitis: Secondary | ICD-10-CM

## 2022-08-14 DIAGNOSIS — T7800XA Anaphylactic reaction due to unspecified food, initial encounter: Secondary | ICD-10-CM

## 2022-08-14 DIAGNOSIS — H1013 Acute atopic conjunctivitis, bilateral: Secondary | ICD-10-CM

## 2022-08-14 MED ORDER — OLOPATADINE HCL 0.2 % OP SOLN: 1.0000 [drp] | Freq: Every day | OPHTHALMIC | 5 refills | Status: DC

## 2022-08-14 MED ORDER — ALBUTEROL SULFATE HFA 108 (90 BASE) MCG/ACT IN AERS: INHALATION_SPRAY | RESPIRATORY_TRACT | 1 refills | Status: DC

## 2022-08-14 MED ORDER — BREZTRI AEROSPHERE 160-9-4.8 MCG/ACT IN AERO: 2.0000 | INHALATION_SPRAY | Freq: Two times a day (BID) | RESPIRATORY_TRACT | 5 refills | Status: DC

## 2022-08-14 MED ORDER — LEVOCETIRIZINE DIHYDROCHLORIDE 5 MG PO TABS: ORAL_TABLET | ORAL | 1 refills | Status: DC

## 2022-08-14 MED ORDER — MONTELUKAST SODIUM 10 MG PO TABS: ORAL_TABLET | ORAL | 1 refills | Status: DC

## 2022-08-14 NOTE — Progress Notes (Signed)
Follow-up Note  RE: Jessica Snow MRN: [id number] DOB: [birth year] Date of Office Visit: 08/14/2022   History of present illness: Jessica Snow is a 56 y.o. female presenting today for follow-up of cough variant asthma, allergic rhinoconjunctivitis and food allergy.  She was last seen in the office on 02/08/2022 by myself.  Thus far she is noticing more wheezing and shortness of breath.  She states she is needing to use her albuterol couple times a week about 2-3 times for relief of symptoms.  She is taking her Breztri inhaler 2 puffs twice a day with spacer device.  She also takes Singulair daily.  She has not had any ED or urgent care visits or systemic steroid needs since the last visit. With her allergies she is reporting more throat itch and dryness leading to nosebleeds when she blows her nose.  She has been using the Flonase with Astelin combination spray twice a day.  She has been taking Xyzal once a day.  She states she will use her antihistamine based eyedrop as needed but her eye doctor gave her a dry eyedrop to use for dry eye symptoms. She continues to avoid wheat and honey in the diet.  She has not needed to use her epinephrine device.    Review of systems: Review of Systems  Constitutional: Negative.   HENT: Negative.         HPI  Eyes: Negative.   Respiratory: Negative.         See HPI  Cardiovascular: Negative.   Gastrointestinal: Negative.   Musculoskeletal: Negative.   Skin: Negative.   Allergic/Immunologic: Negative.   Neurological: Negative.      All other systems negative unless noted above in HPI  Past medical/social/surgical/family history have been reviewed and are unchanged unless specifically indicated below.  No changes  Medication List: Current Outpatient Medications  Medication Sig Dispense Refill   albuterol (VENTOLIN HFA) 108 (90 Base) MCG/ACT inhaler TAKE 2 PUFFS BY MOUTH EVERY 4 TO 6 HOURS AS NEEDED 18 g 3   amLODipine (NORVASC) 10 MG  tablet Take 1 tablet (10 mg total) by mouth daily. 90 tablet 3   Azelastine-Fluticasone 137-50 MCG/ACT SUSP Place 1 spray into the nose 2 (two) times daily. 23 g 5   Budeson-Glycopyrrol-Formoterol (BREZTRI AEROSPHERE) 160-9-4.8 MCG/ACT AERO Inhale 2 puffs into the lungs in the morning and at bedtime. 10.7 g 5   EPINEPHrine 0.3 mg/0.3 mL IJ SOAJ injection SMARTSIG:0.3 Milligram(s) IM Once PRN 1 each 2   famotidine (PEPCID) 20 MG tablet TAKE 1 TABLET BY MOUTH EVERYDAY AT BEDTIME 90 tablet 0   hydrALAZINE (APRESOLINE) 50 MG tablet Take 50 mg by mouth 3 (three) times daily.     levocetirizine (XYZAL) 5 MG tablet TAKE 1 TABLET BY MOUTH EVERY DAY IN THE EVENING 90 tablet 1   montelukast (SINGULAIR) 10 MG tablet TAKE 1 TABLET BY MOUTH EVERYDAY AT BEDTIME 90 tablet 0   nystatin (MYCOSTATIN/NYSTOP) powder APPLY 1 APPLICATION TOPICALLY TWICE A DAY AS NEEDED 30 g 1   nystatin cream (MYCOSTATIN) Apply 1 Application topically 2 (two) times daily. 30 g 2   Olopatadine HCl 0.2 % SOLN Apply 1 drop to eye daily. 2.5 mL 5   pantoprazole (PROTONIX) 40 MG tablet Take 1 tablet by mouth daily 180 tablet 0   Semaglutide, 1 MG/DOSE, (OZEMPIC, 1 MG/DOSE,) 4 MG/3ML SOPN INJECT 1MG  INTO THE SKIN ONCE A WEEK 3 mL 3   spironolactone (ALDACTONE) 50 MG tablet Take 1  tablet (50 mg total) by mouth daily. 90 tablet 3   No current facility-administered medications for this visit.     Known medication allergies: Allergies  Allergen Reactions   Honey Bee Treatment [Bee Venom]    Wheat Bran      Physical examination: Blood pressure (!) 144/80, pulse 89, temperature 98.1 F (36.7 C), temperature source Temporal, resp. rate 16, weight 278 lb (126.1 kg), last menstrual period 12/19/2005, SpO2 96 %.  General: Alert, interactive, in no acute distress. HEENT: PERRLA, TMs pearly gray, turbinates mildly edematous without discharge problem mucosa, post-pharynx non erythematous. Neck: Supple without lymphadenopathy. Lungs: Clear  to auscultation without wheezing, rhonchi or rales. {no increased work of breathing. CV: Normal S1, S2 without murmurs. Abdomen: Nondistended, nontender. Skin: Warm and dry, without lesions or rashes. Extremities:  No clubbing, cyanosis or edema. Neuro:   Grossly intact.  Diagnositics/Labs: None today  Assessment and plan: Cough variant asthma  - continue Breztri 2 puffs twice a day and use with spacer device.    - have access to albuterol inhaler 2 puffs every 4-6 hours as needed for cough/wheeze/shortness of breath/chest tightness.  May use 15-20 minutes prior to activity.   Monitor frequency of use.    - continue Singulair 10mg  daily at bedtime  - will obtain eosinophil count to help determine if you may be eligible for an asthma controlling injectable medication  Asthma control goals:  Full participation in all desired activities (may need albuterol before activity) Albuterol use two time or less a week on average (not counting use with activity) Cough interfering with sleep two time or less a month Oral steroids no more than once a year No hospitalizations  Allergic rhinitis with conjunctivitis  - continue allergen avoidance measures for tree pollen and mold  - continue Xyzal 5mg  daily and recommend at this time take 2 tabs daily  - continue singulair as above  - hold this nasal regimen for now due to dry nose and nose bleeds. Flonase + Astelin (nasal antihistamine) 1 spray each nostril twice a day.  This helps with both nasal congestion and drainage.   - start use of nasal saline spray to help moisturize nose and decrease risk of nose bleeds  - use Pataday 1 drop each eye daily as needed for itchy/watery eyes  - recommend after getting in from being outside to wash/wipe of face and eyes  Food allergy vs intolerance  - you have had serum IgE detectable levels to wheat and honey however they are low  - skin testing for food allergens has been negative to all foods  - continue  avoidance of honey and wheat in the diet  - have access to self-injectable epinephrine (Epipen or AuviQ) 0.3mg  at all times  - follow emergency action plan in case of allergic reaction  Follow-up in 4-6 months or sooner if needed  I appreciate the opportunity to take part in Nakenya's care. Please do not hesitate to contact me with questions.  Sincerely,   Prudy Feeler, MD Allergy/Immunology [facility]

## 2022-08-14 NOTE — Patient Instructions (Addendum)
Cough variant asthma  - continue Breztri 2 puffs twice a day and use with spacer device.    - have access to albuterol inhaler 2 puffs every 4-6 hours as needed for cough/wheeze/shortness of breath/chest tightness.  May use 15-20 minutes prior to activity.   Monitor frequency of use.    - continue Singulair 10mg  daily at bedtime  - will obtain eosinophil count to help determine if you may be eligible for an asthma controlling injectable medication  Asthma control goals:  Full participation in all desired activities (may need albuterol before activity) Albuterol use two time or less a week on average (not counting use with activity) Cough interfering with sleep two time or less a month Oral steroids no more than once a year No hospitalizations  Allergies   - continue allergen avoidance measures for tree pollen and mold  - continue Xyzal 5mg  daily and recommend at this time take 2 tabs daily  - continue singulair as above  - hold this nasal regimen for now due to dry nose and nose bleeds. Flonase + Astelin (nasal antihistamine) 1 spray each nostril twice a day.  This helps with both nasal congestion and drainage.   - start use of nasal saline spray to help moisturize nose and decrease risk of nose bleeds  - use Pataday 1 drop each eye daily as needed for itchy/watery eyes  - recommend after getting in from being outside to wash/wipe of face and eyes  Food allergy vs intolerance  - you have had serum IgE detectable levels to wheat and honey however they are low  - skin testing for food allergens has been negative to all foods  - continue avoidance of honey and wheat in the diet  - have access to self-injectable epinephrine (Epipen or AuviQ) 0.3mg  at all times  - follow emergency action plan in case of allergic reaction  Follow-up in 4-6 months or sooner if needed

## 2022-08-15 LAB — CBC WITH DIFFERENTIAL
Basophils Absolute: 0 10*3/uL (ref 0.0–0.2)
Basos: 1 %
EOS (ABSOLUTE): 0.4 10*3/uL (ref 0.0–0.4)
Eos: 4 %
Hematocrit: 39.8 % (ref 34.0–46.6)
Hemoglobin: 12.2 g/dL (ref 11.1–15.9)
Immature Grans (Abs): 0 10*3/uL (ref 0.0–0.1)
Immature Granulocytes: 0 %
Lymphocytes Absolute: 3.4 10*3/uL — ABNORMAL HIGH (ref 0.7–3.1)
Lymphs: 40 %
MCH: 24.8 pg — ABNORMAL LOW (ref 26.6–33.0)
MCHC: 30.7 g/dL — ABNORMAL LOW (ref 31.5–35.7)
MCV: 81 fL (ref 79–97)
Monocytes Absolute: 0.5 10*3/uL (ref 0.1–0.9)
Monocytes: 6 %
Neutrophils Absolute: 4.2 10*3/uL (ref 1.4–7.0)
Neutrophils: 49 %
RBC: 4.92 x10E6/uL (ref 3.77–5.28)
RDW: 15.8 % — ABNORMAL HIGH (ref 11.7–15.4)
WBC: 8.5 10*3/uL (ref 3.4–10.8)

## 2022-08-26 ENCOUNTER — Encounter: Payer: Self-pay | Admitting: *Deleted

## 2022-08-27 ENCOUNTER — Ambulatory Visit (HOSPITAL_COMMUNITY): Payer: BC Managed Care – PPO | Attending: Cardiology

## 2022-08-27 ENCOUNTER — Telehealth: Payer: Self-pay | Admitting: Cardiology

## 2022-08-27 DIAGNOSIS — I5032 Chronic diastolic (congestive) heart failure: Secondary | ICD-10-CM | POA: Insufficient documentation

## 2022-08-27 DIAGNOSIS — I351 Nonrheumatic aortic (valve) insufficiency: Secondary | ICD-10-CM

## 2022-08-27 DIAGNOSIS — I77810 Thoracic aortic ectasia: Secondary | ICD-10-CM

## 2022-08-27 LAB — ECHOCARDIOGRAM COMPLETE
Area-P 1/2: 4.83 cm2
P 1/2 time: 386 msec
S' Lateral: 3.2 cm

## 2022-08-27 MED ORDER — PERFLUTREN LIPID MICROSPHERE
1.0000 mL | INTRAVENOUS | Status: AC | PRN
  Administered 2022-08-27: 2 mL via INTRAVENOUS

## 2022-08-27 NOTE — Telephone Encounter (Signed)
Pt is returning call for results. Requesting call back.  

## 2022-08-27 NOTE — Telephone Encounter (Signed)
The patient has been notified of the result and verbalized understanding.  All questions (if any) were answered.  Pt aware that I will go ahead and place the order for repeat echo in one year in the system and send a message to our echo scheduler to call her back and arrange this appt.   Pt verbalized understanding and agrees with this plan.

## 2022-08-27 NOTE — Telephone Encounter (Signed)
-----   Message from Freada Bergeron, MD sent at 08/27/2022  1:10 PM EDT ----- Her echo shows normal pumping function. She has mild leakiness of her aortic valve and mild dilation of her aorta which is stable from her prior echo. We just need to make sure her blood pressure is well controlled to prevent these from worsening. Will continue with yearly echoes for monitoring.

## 2022-09-16 ENCOUNTER — Ambulatory Visit (INDEPENDENT_AMBULATORY_CARE_PROVIDER_SITE_OTHER): Payer: BC Managed Care – PPO | Admitting: *Deleted

## 2022-09-16 DIAGNOSIS — J4550 Severe persistent asthma, uncomplicated: Secondary | ICD-10-CM | POA: Diagnosis not present

## 2022-09-16 MED ORDER — BENRALIZUMAB 30 MG/ML ~~LOC~~ SOSY
30.0000 mg | PREFILLED_SYRINGE | Freq: Once | SUBCUTANEOUS | Status: AC
  Administered 2022-09-16: 30 mg via SUBCUTANEOUS

## 2022-09-16 NOTE — Progress Notes (Signed)
Immunotherapy   Patient Details  Name: Jessica Snow MRN: [id number] Date of Birth: 02-29-1966  09/16/2022  Jessica Snow started injections for  Fasenra   Frequency: Every 4 Weeks X3, Then every 8 weeks  Epi-Pen: Not Required  Consent signed and patient instructions given. Patient Jessica Snow today and received 3mL injection in the RUA. Patient waited 30 minutes and did not experience any issues.    Jessica Snow 09/16/2022, 9:54 AM

## 2022-09-17 ENCOUNTER — Ambulatory Visit: Payer: BC Managed Care – PPO | Admitting: Internal Medicine

## 2022-10-14 ENCOUNTER — Ambulatory Visit (INDEPENDENT_AMBULATORY_CARE_PROVIDER_SITE_OTHER): Payer: BC Managed Care – PPO

## 2022-10-14 DIAGNOSIS — J4550 Severe persistent asthma, uncomplicated: Secondary | ICD-10-CM

## 2022-10-14 MED ORDER — BENRALIZUMAB 30 MG/ML ~~LOC~~ SOSY
30.0000 mg | PREFILLED_SYRINGE | Freq: Once | SUBCUTANEOUS | Status: AC
  Administered 2022-10-14: 30 mg via SUBCUTANEOUS

## 2022-11-11 ENCOUNTER — Ambulatory Visit: Payer: BC Managed Care – PPO

## 2022-11-14 ENCOUNTER — Ambulatory Visit (INDEPENDENT_AMBULATORY_CARE_PROVIDER_SITE_OTHER): Payer: BC Managed Care – PPO

## 2022-11-14 ENCOUNTER — Other Ambulatory Visit: Payer: Self-pay | Admitting: Allergy

## 2022-11-14 DIAGNOSIS — J4550 Severe persistent asthma, uncomplicated: Secondary | ICD-10-CM

## 2022-11-14 MED ORDER — BENRALIZUMAB 30 MG/ML ~~LOC~~ SOSY
30.0000 mg | PREFILLED_SYRINGE | SUBCUTANEOUS | Status: AC
  Administered 2022-11-14 – 2023-01-15 (×2): 30 mg via SUBCUTANEOUS

## 2022-11-20 ENCOUNTER — Encounter: Payer: BC Managed Care – PPO | Admitting: Internal Medicine

## 2022-11-20 NOTE — Progress Notes (Deleted)
Jessica Snow,acting as a Education administrator for Jessica Greenland, MD.,have documented all relevant documentation on the behalf of Jessica Greenland, MD,as directed by  Jessica Greenland, MD while in the presence of Jessica Greenland, MD.    Subjective:     Patient ID: Jessica Snow , female    DOB: 03/26/1966 , 57 y.o.   MRN: [id number]   Chief Complaint  Patient presents with   Hypertension   Diabetes    HPI  The patient is here today for a follow-up on diabetes and HTN. She reports compliance with meds.      Hypertension This is a chronic problem. The current episode started more than 1 year ago. The problem has been gradually improving since onset. The problem is uncontrolled. Pertinent negatives include no blurred vision, chest pain, palpitations or shortness of breath. Past treatments include angiotensin blockers and diuretics. The current treatment provides mild improvement. Compliance problems include exercise.   Diabetes She presents for her follow-up diabetic visit. She has type 2 diabetes mellitus. There are no hypoglycemic associated symptoms. There are no diabetic associated symptoms. Pertinent negatives for diabetes include no blurred vision, no chest pain, no polydipsia, no polyphagia, no polyuria and no weakness. There are no hypoglycemic complications. Risk factors for coronary artery disease include diabetes mellitus, obesity, hypertension and sedentary lifestyle.     Past Medical History:  Diagnosis Date   Ascending aorta dilatation (HCC)    Asthma    Chronic diastolic CHF (congestive heart failure) (HCC)    Chronic rhinitis    Cough    Dilation of pulmonary artery (HCC)    Edema, lower extremity    GERD (gastroesophageal reflux disease)    HTN (hypertension)    Mild aortic insufficiency    Morbid obesity ([place])    Murmur    Pneumonia    Pre-diabetes      Family History  Problem Relation Age of Onset   Hypertension Mother    Diabetes Mother    Heart disease  Mother    Sleep apnea Mother    Obesity Mother    Hypertension Father    Anemia Father    Breast cancer Neg Hx      Current Outpatient Medications:    albuterol (VENTOLIN HFA) 108 (90 Base) MCG/ACT inhaler, INHALE 2 PUFFS BY MOUTH EVERY 4 TO 6 HOURS AS NEEDED, Disp: 18 each, Rfl: 1   amLODipine (NORVASC) 10 MG tablet, Take 1 tablet (10 mg total) by mouth daily., Disp: 90 tablet, Rfl: 3   Azelastine-Fluticasone 137-50 MCG/ACT SUSP, Place 1 spray into the nose 2 (two) times daily., Disp: 23 g, Rfl: 5   Budeson-Glycopyrrol-Formoterol (BREZTRI AEROSPHERE) 160-9-4.8 MCG/ACT AERO, Inhale 2 puffs into the lungs in the morning and at bedtime., Disp: 10.7 g, Rfl: 5   EPINEPHrine 0.3 mg/0.3 mL IJ SOAJ injection, SMARTSIG:0.3 Milligram(s) IM Once PRN, Disp: 1 each, Rfl: 2   famotidine (PEPCID) 20 MG tablet, TAKE 1 TABLET BY MOUTH EVERYDAY AT BEDTIME, Disp: 90 tablet, Rfl: 0   hydrALAZINE (APRESOLINE) 50 MG tablet, Take 50 mg by mouth 3 (three) times daily., Disp: , Rfl:    levocetirizine (XYZAL) 5 MG tablet, TAKE 1 TABLET BY MOUTH EVERY DAY IN THE EVENING, Disp: 90 tablet, Rfl: 1   montelukast (SINGULAIR) 10 MG tablet, TAKE 1 TABLET BY MOUTH EVERYDAY AT BEDTIME, Disp: 90 tablet, Rfl: 1   nystatin (MYCOSTATIN/NYSTOP) powder, APPLY 1 APPLICATION TOPICALLY TWICE A DAY AS NEEDED, Disp: 30 g, Rfl: 1  nystatin cream (MYCOSTATIN), Apply 1 Application topically 2 (two) times daily., Disp: 30 g, Rfl: 2   Olopatadine HCl 0.2 % SOLN, Apply 1 drop to eye daily., Disp: 2.5 mL, Rfl: 5   pantoprazole (PROTONIX) 40 MG tablet, Take 1 tablet by mouth daily, Disp: 180 tablet, Rfl: 0   Semaglutide, 1 MG/DOSE, (OZEMPIC, 1 MG/DOSE,) 4 MG/3ML SOPN, INJECT 1MG  INTO THE SKIN ONCE A WEEK, Disp: 3 mL, Rfl: 3   spironolactone (ALDACTONE) 50 MG tablet, Take 1 tablet (50 mg total) by mouth daily., Disp: 90 tablet, Rfl: 3  Current Facility-Administered Medications:    Benralizumab SOSY 30 mg, 30 mg, Subcutaneous, Q8 weeks,  Padgett, Rae Halsted, MD, 30 mg at 11/14/22 8338   Allergies  Allergen Reactions   Honey Bee Treatment [Bee Venom]    Wheat Bran      Review of Systems  Constitutional: Negative.   Eyes: Negative.  Negative for blurred vision.  Respiratory:  Negative for shortness of breath.   Cardiovascular:  Negative for chest pain and palpitations.  Endocrine: Negative for polydipsia, polyphagia and polyuria.  Musculoskeletal: Negative.   Skin: Negative.   Neurological:  Negative for weakness.  Psychiatric/Behavioral: Negative.       There were no vitals filed for this visit. There is no height or weight on file to calculate BMI.   Objective:  Physical Exam      Assessment And Plan:     There are no diagnoses linked to this encounter.    Patient was given opportunity to ask questions. Patient verbalized understanding of the plan and was able to repeat key elements of the plan. All questions were answered to their satisfaction.  Sheppard Evens Snow, CMA   I, [place], CMA, have reviewed all documentation for this visit. The documentation on 11/20/22 for the exam, diagnosis, procedures, and orders are all accurate and complete.   IF YOU HAVE BEEN REFERRED TO A SPECIALIST, IT MAY TAKE 1-2 WEEKS TO SCHEDULE/PROCESS THE REFERRAL. IF YOU HAVE NOT HEARD FROM US/SPECIALIST IN TWO WEEKS, PLEASE GIVE [place] A CALL AT [phone].   THE PATIENT IS ENCOURAGED TO PRACTICE SOCIAL DISTANCING DUE TO THE COVID-19 PANDEMIC.

## 2022-11-23 NOTE — Progress Notes (Signed)
No show

## 2023-01-15 ENCOUNTER — Encounter: Payer: Self-pay | Admitting: Allergy

## 2023-01-15 ENCOUNTER — Ambulatory Visit: Payer: BC Managed Care – PPO

## 2023-01-15 ENCOUNTER — Ambulatory Visit (INDEPENDENT_AMBULATORY_CARE_PROVIDER_SITE_OTHER): Payer: BC Managed Care – PPO | Admitting: Allergy

## 2023-01-15 ENCOUNTER — Other Ambulatory Visit: Payer: Self-pay

## 2023-01-15 VITALS — BP 130/90 | HR 67 | Temp 98.5°F | Resp 20 | Ht 63.0 in | Wt 273.1 lb

## 2023-01-15 DIAGNOSIS — T7800XA Anaphylactic reaction due to unspecified food, initial encounter: Secondary | ICD-10-CM

## 2023-01-15 DIAGNOSIS — H1013 Acute atopic conjunctivitis, bilateral: Secondary | ICD-10-CM | POA: Diagnosis not present

## 2023-01-15 DIAGNOSIS — J3089 Other allergic rhinitis: Secondary | ICD-10-CM

## 2023-01-15 DIAGNOSIS — T7800XD Anaphylactic reaction due to unspecified food, subsequent encounter: Secondary | ICD-10-CM

## 2023-01-15 DIAGNOSIS — J4550 Severe persistent asthma, uncomplicated: Secondary | ICD-10-CM | POA: Diagnosis not present

## 2023-01-15 MED ORDER — AZELASTINE HCL 0.05 % OP SOLN: 1.0000 [drp] | Freq: Two times a day (BID) | OPHTHALMIC | 5 refills | Status: DC | PRN

## 2023-01-15 MED ORDER — EPINEPHRINE 0.3 MG/0.3ML IJ SOAJ: INTRAMUSCULAR | 2 refills | Status: DC

## 2023-01-15 NOTE — Progress Notes (Signed)
Follow-up Note  RE: Jessica Snow MRN: [id number] DOB: June 06, 1966 Date of Office Visit: 01/15/2023   History of present illness: Jessica Snow is a 57 y.o. female presenting today for follow-up of cough variant asthma, allergic rhinitis with conjunctivitis as well as food allergy.  She was last seen in the office on 08/14/2022 by myself.  Since this visit she started Fasenra injections in Nov 2023 and has had her 3 monthly loading doses thus far.  Does she is now in the maintenance 8-week phase.  She states she does feel better and that her breathing is improved since being on Fasenra.  She states she has noticed less the use of her albuterol.  She still notes some shortness of breath but this is less as well.  She is using her Breztri 2 puffs twice a day with spacer as well as Singulair daily.  She has not had any ED or urgent care visits for breathing related issues nor systemic steroids since her last visit. With her allergies she is reporting itchy and watery eyes this past week.  She is using Pataday but is not sure if it is helpful much.  She is using nasal saline as she has had nosebleeds with previous steroid nasal sprays.  She does report some congestion and drainage however.  She does continue to take Xyzal and feels like it is still effective. She continues to avoid wheat and honey products in the diet.  She has access to an epinephrine device that she has not needed to use.  Review of systems: Review of Systems  Constitutional: Negative.   HENT:         See HPI  Eyes:        See HPI  Respiratory: Negative.    Cardiovascular: Negative.   Gastrointestinal: Negative.   Musculoskeletal: Negative.   Skin: Negative.   Allergic/Immunologic: Negative.   Neurological: Negative.      All other systems negative unless noted above in HPI  Past medical/social/surgical/family history have been reviewed and are unchanged unless specifically indicated below.  No  changes  Medication List: Current Outpatient Medications  Medication Sig Dispense Refill   albuterol (VENTOLIN HFA) 108 (90 Base) MCG/ACT inhaler INHALE 2 PUFFS BY MOUTH EVERY 4 TO 6 HOURS AS NEEDED 18 each 1   amLODipine (NORVASC) 10 MG tablet Take 1 tablet (10 mg total) by mouth daily. 90 tablet 3   azelastine (OPTIVAR) 0.05 % ophthalmic solution Place 1 drop into both eyes 2 (two) times daily as needed (itchy/watery eyes). 6 mL 5   Azelastine-Fluticasone 137-50 MCG/ACT SUSP Place 1 spray into the nose 2 (two) times daily. 23 g 5   Budeson-Glycopyrrol-Formoterol (BREZTRI AEROSPHERE) 160-9-4.8 MCG/ACT AERO Inhale 2 puffs into the lungs in the morning and at bedtime. 10.7 g 5   famotidine (PEPCID) 20 MG tablet TAKE 1 TABLET BY MOUTH EVERYDAY AT BEDTIME 90 tablet 0   hydrALAZINE (APRESOLINE) 50 MG tablet Take 50 mg by mouth 3 (three) times daily.     levocetirizine (XYZAL) 5 MG tablet TAKE 1 TABLET BY MOUTH EVERY DAY IN THE EVENING 90 tablet 1   montelukast (SINGULAIR) 10 MG tablet TAKE 1 TABLET BY MOUTH EVERYDAY AT BEDTIME 90 tablet 1   nystatin (MYCOSTATIN/NYSTOP) powder APPLY 1 APPLICATION TOPICALLY TWICE A DAY AS NEEDED 30 g 1   nystatin cream (MYCOSTATIN) Apply 1 Application topically 2 (two) times daily. 30 g 2   Olopatadine HCl 0.2 % SOLN Apply 1 drop  to eye daily. 2.5 mL 5   pantoprazole (PROTONIX) 40 MG tablet Take 1 tablet by mouth daily 180 tablet 0   Semaglutide, 1 MG/DOSE, (OZEMPIC, 1 MG/DOSE,) 4 MG/3ML SOPN INJECT 1MG  INTO THE SKIN ONCE A WEEK 3 mL 3   spironolactone (ALDACTONE) 50 MG tablet Take 1 tablet (50 mg total) by mouth daily. 90 tablet 3   EPINEPHrine 0.3 mg/0.3 mL IJ SOAJ injection SMARTSIG:0.3 Milligram(s) IM Once PRN 1 each 2   Current Facility-Administered Medications  Medication Dose Route Frequency Provider Last Rate Last Admin   Benralizumab SOSY 30 mg  30 mg Subcutaneous Q8 weeks Kennith Gain, MD   30 mg at 01/15/23 [id number]     Known medication  allergies: Allergies  Allergen Reactions   Honey Bee Treatment [Bee Venom]    Wheat      Physical examination: Blood pressure (!) 130/90, pulse 67, temperature 98.5 F (36.9 C), resp. rate 20, height 5\' 3"  (1.6 m), weight 273 lb 1.6 oz (123.9 kg), last menstrual period 12/19/2005, SpO2 94 %.  General: Alert, interactive, in no acute distress. HEENT: PERRLA, TMs pearly gray, turbinates moderately edematous without discharge, post-pharynx non erythematous. Neck: Supple without lymphadenopathy. Lungs: Clear to auscultation without wheezing, rhonchi or rales. {no increased work of breathing. CV: Normal S1, S2 without murmurs. Abdomen: Nondistended, nontender. Skin: Warm and dry, without lesions or rashes. Extremities:  No clubbing, cyanosis or edema. Neuro:   Grossly intact.  Diagnositics/Labs:  Spirometry: FEV1: 1.16L 53%, FVC: 1.41L 51% predicted.  This is a reduced lung function study however it is quite stable   Assessment and plan: Asthma - control is better since starting Fasenra  - continue Breztri 2 puffs twice a day and use with spacer device.    - have access to albuterol inhaler 2 puffs every 4-6 hours as needed for cough/wheeze/shortness of breath/chest tightness.  May use 15-20 minutes prior to activity.   Monitor frequency of use.    - continue Singulair 10mg  daily at bedtime  - continue Fasenra injections every 8 weeks now  Asthma control goals:  Full participation in all desired activities (may need albuterol before activity) Albuterol use two time or less a week on average (not counting use with activity) Cough interfering with sleep two time or less a month Oral steroids no more than once a year No hospitalizations  Allergic rhinitis with conjunctivitis  - continue allergen avoidance measures for tree pollen and mold  - continue Xyzal 5mg  daily 1-2 tabs  - continue singulair as above  - continue nasal saline spray to help keep nose moisturized  - use 3  times a week (Mon, Wed, Fri) Flonase + Astelin (nasal antihistamine) 1 spray each nostril twice a day.  This helps with both nasal congestion and drainage.   - use Optivar or Bepreve 1 drop each eye twice a day as needed for itchy/watery eyes  - recommend after getting in from being outside to wash/wipe of face and eyes  Food allergy vs intolerance  - you have had serum IgE detectable levels to wheat and honey however they are low  - skin testing for food allergens has been negative to all foods  - continue avoidance of honey and wheat in the diet  - have access to self-injectable epinephrine (Epipen or AuviQ) 0.3mg  at all times  - follow emergency action plan in case of allergic reaction  Follow-up in 6 months or sooner if needed  I appreciate the opportunity to take part  in Jessica Snow's care. Please do not hesitate to contact me with questions.  Sincerely,   Prudy Feeler, MD Allergy/Immunology [facility]

## 2023-01-15 NOTE — Patient Instructions (Addendum)
Asthma - control is better since starting Fasenra  - continue Breztri 2 puffs twice a day and use with spacer device.    - have access to albuterol inhaler 2 puffs every 4-6 hours as needed for cough/wheeze/shortness of breath/chest tightness.  May use 15-20 minutes prior to activity.   Monitor frequency of use.    - continue Singulair 10mg  daily at bedtime  - continue Fasenra injections every 8 weeks now  Asthma control goals:  Full participation in all desired activities (may need albuterol before activity) Albuterol use two time or less a week on average (not counting use with activity) Cough interfering with sleep two time or less a month Oral steroids no more than once a year No hospitalizations  Allergies   - continue allergen avoidance measures for tree pollen and mold  - continue Xyzal 5mg  daily 1-2 tabs  - continue singulair as above  - continue nasal saline spray to help keep nose moisturized  - use 3 times a week (Mon, Wed, Fri) Flonase + Astelin (nasal antihistamine) 1 spray each nostril twice a day.  This helps with both nasal congestion and drainage.   - use Optivar or Bepreve 1 drop each eye twice a day as needed for itchy/watery eyes  - recommend after getting in from being outside to wash/wipe of face and eyes  Food allergy vs intolerance  - you have had serum IgE detectable levels to wheat and honey however they are low  - skin testing for food allergens has been negative to all foods  - continue avoidance of honey and wheat in the diet  - have access to self-injectable epinephrine (Epipen or AuviQ) 0.3mg  at all times  - follow emergency action plan in case of allergic reaction  Follow-up in 6 months or sooner if needed

## 2023-01-16 NOTE — Addendum Note (Signed)
Addended by: Alvin Critchley, Otha Rickles on: 01/16/2023 06:16 PM   Modules accepted: Orders

## 2023-01-23 DIAGNOSIS — I609 Nontraumatic subarachnoid hemorrhage, unspecified: Secondary | ICD-10-CM | POA: Insufficient documentation

## 2023-01-24 HISTORY — PX: ANEURYSM COILING: SHX5349

## 2023-01-30 DIAGNOSIS — J189 Pneumonia, unspecified organism: Secondary | ICD-10-CM | POA: Insufficient documentation

## 2023-02-08 ENCOUNTER — Other Ambulatory Visit: Payer: Self-pay | Admitting: Allergy

## 2023-02-10 ENCOUNTER — Other Ambulatory Visit: Payer: Self-pay | Admitting: Nurse Practitioner

## 2023-02-17 ENCOUNTER — Encounter: Payer: Self-pay | Admitting: Internal Medicine

## 2023-02-17 ENCOUNTER — Ambulatory Visit (INDEPENDENT_AMBULATORY_CARE_PROVIDER_SITE_OTHER): Payer: BC Managed Care – PPO | Admitting: Internal Medicine

## 2023-02-17 VITALS — BP 130/76 | HR 91 | Temp 98.6°F | Ht 63.0 in | Wt 262.8 lb

## 2023-02-17 DIAGNOSIS — I5032 Chronic diastolic (congestive) heart failure: Secondary | ICD-10-CM

## 2023-02-17 DIAGNOSIS — E119 Type 2 diabetes mellitus without complications: Secondary | ICD-10-CM | POA: Diagnosis not present

## 2023-02-17 DIAGNOSIS — J189 Pneumonia, unspecified organism: Secondary | ICD-10-CM | POA: Diagnosis not present

## 2023-02-17 DIAGNOSIS — I110 Hypertensive heart disease with heart failure: Secondary | ICD-10-CM

## 2023-02-17 DIAGNOSIS — Z0000 Encounter for general adult medical examination without abnormal findings: Secondary | ICD-10-CM

## 2023-02-17 DIAGNOSIS — I609 Nontraumatic subarachnoid hemorrhage, unspecified: Secondary | ICD-10-CM

## 2023-02-17 LAB — POCT URINALYSIS DIPSTICK
Bilirubin, UA: NEGATIVE
Blood, UA: NEGATIVE
Glucose, UA: NEGATIVE
Ketones, UA: NEGATIVE
Leukocytes, UA: NEGATIVE
Nitrite, UA: NEGATIVE
Protein, UA: NEGATIVE
Spec Grav, UA: 1.01 (ref 1.010–1.025)
Urobilinogen, UA: 0.2 E.U./dL
pH, UA: 6 (ref 5.0–8.0)

## 2023-02-17 MED ORDER — OZEMPIC (1 MG/DOSE) 4 MG/3ML ~~LOC~~ SOPN: PEN_INJECTOR | SUBCUTANEOUS | 3 refills | Status: DC

## 2023-02-17 NOTE — Progress Notes (Signed)
Hershal Coria Martin,acting as a Neurosurgeon for [name], MD.,have documented all relevant documentation on the behalf of [name], MD,as directed by  [name], MD while in the presence of [name], MD.   Subjective:     Patient ID: Jessica Snow , female    DOB: 1966-03-01 , 57 y.o.   MRN: [id number]   Chief Complaint  Patient presents with   Annual Exam    HPI  Patient presents today for annual exam. She reports compliance w/ meds. She has been feeling fatigued recently. She was recently discharged from [facility] on 02/07/23. She was admitted on 01/23/23. She was initially evaluated at [facility] campus for further evaluation of headache. She states she was eating and choked on some food. During her coughing spell, she immediately felt like "something was wrong".  Her Mom called EMS, she was evaluated and found to have elevated blood pressure. She was then transported to [facility] campus. ER workup revealed subarachnoid hemorrhage, she was then taken to [facility]. She states she was initially in ICU. Initial workup included: - CT Head: Large SAH in basilar cisterns as well as interhemispheric fissure. Repeat CT Head: Stable appearance of the diffuse subarachnoid hemorrhage and ventricular size/configuration. Interval development of small amount of intraventricular hemorrhage. Her Neuro symptoms eventually subsided.  However, her hospitalization was complicated by pneumonia. She states she feels fatigued since discharge. She is currently in home PT/OT.   BP Readings from Last 3 Encounters: 02/17/23 : 130/76 01/15/23 : (!) 130/90 08/14/22 : (!) 144/80    Diabetes She presents for her follow-up diabetic visit. She has type 2 diabetes mellitus. There are no hypoglycemic associated symptoms. Associated symptoms include fatigue. Pertinent negatives for diabetes include no blurred vision and no chest pain. There are no hypoglycemic complications. Risk factors for  coronary artery disease include diabetes mellitus, obesity, hypertension and sedentary lifestyle.  Hypertension This is a chronic problem. The current episode started more than 1 year ago. The problem has been gradually improving since onset. The problem is uncontrolled. Pertinent negatives include no blurred vision, chest pain, palpitations or shortness of breath. Past treatments include angiotensin blockers and diuretics. The current treatment provides mild improvement. Compliance problems include exercise.      Past Medical History:  Diagnosis Date   Ascending aorta dilatation (HCC)    Asthma    Chronic diastolic CHF (congestive heart failure) (HCC)    Chronic rhinitis    Cough    Dilation of pulmonary artery (HCC)    Edema, lower extremity    GERD (gastroesophageal reflux disease)    HTN (hypertension)    Mild aortic insufficiency    Morbid obesity (HCC)    Murmur    Pneumonia    Pre-diabetes      Family History  Problem Relation Age of Onset   Hypertension Mother    Diabetes Mother    Heart disease Mother    Sleep apnea Mother    Obesity Mother    Hypertension Father    Anemia Father    Breast cancer Neg Hx      Current Outpatient Medications:    albuterol (VENTOLIN HFA) 108 (90 Base) MCG/ACT inhaler, INHALE 2 PUFFS BY MOUTH EVERY 4 TO 6 HOURS AS NEEDED, Disp: 18 each, Rfl: 1   amLODipine (NORVASC) 10 MG tablet, Take 1 tablet (10 mg total) by mouth daily., Disp: 90 tablet, Rfl: 3   azelastine (OPTIVAR) 0.05 % ophthalmic solution, Place 1 drop  into both eyes 2 (two) times daily as needed (itchy/watery eyes)., Disp: 6 mL, Rfl: 5   Azelastine-Fluticasone 137-50 MCG/ACT SUSP, PLACE 1 SPRAY INTO THE NOSE 2 (TWO) TIMES DAILY., Disp: 23 g, Rfl: 5   Budeson-Glycopyrrol-Formoterol (BREZTRI AEROSPHERE) 160-9-4.8 MCG/ACT AERO, Inhale 2 puffs into the lungs in the morning and at bedtime., Disp: 10.7 g, Rfl: 5   EPINEPHrine 0.3 mg/0.3 mL IJ SOAJ injection, SMARTSIG:0.3 Milligram(s)  IM Once PRN, Disp: 1 each, Rfl: 2   famotidine (PEPCID) 20 MG tablet, TAKE 1 TABLET BY MOUTH EVERYDAY AT BEDTIME, Disp: 90 tablet, Rfl: 0   levocetirizine (XYZAL) 5 MG tablet, TAKE 1 TABLET BY MOUTH EVERY DAY IN THE EVENING, Disp: 90 tablet, Rfl: 1   montelukast (SINGULAIR) 10 MG tablet, TAKE 1 TABLET BY MOUTH EVERYDAY AT BEDTIME, Disp: 90 tablet, Rfl: 1   nystatin (MYCOSTATIN/NYSTOP) powder, APPLY 1 APPLICATION TOPICALLY TWICE A DAY AS NEEDED, Disp: 30 g, Rfl: 1   nystatin cream (MYCOSTATIN), Apply 1 Application topically 2 (two) times daily., Disp: 30 g, Rfl: 2   olmesartan-hydrochlorothiazide (BENICAR HCT) 20-12.5 MG tablet, Olmesartan-Hydrochlorothiazide, Disp: , Rfl:    Olopatadine HCl 0.2 % SOLN, Apply 1 drop to eye daily., Disp: 2.5 mL, Rfl: 5   pantoprazole (PROTONIX) 40 MG tablet, Take 1 tablet by mouth daily, Disp: 180 tablet, Rfl: 0   spironolactone (ALDACTONE) 50 MG tablet, TAKE 1 TABLET BY MOUTH EVERY DAY, Disp: 90 tablet, Rfl: 3   Semaglutide, 1 MG/DOSE, (OZEMPIC, 1 MG/DOSE,) 4 MG/3ML SOPN, INJECT 1MG  INTO THE SKIN ONCE A WEEK, Disp: 3 mL, Rfl: 3  Current Facility-Administered Medications:    Benralizumab SOSY 30 mg, 30 mg, Subcutaneous, Q8 weeks, Padgett, Pilar Grammes, MD, 30 mg at 01/15/23 9604   Allergies  Allergen Reactions   Honey Bee Treatment [Bee Venom]    Wheat       The patient states she uses none for birth control. Last LMP was Patient's last menstrual period was 12/19/2005.. Negative for Dysmenorrhea. Negative for: breast discharge, breast lump(s), breast pain and breast self exam. Associated symptoms include abnormal vaginal bleeding. Pertinent negatives include abnormal bleeding (hematology), anxiety, decreased libido, depression, difficulty falling sleep, dyspareunia, history of infertility, nocturia, sexual dysfunction, sleep disturbances, urinary incontinence, urinary urgency, vaginal discharge and vaginal itching. Diet regular.    . The patient's tobacco  use is:  Social History   Tobacco Use  Smoking Status Former   Packs/day: 0.25   Years: 10.00   Additional pack years: 0.00   Total pack years: 2.50   Types: Cigarettes   Quit date: 01/26/2018   Years since quitting: 5.0  Smokeless Tobacco Former  . She has been exposed to passive smoke. The patient's alcohol use is:  Social History   Substance and Sexual Activity  Alcohol Use No    Review of Systems  Constitutional:  Positive for fatigue.  HENT: Negative.    Eyes: Negative.  Negative for blurred vision.  Respiratory: Negative.  Negative for shortness of breath.   Cardiovascular: Negative.  Negative for chest pain and palpitations.  Gastrointestinal: Negative.   Endocrine: Negative.   Genitourinary: Negative.   Musculoskeletal: Negative.   Skin: Negative.   Allergic/Immunologic: Negative.   Neurological: Negative.   Hematological: Negative.   Psychiatric/Behavioral: Negative.       Today's Vitals   02/17/23 1507  BP: 130/76  Pulse: 91  Temp: 98.6 F (37 C)  TempSrc: Oral  Weight: 262 lb 12.8 oz (119.2 kg)  Height: 5\' 3"  (1.6  m)  PainSc: 7   PainLoc: Head   Body mass index is 46.55 kg/m.  Wt Readings from Last 3 Encounters:  02/17/23 262 lb 12.8 oz (119.2 kg)  01/15/23 273 lb 1.6 oz (123.9 kg)  08/14/22 278 lb (126.1 kg)    Objective:  Physical Exam Vitals and nursing note reviewed.  Constitutional:      Appearance: Normal appearance. She is obese.  HENT:     Head: Normocephalic and atraumatic.     Right Ear: Tympanic membrane, ear canal and external ear normal.     Left Ear: Tympanic membrane, ear canal and external ear normal.     Nose: Nose normal.     Mouth/Throat:     Mouth: Mucous membranes are moist.     Pharynx: Oropharynx is clear.  Eyes:     Extraocular Movements: Extraocular movements intact.     Conjunctiva/sclera: Conjunctivae normal.     Pupils: Pupils are equal, round, and reactive to light.  Cardiovascular:     Rate and Rhythm:  Normal rate and regular rhythm.     Pulses: Normal pulses.          Dorsalis pedis pulses are 2+ on the right side and 2+ on the left side.     Heart sounds: Normal heart sounds.  Pulmonary:     Effort: Pulmonary effort is normal.     Breath sounds: Normal breath sounds.  Chest:  Breasts:    Tanner Score is 5.     Right: Normal.     Left: Normal.  Abdominal:     General: Bowel sounds are normal.     Palpations: Abdomen is soft.     Comments: Obese, soft  Genitourinary:    Comments: deferred Musculoskeletal:        General: Normal range of motion.     Cervical back: Normal range of motion and neck supple.  Feet:     Right foot:     Protective Sensation: 5 sites tested.  5 sites sensed.     Skin integrity: Dry skin present.     Toenail Condition: Right toenails are long.     Left foot:     Protective Sensation: 5 sites tested.  5 sites sensed.     Skin integrity: Dry skin present.     Toenail Condition: Left toenails are long.     Comments: Scaly feet b/l Skin:    General: Skin is warm and dry.  Neurological:     General: No focal deficit present.     Mental Status: She is alert and oriented to person, place, and time.     Cranial Nerves: No cranial nerve deficit.     Sensory: No sensory deficit.  Psychiatric:        Mood and Affect: Mood normal.        Behavior: Behavior normal.      Assessment And Plan:     1. Encounter for annual health examination Comments: A full exam was performed. Importance of monthly self breast exams was discussed with the patient.  PATIENT IS ADVISED TO GET 30-45 MINUTES REGULAR EXERCISE NO LESS THAN FOUR TO FIVE DAYS PER WEEK - BOTH WEIGHTBEARING EXERCISES AND AEROBIC ARE RECOMMENDED.  PATIENT IS ADVISED TO FOLLOW A HEALTHY DIET WITH AT LEAST SIX FRUITS/VEGGIES PER DAY, DECREASE INTAKE OF RED MEAT, AND TO INCREASE FISH INTAKE TO TWO DAYS PER WEEK.  MEATS/FISH SHOULD NOT BE FRIED, BAKED OR BROILED IS PREFERABLE.  IT IS ALSO IMPORTANT TO  CUT BACK  ON YOUR SUGAR INTAKE. PLEASE AVOID ANYTHING WITH ADDED SUGAR, CORN SYRUP OR OTHER SWEETENERS. IF YOU MUST USE A SWEETENER, YOU CAN TRY STEVIA. IT IS ALSO IMPORTANT TO AVOID ARTIFICIALLY SWEETENERS AND DIET BEVERAGES. LASTLY, I SUGGEST WEARING SPF 50 SUNSCREEN ON EXPOSED PARTS AND ESPECIALLY WHEN IN THE DIRECT SUNLIGHT FOR AN EXTENDED PERIOD OF TIME.  PLEASE AVOID FAST FOOD RESTAURANTS AND INCREASE YOUR WATER INTAKE. - CMP14+EGFR - CBC - Hemoglobin A1c - Lipid panel  2. Type 2 diabetes mellitus without complication, without long-term current use of insulin (HCC) Comments: Chronic, diabetic foot exam performed. I plan to resume Ozempic, 0.25mg  weekly.  She agrees to f/u in 3 months. Dietary compliance was stressed to the patient.  I DISCUSSED WITH THE PATIENT AT LENGTH REGARDING THE GOALS OF GLYCEMIC CONTROL AND POSSIBLE LONG-TERM COMPLICATIONS.  I  ALSO STRESSED THE IMPORTANCE OF COMPLIANCE WITH HOME GLUCOSE MONITORING, DIETARY RESTRICTIONS INCLUDING AVOIDANCE OF SUGARY DRINKS/PROCESSED FOODS,  ALONG WITH REGULAR EXERCISE.  I  ALSO STRESSED THE IMPORTANCE OF ANNUAL EYE EXAMS, SELF FOOT CARE AND COMPLIANCE WITH OFFICE VISITS.  - Semaglutide, 1 MG/DOSE, (OZEMPIC, 1 MG/DOSE,) 4 MG/3ML SOPN; INJECT 1MG  INTO THE SKIN ONCE A WEEK  Dispense: 3 mL; Refill: 3  3. Hypertensive heart disease with chronic diastolic congestive heart failure (HCC) Comments: Chronic, fair control. EKG performed, NSR w/ old anteroseptal infarct. Lisinopril was discontinued in hospital, now on nimoldipine.  - POCT Urinalysis Dipstick (81002) - Microalbumin / creatinine urine ratio - EKG 12-Lead - olmesartan-hydrochlorothiazide (BENICAR HCT) 20-12.5 MG tablet; Olmesartan-Hydrochlorothiazide  4. SAH (subarachnoid hemorrhage) [facility]) Comments: Hospital d/c summary reviewed in detail.  MEDS RECONCILED AND COMPARED TO DISCHARGE MEDS. MEDICATION LIST WAS UPDATED AND REVIEWED WITH THE PATIENT. GREATER THAN 50% FACE TO FACE TIME WAS SPENT IN  COUNSELING AND COORDINATION OF CARE. ALL QUESTIONS WERE ANSWERED TO THE SATISFACTION OF THE PATIENT.   5. Pneumonia, organism unspecified([phone]) Comments: Sx have resolved w/ abx.  Patient was given opportunity to ask questions. Patient verbalized understanding of the plan and was able to repeat key elements of the plan. All questions were answered to their satisfaction.   I, [name], MD, have reviewed all documentation for this visit. The documentation on 02/23/23 for the exam, diagnosis, procedures, and orders are all accurate and complete.   THE PATIENT IS ENCOURAGED TO PRACTICE SOCIAL DISTANCING DUE TO THE COVID-19 PANDEMIC.

## 2023-02-17 NOTE — Patient Instructions (Signed)

## 2023-02-18 LAB — MICROALBUMIN / CREATININE URINE RATIO
Creatinine, Urine: 30.1 mg/dL
Microalb/Creat Ratio: <10 mg/g creat (ref 0–29)
Microalbumin, Urine: <3 ug/mL

## 2023-02-18 LAB — CMP14+EGFR
ALT: 25 IU/L (ref 0–32)
AST: 22 IU/L (ref 0–40)
Albumin/Globulin Ratio: 1.5 (ref 1.2–2.2)
Albumin: 4.2 g/dL (ref 3.8–4.9)
Alkaline Phosphatase: 99 IU/L (ref 44–121)
BUN/Creatinine Ratio: 9 (ref 9–23)
BUN: 6 mg/dL (ref 6–24)
Bilirubin Total: 0.4 mg/dL (ref 0.0–1.2)
CO2: 20 mmol/L (ref 20–29)
Calcium: 10 mg/dL (ref 8.7–10.2)
Chloride: 106 mmol/L (ref 96–106)
Creatinine, Ser: 0.65 mg/dL (ref 0.57–1.00)
Globulin, Total: 2.8 g/dL (ref 1.5–4.5)
Glucose: 97 mg/dL (ref 70–99)
Potassium: 4.1 mmol/L (ref 3.5–5.2)
Sodium: 143 mmol/L (ref 134–144)
Total Protein: 7 g/dL (ref 6.0–8.5)
eGFR: 104 mL/min/{1.73_m2} (ref >59)

## 2023-02-18 LAB — CBC
Hematocrit: 38.1 % (ref 34.0–46.6)
Hemoglobin: 12.1 g/dL (ref 11.1–15.9)
MCH: 25.3 pg — ABNORMAL LOW (ref 26.6–33.0)
MCHC: 31.8 g/dL (ref 31.5–35.7)
MCV: 80 fL (ref 79–97)
Platelets: 348 10*3/uL (ref 150–450)
RBC: 4.79 x10E6/uL (ref 3.77–5.28)
RDW: 16.4 % — ABNORMAL HIGH (ref 11.7–15.4)
WBC: 7.5 10*3/uL (ref 3.4–10.8)

## 2023-02-18 LAB — LIPID PANEL
Chol/HDL Ratio: 3.6 ratio (ref 0.0–4.4)
Cholesterol, Total: 196 mg/dL (ref 100–199)
HDL: 55 mg/dL (ref >39)
LDL Chol Calc (NIH): 113 mg/dL — ABNORMAL HIGH (ref 0–99)
Triglycerides: 160 mg/dL — ABNORMAL HIGH (ref 0–149)
VLDL Cholesterol Cal: 28 mg/dL (ref 5–40)

## 2023-02-18 LAB — HEMOGLOBIN A1C
Est. average glucose Bld gHb Est-mCnc: 143 mg/dL
Hgb A1c MFr Bld: 6.6 % — ABNORMAL HIGH (ref 4.8–5.6)

## 2023-02-19 ENCOUNTER — Other Ambulatory Visit (INDEPENDENT_AMBULATORY_CARE_PROVIDER_SITE_OTHER): Payer: BC Managed Care – PPO | Admitting: Internal Medicine

## 2023-02-19 DIAGNOSIS — J4550 Severe persistent asthma, uncomplicated: Secondary | ICD-10-CM | POA: Insufficient documentation

## 2023-02-19 DIAGNOSIS — G4733 Obstructive sleep apnea (adult) (pediatric): Secondary | ICD-10-CM | POA: Insufficient documentation

## 2023-02-19 DIAGNOSIS — J45991 Cough variant asthma: Secondary | ICD-10-CM

## 2023-02-19 DIAGNOSIS — Z6841 Body Mass Index (BMI) 40.0 and over, adult: Secondary | ICD-10-CM

## 2023-02-19 DIAGNOSIS — I5032 Chronic diastolic (congestive) heart failure: Secondary | ICD-10-CM | POA: Diagnosis not present

## 2023-02-19 DIAGNOSIS — I69098 Other sequelae following nontraumatic subarachnoid hemorrhage: Secondary | ICD-10-CM | POA: Diagnosis not present

## 2023-02-19 DIAGNOSIS — E785 Hyperlipidemia, unspecified: Secondary | ICD-10-CM

## 2023-02-19 DIAGNOSIS — E119 Type 2 diabetes mellitus without complications: Secondary | ICD-10-CM

## 2023-02-19 DIAGNOSIS — I110 Hypertensive heart disease with heart failure: Secondary | ICD-10-CM

## 2023-02-19 DIAGNOSIS — J189 Pneumonia, unspecified organism: Secondary | ICD-10-CM

## 2023-02-19 NOTE — Progress Notes (Signed)
HPI: Initial HH certification  Received home health orders orders from [facility]. Start of care 02/10/23.   Certification and orders from 02/10/23 through 04/10/23 are reviewed, signed and faxed back to home health company.  Need of intermittent skilled services at home: PT, OT  The home health care plan has been established by me and will be reviewed and updated as needed to maximize patient recovery.  I certify that all home health services have been and will be furnished to the patient while under my care.  Face-to-face encounter in which the need for home health services was established: 02/07/23 by discharge physician, seen by me on 02/17/23  Patient is receiving home health services for the following diagnoses: Problem List Items Addressed This Visit       Cardiovascular and Mediastinum   Diastolic heart failure   Hypertensive heart disease with chronic diastolic congestive heart failure     Respiratory   Cough variant asthma vs uacs    Severe persistent asthma   OSA (obstructive sleep apnea)     Endocrine   Type 2 diabetes mellitus without complication, without long-term current use of insulin     Other   Class 3 severe obesity due to excess calories with serious comorbidity and body mass index (BMI) of 45.0 to 49.9 in adult   Other sequelae following nontraumatic subarachnoid hemorrhage - Primary   Pneumonia, organism unspecified([phone])   Dyslipidemia     [name], MD

## 2023-02-26 NOTE — Progress Notes (Unsigned)
Cardiology Office Note:    Date:  02/27/2023   ID:  Jessica Snow, DOB [birth year], MRN [id number]  PCP:  Dorothyann Peng, MD   [facility] Providers Cardiologist:  Meriam Sprague, MD Sleep Medicine:  Armanda Magic, MD  { Referring MD: Dorothyann Peng, MD   History of Present Illness:    Jessica Snow is a 57 y.o. female with a hx of  chronic diastolic heart failure, hypertension, obesity, dilated ascending thoracic aorta, syncope, OSA who was previously followed by Dr. Delton See who presents to clinic for follow-up.  Previous CTA 2019 with coronary calcium score of 0, mild AI, ascending aortic dilation 39mm by CTA 2020.   She was seen in the ED 07/22/21 after syncopal episode in church. Troponin went from 12 to 18 but was presumed due to hypertension or CHF. She was provided dose of Lasix.   Seen in clinic 07/31/21 where she was doing well. Had some atypical chest pain. No further syncope. Cardiac monitor with NSR with rare PVCs, 1 episode of NSVT 7 beats.   Was last seen in clinic on 07/2022 where she was doing well from a CV standpoint. We increased her amlodipine for better BP control.  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 She was managed in Neuro ICU and underwent coiling. Course complicated by pneumonia but fortunately she recovered and was discharged home.  Today, the patient states she is slowly recovering from her [facility]. Has made a remarkable neurologic recovery. Continues to have intermittent HA but these are improving. Has been participation in PT and follows in the stroke center. Otherwise, she is doing well from a CV perspective. BP has been in 130s. No chest pain, LE edema, orthopnea, PND or orthopnea.      Past Medical History:  Diagnosis Date   Ascending aorta dilatation (HCC)    Asthma    Chronic diastolic CHF (congestive heart failure) (HCC)    Chronic rhinitis    Cough     Dilation of pulmonary artery (HCC)    Edema, lower extremity    GERD (gastroesophageal reflux disease)    HTN (hypertension)    Mild aortic insufficiency    Morbid obesity (HCC)    Murmur    Pneumonia    Pre-diabetes     Past Surgical History:  Procedure Laterality Date   ANKLE RECONSTRUCTION     TONSILLECTOMY     TOTAL VAGINAL HYSTERECTOMY  2007    Current Medications: Current Meds  Medication Sig   albuterol (VENTOLIN HFA) 108 (90 Base) MCG/ACT inhaler INHALE 2 PUFFS BY MOUTH EVERY 4 TO 6 HOURS AS NEEDED   amLODipine (NORVASC) 10 MG tablet Take 1 tablet (10 mg total) by mouth daily.   azelastine (OPTIVAR) 0.05 % ophthalmic solution Place 1 drop into both eyes 2 (two) times daily as needed (itchy/watery eyes).   Azelastine-Fluticasone 137-50 MCG/ACT SUSP PLACE 1 SPRAY INTO THE NOSE 2 (TWO) TIMES DAILY.   Budeson-Glycopyrrol-Formoterol (BREZTRI AEROSPHERE) 160-9-4.8 MCG/ACT AERO Inhale 2 puffs into the lungs in the morning and at bedtime.   Cholecalciferol 50 MCG (2000 UT) TABS Take by mouth.   diazepam (VALIUM) 5 MG tablet Take by mouth.   docusate sodium (COLACE) 100 MG capsule Take 100 mg by mouth 2 (two) times daily.   EPINEPHrine 0.3 mg/0.3 mL IJ SOAJ injection SMARTSIG:0.3 Milligram(s) IM Once PRN   ergocalciferol (VITAMIN D2) 1.25 MG (50000 UT) capsule Take  by mouth.   famotidine (PEPCID) 20 MG tablet TAKE 1 TABLET BY MOUTH EVERYDAY AT BEDTIME   gabapentin (NEURONTIN) 100 MG capsule Take by mouth.   levocetirizine (XYZAL) 5 MG tablet TAKE 1 TABLET BY MOUTH EVERY DAY IN THE EVENING   lidocaine (LIDODERM) 5 % Place onto the skin.   melatonin 1 MG TABS tablet Take by mouth.   montelukast (SINGULAIR) 10 MG tablet TAKE 1 TABLET BY MOUTH EVERYDAY AT BEDTIME   niMODipine (NIMOTOP) 30 MG capsule Take by mouth.   nystatin (MYCOSTATIN/NYSTOP) powder APPLY 1 APPLICATION TOPICALLY TWICE A DAY AS NEEDED   nystatin cream (MYCOSTATIN) Apply 1 Application topically 2 (two) times daily.    olmesartan-hydrochlorothiazide (BENICAR HCT) 20-12.5 MG tablet Olmesartan-Hydrochlorothiazide   Olopatadine HCl 0.2 % SOLN Apply 1 drop to eye daily.   pantoprazole (PROTONIX) 40 MG tablet Take 1 tablet by mouth daily   rosuvastatin (CRESTOR) 20 MG tablet Take by mouth.   Semaglutide, 1 MG/DOSE, (OZEMPIC, 1 MG/DOSE,) 4 MG/3ML SOPN INJECT 1MG  INTO THE SKIN ONCE A WEEK   Sennosides 17.2 MG TABS Take by mouth.   spironolactone (ALDACTONE) 50 MG tablet TAKE 1 TABLET BY MOUTH EVERY DAY   Current Facility-Administered Medications for the 02/27/23 encounter (Office Visit) with Meriam Sprague, MD  Medication   Benralizumab SOSY 30 mg     Allergies:   Honey bee treatment [bee venom] and Wheat   Social History   Socioeconomic History   Marital status: Single    Spouse name: Not on file   Number of children: Not on file   Years of education: Not on file   Highest education level: Not on file  Occupational History   Occupation: Youth worker for [employer]    Employer: [employer]  Tobacco Use   Smoking status: Former    Packs/day: 0.25    Years: 10.00    Additional pack years: 0.00    Total pack years: 2.50    Types: Cigarettes    Quit date: 01/26/2018    Years since quitting: 5.0   Smokeless tobacco: Former  Building services engineer Use: Never used  Substance and Sexual Activity   Alcohol use: No   Drug use: No   Sexual activity: Never  Other Topics Concern   Not on file  Social History Narrative   Lives with two children.     Social Determinants of Health   Financial Resource Strain: Not on file  Food Insecurity: Not on file  Transportation Needs: Not on file  Physical Activity: Not on file  Stress: Not on file  Social Connections: Not on file     Family History: The patient's family history includes Anemia in her father; Diabetes in her mother; Heart disease in her mother; Hypertension in her father and mother; Obesity in her mother; Sleep apnea in her mother. There is no  history of Breast cancer.  ROS:   Please see the history of present illness.    Review of Systems  Constitutional:  Negative for chills and fever.  HENT:  Negative for hearing loss and nosebleeds.   Eyes:  Negative for blurred vision and pain.  Respiratory:  Negative for sputum production.   Cardiovascular:  Negative for chest pain, palpitations, orthopnea, claudication, leg swelling and PND.  Gastrointestinal:  Negative for nausea and vomiting.  Genitourinary:  Negative for hematuria.  Musculoskeletal:  Positive for myalgias. Negative for falls.  Neurological:  Positive for weakness and headaches.     EKGs/Labs/Other Studies  Reviewed:    The following studies were reviewed today: TTE 01-26-23: Left Ventricle  Left ventricle size is normal. Wall thickness is normal. EF: 55-60%. Wall motion is normal. Doppler parameters are indeterminate for diastolic function.   Right Ventricle  Right ventricle size is normal. Systolic function is normal.   Left Atrium  Left atrium size is normal. Injection of agitated saline documents no interatrial shunt.   Right Atrium  Right atrium size is normal.   IVC/SVC  The inferior vena cava demonstrates a diameter of >2.1 cm and collapses >50%; therefore, the right atrial pressure is estimated at 8 mmHg.   Mitral Valve  The leaflets exhibit normal excursion. There is trace regurgitation.   Tricuspid Valve  The leaflets exhibit normal excursion. There is mild regurgitation. The right ventricular systolic pressure is normal (<36 mmHg).   Aortic Valve  The leaflets exhibit normal excursion. There is no regurgitation or stenosis.   Pulmonic Valve  The pulmonic valve was not well visualized. No regurgitation present on the pulmonic valve.   Ascending Aorta  The ascending aorta is mildly dilated.   Pericardium  There is no pericardial effusion.   TTE 08/21/21: IMPRESSIONS    1. Left ventricular ejection fraction, by estimation, is 65 to  70%. The  left ventricle has normal function. The left ventricle has no regional  wall motion abnormalities. Left ventricular diastolic parameters were  normal.   2. Right ventricular systolic function is normal. The right ventricular  size is normal. There is normal pulmonary artery systolic pressure.   3. The mitral valve is normal in structure. No evidence of mitral valve  regurgitation.   4. The aortic valve is normal in structure. Aortic valve regurgitation is  not visualized. No aortic stenosis is present.   Comparison(s): 11/03/20 EF 55-60%. Ascending aorta 40mm.   Conclusion(s)/Recommendation(s): Normal study.   Cardiac Monitor 07/2021: Predominant rhythm showed normal sinus rhythm with average heart rate 76 bpm. The heart rate ranged from 50 to 125 bpm. Rare PVCs with PVC load less than 1% PACs and nonsustained atrial tachycardia with longest run lasting 7 beats at 171 bpm     Patch Wear Time:  9 days and 20 hours (2022-10-04T10:08:01-399 to 2022-10-14T06:14:35-0400)   Patient had a min HR of 50 bpm, max HR of 171 bpm, and avg HR of 76 bpm. Predominant underlying rhythm was Sinus Rhythm. 2 Supraventricular Tachycardia runs occurred, the run with the fastest interval lasting 7 beats with a max rate of 171 bpm (avg 151  bpm); the run with the fastest interval was also the longest. Isolated SVEs were rare (<1.0%), SVE Couplets were rare (<1.0%), and SVE Triplets were rare (<1.0%). Isolated VEs were rare (<1.0%), and no VE Couplets or VE Triplets were present  MRI 08/10/21: FINDINGS: 1. Normal left ventricular size, with LVEDD 55 mm, and LVEDVi 77 mL/m2.   Normal left ventricular thickness, with intraventricular septal thickness of 12 mm, posterior wall thickness of 6 mm, and myocardial mass index of 51 g/m2.   Normal left ventricular systolic function (LVEF =56%). There are no regional wall motion abnormalities.   Left ventricular parametric mapping notable for normal ECV  and T2 signal.   There is no late gadolinium enhancement in the left ventricular myocardium.   2. Normal right ventricular size with RVEDVI 70 mL/m2.   Normal right ventricular thickness.   Normal right ventricular systolic function (RVEF = 56%). There are no regional wall motion abnormalities or aneurysms.   3.  Normal left and right atrial size.   4. Aortic MRA: The great vessels arose normally from the arch.   There was no evidence of coarctation.   The following measurements were obtained:   Sinus of Valsalva: 26 mm   Sinotubular Junction: 25 mm   Ascending Aorta: 39 mm; this is at the upper limits of normal for age and body surface area.   Aortic Arch: 25 mm   Descending Thoracic Aorta: 25 mm   Moderate to severe dilation of the main pulmonary artery 35 mm with trunk to aorta ratio of 0.9. This can be associated with the presence of pulmonary hypertension; clinical correlation advised.   5.  No significant valvular abnormalities.   Tri-leaflet aortic valve.   No significant different in LV and RV stroke volume.   6. Normal pericardium. No pericardial effusion. Prominent pericardial fat anterior the right ventricle   7. Grossly, no extracardiac findings. Recommended dedicated study if concerned for non-cardiac pathology.   IMPRESSION: Ascending Aorta is at the upper limit of normal.   Moderate to severe dilation of the main pulmonary artery 35 mm with trunk to aorta ratio of 0.9. This can be associated with the presence of pulmonary hypertension.  EKG:  No new ECG today  Recent Labs: 02/17/2023: ALT 25; BUN 6; Creatinine, Ser 0.65; Hemoglobin 12.1; Platelets 348; Potassium 4.1; Sodium 143   Recent Lipid Panel    Component Value Date/Time   CHOL 196 02/17/2023 1638   TRIG 160 (H) 02/17/2023 1638   HDL 55 02/17/2023 1638   CHOLHDL 3.6 02/17/2023 1638   LDLCALC 113 (H) 02/17/2023 1638     Risk Assessment/Calculations:           Physical  Exam:    VS:  BP (!) 138/90   Pulse 89   Ht 5\' 3"  (1.6 m)   Wt 262 lb 3.2 oz (118.9 kg)   LMP 12/19/2005   SpO2 98%   BMI 46.45 kg/m     Wt Readings from Last 3 Encounters:  02/27/23 262 lb 3.2 oz (118.9 kg)  02/17/23 262 lb 12.8 oz (119.2 kg)  01/15/23 273 lb 1.6 oz (123.9 kg)     GEN:  Comfortable, NAD HEENT: Normal NECK: No JVD; No carotid bruits CARDIAC: RRR, no murmurs, rubs, gallops RESPIRATORY:  CTAB, no wheezes ABDOMEN: Soft, non-tender, non-distended MUSCULOSKELETAL:  No edema, warm SKIN: Warm and dry NEUROLOGIC:  Alert and oriented x 3 PSYCHIATRIC:  Normal affect   ASSESSMENT:    1. SAH (subarachnoid hemorrhage) (HCC)   2. Ascending aorta dilatation (HCC)   3. Chronic diastolic CHF (congestive heart failure) (HCC)   4. Syncope and collapse   5. Morbid obesity due to excess calories (HCC)   6. Essential hypertension   7. OSA on CPAP     PLAN:    In order of problems listed above:  #Recent SAH: #R ACA Aneurysm: -Occurred in 12/2022 after episode of coughing -Underwent successful coiling and has made a Neurologic recovery -Follow-up with Neuro as scheduled  #Chronic Diastolic HF: Doing well and euvolemic. -Continue spironolactone 50mg  daily -Not on lasix and euvolemic -BP control as below  #HTN: Running mainly in 130s. Management per Neuro due to recent [facility]. -Continue spironolactone 50mg  daily -Continue amlodipine 10mg  daily  #OSA Her CPAP mask is broken but she is working on getting a replacement. Discussed it is important to resume it.  -Follow-up with Armanda Magic as scheduled  #Syncope: No recurrence. Had episode of syncope at church  in the setting of not eating breakfast of not eating. Echo 11/03/20 normal LVEF, no significant valvular abnormalities, mildly dilated ascending aorta 40mm. Cardiac monitor with rare ectopy with nonsustained atach episode lasting 7 beats.  -Continue to monitor -Maintain good hydration  #Thoracic Aortic  Aneurysm Measures 40mm on MRI 07/2022. Will repeat imaging in 12/2022 given recent TTE -Continue serial monitoring with TTE  #Morbid Obesity: BMI 46. Continues lifestyle modifications as able. On Ozempic.   Exercise recommendations: Goal of exercising for at least 30 minutes a day, at least 5 times per week.  Please exercise to a moderate exertion.  This means that while exercising it is difficult to speak in full sentences, however you are not so short of breath that you feel you must stop, and not so comfortable that you can carry on a full conversation.  Exertion level should be approximately a 5/10, if 10 is the most exertion you can perform.  Diet recommendations: Recommend a heart healthy diet such as the Mediterranean diet.  This diet consists of plant based foods, healthy fats, lean meats, olive oil.  It suggests limiting the intake of simple carbohydrates such as white breads, pastries, and pastas.  It also limits the amount of red meat, wine, and dairy products such as cheese that one should consume on a daily basis.       Follow-up:  6 months.  Medication Adjustments/Labs and Tests Ordered: Current medicines are reviewed at length with the patient today.  Concerns regarding medicines are outlined above.   No orders of the defined types were placed in this encounter.  No orders of the defined types were placed in this encounter.  Patient Instructions  Medication Instructions:   Your physician recommends that you continue on your current medications as directed. Please refer to the Current Medication list given to you today.  *If you need a refill on your cardiac medications before your next appointment, please call your pharmacy*    Follow-Up:  1.)  PLEASE CANCEL PATIENTS ECHO ON 08/19/23 FOR SHE JUST HAD THIS DONE IN THE HOSPITAL PER DR. Shari Prows   2.)  At [facility], you and your health needs are our priority.  As part of our continuing mission to provide  you with exceptional heart care, we have created designated Provider Care Teams.  These Care Teams include your primary Cardiologist (physician) and Advanced Practice Providers (APPs -  Physician Assistants and Nurse Practitioners) who all work together to provide you with the care you need, when you need it.  We recommend signing up for the patient portal called "MyChart".  Sign up information is provided on this After Visit Summary.  MyChart is used to connect with patients for Virtual Visits (Telemedicine).  Patients are able to view lab/test results, encounter notes, upcoming appointments, etc.  Non-urgent messages can be sent to your provider as well.   To learn more about what you can do with MyChart, go to [email or web address].    Your next appointment:   6 month(s)  Provider:   Meriam Sprague, MD        Signed, Meriam Sprague, MD  02/27/2023 11:28 AM     [facility]

## 2023-02-27 ENCOUNTER — Encounter: Payer: Self-pay | Admitting: Cardiology

## 2023-02-27 ENCOUNTER — Ambulatory Visit: Payer: BC Managed Care – PPO | Attending: Nurse Practitioner | Admitting: Cardiology

## 2023-02-27 VITALS — BP 138/90 | HR 89 | Ht 63.0 in | Wt 262.2 lb

## 2023-02-27 DIAGNOSIS — I609 Nontraumatic subarachnoid hemorrhage, unspecified: Secondary | ICD-10-CM

## 2023-02-27 DIAGNOSIS — I77810 Thoracic aortic ectasia: Secondary | ICD-10-CM | POA: Diagnosis not present

## 2023-02-27 DIAGNOSIS — R55 Syncope and collapse: Secondary | ICD-10-CM | POA: Diagnosis not present

## 2023-02-27 DIAGNOSIS — I5032 Chronic diastolic (congestive) heart failure: Secondary | ICD-10-CM

## 2023-02-27 DIAGNOSIS — I10 Essential (primary) hypertension: Secondary | ICD-10-CM

## 2023-02-27 DIAGNOSIS — G4733 Obstructive sleep apnea (adult) (pediatric): Secondary | ICD-10-CM

## 2023-02-27 NOTE — Patient Instructions (Addendum)
Medication Instructions:   Your physician recommends that you continue on your current medications as directed. Please refer to the Current Medication list given to you today.  *If you need a refill on your cardiac medications before your next appointment, please call your pharmacy*    Follow-Up:  1.)  PLEASE CANCEL PATIENTS ECHO ON 08/19/23 FOR SHE JUST HAD THIS DONE IN THE HOSPITAL PER DR. Shari Prows   2.)  At [facility], you and your health needs are our priority.  As part of our continuing mission to provide you with exceptional heart care, we have created designated Provider Care Teams.  These Care Teams include your primary Cardiologist (physician) and Advanced Practice Providers (APPs -  Physician Assistants and Nurse Practitioners) who all work together to provide you with the care you need, when you need it.  We recommend signing up for the patient portal called "MyChart".  Sign up information is provided on this After Visit Summary.  MyChart is used to connect with patients for Virtual Visits (Telemedicine).  Patients are able to view lab/test results, encounter notes, upcoming appointments, etc.  Non-urgent messages can be sent to your provider as well.   To learn more about what you can do with MyChart, go to [email or web address].    Your next appointment:   6 month(s)  Provider:   Meriam Sprague, MD

## 2023-03-03 ENCOUNTER — Encounter: Payer: Self-pay | Admitting: Internal Medicine

## 2023-03-07 ENCOUNTER — Other Ambulatory Visit: Payer: Self-pay | Admitting: Allergy

## 2023-03-10 ENCOUNTER — Encounter: Payer: Self-pay | Admitting: Internal Medicine

## 2023-03-12 ENCOUNTER — Ambulatory Visit: Payer: BC Managed Care – PPO

## 2023-03-19 ENCOUNTER — Ambulatory Visit: Payer: BC Managed Care – PPO | Admitting: Nurse Practitioner

## 2023-05-29 ENCOUNTER — Encounter: Payer: Self-pay | Admitting: Internal Medicine

## 2023-05-29 ENCOUNTER — Ambulatory Visit: Payer: BC Managed Care – PPO | Admitting: Internal Medicine

## 2023-05-29 VITALS — BP 130/80 | HR 74 | Temp 98.3°F | Ht 63.0 in | Wt 271.2 lb

## 2023-05-29 DIAGNOSIS — G44229 Chronic tension-type headache, not intractable: Secondary | ICD-10-CM

## 2023-05-29 DIAGNOSIS — Z6841 Body Mass Index (BMI) 40.0 and over, adult: Secondary | ICD-10-CM

## 2023-05-29 DIAGNOSIS — G44209 Tension-type headache, unspecified, not intractable: Secondary | ICD-10-CM

## 2023-05-29 DIAGNOSIS — E7800 Pure hypercholesterolemia, unspecified: Secondary | ICD-10-CM

## 2023-05-29 DIAGNOSIS — I5032 Chronic diastolic (congestive) heart failure: Secondary | ICD-10-CM

## 2023-05-29 DIAGNOSIS — E1169 Type 2 diabetes mellitus with other specified complication: Secondary | ICD-10-CM

## 2023-05-29 DIAGNOSIS — Z8679 Personal history of other diseases of the circulatory system: Secondary | ICD-10-CM

## 2023-05-29 DIAGNOSIS — I110 Hypertensive heart disease with heart failure: Secondary | ICD-10-CM | POA: Diagnosis not present

## 2023-05-29 DIAGNOSIS — E119 Type 2 diabetes mellitus without complications: Secondary | ICD-10-CM

## 2023-05-29 DIAGNOSIS — I609 Nontraumatic subarachnoid hemorrhage, unspecified: Secondary | ICD-10-CM

## 2023-05-29 MED ORDER — TIZANIDINE HCL 4 MG PO TABS: 4.0000 mg | ORAL_TABLET | Freq: Every day | ORAL | 1 refills | Status: DC

## 2023-05-29 MED ORDER — ATORVASTATIN CALCIUM 20 MG PO TABS: ORAL_TABLET | ORAL | 11 refills | Status: DC

## 2023-05-29 MED ORDER — OZEMPIC (1 MG/DOSE) 4 MG/3ML ~~LOC~~ SOPN
PEN_INJECTOR | SUBCUTANEOUS | 3 refills | Status: DC
Start: 2023-05-29 — End: 2023-09-02

## 2023-05-29 NOTE — Patient Instructions (Signed)

## 2023-05-29 NOTE — Progress Notes (Signed)
I,[name], CMA,acting as a Neurosurgeon for Jessica Aliment, MD.,have documented all relevant documentation on the behalf of Jessica Aliment, MD,as directed by  Jessica Aliment, MD while in the presence of Jessica Aliment, MD.  Subjective:  Patient ID: Jessica Snow , female    DOB: Jan 29, 1966 , 57 y.o.   MRN: [id number]  Chief Complaint  Patient presents with   Diabetes   Hypertension    HPI  The patient is here today for a follow-up on diabetes and HTN. She reports compliance with meds. She denies worsening sob & chest pain. She does get SOB when exercising, sx resolve when she stops. She admits she is not exercising on a regular basis.   She admits having occasional headaches, with nausea.  Headaches usually occur at the base of her neck; however, she also has generalized headaches on occasion.  She has recent diagnosis of subarachnoid hemorrhage, now followed by Neurology. She was advised she may experience these symptoms for some time. She is unable to determine what triggers her sx.       Diabetes She presents for her follow-up diabetic visit. She has type 2 diabetes mellitus. Hypoglycemia symptoms include headaches. There are no diabetic associated symptoms. Pertinent negatives for diabetes include no blurred vision, no chest pain, no polydipsia, no polyphagia, no polyuria and no weakness. There are no hypoglycemic complications. Risk factors for coronary artery disease include diabetes mellitus, obesity, hypertension and sedentary lifestyle.  Hypertension This is a chronic problem. The current episode started more than 1 year ago. The problem has been gradually improving since onset. The problem is uncontrolled. Associated symptoms include headaches. Pertinent negatives include no blurred vision, chest pain, palpitations or shortness of breath. Past treatments include angiotensin blockers and diuretics. The current treatment provides mild improvement. Compliance problems include  exercise.      Past Medical History:  Diagnosis Date   Ascending aorta dilatation (HCC)    Asthma    Chronic diastolic CHF (congestive heart failure) (HCC)    Chronic rhinitis    Cough    Dilation of pulmonary artery (HCC)    Edema, lower extremity    GERD (gastroesophageal reflux disease)    HTN (hypertension)    Mild aortic insufficiency    Morbid obesity (HCC)    Murmur    Pneumonia    Pre-diabetes      Family History  Problem Relation Age of Onset   Hypertension Mother    Diabetes Mother    Heart disease Mother    Sleep apnea Mother    Obesity Mother    Hypertension Father    Anemia Father    Breast cancer Neg Hx      Current Outpatient Medications:    albuterol (VENTOLIN HFA) 108 (90 Base) MCG/ACT inhaler, TAKE 2 PUFFS BY MOUTH EVERY 4 TO 6 HOURS AS NEEDED, Disp: 18 each, Rfl: 1   amLODipine (NORVASC) 10 MG tablet, Take 1 tablet (10 mg total) by mouth daily., Disp: 90 tablet, Rfl: 3   atorvastatin (LIPITOR) 20 MG tablet, One tab po every day, except Sundays, Disp: 30 tablet, Rfl: 11   azelastine (OPTIVAR) 0.05 % ophthalmic solution, Place 1 drop into both eyes 2 (two) times daily as needed (itchy/watery eyes)., Disp: 6 mL, Rfl: 5   Azelastine-Fluticasone 137-50 MCG/ACT SUSP, PLACE 1 SPRAY INTO THE NOSE 2 (TWO) TIMES DAILY., Disp: 23 g, Rfl: 5   Budeson-Glycopyrrol-Formoterol (BREZTRI AEROSPHERE) 160-9-4.8 MCG/ACT AERO, Inhale 2 puffs into the lungs  in the morning and at bedtime., Disp: 10.7 g, Rfl: 5   Cholecalciferol 50 MCG (2000 UT) TABS, Take by mouth., Disp: , Rfl:    docusate sodium (COLACE) 100 MG capsule, Take 100 mg by mouth 2 (two) times daily., Disp: , Rfl:    EPINEPHrine 0.3 mg/0.3 mL IJ SOAJ injection, SMARTSIG:0.3 Milligram(s) IM Once PRN, Disp: 1 each, Rfl: 2   ergocalciferol (VITAMIN D2) 1.25 MG (50000 UT) capsule, Take by mouth., Disp: , Rfl:    famotidine (PEPCID) 20 MG tablet, TAKE 1 TABLET BY MOUTH EVERYDAY AT BEDTIME, Disp: 90 tablet, Rfl: 0    gabapentin (NEURONTIN) 100 MG capsule, Take by mouth., Disp: , Rfl:    levocetirizine (XYZAL) 5 MG tablet, TAKE 1 TABLET BY MOUTH EVERY DAY IN THE EVENING, Disp: 90 tablet, Rfl: 1   melatonin 1 MG TABS tablet, Take by mouth., Disp: , Rfl:    montelukast (SINGULAIR) 10 MG tablet, TAKE 1 TABLET BY MOUTH EVERYDAY AT BEDTIME, Disp: 90 tablet, Rfl: 1   nystatin (MYCOSTATIN/NYSTOP) powder, APPLY 1 APPLICATION TOPICALLY TWICE A DAY AS NEEDED, Disp: 30 g, Rfl: 1   nystatin cream (MYCOSTATIN), Apply 1 Application topically 2 (two) times daily., Disp: 30 g, Rfl: 2   olmesartan-hydrochlorothiazide (BENICAR HCT) 20-12.5 MG tablet, Olmesartan-Hydrochlorothiazide, Disp: , Rfl:    Olopatadine HCl 0.2 % SOLN, Apply 1 drop to eye daily., Disp: 2.5 mL, Rfl: 5   pantoprazole (PROTONIX) 40 MG tablet, Take 1 tablet by mouth daily, Disp: 180 tablet, Rfl: 0   Sennosides 17.2 MG TABS, Take by mouth., Disp: , Rfl:    spironolactone (ALDACTONE) 50 MG tablet, TAKE 1 TABLET BY MOUTH EVERY DAY, Disp: 90 tablet, Rfl: 3   tiZANidine (ZANAFLEX) 4 MG tablet, Take 1 tablet (4 mg total) by mouth daily., Disp: 30 tablet, Rfl: 1   Semaglutide, 1 MG/DOSE, (OZEMPIC, 1 MG/DOSE,) 4 MG/3ML SOPN, INJECT 1MG  INTO THE SKIN ONCE A WEEK, Disp: 3 mL, Rfl: 3  Current Facility-Administered Medications:    Benralizumab SOSY 30 mg, 30 mg, Subcutaneous, Q8 weeks, Padgett, Pilar Grammes, MD, 30 mg at 01/15/23 8295   Allergies  Allergen Reactions   Honey Bee Treatment [Bee Venom]    Wheat      Review of Systems  Constitutional: Negative.   HENT: Negative.    Eyes:  Negative for blurred vision.  Respiratory: Negative.  Negative for shortness of breath.   Cardiovascular: Negative.  Negative for chest pain and palpitations.  Gastrointestinal: Negative.   Endocrine: Negative for polydipsia, polyphagia and polyuria.  Musculoskeletal: Negative.   Neurological:  Positive for headaches. Negative for weakness.  Psychiatric/Behavioral:  Negative.       Today's Vitals   05/29/23 1138  BP: 130/80  Pulse: 74  Temp: 98.3 F (36.8 C)  SpO2: 98%  Weight: 271 lb 3.2 oz (123 kg)  Height: 5\' 3"  (1.6 m)   Body mass index is 48.04 kg/m.  Wt Readings from Last 3 Encounters:  05/29/23 271 lb 3.2 oz (123 kg)  02/27/23 262 lb 3.2 oz (118.9 kg)  02/17/23 262 lb 12.8 oz (119.2 kg)     Objective:  Physical Exam Vitals and nursing note reviewed.  Constitutional:      Appearance: Normal appearance. She is obese.  HENT:     Head: Normocephalic and atraumatic.  Eyes:     Extraocular Movements: Extraocular movements intact.  Cardiovascular:     Rate and Rhythm: Normal rate and regular rhythm.     Heart sounds: Normal  heart sounds.  Pulmonary:     Effort: Pulmonary effort is normal.     Breath sounds: Normal breath sounds.  Musculoskeletal:     Cervical back: Normal range of motion.  Skin:    General: Skin is warm.  Neurological:     General: No focal deficit present.     Mental Status: She is alert and oriented to person, place, and time.  Psychiatric:        Mood and Affect: Mood normal.        Behavior: Behavior normal.         Assessment And Plan:  Type 2 diabetes mellitus with other specified complication, without long-term current use of insulin (HCC) Assessment & Plan: Chronic, currently on Ozempic 1mg  weekly. She is encouraged to optimize her protein intake to decrease risk of muscle wasting. She will rto in 3-4 months for re-evaluation.   Orders: -     CMP14+EGFR -     Hemoglobin A1c -     Ozempic (1 MG/DOSE); INJECT 1MG  INTO THE SKIN ONCE A WEEK  Dispense: 3 mL; Refill: 3  Hypertensive heart disease with chronic diastolic congestive heart failure (HCC) Assessment & Plan: Chronic, importance of dietary/medication compliance was discussed with the patient. She will continue with amlodipine 10mg , spironolactone 50mg , and olmesartan/hct 20/12.5mg  daily.    Tension headache Assessment & Plan: She was  advised that the headaches at the base of her neck are likely tension headaches. Encouraged to perform stretching exercises daily. I will send rx tizanidine to use nightly prn.   Pure hypercholesterolemia Assessment & Plan: Chronic, LDL goal < 70.  She will continue with atorvastatin 20mg  daily. She is encouraged to follow a heart healthy diet.   Orders: -     Atorvastatin Calcium; One tab po every day, except Sundays  Dispense: 30 tablet; Refill: 11  Class 3 severe obesity due to excess calories with serious comorbidity and body mass index (BMI) of 45.0 to 49.9 in adult [facility]) Assessment & Plan: She was  advised of 9lb weight gain since May 2024.  She is encouraged to gradually increase her daily activity, aiming for at least 150 minutes per week. Advised to start with 10 minute increments and gradually add time.    History of subarachnoid hemorrhage Assessment & Plan: Unfortunately, she has been having chronic headaches. Most recent Neuro notes were reviewed in Care Everywhere.    Other orders -     tiZANidine HCl; Take 1 tablet (4 mg total) by mouth daily.  Dispense: 30 tablet; Refill: 1  She is encouraged to strive for BMI less than 30 to decrease cardiac risk. Advised to aim for at least 150 minutes of exercise per week.    Return in about 3 months (around 08/29/2023), or dm check.  Patient was given opportunity to ask questions. Patient verbalized understanding of the plan and was able to repeat key elements of the plan. All questions were answered to their satisfaction.    I, Jessica Aliment, MD, have reviewed all documentation for this visit. The documentation on 05/29/23 for the exam, diagnosis, procedures, and orders are all accurate and complete.   IF YOU HAVE BEEN REFERRED TO A SPECIALIST, IT MAY TAKE 1-2 WEEKS TO SCHEDULE/PROCESS THE REFERRAL. IF YOU HAVE NOT HEARD FROM US/SPECIALIST IN TWO WEEKS, PLEASE GIVE [place] A CALL AT [phone].   THE PATIENT IS ENCOURAGED TO  PRACTICE SOCIAL DISTANCING DUE TO THE COVID-19 PANDEMIC.

## 2023-06-02 NOTE — Assessment & Plan Note (Addendum)
Unfortunately, she has been having chronic headaches. Most recent Neuro notes were reviewed in Care Everywhere.

## 2023-06-07 ENCOUNTER — Encounter: Payer: Self-pay | Admitting: Internal Medicine

## 2023-06-07 DIAGNOSIS — G44209 Tension-type headache, unspecified, not intractable: Secondary | ICD-10-CM | POA: Insufficient documentation

## 2023-06-07 NOTE — Assessment & Plan Note (Signed)
She was advised that the headaches at the base of her neck are likely tension headaches. Encouraged to perform stretching exercises daily. I will send rx tizanidine to use nightly prn.

## 2023-06-07 NOTE — Assessment & Plan Note (Signed)
Chronic, importance of dietary/medication compliance was discussed with the patient. She will continue with amlodipine 10mg , spironolactone 50mg , and olmesartan/hct 20/12.5mg  daily.

## 2023-06-07 NOTE — Assessment & Plan Note (Signed)
Chronic, currently on Ozempic 1mg  weekly. She is encouraged to optimize her protein intake to decrease risk of muscle wasting. She will rto in 3-4 months for re-evaluation.

## 2023-06-07 NOTE — Assessment & Plan Note (Signed)
Chronic, LDL goal < 70.  She will continue with atorvastatin 20mg  daily. She is encouraged to follow a heart healthy diet.

## 2023-06-07 NOTE — Assessment & Plan Note (Signed)
She was  advised of 9lb weight gain since May 2024.  She is encouraged to gradually increase her daily activity, aiming for at least 150 minutes per week. Advised to start with 10 minute increments and gradually add time.

## 2023-06-13 ENCOUNTER — Other Ambulatory Visit: Payer: Self-pay | Admitting: Internal Medicine

## 2023-06-13 DIAGNOSIS — Z1231 Encounter for screening mammogram for malignant neoplasm of breast: Secondary | ICD-10-CM

## 2023-06-20 ENCOUNTER — Other Ambulatory Visit: Payer: Self-pay | Admitting: Internal Medicine

## 2023-06-24 ENCOUNTER — Ambulatory Visit (INDEPENDENT_AMBULATORY_CARE_PROVIDER_SITE_OTHER): Payer: BC Managed Care – PPO

## 2023-06-24 ENCOUNTER — Ambulatory Visit
Admission: RE | Admit: 2023-06-24 | Discharge: 2023-06-24 | Disposition: A | Discharge status: Home / Self Care | Payer: BC Managed Care – PPO | Source: Ambulatory Visit | Attending: Internal Medicine | Admitting: Internal Medicine

## 2023-06-24 VITALS — BP 138/80 | HR 71

## 2023-06-24 DIAGNOSIS — Z23 Encounter for immunization: Secondary | ICD-10-CM

## 2023-06-24 DIAGNOSIS — Z1231 Encounter for screening mammogram for malignant neoplasm of breast: Secondary | ICD-10-CM

## 2023-06-24 LAB — AMB RESULTS CONSOLE CBG: Glucose: 111

## 2023-06-24 NOTE — Progress Notes (Signed)
Pt has PCP and no SDOH needs

## 2023-07-17 ENCOUNTER — Other Ambulatory Visit: Payer: Self-pay

## 2023-07-17 ENCOUNTER — Ambulatory Visit (INDEPENDENT_AMBULATORY_CARE_PROVIDER_SITE_OTHER): Payer: BC Managed Care – PPO | Admitting: Allergy

## 2023-07-17 ENCOUNTER — Encounter: Payer: Self-pay | Admitting: Allergy

## 2023-07-17 VITALS — BP 136/80 | HR 78 | Temp 98.3°F | Wt 274.4 lb

## 2023-07-17 DIAGNOSIS — J4550 Severe persistent asthma, uncomplicated: Secondary | ICD-10-CM

## 2023-07-17 DIAGNOSIS — H1013 Acute atopic conjunctivitis, bilateral: Secondary | ICD-10-CM

## 2023-07-17 DIAGNOSIS — J3089 Other allergic rhinitis: Secondary | ICD-10-CM | POA: Diagnosis not present

## 2023-07-17 DIAGNOSIS — T7800XA Anaphylactic reaction due to unspecified food, initial encounter: Secondary | ICD-10-CM | POA: Diagnosis not present

## 2023-07-17 MED ORDER — AZELASTINE HCL 0.05 % OP SOLN: 1.0000 [drp] | Freq: Two times a day (BID) | OPHTHALMIC | 5 refills | Status: DC | PRN

## 2023-07-17 MED ORDER — ALBUTEROL SULFATE HFA 108 (90 BASE) MCG/ACT IN AERS: INHALATION_SPRAY | RESPIRATORY_TRACT | 1 refills | Status: DC

## 2023-07-17 MED ORDER — AZELASTINE-FLUTICASONE 137-50 MCG/ACT NA SUSP: 1.0000 | Freq: Two times a day (BID) | NASAL | 5 refills | Status: DC

## 2023-07-17 MED ORDER — MONTELUKAST SODIUM 10 MG PO TABS: ORAL_TABLET | ORAL | 1 refills | Status: DC

## 2023-07-17 MED ORDER — LEVOCETIRIZINE DIHYDROCHLORIDE 5 MG PO TABS: ORAL_TABLET | ORAL | 1 refills | Status: DC

## 2023-07-17 MED ORDER — BREZTRI AEROSPHERE 160-9-4.8 MCG/ACT IN AERO: 2.0000 | INHALATION_SPRAY | Freq: Two times a day (BID) | RESPIRATORY_TRACT | 5 refills | Status: DC

## 2023-07-17 NOTE — Patient Instructions (Addendum)
Asthma -   - control is currently good off Fasenra right now.  Will see how you do back in the swing of work with your asthma control. If asthma is flaring then will resume Fasenra at that time  - continue Breztri 2 puffs twice a day and use with spacer device.    - have access to albuterol inhaler 2 puffs every 4-6 hours as needed for cough/wheeze/shortness of breath/chest tightness.  May use 15-20 minutes prior to activity.   Monitor frequency of use.    - continue Singulair 10mg  daily at bedtime.  Asthma control goals:  Full participation in all desired activities (may need albuterol before activity) Albuterol use two time or less a week on average (not counting use with activity) Cough interfering with sleep two time or less a month Oral steroids no more than once a year No hospitalizations  Allergies   - continue allergen avoidance measures for tree pollen and mold  - continue Xyzal 5mg  daily 1-2 tabs  - continue singulair as above  - continue nasal saline spray to help keep nose moisturized  - use 3 times a week (Mon, Wed, Fri) Flonase + Astelin (nasal antihistamine) 1 spray each nostril twice a day.  This helps with both nasal congestion and drainage.   - use Optivar or Bepreve 1 drop each eye twice a day as needed for itchy/watery eyes  - recommend after getting in from being outside to wash/wipe of face and eyes  Food allergy vs intolerance  - you have had serum IgE detectable levels to wheat and honey however they are low  - continue avoidance of honey and wheat in the diet  - have access to self-injectable epinephrine (Epipen or AuviQ) 0.3mg  at all times  - follow emergency action plan in case of allergic reaction  Follow-up in 6 months or sooner if needed

## 2023-07-17 NOTE — Progress Notes (Signed)
Follow-up Note  RE: Jessica Snow MRN: [id number] DOB: 1966-01-18 Date of Office Visit: 07/17/2023   History of present illness: Jessica Snow is a 57 y.o. female presenting today for follow-up of asthma, allergic rhinitis with conjunctivitis and food allergy/intolerance.  She was last seen in the office on 01/15/23 by myself.   After her last visit she was seen in the ED for headache symptoms and was found to have a subarachnoid hemorrhage.  She states she was eating toast and choked on the toast and then during that felt like there was a pop in her neck and then became hot and sweaty and she just did not feel right.  EMS was called as her mother was there and she was noted to have elevated blood pressure.  She was taken to the hospital where she was found to have a subarachnoid hemorrhage.  She was admitted from 01/23/2023 to 02/07/2023 where she was in the neuro ICU and she received treatment for this hemorrhage.  In May she did have an EEG to evaluate for seizures that was normal after she was noted to have periods of confusion. She is doing better from this.  She states she has essentially been out of work at home because of this.  She just recently went back to work about a week ago.  Because she was home and not out and not working she states she did not have as much stress and thus her asthma symptoms were doing well.  She states she has not needed to use her rescue inhaler since before her last visit.  She has not been receiving Fasenra injections because she was due for her dose around the time of the hospitalization.  Even though she has not had [place] since January she has not noted increase in asthma symptoms.  She does continue to take Singulair as well as Breztri 2 puffs twice a day.  She has not had any systemic steroids since her last visit for asthma symptoms. Also because she has not been out much her allergy symptoms have been pretty controlled.  Again she takes Singulair and does  use Xyzal.  She also has access to Flonase and Astelin sprays for congestion and drainage use.  However she has not had to use this much and she also has not needed to use her eyedrop much.  She does state though since she has been back at work she is noting more mucus at night. She continues to avoid wheat and honey in the diet.  She states there was an occasion where she felt like she was having some itchy throat and throat discomfort and when she looked at the packaging for the food believes it did contain either wheat or honey product.  She does have access to her epinephrine device.  Review of systems: 10pt ROS negative unless noted above in HPI  Past medical/social/surgical/family history have been reviewed and are unchanged unless specifically indicated below.  No changes  Medication List: Current Outpatient Medications  Medication Sig Dispense Refill   albuterol (VENTOLIN HFA) 108 (90 Base) MCG/ACT inhaler TAKE 2 PUFFS BY MOUTH EVERY 4 TO 6 HOURS AS NEEDED 18 each 1   amLODipine (NORVASC) 10 MG tablet Take 1 tablet (10 mg total) by mouth daily. 90 tablet 3   atorvastatin (LIPITOR) 20 MG tablet One tab po every day, except Sundays 30 tablet 11   azelastine (OPTIVAR) 0.05 % ophthalmic solution Place 1 drop into both eyes 2 (  two) times daily as needed (itchy/watery eyes). 6 mL 5   Azelastine-Fluticasone 137-50 MCG/ACT SUSP PLACE 1 SPRAY INTO THE NOSE 2 (TWO) TIMES DAILY. 23 g 5   Budeson-Glycopyrrol-Formoterol (BREZTRI AEROSPHERE) 160-9-4.8 MCG/ACT AERO Inhale 2 puffs into the lungs in the morning and at bedtime. 10.7 g 5   Cholecalciferol 50 MCG (2000 UT) TABS Take by mouth.     docusate sodium (COLACE) 100 MG capsule Take 100 mg by mouth 2 (two) times daily.     EPINEPHrine 0.3 mg/0.3 mL IJ SOAJ injection SMARTSIG:0.3 Milligram(s) IM Once PRN 1 each 2   ergocalciferol (VITAMIN D2) 1.25 MG (50000 UT) capsule Take by mouth.     famotidine (PEPCID) 20 MG tablet TAKE 1 TABLET BY MOUTH  EVERYDAY AT BEDTIME 90 tablet 0   gabapentin (NEURONTIN) 100 MG capsule Take by mouth.     levocetirizine (XYZAL) 5 MG tablet TAKE 1 TABLET BY MOUTH EVERY DAY IN THE EVENING 90 tablet 1   melatonin 1 MG TABS tablet Take by mouth.     montelukast (SINGULAIR) 10 MG tablet TAKE 1 TABLET BY MOUTH EVERYDAY AT BEDTIME 90 tablet 1   NURTEC 75 MG TBDP Take by mouth.     nystatin (MYCOSTATIN/NYSTOP) powder APPLY 1 APPLICATION TOPICALLY TWICE A DAY AS NEEDED 30 g 1   nystatin cream (MYCOSTATIN) Apply 1 Application topically 2 (two) times daily. 30 g 2   olmesartan-hydrochlorothiazide (BENICAR HCT) 20-12.5 MG tablet Olmesartan-Hydrochlorothiazide     Olopatadine HCl 0.2 % SOLN Apply 1 drop to eye daily. 2.5 mL 5   pantoprazole (PROTONIX) 40 MG tablet Take 1 tablet by mouth daily 180 tablet 0   Semaglutide, 1 MG/DOSE, (OZEMPIC, 1 MG/DOSE,) 4 MG/3ML SOPN INJECT 1MG  INTO THE SKIN ONCE A WEEK 3 mL 3   Sennosides 17.2 MG TABS Take by mouth.     spironolactone (ALDACTONE) 50 MG tablet TAKE 1 TABLET BY MOUTH EVERY DAY 90 tablet 3   tiZANidine (ZANAFLEX) 4 MG tablet One tab po every day prn 30 tablet 1   Current Facility-Administered Medications  Medication Dose Route Frequency Provider Last Rate Last Admin   Benralizumab SOSY 30 mg  30 mg Subcutaneous Q8 weeks Marcelyn Bruins, MD   30 mg at 01/15/23 9563     Known medication allergies: Allergies  Allergen Reactions   Honey Bee Treatment [Bee Venom]    Wheat      Physical examination: Blood pressure 136/80, pulse 78, temperature 98.3 F (36.8 C), weight 274 lb 6.4 oz (124.5 kg), last menstrual period 12/19/2005, SpO2 96%.  General: Alert, interactive, in no acute distress. HEENT: PERRLA, TMs pearly gray, turbinates non-edematous without discharge, post-pharynx non erythematous. Neck: Supple without lymphadenopathy. Lungs: Clear to auscultation without wheezing, rhonchi or rales. {no increased work of breathing. CV: Normal S1, S2 without  murmurs. Abdomen: Nondistended, nontender. Skin: Warm and dry, without lesions or rashes. Extremities:  No clubbing, cyanosis or edema. Neuro:   Grossly intact.  Diagnositics/Labs:  Spirometry: FEV1: 1.13 L or 52%, FVC: 1.54 L 56% predicted.  This is quite stable for her  Assessment and plan:   Asthma   - control is currently good off Fasenra right now.  Will see how you do back in the swing of work with your asthma control. If asthma is flaring then will resume Fasenra at that time  - continue Breztri 2 puffs twice a day and use with spacer device.    - have access to albuterol inhaler 2 puffs every  4-6 hours as needed for cough/wheeze/shortness of breath/chest tightness.  May use 15-20 minutes prior to activity.   Monitor frequency of use.    - continue Singulair 10mg  daily at bedtime.  Asthma control goals:  Full participation in all desired activities (may need albuterol before activity) Albuterol use two time or less a week on average (not counting use with activity) Cough interfering with sleep two time or less a month Oral steroids no more than once a year No hospitalizations  Allergic rhinitis with conjunctivitis  - continue allergen avoidance measures for tree pollen and mold  - continue Xyzal 5mg  daily 1-2 tabs  - continue singulair as above  - continue nasal saline spray to help keep nose moisturized  - use 3 times a week (Mon, Wed, Fri) Flonase + Astelin (nasal antihistamine) 1 spray each nostril twice a day.  This helps with both nasal congestion and drainage.   - use Optivar or Bepreve 1 drop each eye twice a day as needed for itchy/watery eyes  - recommend after getting in from being outside to wash/wipe of face and eyes  Food allergy vs intolerance  - you have had serum IgE detectable levels to wheat and honey however they are low  - continue avoidance of honey and wheat in the diet  - have access to self-injectable epinephrine (Epipen or AuviQ) 0.3mg  at all  times  - follow emergency action plan in case of allergic reaction  Follow-up in 6 months or sooner if needed  I appreciate the opportunity to take part in Jessica Snow's care. Please do not hesitate to contact me with questions.  Sincerely,   Margo Aye, MD Allergy/Immunology [facility]

## 2023-07-23 ENCOUNTER — Encounter: Payer: Self-pay | Admitting: *Deleted

## 2023-07-23 NOTE — Progress Notes (Signed)
Pt attended 06/24/23 screening event where her b/p was 118/88 and her blood sugar was 111. At the event, the pt confirmed her PCP is Dr. Dorothyann Peng at [facility] and she did not identify any SDOH insecurities. Chart review indicates pt had an office visit with labs with Dr. Allyne Gee on 05/29/23, where her A1C was 7.1. Pt has future appt with Dr. Allyne Gee on 09/02/23 and on 02/19/24. Chart review also indicates that pt has ongoing support from the allergy and asthma specialists. In-basket message sent to pt's PCP with 8/27 event results. No additional health equity team support indicated at this time.

## 2023-07-24 ENCOUNTER — Encounter: Payer: Self-pay | Admitting: Internal Medicine

## 2023-07-25 ENCOUNTER — Other Ambulatory Visit: Payer: Self-pay | Admitting: Internal Medicine

## 2023-08-19 ENCOUNTER — Other Ambulatory Visit (HOSPITAL_COMMUNITY): Payer: BC Managed Care – PPO

## 2023-08-29 ENCOUNTER — Ambulatory Visit: Payer: BC Managed Care – PPO | Admitting: Cardiology

## 2023-09-02 ENCOUNTER — Encounter: Payer: Self-pay | Admitting: Internal Medicine

## 2023-09-02 ENCOUNTER — Ambulatory Visit: Payer: BC Managed Care – PPO | Admitting: Internal Medicine

## 2023-09-02 VITALS — BP 130/84 | HR 73 | Temp 98.2°F | Ht 63.0 in | Wt 269.0 lb

## 2023-09-02 DIAGNOSIS — Z8679 Personal history of other diseases of the circulatory system: Secondary | ICD-10-CM

## 2023-09-02 DIAGNOSIS — E1169 Type 2 diabetes mellitus with other specified complication: Secondary | ICD-10-CM | POA: Diagnosis not present

## 2023-09-02 DIAGNOSIS — I110 Hypertensive heart disease with heart failure: Secondary | ICD-10-CM | POA: Diagnosis not present

## 2023-09-02 DIAGNOSIS — E7800 Pure hypercholesterolemia, unspecified: Secondary | ICD-10-CM | POA: Diagnosis not present

## 2023-09-02 DIAGNOSIS — I5032 Chronic diastolic (congestive) heart failure: Secondary | ICD-10-CM | POA: Diagnosis not present

## 2023-09-02 DIAGNOSIS — Z6841 Body Mass Index (BMI) 40.0 and over, adult: Secondary | ICD-10-CM

## 2023-09-02 DIAGNOSIS — J4550 Severe persistent asthma, uncomplicated: Secondary | ICD-10-CM

## 2023-09-02 DIAGNOSIS — E66813 Obesity, class 3: Secondary | ICD-10-CM

## 2023-09-02 MED ORDER — OZEMPIC (2 MG/DOSE) 8 MG/3ML ~~LOC~~ SOPN: PEN_INJECTOR | SUBCUTANEOUS | 3 refills | Status: DC

## 2023-09-02 NOTE — Progress Notes (Signed)
I,Jessica Snow, CMA,acting as a Neurosurgeon for Jessica Aliment, MD.,have documented all relevant documentation on the behalf of Jessica Aliment, MD,as directed by  Jessica Aliment, MD while in the presence of Jessica Aliment, MD.  Subjective:  Patient ID: Jessica Snow , female    DOB: Mar 25, 1966 , 57 y.o.   MRN: [id number]  Chief Complaint  Patient presents with   Diabetes   Hypertension   Hyperlipidemia    HPI  The patient is here today for a follow-up on diabetes, HTN & cholesterol. She reports compliance with meds. She reports having a dry cough & wheezing since starting back at work. (She has been out due to subarachnoid hemorrhage)  She reports having DM eye exam scheduled for this Friday.     Diabetes She presents for her follow-up diabetic visit. She has type 2 diabetes mellitus. There are no diabetic associated symptoms. Pertinent negatives for diabetes include no blurred vision, no chest pain, no polydipsia, no polyphagia, no polyuria and no weakness. There are no hypoglycemic complications. Risk factors for coronary artery disease include diabetes mellitus, obesity, hypertension and sedentary lifestyle.  Hypertension This is a chronic problem. The current episode started more than 1 year ago. The problem has been gradually improving since onset. The problem is uncontrolled. Pertinent negatives include no blurred vision, chest pain, palpitations or shortness of breath. Past treatments include angiotensin blockers and diuretics. The current treatment provides mild improvement. Compliance problems include exercise.      Past Medical History:  Diagnosis Date   Ascending aorta dilatation (HCC)    Asthma    Chronic diastolic CHF (congestive heart failure) (HCC)    Chronic rhinitis    Cough    Dilation of pulmonary artery (HCC)    Edema, lower extremity    GERD (gastroesophageal reflux disease)    HTN (hypertension)    Mild aortic insufficiency    Morbid obesity (HCC)     Murmur    Pneumonia    Pre-diabetes      Family History  Problem Relation Age of Onset   Hypertension Mother    Diabetes Mother    Heart disease Mother    Sleep apnea Mother    Obesity Mother    Hypertension Father    Anemia Father    Breast cancer Neg Hx      Current Outpatient Medications:    albuterol (VENTOLIN HFA) 108 (90 Base) MCG/ACT inhaler, TAKE 2 PUFFS BY MOUTH EVERY 4 TO 6 HOURS AS NEEDED, Disp: 18 each, Rfl: 1   amLODipine (NORVASC) 10 MG tablet, Take 1 tablet (10 mg total) by mouth daily., Disp: 90 tablet, Rfl: 3   atorvastatin (LIPITOR) 20 MG tablet, One tab po every day, except Sundays, Disp: 30 tablet, Rfl: 11   azelastine (OPTIVAR) 0.05 % ophthalmic solution, Place 1 drop into both eyes 2 (two) times daily as needed (itchy/watery eyes)., Disp: 6 mL, Rfl: 5   Budeson-Glycopyrrol-Formoterol (BREZTRI AEROSPHERE) 160-9-4.8 MCG/ACT AERO, Inhale 2 puffs into the lungs in the morning and at bedtime., Disp: 10.7 g, Rfl: 5   docusate sodium (COLACE) 100 MG capsule, Take 100 mg by mouth 2 (two) times daily., Disp: , Rfl:    EPINEPHrine 0.3 mg/0.3 mL IJ SOAJ injection, SMARTSIG:0.3 Milligram(s) IM Once PRN, Disp: 1 each, Rfl: 2   ergocalciferol (VITAMIN D2) 1.25 MG (50000 UT) capsule, Take by mouth., Disp: , Rfl:    famotidine (PEPCID) 20 MG tablet, TAKE 1 TABLET BY MOUTH EVERYDAY AT  BEDTIME, Disp: 90 tablet, Rfl: 0   gabapentin (NEURONTIN) 100 MG capsule, Take by mouth., Disp: , Rfl:    levocetirizine (XYZAL) 5 MG tablet, TAKE 1 TABLET BY MOUTH EVERY DAY IN THE EVENING, Disp: 90 tablet, Rfl: 1   melatonin 1 MG TABS tablet, Take by mouth., Disp: , Rfl:    montelukast (SINGULAIR) 10 MG tablet, TAKE 1 TABLET BY MOUTH EVERYDAY AT BEDTIME, Disp: 90 tablet, Rfl: 1   NURTEC 75 MG TBDP, Take by mouth., Disp: , Rfl:    Olopatadine HCl 0.2 % SOLN, Apply 1 drop to eye daily., Disp: 2.5 mL, Rfl: 5   pantoprazole (PROTONIX) 40 MG tablet, Take 1 tablet by mouth daily, Disp: 180 tablet,  Rfl: 0   Semaglutide, 2 MG/DOSE, (OZEMPIC, 2 MG/DOSE,) 8 MG/3ML SOPN, Inject 2mg  sq qweekly, Disp: 3 mL, Rfl: 3   spironolactone (ALDACTONE) 50 MG tablet, TAKE 1 TABLET BY MOUTH EVERY DAY, Disp: 90 tablet, Rfl: 3   tiZANidine (ZANAFLEX) 4 MG tablet, ONE TAB BY MOUTH DAILY AS NEEDED, Disp: 90 tablet, Rfl: 1   Azelastine-Fluticasone 137-50 MCG/ACT SUSP, Place 1 spray into the nose 2 (two) times daily., Disp: 23 g, Rfl: 5   Cholecalciferol 50 MCG (2000 UT) TABS, Take by mouth., Disp: , Rfl:    nystatin (MYCOSTATIN/NYSTOP) powder, APPLY 1 APPLICATION TOPICALLY TWICE A DAY AS NEEDED, Disp: 30 g, Rfl: 1   nystatin cream (MYCOSTATIN), Apply 1 Application topically 2 (two) times daily., Disp: 30 g, Rfl: 2   olmesartan (BENICAR) 20 MG tablet, Take 20 mg by mouth daily., Disp: , Rfl:    Sennosides 17.2 MG TABS, Take by mouth., Disp: , Rfl:   Current Facility-Administered Medications:    Benralizumab SOSY 30 mg, 30 mg, Subcutaneous, Q8 weeks, Padgett, Pilar Grammes, MD, 30 mg at 01/15/23 1610   Allergies  Allergen Reactions   Honey Bee Treatment [Bee Venom]    Wheat      Review of Systems  Constitutional: Negative.   Eyes:  Negative for blurred vision.  Respiratory: Negative.  Negative for shortness of breath.   Cardiovascular: Negative.  Negative for chest pain and palpitations.  Gastrointestinal: Negative.   Endocrine: Negative for polydipsia, polyphagia and polyuria.  Neurological: Negative.  Negative for weakness.  Psychiatric/Behavioral: Negative.       Today's Vitals   09/02/23 1156  BP: 130/84  Pulse: 73  Temp: 98.2 F (36.8 C)  SpO2: 98%  Weight: 269 lb (122 kg)  Height: 5\' 3"  (1.6 m)   Body mass index is 47.65 kg/m.  Wt Readings from Last 3 Encounters:  09/05/23 272 lb (123.4 kg)  09/02/23 269 lb (122 kg)  07/17/23 274 lb 6.4 oz (124.5 kg)     Objective:  Physical Exam Vitals and nursing note reviewed.  Constitutional:      Appearance: Normal appearance. She is  obese.  HENT:     Head: Normocephalic and atraumatic.  Eyes:     Extraocular Movements: Extraocular movements intact.  Cardiovascular:     Rate and Rhythm: Normal rate and regular rhythm.     Heart sounds: Normal heart sounds.  Pulmonary:     Effort: Pulmonary effort is normal.     Breath sounds: Normal breath sounds.  Musculoskeletal:     Cervical back: Normal range of motion.  Skin:    General: Skin is warm.  Neurological:     General: No focal deficit present.     Mental Status: She is alert.  Psychiatric:  Mood and Affect: Mood normal.        Behavior: Behavior normal.         Assessment And Plan:  Type 2 diabetes mellitus with other specified complication, without long-term current use of insulin (HCC) Assessment & Plan: Chronic, currently on Ozempic 1mg  weekly. She is encouraged to optimize her protein intake to decrease risk of muscle wasting. Will consider addition of SGLT2i in the future. She will rto in 3-4 months for re-evaluation.   Orders: -     Lipid panel -     Hemoglobin A1c -     BMP8+eGFR  Hypertensive heart disease with chronic diastolic congestive heart failure (HCC) Assessment & Plan: Chronic,fair control. Goal BP<120/80.  The importance of dietary/medication compliance was discussed with the patient. She will continue with amlodipine 10mg , spironolactone 50mg , and olmesartan 20mg  daily. She is encouraged to decrease her intake of processed foods, which tend to be high in sodium.   Orders: -     Lipid panel -     BMP8+eGFR  Pure hypercholesterolemia Assessment & Plan: Chronic, LDL goal < 70.  She will continue with atorvastatin 20mg  daily. She is encouraged to follow a heart healthy diet.   Orders: -     Lipid panel -     ALT  Severe persistent asthma without complication Assessment & Plan: Chronic, encouraged to avoid known triggers. No wheezing present on exam today. She is encouraged to take meds as prescribed and to keep albuterol  inhaler close by while at work.    Class 3 severe obesity due to excess calories with serious comorbidity and body mass index (BMI) of 45.0 to 49.9 in adult [facility]) Assessment & Plan: Chronic, BMI 47.  She has lost 2 lbs since September 2024.   She is encouraged to gradually increase her daily activity, aiming for at least 150 minutes per week.    History of subarachnoid hemorrhage  Other orders -     Ozempic (2 MG/DOSE); Inject 2mg  sq qweekly  Dispense: 3 mL; Refill: 3  She is encouraged to strive for BMI less than 30 to decrease cardiac risk. Advised to aim for at least 150 minutes of exercise per week.    Return in 3 months (on 12/03/2023), or dm check,.  Patient was given opportunity to ask questions. Patient verbalized understanding of the plan and was able to repeat key elements of the plan. All questions were answered to their satisfaction.    I, Jessica Aliment, MD, have reviewed all documentation for this visit. The documentation on 09/02/23 for the exam, diagnosis, procedures, and orders are all accurate and complete.   IF YOU HAVE BEEN REFERRED TO A SPECIALIST, IT MAY TAKE 1-2 WEEKS TO SCHEDULE/PROCESS THE REFERRAL. IF YOU HAVE NOT HEARD FROM US/SPECIALIST IN TWO WEEKS, PLEASE GIVE [place] A CALL AT [phone].   THE PATIENT IS ENCOURAGED TO PRACTICE SOCIAL DISTANCING DUE TO THE COVID-19 PANDEMIC.

## 2023-09-02 NOTE — Patient Instructions (Signed)

## 2023-09-03 LAB — BMP8+EGFR
BUN/Creatinine Ratio: 17 (ref 9–23)
BUN: 11 mg/dL (ref 6–24)
CO2: 25 mmol/L (ref 20–29)
Calcium: 9.5 mg/dL (ref 8.7–10.2)
Chloride: 106 mmol/L (ref 96–106)
Creatinine, Ser: 0.64 mg/dL (ref 0.57–1.00)
Glucose: 68 mg/dL — ABNORMAL LOW (ref 70–99)
Potassium: 4.7 mmol/L (ref 3.5–5.2)
Sodium: 144 mmol/L (ref 134–144)
eGFR: 104 mL/min/{1.73_m2} (ref >59)

## 2023-09-03 LAB — LIPID PANEL
Chol/HDL Ratio: 3.1 ratio (ref 0.0–4.4)
Cholesterol, Total: 147 mg/dL (ref 100–199)
HDL: 47 mg/dL (ref >39)
LDL Chol Calc (NIH): 82 mg/dL (ref 0–99)
Triglycerides: 96 mg/dL (ref 0–149)
VLDL Cholesterol Cal: 18 mg/dL (ref 5–40)

## 2023-09-03 LAB — ALT: ALT: 21 [IU]/L (ref 0–32)

## 2023-09-03 LAB — HEMOGLOBIN A1C
Est. average glucose Bld gHb Est-mCnc: 148 mg/dL
Hgb A1c MFr Bld: 6.8 % — ABNORMAL HIGH (ref 4.8–5.6)

## 2023-09-05 ENCOUNTER — Encounter (HOSPITAL_BASED_OUTPATIENT_CLINIC_OR_DEPARTMENT_OTHER): Payer: Self-pay | Admitting: Cardiology

## 2023-09-05 ENCOUNTER — Ambulatory Visit (INDEPENDENT_AMBULATORY_CARE_PROVIDER_SITE_OTHER): Payer: BC Managed Care – PPO | Admitting: Cardiology

## 2023-09-05 VITALS — BP 110/80 | HR 71 | Ht 63.0 in | Wt 272.0 lb

## 2023-09-05 DIAGNOSIS — I10 Essential (primary) hypertension: Secondary | ICD-10-CM

## 2023-09-05 DIAGNOSIS — I609 Nontraumatic subarachnoid hemorrhage, unspecified: Secondary | ICD-10-CM | POA: Diagnosis not present

## 2023-09-05 DIAGNOSIS — E66813 Obesity, class 3: Secondary | ICD-10-CM

## 2023-09-05 DIAGNOSIS — I5189 Other ill-defined heart diseases: Secondary | ICD-10-CM

## 2023-09-05 DIAGNOSIS — I5032 Chronic diastolic (congestive) heart failure: Secondary | ICD-10-CM | POA: Diagnosis not present

## 2023-09-05 DIAGNOSIS — I77810 Thoracic aortic ectasia: Secondary | ICD-10-CM

## 2023-09-05 DIAGNOSIS — Z6841 Body Mass Index (BMI) 40.0 and over, adult: Secondary | ICD-10-CM

## 2023-09-05 NOTE — Progress Notes (Signed)
Cardiology Office Note:  .   Date:  09/05/2023  ID:  Jessica Snow, DOB 09/09/1966, MRN [id number] PCP: Dorothyann Peng, MD  [facility] Providers Cardiologist:  Jodelle Red, MD Sleep Medicine:  Armanda Magic, MD {  History of Present Illness: .   Jessica Snow is a 57 y.o. 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 She was previously seen by Dr. Delton See and then by Dr. Shari Prows and established care with me on 09/05/23  Pertinent CV history: Previous CTA 2019 with coronary calcium score of 0, mild AI, ascending aortic dilation 39mm by CTA 2020. 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  Today: Doing well overall. Rare sharp left pectoral pain, better when she massages it. Recently started on cholesterol medication. Blood pressure well controlled.  ROS: Denies chest pain, shortness of breath at rest or with normal exertion. No PND, orthopnea, LE edema or unexpected weight gain. No syncope or palpitations. ROS otherwise negative except as noted.   Studies Reviewed: Marland Kitchen    EKG:  EKG Interpretation Date/Time:  Friday September 05 2023 09:24:15 EST Ventricular Rate:  71 PR Interval:  180 QRS Duration:  96 QT Interval:  432 QTC Calculation: 469 R Axis:   -11  Text Interpretation: Normal sinus rhythm Incomplete right bundle branch block Moderate voltage criteria for LVH, may be normal variant Septal infarct , age undetermined Confirmed by Jodelle Red [phone]) on 09/05/2023 9:58:31 AM    Physical Exam:   VS:  BP 110/80 (BP Location: Left Arm, Patient Position: Sitting, Cuff Size: Large)   Pulse 71   Ht 5\' 3"  (1.6 m)   Wt 272 lb (123.4 kg)   LMP 12/19/2005   BMI 48.18 kg/m    Wt Readings from Last 3 Encounters:  09/05/23 272 lb (123.4 kg)  09/02/23 269 lb (122 kg)  07/17/23 274 lb 6.4 oz (124.5 kg)    GEN: Well nourished, well developed in no  acute distress HEENT: Normal, moist mucous membranes NECK: No JVD CARDIAC: regular rhythm, normal S1 and S2, no rubs or gallops. 1/6 systolic murmur. VASCULAR: Radial and DP pulses 2+ bilaterally. No carotid bruits RESPIRATORY:  Clear to auscultation without rales, wheezing or rhonchi  ABDOMEN: Soft, non-tender, non-distended MUSCULOSKELETAL:  Ambulates independently SKIN: Warm and dry, no edema NEUROLOGIC:  Alert and oriented x 3. No focal neuro deficits noted. PSYCHIATRIC:  Normal affect    ASSESSMENT AND PLAN: .    History of SAH: R ACA Aneurysm: -Occurred in 12/2022 after episode of coughing -Underwent successful coiling  Diastolic dysfunction: Doing well and euvolemic. -Continue spironolactone 50mg  daily -reviewed salt avoidance -discussed SGLT2i. After shared decision making, will hold on this for now.   hypertension: Well controlled -Continue spironolactone 50mg  daily -Continue amlodipine 10mg  daily -she is no longer on olmesartan-hydrochlorothiazide, now on olmesartan alone. She thinks this is 20 mg dose, will call us and let us know if it's a different -no longer on furosemide  Obesity -BMI 48 -On Ozempic  Hyperlipidemia -on atorvastatin   OSA -on CPAP, follows with Dr. Mayford Knife   Thoracic Aortic Aneurysm Measures 40mm on TTE 07/2022. Was 39 mm on MRI 07/2021 -will order echo to follow annually  CV risk counseling and prevention -recommend heart healthy/Mediterranean diet, with whole grains, fruits, vegetable, fish, lean meats, nuts, and olive oil. Limit salt. -recommend moderate walking, 3-5 times/week for 30-50 minutes each session. Aim for  at least 150 minutes.week. Goal should be pace of 3 miles/hours, or walking 1.5 miles in 30 minutes -recommend avoidance of tobacco products. Avoid excess alcohol. -ASCVD risk score: The 10-year ASCVD risk score (Arnett DK, et al., 2019) is: 11.7%   Values used to calculate the score:     Age: 61 years     Sex:  Female     Is Non-Hispanic African American: Yes     Diabetic: Yes     Tobacco smoker: Yes     Systolic Blood Pressure: 110 mmHg     Is BP treated: Yes     HDL Cholesterol: 47 mg/dL     Total Cholesterol: 147 mg/dL    Dispo: 1 year or sooner as needed  Signed, Jodelle Red, MD   Jodelle Red, MD, PhD, [facility]  [facility]  [place]  Heart & Vascular at [facility] at [facility] [address], [address] [place], [place] [address] [phone]

## 2023-09-05 NOTE — Patient Instructions (Addendum)
Medication Instructions:  Your physician recommends that you continue on your current medications as directed. Please refer to the Current Medication list given to you today.  *If you need a refill on your cardiac medications before your next appointment, please call your pharmacy*  Lab Work: NONE  Testing/Procedures: Your physician has requested that you have an echocardiogram. Echocardiography is a painless test that uses sound waves to create images of your heart. It provides your doctor with information about the size and shape of your heart and how well your heart's chambers and valves are working. This procedure takes approximately one hour. There are no restrictions for this procedure. Please do NOT wear cologne, perfume, aftershave, or lotions (deodorant is allowed). Please arrive 15 minutes prior to your appointment time.  Please note: We ask at that you not bring children with you during ultrasound (echo/ vascular) testing. Due to room size and safety concerns, children are not allowed in the ultrasound rooms during exams. Our front office staff cannot provide observation of children in our lobby area while testing is being conducted. An adult accompanying a patient to their appointment will only be allowed in the ultrasound room at the discretion of the ultrasound technician under special circumstances. We apologize for any inconvenience.  Follow-Up: At [facility], you and your health needs are our priority.  As part of our continuing mission to provide you with exceptional heart care, we have created designated Provider Care Teams.  These Care Teams include your primary Cardiologist (physician) and Advanced Practice Providers (APPs -  Physician Assistants and Nurse Practitioners) who all work together to provide you with the care you need, when you need it.  We recommend signing up for the patient portal called "MyChart".  Sign up information is provided on this After Visit  Summary.  MyChart is used to connect with patients for Virtual Visits (Telemedicine).  Patients are able to view lab/test results, encounter notes, upcoming appointments, etc.  Non-urgent messages can be sent to your provider as well.   To learn more about what you can do with MyChart, go to [email or web address].    Your next appointment:   12 month(s)  The format for your next appointment:   In Person  Provider:   Jodelle Red, MD or Ronn Melena NP

## 2023-09-08 ENCOUNTER — Encounter: Payer: Self-pay | Admitting: Internal Medicine

## 2023-09-08 NOTE — Assessment & Plan Note (Signed)
Chronic, BMI 47.  She has lost 2 lbs since September 2024.   She is encouraged to gradually increase her daily activity, aiming for at least 150 minutes per week.

## 2023-09-08 NOTE — Assessment & Plan Note (Signed)
Chronic,fair control. Goal BP<120/80.  The importance of dietary/medication compliance was discussed with the patient. She will continue with amlodipine 10mg , spironolactone 50mg , and olmesartan 20mg  daily. She is encouraged to decrease her intake of processed foods, which tend to be high in sodium.

## 2023-09-08 NOTE — Assessment & Plan Note (Signed)
Chronic, encouraged to avoid known triggers. No wheezing present on exam today. She is encouraged to take meds as prescribed and to keep albuterol inhaler close by while at work.

## 2023-09-08 NOTE — Assessment & Plan Note (Signed)
Chronic, currently on Ozempic 1mg  weekly. She is encouraged to optimize her protein intake to decrease risk of muscle wasting. Will consider addition of SGLT2i in the future. She will rto in 3-4 months for re-evaluation.

## 2023-09-08 NOTE — Assessment & Plan Note (Signed)
Chronic, LDL goal < 70.  She will continue with atorvastatin 20mg  daily. She is encouraged to follow a heart healthy diet.

## 2023-09-24 ENCOUNTER — Other Ambulatory Visit (HOSPITAL_BASED_OUTPATIENT_CLINIC_OR_DEPARTMENT_OTHER): Payer: BC Managed Care – PPO

## 2023-09-24 DIAGNOSIS — I77810 Thoracic aortic ectasia: Secondary | ICD-10-CM | POA: Diagnosis not present

## 2023-09-24 LAB — ECHOCARDIOGRAM COMPLETE
Area-P 1/2: 3.72 cm2
P 1/2 time: 256 ms
S' Lateral: 2.42 cm

## 2023-11-03 ENCOUNTER — Other Ambulatory Visit: Payer: Self-pay

## 2023-11-03 DIAGNOSIS — R55 Syncope and collapse: Secondary | ICD-10-CM

## 2023-11-03 DIAGNOSIS — I77810 Thoracic aortic ectasia: Secondary | ICD-10-CM

## 2023-11-03 DIAGNOSIS — G4733 Obstructive sleep apnea (adult) (pediatric): Secondary | ICD-10-CM

## 2023-11-03 DIAGNOSIS — I10 Essential (primary) hypertension: Secondary | ICD-10-CM

## 2023-11-03 DIAGNOSIS — I5032 Chronic diastolic (congestive) heart failure: Secondary | ICD-10-CM

## 2023-11-03 MED ORDER — AMLODIPINE BESYLATE 10 MG PO TABS: 10.0000 mg | ORAL_TABLET | Freq: Every day | ORAL | 3 refills | Status: AC

## 2023-11-07 ENCOUNTER — Other Ambulatory Visit: Payer: Self-pay | Admitting: Allergy

## 2023-11-17 ENCOUNTER — Encounter (HOSPITAL_BASED_OUTPATIENT_CLINIC_OR_DEPARTMENT_OTHER): Payer: Self-pay

## 2023-11-18 ENCOUNTER — Other Ambulatory Visit: Payer: Self-pay | Admitting: *Deleted

## 2023-11-18 DIAGNOSIS — I351 Nonrheumatic aortic (valve) insufficiency: Secondary | ICD-10-CM

## 2023-11-24 LAB — HM DIABETES EYE EXAM

## 2023-12-04 ENCOUNTER — Ambulatory Visit: Payer: BC Managed Care – PPO | Admitting: Internal Medicine

## 2024-01-15 ENCOUNTER — Ambulatory Visit: Payer: Self-pay | Admitting: Allergy

## 2024-01-21 ENCOUNTER — Other Ambulatory Visit: Payer: Self-pay | Admitting: Internal Medicine

## 2024-01-21 DIAGNOSIS — E1169 Type 2 diabetes mellitus with other specified complication: Secondary | ICD-10-CM

## 2024-02-03 ENCOUNTER — Other Ambulatory Visit: Payer: Self-pay | Admitting: Internal Medicine

## 2024-02-13 ENCOUNTER — Other Ambulatory Visit: Payer: Self-pay | Admitting: Nurse Practitioner

## 2024-02-19 ENCOUNTER — Encounter: Payer: Self-pay | Admitting: Internal Medicine

## 2024-02-19 ENCOUNTER — Ambulatory Visit (INDEPENDENT_AMBULATORY_CARE_PROVIDER_SITE_OTHER): Payer: BC Managed Care – PPO | Admitting: Internal Medicine

## 2024-02-19 VITALS — BP 130/80 | HR 91 | Temp 98.7°F | Ht 63.0 in | Wt 275.2 lb

## 2024-02-19 DIAGNOSIS — E1169 Type 2 diabetes mellitus with other specified complication: Secondary | ICD-10-CM

## 2024-02-19 DIAGNOSIS — B353 Tinea pedis: Secondary | ICD-10-CM

## 2024-02-19 DIAGNOSIS — J4550 Severe persistent asthma, uncomplicated: Secondary | ICD-10-CM

## 2024-02-19 DIAGNOSIS — I110 Hypertensive heart disease with heart failure: Secondary | ICD-10-CM

## 2024-02-19 DIAGNOSIS — R519 Headache, unspecified: Secondary | ICD-10-CM

## 2024-02-19 DIAGNOSIS — Z6841 Body Mass Index (BMI) 40.0 and over, adult: Secondary | ICD-10-CM

## 2024-02-19 DIAGNOSIS — I5032 Chronic diastolic (congestive) heart failure: Secondary | ICD-10-CM | POA: Diagnosis not present

## 2024-02-19 DIAGNOSIS — Z91030 Bee allergy status: Secondary | ICD-10-CM

## 2024-02-19 DIAGNOSIS — Z0000 Encounter for general adult medical examination without abnormal findings: Secondary | ICD-10-CM | POA: Insufficient documentation

## 2024-02-19 DIAGNOSIS — E66813 Obesity, class 3: Secondary | ICD-10-CM

## 2024-02-19 DIAGNOSIS — G8929 Other chronic pain: Secondary | ICD-10-CM

## 2024-02-19 DIAGNOSIS — E7800 Pure hypercholesterolemia, unspecified: Secondary | ICD-10-CM | POA: Diagnosis not present

## 2024-02-19 DIAGNOSIS — Z23 Encounter for immunization: Secondary | ICD-10-CM

## 2024-02-19 DIAGNOSIS — J310 Chronic rhinitis: Secondary | ICD-10-CM

## 2024-02-19 LAB — POCT URINALYSIS DIPSTICK
Bilirubin, UA: NEGATIVE
Blood, UA: NEGATIVE
Glucose, UA: NEGATIVE
Ketones, UA: NEGATIVE
Leukocytes, UA: NEGATIVE
Nitrite, UA: NEGATIVE
Protein, UA: NEGATIVE
Spec Grav, UA: 1.025 (ref 1.010–1.025)
Urobilinogen, UA: 0.2 U/dL
pH, UA: 6.5 (ref 5.0–8.0)

## 2024-02-19 MED ORDER — NYSTATIN 100000 UNIT/GM EX POWD: Freq: Two times a day (BID) | CUTANEOUS | 1 refills | Status: AC

## 2024-02-19 NOTE — Assessment & Plan Note (Addendum)
 Chronic, fair control. Goal BP<130/80.  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 Blood pressure initially at 152 mmHg, likely stress-related. Emphasized lifestyle and medication adherence for control. - Encourage regular exercise. - Advise on dietary modifications to reduce sodium intake. - Continue current antihypertensive medications. - Continue with amlodipine  10mg  daily, spironolactone  50mg  daily and olmesartan  20mg 

## 2024-02-19 NOTE — Progress Notes (Signed)
 I,Jessica Snow, CMA,acting as a Neurosurgeon for Jessica Dung, MD.,have documented all relevant documentation on the behalf of Jessica Dung, MD,as directed by  Jessica Dung, MD while in the presence of Jessica Dung, MD.  Subjective:    Patient ID: Jessica Snow , female    DOB: 1966-04-01 , 58 y.o.   MRN: [id number]  Chief Complaint  Patient presents with   Annual Exam    Patient presents today for annual exam. She reports compliance with medications. Denies headache, chest pain & sob.   Hypertension   Diabetes   Hyperlipidemia    HPI Discussed the use of AI scribe software for clinical note transcription with the patient, who gave verbal consent to proceed.  History of Present Illness Jessica Snow is a 57 year old female who presents for a routine physical exam and management of her chronic conditions.  She is managing her diabetes and hypertension with medications including amlodipine  10 mg daily, atorvastatin  20 mg daily except Sundays, spironolactone  50 mg daily, and Ozempic  2 mg weekly.   She experiences chronic headaches, described as a 'stiff head', and takes gabapentin  100 mg and a muscle relaxer at night due to drowsiness.   She has multiple allergies, including bee venom, wheat, and certain trees and shrubs, for which she carries an EpiPen . She uses Optivar  drops, a generic Dymista  nasal spray, Xyzal  nightly, and Singulair  for allergy  management.  She reports a rash for which she uses shea butter and athlete's foot spray to alleviate symptoms, though she has not yet seen a dermatologist.  She is making lifestyle changes by reducing fried foods, cutting back on sodas, and increasing exercise, including walking at a high school track. She is mindful of her fluid intake, primarily drinking water, coffee, and tea.  Her social history includes living with her family and having a pet dog. She mentions stress related to her mother's health, who has atrial  fibrillation and frequent hospital visits since December.       Hypertension This is a chronic problem. The current episode started more than 1 year ago. The problem has been gradually improving since onset. The problem is uncontrolled. Pertinent negatives include no blurred vision, chest pain, palpitations or shortness of breath. Past treatments include angiotensin blockers and diuretics. The current treatment provides mild improvement. Compliance problems include exercise.   Diabetes She presents for her follow-up diabetic visit. She has type 2 diabetes mellitus. There are no diabetic associated symptoms. Pertinent negatives for diabetes include no blurred vision, no chest pain, no polydipsia, no polyphagia, no polyuria and no weakness. There are no hypoglycemic complications. Risk factors for coronary artery disease include diabetes mellitus, obesity, hypertension and sedentary lifestyle.  Hyperlipidemia Pertinent negatives include no chest pain or shortness of breath.     Past Medical History:  Diagnosis Date   Ascending aorta dilatation (HCC)    Asthma    Chronic diastolic CHF (congestive heart failure) (HCC)    Chronic rhinitis    Cough    Dilation of pulmonary artery (HCC)    Edema, lower extremity    GERD (gastroesophageal reflux disease)    HTN (hypertension)    Mild aortic insufficiency    Morbid obesity (HCC)    Murmur    Pneumonia    Pre-diabetes      Family History  Problem Relation Age of Onset   Hypertension Mother    Diabetes Mother    Heart disease Mother    Sleep  apnea Mother    Obesity Mother    Hypertension Father    Anemia Father    Breast cancer Neg Hx      Current Outpatient Medications:    albuterol  (VENTOLIN  HFA) 108 (90 Base) MCG/ACT inhaler, TAKE 2 PUFFS BY MOUTH EVERY 4 TO 6 HOURS AS NEEDED, Disp: 18 each, Rfl: 1   amLODipine  (NORVASC ) 10 MG tablet, Take 1 tablet (10 mg total) by mouth daily., Disp: 90 tablet, Rfl: 3   azelastine  (OPTIVAR )  0.05 % ophthalmic solution, Place 1 drop into both eyes 2 (two) times daily as needed (itchy/watery eyes)., Disp: 6 mL, Rfl: 5   Azelastine -Fluticasone  137-50 MCG/ACT SUSP, Place 1 spray into the nose 2 (two) times daily., Disp: 23 g, Rfl: 5   Budeson-Glycopyrrol-Formoterol  (BREZTRI  AEROSPHERE) 160-9-4.8 MCG/ACT AERO, Inhale 2 puffs into the lungs in the morning and at bedtime., Disp: 10.7 g, Rfl: 5   Cholecalciferol 50 MCG (2000 UT) TABS, Take by mouth., Disp: , Rfl:    docusate sodium (COLACE) 100 MG capsule, Take 100 mg by mouth 2 (two) times daily., Disp: , Rfl:    EPINEPHrine  0.3 mg/0.3 mL IJ SOAJ injection, SMARTSIG:0.3 Milligram(s) IM Once PRN, Disp: 1 each, Rfl: 2   ergocalciferol  (VITAMIN D2) 1.25 MG (50000 UT) capsule, Take by mouth., Disp: , Rfl:    famotidine  (PEPCID ) 20 MG tablet, TAKE 1 TABLET BY MOUTH EVERYDAY AT BEDTIME, Disp: 90 tablet, Rfl: 0   fluconazole  (DIFLUCAN ) 150 MG tablet, Take once weekly x 4 weeks on Sundays, Disp: 4 tablet, Rfl: 0   gabapentin  (NEURONTIN ) 100 MG capsule, Take by mouth., Disp: , Rfl:    levocetirizine (XYZAL ) 5 MG tablet, TAKE 1 TABLET BY MOUTH EVERY DAY IN THE EVENING, Disp: 90 tablet, Rfl: 1   melatonin 1 MG TABS tablet, Take by mouth., Disp: , Rfl:    montelukast  (SINGULAIR ) 10 MG tablet, TAKE 1 TABLET BY MOUTH EVERYDAY AT BEDTIME, Disp: 90 tablet, Rfl: 1   NURTEC 75 MG TBDP, Take by mouth., Disp: , Rfl:    nystatin  cream (MYCOSTATIN ), Apply 1 Application topically 2 (two) times daily., Disp: 30 g, Rfl: 2   olmesartan  (BENICAR ) 20 MG tablet, Take 20 mg by mouth daily., Disp: , Rfl:    Olopatadine  HCl 0.2 % SOLN, Apply 1 drop to eye daily., Disp: 2.5 mL, Rfl: 5   pantoprazole  (PROTONIX ) 40 MG tablet, Take 1 tablet by mouth daily, Disp: 180 tablet, Rfl: 0   Semaglutide , 2 MG/DOSE, (OZEMPIC , 2 MG/DOSE,) 8 MG/3ML SOPN, Inject 2mg  sq qweekly, Disp: 3 mL, Rfl: 3   Sennosides 17.2 MG TABS, Take by mouth., Disp: , Rfl:    spironolactone  (ALDACTONE ) 50 MG  tablet, TAKE 1 TABLET BY MOUTH EVERY DAY, Disp: 90 tablet, Rfl: 3   tiZANidine  (ZANAFLEX ) 4 MG tablet, TAKE 1 TABLET BY MOUTH EVERY DAY AS NEEDED, Disp: 90 tablet, Rfl: 1   atorvastatin  (LIPITOR) 20 MG tablet, One tab po every day, Disp: 90 tablet, Rfl: 1   nystatin  (MYCOSTATIN /NYSTOP ) powder, Apply topically 2 (two) times daily., Disp: 30 g, Rfl: 1  Current Facility-Administered Medications:    Benralizumab  SOSY 30 mg, 30 mg, Subcutaneous, Q8 weeks, Padgett, Rhoderick Ceo, MD, 30 mg at 01/15/23 3244   Allergies  Allergen Reactions   Honey Bee Treatment [Bee Venom]    Wheat       The patient states she uses none for birth control. Patient's last menstrual period was 12/19/2005.. Negative for Dysmenorrhea. Negative for: breast discharge, breast lump(s),  breast pain and breast self exam. Associated symptoms include abnormal vaginal bleeding. Pertinent negatives include abnormal bleeding (hematology), anxiety, decreased libido, depression, difficulty falling sleep, dyspareunia, history of infertility, nocturia, sexual dysfunction, sleep disturbances, urinary incontinence, urinary urgency, vaginal discharge and vaginal itching. Diet regular.The patient states her exercise level is  intermittent.  . The patient's tobacco use is:  Social History   Tobacco Use  Smoking Status Former   Current packs/day: 0.00   Average packs/day: 0.3 packs/day for 10.0 years (2.5 ttl pk-yrs)   Types: Cigarettes   Start date: 01/27/2008   Quit date: 01/26/2018   Years since quitting: 6.0  Smokeless Tobacco Former  . She has been exposed to passive smoke. The patient's alcohol use is:  Social History   Substance and Sexual Activity  Alcohol Use No   Review of Systems  Constitutional: Negative.   HENT: Negative.    Eyes: Negative.  Negative for blurred vision.  Respiratory: Negative.  Negative for shortness of breath.   Cardiovascular: Negative.  Negative for chest pain and palpitations.   Gastrointestinal: Negative.   Endocrine: Negative.  Negative for polydipsia, polyphagia and polyuria.  Genitourinary: Negative.   Musculoskeletal: Negative.   Skin: Negative.   Allergic/Immunologic: Negative.   Neurological: Negative.  Negative for weakness.  Hematological: Negative.   Psychiatric/Behavioral: Negative.       Today's Vitals   02/19/24 1508  BP: 130/80  Pulse: 91  Temp: 98.7 F (37.1 C)  SpO2: 98%  Weight: 275 lb 3.2 oz (124.8 kg)  Height: 5\' 3"  (1.6 m)   Body mass index is 48.75 kg/m.  Wt Readings from Last 3 Encounters:  02/19/24 275 lb 3.2 oz (124.8 kg)  09/05/23 272 lb (123.4 kg)  09/02/23 269 lb (122 kg)     Objective:  Physical Exam Vitals and nursing note reviewed.  Constitutional:      Appearance: Normal appearance. She is obese.  HENT:     Head: Normocephalic and atraumatic.     Right Ear: Tympanic membrane, ear canal and external ear normal.     Left Ear: Tympanic membrane, ear canal and external ear normal.     Nose: Nose normal.     Mouth/Throat:     Mouth: Mucous membranes are moist.     Pharynx: Oropharynx is clear.  Eyes:     Extraocular Movements: Extraocular movements intact.     Conjunctiva/sclera: Conjunctivae normal.     Pupils: Pupils are equal, round, and reactive to light.  Cardiovascular:     Rate and Rhythm: Normal rate and regular rhythm.     Pulses: Normal pulses.          Dorsalis pedis pulses are 2+ on the right side and 2+ on the left side.     Heart sounds: Normal heart sounds.  Pulmonary:     Effort: Pulmonary effort is normal.     Breath sounds: Normal breath sounds.  Chest:  Breasts:    Tanner Score is 5.     Right: Normal.     Left: Normal.  Abdominal:     General: Bowel sounds are normal.     Palpations: Abdomen is soft.     Comments: Obese, soft  Genitourinary:    Comments: deferred Musculoskeletal:        General: Normal range of motion.     Cervical back: Normal range of motion and neck supple.   Feet:     Right foot:     Protective Sensation: 5 sites tested.  5 sites sensed.     Skin integrity: Dry skin present.     Toenail Condition: Right toenails are long.     Left foot:     Protective Sensation: 5 sites tested.  5 sites sensed.     Skin integrity: Dry skin present.     Toenail Condition: Left toenails are long.     Comments: Scaly feet b/l Interdigital erythema/macerations Skin:    General: Skin is warm and dry.  Neurological:     General: No focal deficit present.     Mental Status: She is alert and oriented to person, place, and time.     Cranial Nerves: No cranial nerve deficit.     Sensory: No sensory deficit.  Psychiatric:        Mood and Affect: Mood normal.        Behavior: Behavior normal.          Assessment And Plan:     Encounter for annual health examination Assessment & Plan: A full exam was performed.  Importance of monthly self breast exams was discussed with the patient.  She is advised to get 30-45 minutes of regular exercise, no less than four to five days per week. Both weight-bearing and aerobic exercises are recommended.  She is advised to follow a healthy diet with at least six fruits/veggies per day, decrease intake of red meat and other saturated fats and to increase fish intake to twice weekly.  Meats/fish should not be fried -- baked, boiled or broiled is preferable. It is also important to cut back on your sugar intake.  Be sure to read labels - try to avoid anything with added sugar, high fructose corn syrup or other sweeteners.  If you must use a sweetener, you can try stevia or monkfruit.  It is also important to avoid artificially sweetened foods/beverages and diet drinks. Lastly, wear SPF 50 sunscreen on exposed skin and when in direct sunlight for an extended period of time.  Be sure to avoid fast food restaurants and aim for at least 60 ounces of water daily.      Orders: -     CBC -     CMP14+EGFR -     Lipid panel -      Hemoglobin A1c -     TSH  Hypertensive heart disease with chronic diastolic congestive heart failure (HCC) Assessment & Plan: Chronic, fair control. Goal BP<130/80.  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 Blood pressure initially at 152 mmHg, likely stress-related. Emphasized lifestyle and medication adherence for control. - Encourage regular exercise. - Advise on dietary modifications to reduce sodium intake. - Continue current antihypertensive medications. - Continue with amlodipine  10mg  daily, spironolactone  50mg  daily and olmesartan  20mg   Orders: -     EKG 12-Lead -     POCT urinalysis dipstick -     Microalbumin / creatinine urine ratio  Type 2 diabetes mellitus with other specified complication, without long-term current use of insulin (HCC) Assessment & Plan: Chronic, diabetic foot exam was performed. She will continue with weekly Ozempic . Should consider adding SGLT2i for additional cardiac/renal protection. I DISCUSSED WITH THE PATIENT AT LENGTH REGARDING THE GOALS OF GLYCEMIC CONTROL AND POSSIBLE LONG-TERM COMPLICATIONS.  I  ALSO STRESSED THE IMPORTANCE OF COMPLIANCE WITH HOME GLUCOSE MONITORING, DIETARY RESTRICTIONS INCLUDING AVOIDANCE OF SUGARY DRINKS/PROCESSED FOODS,  ALONG WITH REGULAR EXERCISE.  I  ALSO STRESSED THE IMPORTANCE OF ANNUAL EYE EXAMS, SELF FOOT CARE AND COMPLIANCE WITH OFFICE  VISITS.   Orders: -     EKG 12-Lead -     POCT urinalysis dipstick -     Microalbumin / creatinine urine ratio  Pure hypercholesterolemia Assessment & Plan: Chronic, LDL goal < 70.  She will continue with atorvastatin  20mg  daily. She is encouraged to follow a heart healthy diet.    Severe persistent asthma without complication Assessment & Plan: Managed with Breztri  and albuterol . Advised mask use outdoors for pollen exposure. - Continue Breztri  and albuterol  inhalers as prescribed. - Advise wearing a mask outdoors during high pollen exposure. - Pulmonary  input is appreciated   Tinea pedis of both feet Assessment & Plan: Persistent rash, possibly fungal. Dermatology referral scheduled. Discussed earlier appointments via cancellations. - Refer to dermatologist for further evaluation and management. - Advise calling on Mondays for possible cancellations to expedite appointment. - I will start fluconazole  weekly x 3. Advised to stop chol meds day day before, day of and day after fluconazole  dosing. I suggest taking on Sundays, she can skip chol meds on Saturdays, Sundays and Monday while on fluconazole   Orders: -     Ambulatory referral to Dermatology  Chronic nonintractable headache, unspecified headache type Assessment & Plan: She is s/p SAH.  Managed with gabapentin  and muscle relaxers. MRI unremarkable for new findings. Discussed medication timing and impact. - Continue gabapentin  and muscle relaxers as prescribed. - Neurology input is appreciated   Chronic rhinitis Assessment & Plan: Managed with Optivar , Dymista , Xyzal , and Singulair . Advised mask use outdoors for pollen exposure. - Continue current allergy  medications. - Advise wearing a mask outdoors during high pollen exposure.   Class 3 severe obesity due to excess calories with serious comorbidity and body mass index (BMI) of 45.0 to 49.9 in adult [facility]) Assessment & Plan: Chronic, BMI 48.  She has lost 6 lbs since October 2024.   She is encouraged to gradually increase her daily activity, aiming for at least 150 minutes per week.    Bee allergy  status Assessment & Plan: Severe allergy . Emphasized EpiPen  availability. - Ensure EpiPen  is available at all times.   Immunization due -     Pneumococcal conjugate vaccine 20-valent  Other orders -     Nystatin ; Apply topically 2 (two) times daily.  Dispense: 30 g; Refill: 1 -     Fluconazole ; Take once weekly x 4 weeks on Sundays  Dispense: 4 tablet; Refill: 0    Return for 1 year hm, 4 month dm f/u.Aaron Aas Patient was given  opportunity to ask questions. Patient verbalized understanding of the plan and was able to repeat key elements of the plan. All questions were answered to their satisfaction.    I, Jessica Dung, MD, have reviewed all documentation for this visit. The documentation on 02/19/24 for the exam, diagnosis, procedures, and orders are all accurate and complete.

## 2024-02-19 NOTE — Assessment & Plan Note (Signed)

## 2024-02-19 NOTE — Patient Instructions (Signed)

## 2024-02-20 LAB — CMP14+EGFR
ALT: 19 IU/L (ref 0–32)
AST: 20 IU/L (ref 0–40)
Albumin: 4.2 g/dL (ref 3.8–4.9)
Alkaline Phosphatase: 90 IU/L (ref 44–121)
BUN/Creatinine Ratio: 13 (ref 9–23)
BUN: 12 mg/dL (ref 6–24)
Bilirubin Total: 0.6 mg/dL (ref 0.0–1.2)
CO2: 22 mmol/L (ref 20–29)
Calcium: 9.1 mg/dL (ref 8.7–10.2)
Chloride: 104 mmol/L (ref 96–106)
Creatinine, Ser: 0.89 mg/dL (ref 0.57–1.00)
Globulin, Total: 2.6 g/dL (ref 1.5–4.5)
Glucose: 84 mg/dL (ref 70–99)
Potassium: 4 mmol/L (ref 3.5–5.2)
Sodium: 140 mmol/L (ref 134–144)
Total Protein: 6.8 g/dL (ref 6.0–8.5)
eGFR: 76 mL/min/{1.73_m2} (ref >59)

## 2024-02-20 LAB — CBC
Hematocrit: 38.2 % (ref 34.0–46.6)
Hemoglobin: 11.9 g/dL (ref 11.1–15.9)
MCH: 24.5 pg — ABNORMAL LOW (ref 26.6–33.0)
MCHC: 31.2 g/dL — ABNORMAL LOW (ref 31.5–35.7)
MCV: 79 fL (ref 79–97)
Platelets: 275 10*3/uL (ref 150–450)
RBC: 4.86 x10E6/uL (ref 3.77–5.28)
RDW: 16.5 % — ABNORMAL HIGH (ref 11.7–15.4)
WBC: 8.2 10*3/uL (ref 3.4–10.8)

## 2024-02-20 LAB — LIPID PANEL
Chol/HDL Ratio: 3.4 ratio (ref 0.0–4.4)
Cholesterol, Total: 160 mg/dL (ref 100–199)
HDL: 47 mg/dL (ref >39)
LDL Chol Calc (NIH): 93 mg/dL (ref 0–99)
Triglycerides: 107 mg/dL (ref 0–149)
VLDL Cholesterol Cal: 20 mg/dL (ref 5–40)

## 2024-02-20 LAB — MICROALBUMIN / CREATININE URINE RATIO
Creatinine, Urine: 206 mg/dL
Microalb/Creat Ratio: 3 mg/g{creat} (ref 0–29)
Microalbumin, Urine: 6.6 ug/mL

## 2024-02-20 LAB — HEMOGLOBIN A1C
Est. average glucose Bld gHb Est-mCnc: 146 mg/dL
Hgb A1c MFr Bld: 6.7 % — ABNORMAL HIGH (ref 4.8–5.6)

## 2024-02-20 LAB — TSH: TSH: 0.653 u[IU]/mL (ref 0.450–4.500)

## 2024-02-23 ENCOUNTER — Encounter: Payer: Self-pay | Admitting: Internal Medicine

## 2024-02-23 ENCOUNTER — Other Ambulatory Visit: Payer: Self-pay | Admitting: Internal Medicine

## 2024-02-23 DIAGNOSIS — R519 Headache, unspecified: Secondary | ICD-10-CM | POA: Insufficient documentation

## 2024-02-23 DIAGNOSIS — E7800 Pure hypercholesterolemia, unspecified: Secondary | ICD-10-CM

## 2024-02-23 DIAGNOSIS — Z91030 Bee allergy status: Secondary | ICD-10-CM | POA: Insufficient documentation

## 2024-02-23 MED ORDER — ATORVASTATIN CALCIUM 20 MG PO TABS: ORAL_TABLET | ORAL | 1 refills | Status: AC

## 2024-02-23 MED ORDER — FLUCONAZOLE 150 MG PO TABS: ORAL_TABLET | ORAL | 0 refills | Status: DC

## 2024-02-23 NOTE — Assessment & Plan Note (Addendum)
 Persistent rash, possibly fungal. Dermatology referral scheduled. Discussed earlier appointments via cancellations. - Refer to dermatologist for further evaluation and management. - Advise calling on Mondays for possible cancellations to expedite appointment. - I will start fluconazole  weekly x 3. Advised to stop chol meds day day before, day of and day after fluconazole  dosing. I suggest taking on Sundays, she can skip chol meds on Saturdays, Sundays and Monday while on fluconazole 

## 2024-02-23 NOTE — Assessment & Plan Note (Signed)
 Chronic, BMI 48.  She has lost 6 lbs since October 2024.   She is encouraged to gradually increase her daily activity, aiming for at least 150 minutes per week.

## 2024-02-23 NOTE — Assessment & Plan Note (Signed)
 Chronic, diabetic foot exam was performed. She will continue with weekly Ozempic . Should consider adding SGLT2i for additional cardiac/renal protection. I DISCUSSED WITH THE PATIENT AT LENGTH REGARDING THE GOALS OF GLYCEMIC CONTROL AND POSSIBLE LONG-TERM COMPLICATIONS.  I  ALSO STRESSED THE IMPORTANCE OF COMPLIANCE WITH HOME GLUCOSE MONITORING, DIETARY RESTRICTIONS INCLUDING AVOIDANCE OF SUGARY DRINKS/PROCESSED FOODS,  ALONG WITH REGULAR EXERCISE.  I  ALSO STRESSED THE IMPORTANCE OF ANNUAL EYE EXAMS, SELF FOOT CARE AND COMPLIANCE WITH OFFICE VISITS.

## 2024-02-23 NOTE — Assessment & Plan Note (Signed)
Chronic, LDL goal < 70.  She will continue with atorvastatin 20mg  daily. She is encouraged to follow a heart healthy diet.

## 2024-02-23 NOTE — Assessment & Plan Note (Signed)
 Managed with Breztri  and albuterol . Advised mask use outdoors for pollen exposure. - Continue Breztri  and albuterol  inhalers as prescribed. - Advise wearing a mask outdoors during high pollen exposure. - Pulmonary input is appreciated

## 2024-02-23 NOTE — Assessment & Plan Note (Signed)
 Severe allergy . Emphasized EpiPen  availability. - Ensure EpiPen  is available at all times.

## 2024-02-23 NOTE — Assessment & Plan Note (Signed)
 Managed with Optivar , Dymista , Xyzal , and Singulair . Advised mask use outdoors for pollen exposure. - Continue current allergy  medications. - Advise wearing a mask outdoors during high pollen exposure.

## 2024-02-23 NOTE — Assessment & Plan Note (Signed)
 She is s/p SAH.  Managed with gabapentin  and muscle relaxers. MRI unremarkable for new findings. Discussed medication timing and impact. - Continue gabapentin  and muscle relaxers as prescribed. - Neurology input is appreciated

## 2024-03-24 ENCOUNTER — Ambulatory Visit (INDEPENDENT_AMBULATORY_CARE_PROVIDER_SITE_OTHER): Payer: Self-pay | Admitting: Allergy

## 2024-03-24 ENCOUNTER — Encounter: Payer: Self-pay | Admitting: Allergy

## 2024-03-24 VITALS — BP 138/86 | HR 64 | Resp 18

## 2024-03-24 DIAGNOSIS — T7800XA Anaphylactic reaction due to unspecified food, initial encounter: Secondary | ICD-10-CM

## 2024-03-24 DIAGNOSIS — J4550 Severe persistent asthma, uncomplicated: Secondary | ICD-10-CM

## 2024-03-24 DIAGNOSIS — J3089 Other allergic rhinitis: Secondary | ICD-10-CM

## 2024-03-24 DIAGNOSIS — H1013 Acute atopic conjunctivitis, bilateral: Secondary | ICD-10-CM

## 2024-03-24 DIAGNOSIS — T7800XD Anaphylactic reaction due to unspecified food, subsequent encounter: Secondary | ICD-10-CM

## 2024-03-24 MED ORDER — ALBUTEROL SULFATE HFA 108 (90 BASE) MCG/ACT IN AERS: INHALATION_SPRAY | RESPIRATORY_TRACT | 1 refills | Status: AC

## 2024-03-24 MED ORDER — AZELASTINE HCL 0.05 % OP SOLN: 1.0000 [drp] | Freq: Two times a day (BID) | OPHTHALMIC | 5 refills | Status: AC | PRN

## 2024-03-24 MED ORDER — MONTELUKAST SODIUM 10 MG PO TABS: ORAL_TABLET | ORAL | 1 refills | Status: DC

## 2024-03-24 MED ORDER — BREZTRI AEROSPHERE 160-9-4.8 MCG/ACT IN AERO: 2.0000 | INHALATION_SPRAY | Freq: Two times a day (BID) | RESPIRATORY_TRACT | 5 refills | Status: DC

## 2024-03-24 MED ORDER — LEVOCETIRIZINE DIHYDROCHLORIDE 5 MG PO TABS: 5.0000 mg | ORAL_TABLET | Freq: Every evening | ORAL | 1 refills | Status: DC
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
## 2024-03-25 LAB — CBC WITH DIFFERENTIAL/PLATELET
Basophils Absolute: 0 10*3/uL (ref 0.0–0.2)
Basos: 0 %
EOS (ABSOLUTE): 0.3 10*3/uL (ref 0.0–0.4)
Eos: 4 %
Hematocrit: 37.8 % (ref 34.0–46.6)
Hemoglobin: 11.5 g/dL (ref 11.1–15.9)
Immature Grans (Abs): 0 10*3/uL (ref 0.0–0.1)
Immature Granulocytes: 0 %
Lymphocytes Absolute: 3 10*3/uL (ref 0.7–3.1)
Lymphs: 43 %
MCH: 24.1 pg — ABNORMAL LOW (ref 26.6–33.0)
MCHC: 30.4 g/dL — ABNORMAL LOW (ref 31.5–35.7)
MCV: 79 fL (ref 79–97)
Monocytes Absolute: 0.4 10*3/uL (ref 0.1–0.9)
Monocytes: 6 %
Neutrophils Absolute: 3.2 10*3/uL (ref 1.4–7.0)
Neutrophils: 47 %
Platelets: 273 10*3/uL (ref 150–450)
RBC: 4.78 x10E6/uL (ref 3.77–5.28)
RDW: 16.6 % — ABNORMAL HIGH (ref 11.7–15.4)
WBC: 6.9 10*3/uL (ref 3.4–10.8)

## 2024-03-26 ENCOUNTER — Ambulatory Visit: Payer: Self-pay | Admitting: Allergy

## 2024-03-31 ENCOUNTER — Other Ambulatory Visit: Payer: Self-pay | Admitting: Internal Medicine

## 2024-04-02 ENCOUNTER — Other Ambulatory Visit: Payer: Self-pay | Admitting: Allergy

## 2024-04-07 NOTE — Progress Notes (Signed)
 Telephone call to patient regarding fasenra 

## 2024-04-07 NOTE — Telephone Encounter (Signed)
 L/m for patient to contact me to advise approval, copay card and submit to [employer] for Fasenra 

## 2024-04-07 NOTE — Telephone Encounter (Signed)
-----   Message from [employer] Padgett sent at 03/26/2024  4:36 PM EDT ----- Abs eos 300.  Please submit to restart Fasenra .

## 2024-04-13 ENCOUNTER — Telehealth: Payer: Self-pay | Admitting: Cardiology

## 2024-04-13 ENCOUNTER — Telehealth: Payer: Self-pay | Admitting: *Deleted

## 2024-04-13 NOTE — Telephone Encounter (Signed)
 Patient stated she is now seeing a sleep clinic in [facility].

## 2024-04-13 NOTE — Telephone Encounter (Signed)
 Patient called in and was advised of approval, copay card and submit to [employer]. Will reach out once delivery set to make appt to start. 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

## 2024-06-21 ENCOUNTER — Ambulatory Visit: Admitting: Internal Medicine

## 2024-06-21 NOTE — Progress Notes (Deleted)
 I,Jameka J Llittleton, CMA,acting as a Neurosurgeon for Catheryn LOISE Slocumb, MD.,have documented all relevant documentation on the behalf of Catheryn LOISE Slocumb, MD,as directed by  Catheryn LOISE Slocumb, MD while in the presence of Catheryn LOISE Slocumb, MD.  Subjective:  Patient ID: Jessica Snow , female    DOB: 1966-02-19 , 58 y.o.   MRN: [id number]  No chief complaint on file.   HPI  The patient is here today for a follow-up on diabetes, HTN & cholesterol. She reports compliance with meds. Patient denies having chest pain, sob or headaches at this time. Patient doesn't have any questions or concerns.   Diabetes She presents for her follow-up diabetic visit. She has type 2 diabetes mellitus. There are no diabetic associated symptoms. Pertinent negatives for diabetes include no blurred vision, no chest pain, no polydipsia, no polyphagia, no polyuria and no weakness. There are no hypoglycemic complications. Risk factors for coronary artery disease include diabetes mellitus, obesity, hypertension and sedentary lifestyle.  Hypertension This is a chronic problem. The current episode started more than 1 year ago. The problem has been gradually improving since onset. The problem is uncontrolled. Pertinent negatives include no blurred vision, chest pain, palpitations or shortness of breath. Past treatments include angiotensin blockers and diuretics. The current treatment provides mild improvement. Compliance problems include exercise.      Past Medical History:  Diagnosis Date  . Ascending aorta dilatation (HCC)   . Asthma   . Chronic diastolic CHF (congestive heart failure) (HCC)   . Chronic rhinitis   . Cough   . Dilation of pulmonary artery (HCC)   . Edema, lower extremity   . GERD (gastroesophageal reflux disease)   . HTN (hypertension)   . Mild aortic insufficiency   . Morbid obesity (HCC)   . Murmur   . Pneumonia   . Pre-diabetes      Family History  Problem Relation Age of Onset  . Hypertension Mother    . Diabetes Mother   . Heart disease Mother   . Sleep apnea Mother   . Obesity Mother   . Hypertension Father   . Anemia Father   . Breast cancer Neg Hx      Current Outpatient Medications:  .  albuterol  (VENTOLIN  HFA) 108 (90 Base) MCG/ACT inhaler, TAKE 2 PUFFS BY MOUTH EVERY 4 TO 6 HOURS AS NEEDED, Disp: 18 each, Rfl: 1 .  amLODipine  (NORVASC ) 10 MG tablet, Take 1 tablet (10 mg total) by mouth daily., Disp: 90 tablet, Rfl: 3 .  atorvastatin  (LIPITOR) 20 MG tablet, One tab po every day, Disp: 90 tablet, Rfl: 1 .  azelastine  (OPTIVAR ) 0.05 % ophthalmic solution, Place 1 drop into both eyes 2 (two) times daily as needed (itchy/watery eyes)., Disp: 6 mL, Rfl: 5 .  Azelastine -Fluticasone  137-50 MCG/ACT SUSP, PLACE 1 SPRAY INTO THE NOSE 2 (TWO) TIMES DAILY., Disp: 23 g, Rfl: 3 .  budesonide -glycopyrrolate-formoterol  (BREZTRI  AEROSPHERE) 160-9-4.8 MCG/ACT AERO inhaler, Inhale 2 puffs into the lungs in the morning and at bedtime., Disp: 10.7 g, Rfl: 5 .  EPINEPHrine  0.3 mg/0.3 mL IJ SOAJ injection, SMARTSIG:0.3 Milligram(s) IM Once PRN, Disp: 1 each, Rfl: 2 .  famotidine  (PEPCID ) 20 MG tablet, TAKE 1 TABLET BY MOUTH EVERYDAY AT BEDTIME, Disp: 90 tablet, Rfl: 0 .  gabapentin  (NEURONTIN ) 100 MG capsule, Take by mouth., Disp: , Rfl:  .  levocetirizine (XYZAL ) 5 MG tablet, Take 1 tablet (5 mg total) by mouth every evening., Disp: 90 tablet, Rfl: 1 .  montelukast  (  SINGULAIR ) 10 MG tablet, TAKE 1 TABLET BY MOUTH EVERYDAY AT BEDTIME, Disp: 90 tablet, Rfl: 1 .  NURTEC 75 MG TBDP, Take by mouth., Disp: , Rfl:  .  nystatin  (MYCOSTATIN /NYSTOP ) powder, Apply topically 2 (two) times daily., Disp: 30 g, Rfl: 1 .  nystatin  cream (MYCOSTATIN ), Apply 1 Application topically 2 (two) times daily., Disp: 30 g, Rfl: 2 .  olmesartan  (BENICAR ) 20 MG tablet, Take 20 mg by mouth daily., Disp: , Rfl:  .  Olopatadine  HCl 0.2 % SOLN, Apply 1 drop to eye daily., Disp: 2.5 mL, Rfl: 5 .  pantoprazole  (PROTONIX ) 40 MG  tablet, Take 1 tablet by mouth daily, Disp: 180 tablet, Rfl: 0 .  Semaglutide , 2 MG/DOSE, (OZEMPIC , 2 MG/DOSE,) 8 MG/3ML SOPN, INJECT 2MG  INTO THE SKIN ONCE WEEKLY, Disp: 3 mL, Rfl: 3 .  Sennosides 17.2 MG TABS, Take by mouth., Disp: , Rfl:  .  spironolactone  (ALDACTONE ) 50 MG tablet, TAKE 1 TABLET BY MOUTH EVERY DAY, Disp: 90 tablet, Rfl: 3 .  tiZANidine  (ZANAFLEX ) 4 MG tablet, TAKE 1 TABLET BY MOUTH EVERY DAY AS NEEDED, Disp: 90 tablet, Rfl: 1  Current Facility-Administered Medications:  .  Benralizumab  SOSY 30 mg, 30 mg, Subcutaneous, Q8 weeks, Jeneal Danita Macintosh, MD, 30 mg at 01/15/23 0959   Allergies  Allergen Reactions  . Honey Bee Treatment [Bee Venom]   . Wheat      Review of Systems  Eyes:  Negative for blurred vision.  Respiratory:  Negative for shortness of breath.   Cardiovascular:  Negative for chest pain and palpitations.  Endocrine: Negative for polydipsia, polyphagia and polyuria.  Neurological:  Negative for weakness.     There were no vitals filed for this visit. There is no height or weight on file to calculate BMI.  Wt Readings from Last 3 Encounters:  02/19/24 275 lb 3.2 oz (124.8 kg)  09/05/23 272 lb (123.4 kg)  09/02/23 269 lb (122 kg)    The 10-year ASCVD risk score (Arnett DK, et al., 2019) is: 26.8%   Values used to calculate the score:     Age: 50 years     Clincally relevant sex: Female     Is Non-Hispanic African American: Yes     Diabetic: Yes     Tobacco smoker: Yes     Systolic Blood Pressure: 138 mmHg     Is BP treated: Yes     HDL Cholesterol: 47 mg/dL     Total Cholesterol: 160 mg/dL  Objective:  Physical Exam      Assessment And Plan:  Type 2 diabetes mellitus with other specified complication, without long-term current use of insulin (HCC)  Hypertensive heart disease with chronic diastolic congestive heart failure (HCC)  Pure hypercholesterolemia    No follow-ups on file.  Patient was given opportunity to ask  questions. Patient verbalized understanding of the plan and was able to repeat key elements of the plan. All questions were answered to their satisfaction.    I, Catheryn LOISE Slocumb, MD, have reviewed all documentation for this visit. The documentation on 06/21/24 for the exam, diagnosis, procedures, and orders are all accurate and complete.   IF YOU HAVE BEEN REFERRED TO A SPECIALIST, IT MAY TAKE 1-2 WEEKS TO SCHEDULE/PROCESS THE REFERRAL. IF YOU HAVE NOT HEARD FROM US /SPECIALIST IN TWO WEEKS, PLEASE GIVE US  A CALL AT [phone].

## 2024-07-08 NOTE — Progress Notes (Signed)
 FOOT & ANKLE NEW PATIENT OFFICE VISIT  PATIENT: Jessica Snow MRN:  [id number] DOB:  1965/12/23 DATE OF SERVICE:  07/08/2024  Primary Physician:  Catheryn LOISE Slocumb, MD  Jessica Snow is a 58 y.o. female who presents today for evaluation of her left foot and ankle.  Patient underwent IM nailing of a distal third spiral oblique tibial shaft fracture about 9 years ago.  She has had on and off ankle pain since that time.  She reports ankle pain is ongoing.  She describes this is over the anterior medial ankle as well as the distal tibia as well as some medial and lateral midfoot pain.  Past Medical History:  Diagnosis Date  . Asthma (*)   . Diabetes mellitus, type 2 (*)   . Diastolic heart failure (*)   . Hypertension   . Obesity   . OSA on CPAP   . Stroke (*)    Past Surgical History:  Procedure Laterality Date  . Hysterectomy    . Pr anesthesia closed proc lower leg ankle & foot Left 06/07/2016   Dr. Georgina LEFT TIB/FIB  ROP  . Tonsillectomy     Bee venom, Other, Tree and shrub pollen, and Wheat No family history on file. Social History[1]  Review of Systems: See below for the review of systems which I have reviewed, edited and corrected.  A complete review of systems was performed and all pertinent points were found to be negative except as above in the HPI.   OBJECTIVE:   VITALS:  There were no vitals taken for this visit.  PHYSICAL EXAMINATION: GENERAL: Well-appearing, alert & oriented, no acute distress.  Appears stated age. HEAD: Normocephalic, atraumatic HEENT: Pupils equal, pupils round  CV: Palpable pulses, extremities warm, well-perfused RESP: Regular rate, nonlabored breathing NEURO: Sensation intact SKIN: No lesions, No rash PSYCH: Pleasant demeanor, normal affect LYMPH: No lymphadenopathy  Foot/Ankle Exam - Left: Hindfoot alignment: mild pes planovalgus  Skin lesions: None (Include callous/nails) Discoloration: None Capillary refill: Intact  all toes Effusion: mild   ROM:  Range of motion ankle:   Dorsiflexion = 5  Plantarflexion = 35   Normal ROM subtalar, Chopart, midfoot and forefoot joints  No pain with range of motion  Silfverskild test: negative Talar dome tenderness: positive Anteriorly Palpable hardware in the distal tibia that is tender to palpation Generalized tenderness with palpation throughout the midfoot  Stability: No instability of ATFL and CFL with stress testing  Strength:  Muscle Strength: Tib Ant, EHL, FHL, Gastroc/soleus, Peroneals, Posterior tib=5/5  Sensation intact to light touch L3-S1   IMAGING: XR Foot Left Ap Lateral And Oblique Standing Weightbearing imaging of the Left foot and ankle (AP/lateral/oblique)  taken today in clinic is independently reviewed and demonstrates: Mild pes  planovalgus alignment, no degenerative changes, no fracture, no acute  findings, hardware in the distal tibia visualized  ASSESSMENT:   1. Pain of foot, unspecified laterality   2. Impingement syndrome of left ankle     PLAN:  I discussed with the patient the diagnosis, the imaging findings, and various treatment options.  Patient with evidence of left ankle impingement many years following intramedullary nailing of a tibial fracture.  She also has prominent hardware that may be painful.  We discussed recommendations.  I did recommend an intra-articular steroid injection.  This was performed in clinic and tolerated well by the patient.  I will see her back in 4 weeks for repeat evaluation.  We could consider hardware removal with  ankle arthroscopy based on how she does.  - Follow-up: 4 week(s)   Risks, benefits, and alternatives of the medications and treatment plan prescribed today were discussed.  All questions were answered and the patient expressed understanding and agreement with the treatment plan.     This note was transcribed with voice recognition software.  Please excuse transcription  errors.  Author:  Murray KATHEE Primas, MD 07/08/2024 9:36 AM  CC: Catheryn LOISE Slocumb, MD  Medium joint injection/arthrocentesis: L ankle Date/Time: 07/08/2024 9:46 AM Consent given by: patient Timeout: Immediately prior to procedure a time out was called to verify the correct patient, procedure, equipment, support staff and site/side marked as required  Supporting Documentation Indications: pain and joint swelling  Procedure Details Location: ankle - L ankle Preparation: Patient was prepped and draped in the usual sterile fashion Needle size: 22 G Approach: anteromedial Medications administered: 4 mg dexamethasone sodium phosphate 4 mg/mL Patient tolerance: patient tolerated the procedure well with no immediate complications           [1] Social History Socioeconomic History  . Marital status: Single  Tobacco Use  . Smoking status: Former    Current packs/day: 0.00    Average packs/day: 0.5 packs/day for 30.2 years (15.1 ttl pk-yrs)    Types: Cigarettes    Start date: 61    Quit date: 01/26/2018    Years since quitting: 6.4  . Smokeless tobacco: Never  Substance and Sexual Activity  . Alcohol use: No  . Drug use: No  . Sexual activity: Never

## 2024-07-27 ENCOUNTER — Other Ambulatory Visit: Payer: Self-pay | Admitting: Internal Medicine

## 2024-07-27 DIAGNOSIS — Z1231 Encounter for screening mammogram for malignant neoplasm of breast: Secondary | ICD-10-CM

## 2024-07-28 ENCOUNTER — Other Ambulatory Visit: Payer: Self-pay

## 2024-07-28 ENCOUNTER — Ambulatory Visit (INDEPENDENT_AMBULATORY_CARE_PROVIDER_SITE_OTHER): Admitting: Allergy

## 2024-07-28 ENCOUNTER — Encounter: Payer: Self-pay | Admitting: Allergy

## 2024-07-28 VITALS — BP 122/80 | HR 70 | Temp 98.0°F | Resp 20 | Ht 62.5 in | Wt 263.6 lb

## 2024-07-28 DIAGNOSIS — J3089 Other allergic rhinitis: Secondary | ICD-10-CM | POA: Diagnosis not present

## 2024-07-28 DIAGNOSIS — T7800XD Anaphylactic reaction due to unspecified food, subsequent encounter: Secondary | ICD-10-CM | POA: Diagnosis not present

## 2024-07-28 DIAGNOSIS — T7800XA Anaphylactic reaction due to unspecified food, initial encounter: Secondary | ICD-10-CM

## 2024-07-28 DIAGNOSIS — H1013 Acute atopic conjunctivitis, bilateral: Secondary | ICD-10-CM

## 2024-07-28 DIAGNOSIS — J4550 Severe persistent asthma, uncomplicated: Secondary | ICD-10-CM

## 2024-07-28 MED ORDER — BREZTRI AEROSPHERE 160-9-4.8 MCG/ACT IN AERO: 2.0000 | INHALATION_SPRAY | Freq: Two times a day (BID) | RESPIRATORY_TRACT | 5 refills | Status: AC

## 2024-07-28 MED ORDER — BENRALIZUMAB 30 MG/ML ~~LOC~~ SOSY
30.0000 mg | PREFILLED_SYRINGE | SUBCUTANEOUS | Status: AC
  Administered 2024-07-28 – 2024-11-17 (×4): 30 mg via SUBCUTANEOUS

## 2024-07-28 MED ORDER — EPINEPHRINE 0.3 MG/0.3ML IJ SOAJ: INTRAMUSCULAR | 2 refills | Status: AC

## 2024-07-28 MED ORDER — MONTELUKAST SODIUM 10 MG PO TABS: ORAL_TABLET | ORAL | 1 refills | Status: AC

## 2024-07-28 MED ORDER — LEVOCETIRIZINE DIHYDROCHLORIDE 5 MG PO TABS: 5.0000 mg | ORAL_TABLET | Freq: Every evening | ORAL | 1 refills | Status: AC

## 2024-07-28 MED ORDER — OLOPATADINE HCL 0.2 % OP SOLN: 1.0000 [drp] | Freq: Every day | OPHTHALMIC | 5 refills | Status: AC

## 2024-07-28 NOTE — Progress Notes (Signed)
 Immunotherapy   Patient Details  Name: Jessica Snow MRN: [id number] Date of Birth: 1965-11-16  07/28/2024  Jessica Snow started injections for  Fasenra   Frequency: Every 28 days x 3 then every 8 weeks. Consent signed and patient instructions given. Patient waited in office for 15 minutes with no issues.    Isaiah LITTIE Shed 07/28/2024, 12:12 PM

## 2024-07-28 NOTE — Patient Instructions (Addendum)
 Asthma -   - starting Fasenra  injections today for improved asthma control.  Next dose in 4 weeks (first week of November).  Third dose will be the first week of December and then doses after that will be every other month  - continue Breztri  2 puffs twice a day and use with spacer device.    - have access to albuterol  inhaler 2 puffs every 4-6 hours as needed for cough/wheeze/shortness of breath/chest tightness.  May use 15-20 minutes prior to activity.   Monitor frequency of use.    - continue Singulair  10mg  daily at bedtime.  Asthma control goals:  Full participation in all desired activities (may need albuterol  before activity) Albuterol  use two time or less a week on average (not counting use with activity) Cough interfering with sleep two time or less a month Oral steroids no more than once a year No hospitalizations  Allergies   - continue allergen avoidance measures for tree pollen and mold  - continue Xyzal  5mg  daily 1-2 tabs  - continue singulair  as above  - continue nasal saline spray to help keep nose moisturized  - use 3 times a week (Mon, Wed, Fri) Flonase + Astelin  (nasal antihistamine) 1 spray each nostril twice a day.  This helps with both nasal congestion and drainage.   - use Optivar   1 drop each eye twice a day as needed for itchy/watery eyes  - recommend after getting in from being outside to wash/wipe of face and eyes  Food allergy  vs intolerance  - continue avoidance of honey and wheat in the diet  - have access to self-injectable epinephrine  (Epipen  or AuviQ) 0.3mg  at all times  - follow emergency action plan in case of allergic reaction  Follow-up in 4-6 months or sooner if needed

## 2024-07-28 NOTE — Progress Notes (Signed)
 Follow-up Note  RE: Jessica Snow MRN: [id number] DOB: [birth year] Date of Office Visit: 07/28/2024   History of present illness: Jessica Snow is a 58 y.o. female presenting today for follow-up of asthma, allergic rhinitis with conjunctivitis and Epesri reaction.  She was last seen in the office on 03/24/2024 by myself. Discussed the use of AI scribe software for clinical note transcription with the patient, who gave verbal consent to proceed.  She is starting Fasenra  injections today. She experiences significant asthma symptoms, including difficulty talking and a sensation of choking mostly due to phlegm. She experiences shortness of breath and fatigue, particularly when exerting herself at work, sometimes needing to sit down to catch her breath. Her asthma flare-ups are frequently exacerbated by exposure to allergens. Her current medications include a Breztri  inhaler, which she plans to refill soon, Singulair  nightly and Xyzal , which she takes at night. She uses a nasal spray three times a week and AYR twice a week for nasal symptoms. She also uses eye drops occasionally for irritation, especially during the first weeks of school in September when she needed to use them consistently.  She had a recent allergic reaction to honey caused throat and ear itching, but she did not need to use her EpiPen .  She got a fruit bowl from [employer] smooth[employer] and she told them not using honey and it was noted on her receipt however upon questioning what she could have ate that caused this reaction a family member noted that the pool did have honey on it.  Review of systems: 10pt ROS negative unless noted above in HPI  Past medical/social/surgical/family history have been reviewed and are unchanged unless specifically indicated below.  No changes  Medication List: Current Outpatient Medications  Medication Sig Dispense Refill   albuterol  (VENTOLIN  HFA) 108 (90 Base) MCG/ACT inhaler TAKE 2 PUFFS BY  MOUTH EVERY 4 TO 6 HOURS AS NEEDED 18 each 1   amLODipine  (NORVASC ) 10 MG tablet Take 1 tablet (10 mg total) by mouth daily. 90 tablet 3   atorvastatin  (LIPITOR) 20 MG tablet One tab po every day 90 tablet 1   azelastine  (OPTIVAR ) 0.05 % ophthalmic solution Place 1 drop into both eyes 2 (two) times daily as needed (itchy/watery eyes). 6 mL 5   Azelastine -Fluticasone  137-50 MCG/ACT SUSP PLACE 1 SPRAY INTO THE NOSE 2 (TWO) TIMES DAILY. 23 g 3   budesonide -glycopyrrolate-formoterol  (BREZTRI  AEROSPHERE) 160-9-4.8 MCG/ACT AERO inhaler Inhale 2 puffs into the lungs in the morning and at bedtime. 10.7 g 5   EPINEPHrine  0.3 mg/0.3 mL IJ SOAJ injection SMARTSIG:0.3 Milligram(s) IM Once PRN 1 each 2   famotidine  (PEPCID ) 20 MG tablet TAKE 1 TABLET BY MOUTH EVERYDAY AT BEDTIME 90 tablet 0   gabapentin  (NEURONTIN ) 100 MG capsule Take by mouth.     levocetirizine (XYZAL ) 5 MG tablet Take 1 tablet (5 mg total) by mouth every evening. 90 tablet 1   montelukast  (SINGULAIR ) 10 MG tablet TAKE 1 TABLET BY MOUTH EVERYDAY AT BEDTIME 90 tablet 1   NURTEC 75 MG TBDP Take by mouth.     nystatin  (MYCOSTATIN /NYSTOP ) powder Apply topically 2 (two) times daily. 30 g 1   nystatin  cream (MYCOSTATIN ) Apply 1 Application topically 2 (two) times daily. 30 g 2   olmesartan  (BENICAR ) 20 MG tablet Take 20 mg by mouth daily.     Olopatadine  HCl 0.2 % SOLN Apply 1 drop to eye daily. 2.5 mL 5   pantoprazole  (PROTONIX ) 40 MG tablet Take 1  tablet by mouth daily 180 tablet 0   Semaglutide , 2 MG/DOSE, (OZEMPIC , 2 MG/DOSE,) 8 MG/3ML SOPN INJECT 2MG  INTO THE SKIN ONCE WEEKLY 3 mL 3   Sennosides 17.2 MG TABS Take by mouth.     spironolactone  (ALDACTONE ) 50 MG tablet TAKE 1 TABLET BY MOUTH EVERY DAY 90 tablet 3   tiZANidine  (ZANAFLEX ) 4 MG tablet TAKE 1 TABLET BY MOUTH EVERY DAY AS NEEDED 90 tablet 1   Current Facility-Administered Medications  Medication Dose Route Frequency Provider Last Rate Last Admin   benralizumab  (FASENRA )  prefilled syringe 30 mg  30 mg Subcutaneous Q28 days Jeneal Danita Macintosh, MD   30 mg at 07/28/24 1157   Benralizumab  SOSY 30 mg  30 mg Subcutaneous Q8 weeks Jeneal Danita Macintosh, MD   30 mg at 01/15/23 9040     Known medication allergies: Allergies  Allergen Reactions   Honey Bee Treatment [Bee Venom]    Wheat      Physical examination: Blood pressure 122/80, pulse 70, temperature 98 F (36.7 C), resp. rate 20, height 5' 2.5 (1.588 m), weight 263 lb 9.6 oz (119.6 kg), last menstrual period 12/19/2005, SpO2 97%.  General: Alert, interactive, in no acute distress. HEENT: PERRLA, TMs pearly gray, turbinates mildly edematous without discharge, post-pharynx non erythematous. Neck: Supple without lymphadenopathy. Lungs: Mildly decreased breath sounds bilaterally without wheezing, rhonchi or rales. {no increased work of breathing. CV: Normal S1, S2 without murmurs. Abdomen: Nondistended, nontender. Skin: Warm and dry, without lesions or rashes. Extremities:  No clubbing, cyanosis or edema. Neuro:   Grossly intact.  Diagnostics/Labs:  Spirometry: FEV1: 1.19L 55%, FVC: 1.39L 51% predicted.  This is quite stable for her   Assessment and plan: Asthma -   - starting Fasenra  injections today for improved asthma control.  Next dose in 4 weeks (first week of November).  Third dose will be the first week of December and then doses after that will be every other month  - continue Breztri  2 puffs twice a day and use with spacer device.    - have access to albuterol  inhaler 2 puffs every 4-6 hours as needed for cough/wheeze/shortness of breath/chest tightness.  May use 15-20 minutes prior to activity.   Monitor frequency of use.    - continue Singulair  10mg  daily at bedtime.  Asthma control goals:  Full participation in all desired activities (may need albuterol  before activity) Albuterol  use two time or less a week on average (not counting use with activity) Cough interfering with  sleep two time or less a month Oral steroids no more than once a year No hospitalizations  Allergi rhinitis with conjunctivitisc   - continue allergen avoidance measures for tree pollen and mold  - continue Xyzal  5mg  daily 1-2 tabs  - continue singulair  as above  - continue nasal saline spray to help keep nose moisturized  - use 3 times a week (Mon, Wed, Fri) Flonase + Astelin  (nasal antihistamine) 1 spray each nostril twice a day.  This helps with both nasal congestion and drainage.   - use Optivar   1 drop each eye twice a day as needed for itchy/watery eyes  - recommend after getting in from being outside to wash/wipe of face and eyes  Food allergy  vs intolerance  - continue avoidance of honey and wheat in the diet  - have access to self-injectable epinephrine  (Epipen  or AuviQ) 0.3mg  at all times  - follow emergency action plan in case of allergic reaction  Follow-up in 4-6 months or sooner  if needed  I appreciate the opportunity to take part in Takyia's care. Please do not hesitate to contact me with questions.  Sincerely,   Danita Brain, MD Allergy /Immunology [facility]

## 2024-08-06 ENCOUNTER — Ambulatory Visit
Admission: RE | Admit: 2024-08-06 | Discharge: 2024-08-06 | Disposition: A | Discharge status: Home / Self Care | Payer: BC Managed Care – PPO | Source: Ambulatory Visit | Attending: Internal Medicine | Admitting: Internal Medicine

## 2024-08-06 DIAGNOSIS — Z1231 Encounter for screening mammogram for malignant neoplasm of breast: Secondary | ICD-10-CM

## 2024-08-11 ENCOUNTER — Other Ambulatory Visit: Payer: Self-pay | Admitting: Internal Medicine

## 2024-08-11 DIAGNOSIS — R928 Other abnormal and inconclusive findings on diagnostic imaging of breast: Secondary | ICD-10-CM

## 2024-08-19 ENCOUNTER — Ambulatory Visit
Admission: RE | Admit: 2024-08-19 | Discharge: 2024-08-19 | Disposition: A | Source: Ambulatory Visit | Attending: Internal Medicine | Admitting: Internal Medicine

## 2024-08-19 ENCOUNTER — Other Ambulatory Visit: Payer: Self-pay | Admitting: Internal Medicine

## 2024-08-19 ENCOUNTER — Ambulatory Visit
Admission: RE | Admit: 2024-08-19 | Discharge: 2024-08-19 | Disposition: A | Discharge status: Home / Self Care | Source: Ambulatory Visit | Attending: Internal Medicine | Admitting: Internal Medicine

## 2024-08-19 DIAGNOSIS — N6489 Other specified disorders of breast: Secondary | ICD-10-CM

## 2024-08-19 DIAGNOSIS — R928 Other abnormal and inconclusive findings on diagnostic imaging of breast: Secondary | ICD-10-CM

## 2024-08-24 ENCOUNTER — Ambulatory Visit: Admitting: Internal Medicine

## 2024-08-24 ENCOUNTER — Encounter: Payer: Self-pay | Admitting: Internal Medicine

## 2024-08-24 VITALS — BP 110/60 | HR 88 | Temp 98.4°F | Ht 62.5 in | Wt 269.0 lb

## 2024-08-24 DIAGNOSIS — E7800 Pure hypercholesterolemia, unspecified: Secondary | ICD-10-CM

## 2024-08-24 DIAGNOSIS — E1169 Type 2 diabetes mellitus with other specified complication: Secondary | ICD-10-CM | POA: Diagnosis not present

## 2024-08-24 DIAGNOSIS — I110 Hypertensive heart disease with heart failure: Secondary | ICD-10-CM | POA: Diagnosis not present

## 2024-08-24 DIAGNOSIS — E66813 Obesity, class 3: Secondary | ICD-10-CM

## 2024-08-24 DIAGNOSIS — Z6841 Body Mass Index (BMI) 40.0 and over, adult: Secondary | ICD-10-CM

## 2024-08-24 DIAGNOSIS — Z23 Encounter for immunization: Secondary | ICD-10-CM | POA: Diagnosis not present

## 2024-08-24 DIAGNOSIS — J45991 Cough variant asthma: Secondary | ICD-10-CM

## 2024-08-24 DIAGNOSIS — I5032 Chronic diastolic (congestive) heart failure: Secondary | ICD-10-CM | POA: Diagnosis not present

## 2024-08-24 MED ORDER — SEMAGLUTIDE (1 MG/DOSE) 4 MG/3ML ~~LOC~~ SOPN: 1.0000 mg | PEN_INJECTOR | SUBCUTANEOUS | 0 refills | Status: DC

## 2024-08-24 NOTE — Progress Notes (Signed)
 I,Jameka J Llittleton, CMA,acting as a neurosurgeon for Jessica LOISE Slocumb, MD.,have documented all relevant documentation on the behalf of Jessica LOISE Slocumb, MD,as directed by  Jessica LOISE Slocumb, MD while in the presence of Jessica LOISE Slocumb, MD.  Subjective:  Patient ID: Jessica Snow , female    DOB: 1965/12/01 , 58 y.o.   MRN: [id number]  Chief Complaint  Patient presents with   Hypertension    Patient presents today for a bpc. Patient reports compliance with her meds. Patient denies having chest pain,sob or headaches at this time. Patient doesn't have any questions or concerns at this time.    Diabetes    HPI Discussed the use of AI scribe software for clinical note transcription with the patient, who gave verbal consent to proceed.  History of Present Illness Jessica Snow is a 58 year old female with hypertension and diabetes who presents for a blood pressure and diabetes check.  She manages her hypertension with amlodipine  10 mg daily and her cholesterol with atorvastatin  20 mg daily. She recently resumed using her Breztri  inhaler for asthma after a lapse due to insurance issues, noting slight improvement in her breathing. She is also on spironolactone  and Protonix  40 mg for heartburn.  She recently had a mammogram that showed abnormalities, and she is scheduled for a biopsy tomorrow. There is a family history of breast cancer on her mother's side, as her aunt had breast cancer.  She has been using Ozempic  2 mg once a week for diabetes but has not filled it since June due to financial constraints. She injects on Wednesdays.  She was previously on gabapentin  but discontinued it last week. She continues to take Elavil for vertigo, prescribed by her neurology doctor. She also uses Xyzal  and Singulair  for allergies and Nurtec for migraines.  She describes a stressful period, including her mother's recent hospitalization for low blood sugar and a heart attack, which required transfer to a different  hospital for treatment of a blockage. She also mentions upcoming ankle surgery in December.   Hypertension This is a chronic problem. The current episode started more than 1 year ago. The problem has been gradually improving since onset. The problem is uncontrolled. Pertinent negatives include no blurred vision, chest pain, palpitations or shortness of breath. Past treatments include angiotensin blockers and diuretics. The current treatment provides mild improvement. Compliance problems include exercise.   Diabetes She presents for her follow-up diabetic visit. She has type 2 diabetes mellitus. There are no diabetic associated symptoms. Pertinent negatives for diabetes include no blurred vision, no chest pain, no polydipsia, no polyphagia, no polyuria and no weakness. There are no hypoglycemic complications. Risk factors for coronary artery disease include diabetes mellitus, obesity, hypertension and sedentary lifestyle.     Past Medical History:  Diagnosis Date   Ascending aorta dilatation    Asthma    Chronic diastolic CHF (congestive heart failure) (HCC)    Chronic rhinitis    Cough    Dilation of pulmonary artery (HCC)    Edema, lower extremity    GERD (gastroesophageal reflux disease)    HTN (hypertension)    Mild aortic insufficiency    Morbid obesity (HCC)    Murmur    Pneumonia    Pre-diabetes      Family History  Problem Relation Age of Onset   Hypertension Mother    Diabetes Mother    Heart disease Mother    Sleep apnea Mother    Obesity Mother  Hypertension Father    Anemia Father    Breast cancer Neg Hx      Current Outpatient Medications:    albuterol  (VENTOLIN  HFA) 108 (90 Base) MCG/ACT inhaler, TAKE 2 PUFFS BY MOUTH EVERY 4 TO 6 HOURS AS NEEDED, Disp: 18 each, Rfl: 1   amitriptyline (ELAVIL) 10 MG tablet, Take 10 mg by mouth at bedtime., Disp: , Rfl:    amLODipine  (NORVASC ) 10 MG tablet, Take 1 tablet (10 mg total) by mouth daily., Disp: 90 tablet, Rfl: 3    atorvastatin  (LIPITOR) 20 MG tablet, One tab po every day, Disp: 90 tablet, Rfl: 1   azelastine  (OPTIVAR ) 0.05 % ophthalmic solution, Place 1 drop into both eyes 2 (two) times daily as needed (itchy/watery eyes)., Disp: 6 mL, Rfl: 5   Azelastine -Fluticasone  137-50 MCG/ACT SUSP, PLACE 1 SPRAY INTO THE NOSE 2 (TWO) TIMES DAILY., Disp: 23 g, Rfl: 3   budesonide -glycopyrrolate-formoterol  (BREZTRI  AEROSPHERE) 160-9-4.8 MCG/ACT AERO inhaler, Inhale 2 puffs into the lungs in the morning and at bedtime., Disp: 10.7 g, Rfl: 5   EPINEPHrine  0.3 mg/0.3 mL IJ SOAJ injection, SMARTSIG:0.3 Milligram(s) IM Once PRN, Disp: 1 each, Rfl: 2   levocetirizine (XYZAL ) 5 MG tablet, Take 1 tablet (5 mg total) by mouth every evening., Disp: 90 tablet, Rfl: 1   montelukast  (SINGULAIR ) 10 MG tablet, TAKE 1 TABLET BY MOUTH EVERYDAY AT BEDTIME, Disp: 90 tablet, Rfl: 1   NURTEC 75 MG TBDP, Take by mouth., Disp: , Rfl:    nystatin  cream (MYCOSTATIN ), Apply 1 Application topically 2 (two) times daily., Disp: 30 g, Rfl: 2   Olopatadine  HCl 0.2 % SOLN, Apply 1 drop to eye daily., Disp: 2.5 mL, Rfl: 5   pantoprazole  (PROTONIX ) 40 MG tablet, Take 1 tablet by mouth daily, Disp: 180 tablet, Rfl: 0   Semaglutide , 1 MG/DOSE, 4 MG/3ML SOPN, Inject 1 mg into the skin once a week., Disp: 3 mL, Rfl: 0   spironolactone  (ALDACTONE ) 50 MG tablet, TAKE 1 TABLET BY MOUTH EVERY DAY, Disp: 90 tablet, Rfl: 3   tiZANidine  (ZANAFLEX ) 4 MG tablet, TAKE 1 TABLET BY MOUTH EVERY DAY AS NEEDED, Disp: 90 tablet, Rfl: 1   famotidine  (PEPCID ) 20 MG tablet, TAKE 1 TABLET BY MOUTH EVERYDAY AT BEDTIME (Patient not taking: Reported on 08/24/2024), Disp: 90 tablet, Rfl: 0   nystatin  (MYCOSTATIN /NYSTOP ) powder, Apply topically 2 (two) times daily. (Patient not taking: Reported on 08/24/2024), Disp: 30 g, Rfl: 1  Current Facility-Administered Medications:    benralizumab  (FASENRA ) prefilled syringe 30 mg, 30 mg, Subcutaneous, Q28 days, Jeneal Danita Macintosh,  MD, 30 mg at 08/25/24 9083   Benralizumab  SOSY 30 mg, 30 mg, Subcutaneous, Q8 weeks, Jeneal Danita Macintosh, MD, 30 mg at 01/15/23 9040   Allergies  Allergen Reactions   Honey Bee Treatment [Bee Venom]    Wheat      Review of Systems  Constitutional: Negative.   Eyes:  Negative for blurred vision.  Respiratory: Negative.  Negative for shortness of breath.   Cardiovascular: Negative.  Negative for chest pain and palpitations.  Gastrointestinal: Negative.   Endocrine: Negative for polydipsia, polyphagia and polyuria.  Neurological: Negative.  Negative for weakness.  Psychiatric/Behavioral: Negative.       Today's Vitals   08/24/24 1458  BP: 110/60  Pulse: 88  Temp: 98.4 F (36.9 C)  TempSrc: Oral  Weight: 269 lb (122 kg)  Height: 5' 2.5 (1.588 m)  PainSc: 0-No pain   Body mass index is 48.42 kg/m.  Wt Readings from  Last 3 Encounters:  08/24/24 269 lb (122 kg)  07/28/24 263 lb 9.6 oz (119.6 kg)  02/19/24 275 lb 3.2 oz (124.8 kg)    The 10-year ASCVD risk score (Arnett DK, et al., 2019) is: 12.8%   Values used to calculate the score:     Age: 35 years     Clincally relevant sex: Female     Is Non-Hispanic African American: Yes     Diabetic: Yes     Tobacco smoker: Yes     Systolic Blood Pressure: 110 mmHg     Is BP treated: Yes     HDL Cholesterol: 46 mg/dL     Total Cholesterol: 148 mg/dL  Objective:  Physical Exam Vitals and nursing note reviewed.  Constitutional:      Appearance: Normal appearance. She is obese.  HENT:     Head: Normocephalic and atraumatic.  Eyes:     Extraocular Movements: Extraocular movements intact.  Cardiovascular:     Rate and Rhythm: Normal rate and regular rhythm.     Heart sounds: Normal heart sounds.  Pulmonary:     Effort: Pulmonary effort is normal.     Breath sounds: Normal breath sounds.  Musculoskeletal:     Cervical back: Normal range of motion.  Skin:    General: Skin is warm.  Neurological:     General: No  focal deficit present.     Mental Status: She is alert.  Psychiatric:        Mood and Affect: Mood normal.        Behavior: Behavior normal.         Assessment And Plan:  Hypertensive heart disease with chronic diastolic congestive heart failure (HCC) Assessment & Plan: Chronic, well controlled.  Emphasized lifestyle and medication adherence for control. - Encourage regular exercise. - Advise on dietary modifications to reduce sodium intake. - Continue current antihypertensive medications. - Continue with amlodipine  10mg  daily, spironolactone  50mg  daily and olmesartan  20mg  - Follow low sodium diet.   Orders: -     CMP14+EGFR -     Lipid panel  Type 2 diabetes mellitus with other specified complication, without long-term current use of insulin (HCC) Assessment & Plan: Diabetes management hindered by financial constraints affecting Ozempic  adherence. - Restart Ozempic  at 0.5 mg for two weeks, then increase to 1 mg for one month before returning to 2 mg. - Send prescription for 1 mg Ozempic  to pharmacy. - Advise her to communicate when ready to increase dose to 2 mg. - Discuss financial assistance options for medication, including requesting samples.  Orders: -     CMP14+EGFR -     Hemoglobin A1c -     Lipid panel  Pure hypercholesterolemia Assessment & Plan: Chronic, LDL goal < 70.  She will continue with atorvastatin  20mg  daily. She is encouraged to follow a heart healthy diet.   Orders: -     CMP14+EGFR -     Lipid panel  Cough variant asthma vs uacs  Assessment & Plan: Asthma management affected by insurance issues, slight improvement with Breztri . - Ensure regular use of Breztri  for asthma management. - Advise her to request medication samples if needed.   Class 3 severe obesity due to excess calories with serious comorbidity and body mass index (BMI) of 45.0 to 49.9 in adult [facility]) Assessment & Plan: Chronic, BMI 48.  She has lost 6 lbs since April 2025.   She  is encouraged to gradually increase her daily activity, aiming for at least  150 minutes per week.    Need for influenza vaccination -     Flu vaccine trivalent PF, 6mos and older(Flulaval,Afluria,Fluarix,Fluzone)  Other orders -     Semaglutide  (1 MG/DOSE); Inject 1 mg into the skin once a week.  Dispense: 3 mL; Refill: 0  General Health Maintenance Scheduled breast biopsy due to abnormal mammogram and family history of breast cancer. - Proceed with scheduled breast biopsy. No follow-ups on file.  Patient was given opportunity to ask questions. Patient verbalized understanding of the plan and was able to repeat key elements of the plan. All questions were answered to their satisfaction.   I, Jessica LOISE Slocumb, MD, have reviewed all documentation for this visit. The documentation on 08/24/24 for the exam, diagnosis, procedures, and orders are all accurate and complete.  IF YOU HAVE BEEN REFERRED TO A SPECIALIST, IT MAY TAKE 1-2 WEEKS TO SCHEDULE/PROCESS THE REFERRAL. IF YOU HAVE NOT HEARD FROM US /SPECIALIST IN TWO WEEKS, PLEASE GIVE US  A CALL AT [phone].

## 2024-08-24 NOTE — Patient Instructions (Signed)

## 2024-08-25 ENCOUNTER — Ambulatory Visit
Admission: RE | Admit: 2024-08-25 | Discharge: 2024-08-25 | Disposition: A | Source: Ambulatory Visit | Attending: Internal Medicine | Admitting: Internal Medicine

## 2024-08-25 ENCOUNTER — Ambulatory Visit: Payer: Self-pay | Admitting: Internal Medicine

## 2024-08-25 ENCOUNTER — Ambulatory Visit
Admission: RE | Admit: 2024-08-25 | Discharge: 2024-08-25 | Disposition: A | Discharge status: Home / Self Care | Source: Ambulatory Visit | Attending: Internal Medicine | Admitting: Internal Medicine

## 2024-08-25 ENCOUNTER — Ambulatory Visit

## 2024-08-25 DIAGNOSIS — J4550 Severe persistent asthma, uncomplicated: Secondary | ICD-10-CM

## 2024-08-25 DIAGNOSIS — N6489 Other specified disorders of breast: Secondary | ICD-10-CM

## 2024-08-25 HISTORY — PX: BREAST BIOPSY: SHX20

## 2024-08-25 LAB — CMP14+EGFR
ALT: 18 IU/L (ref 0–32)
AST: 14 IU/L (ref 0–40)
Albumin: 3.9 g/dL (ref 3.8–4.9)
Alkaline Phosphatase: 99 IU/L (ref 49–135)
BUN/Creatinine Ratio: 14 (ref 9–23)
BUN: 11 mg/dL (ref 6–24)
Bilirubin Total: 0.2 mg/dL (ref 0.0–1.2)
CO2: 24 mmol/L (ref 20–29)
Calcium: 9.5 mg/dL (ref 8.7–10.2)
Chloride: 104 mmol/L (ref 96–106)
Creatinine, Ser: 0.76 mg/dL (ref 0.57–1.00)
Globulin, Total: 2.9 g/dL (ref 1.5–4.5)
Glucose: 157 mg/dL — ABNORMAL HIGH (ref 70–99)
Potassium: 4.7 mmol/L (ref 3.5–5.2)
Sodium: 141 mmol/L (ref 134–144)
Total Protein: 6.8 g/dL (ref 6.0–8.5)
eGFR: 92 mL/min/1.73 (ref >59)

## 2024-08-25 LAB — LIPID PANEL
Chol/HDL Ratio: 3.2 ratio (ref 0.0–4.4)
Cholesterol, Total: 148 mg/dL (ref 100–199)
HDL: 46 mg/dL (ref >39)
LDL Chol Calc (NIH): 79 mg/dL (ref 0–99)
Triglycerides: 129 mg/dL (ref 0–149)
VLDL Cholesterol Cal: 23 mg/dL (ref 5–40)

## 2024-08-25 LAB — HEMOGLOBIN A1C
Est. average glucose Bld gHb Est-mCnc: 148 mg/dL
Hgb A1c MFr Bld: 6.8 % — ABNORMAL HIGH (ref 4.8–5.6)

## 2024-08-26 LAB — SURGICAL PATHOLOGY

## 2024-08-29 NOTE — Assessment & Plan Note (Signed)
Chronic, LDL goal < 70.  She will continue with atorvastatin 20mg  daily. She is encouraged to follow a heart healthy diet.

## 2024-08-29 NOTE — Assessment & Plan Note (Signed)
 Chronic, well controlled.  Emphasized lifestyle and medication adherence for control. - Encourage regular exercise. - Advise on dietary modifications to reduce sodium intake. - Continue current antihypertensive medications. - Continue with amlodipine  10mg  daily, spironolactone  50mg  daily and olmesartan  20mg  - Follow low sodium diet.

## 2024-08-29 NOTE — Assessment & Plan Note (Signed)
 Diabetes management hindered by financial constraints affecting Ozempic  adherence. - Restart Ozempic  at 0.5 mg for two weeks, then increase to 1 mg for one month before returning to 2 mg. - Send prescription for 1 mg Ozempic  to pharmacy. - Advise her to communicate when ready to increase dose to 2 mg. - Discuss financial assistance options for medication, including requesting samples.

## 2024-08-29 NOTE — Assessment & Plan Note (Signed)
 Chronic, BMI 48.  She has lost 6 lbs since April 2025.   She is encouraged to gradually increase her daily activity, aiming for at least 150 minutes per week.

## 2024-08-29 NOTE — Assessment & Plan Note (Signed)
 Asthma management affected by insurance issues, slight improvement with Breztri . - Ensure regular use of Breztri  for asthma management. - Advise her to request medication samples if needed.

## 2024-09-13 ENCOUNTER — Other Ambulatory Visit: Payer: Self-pay | Admitting: *Deleted

## 2024-09-13 MED ORDER — FASENRA 30 MG/ML ~~LOC~~ SOSY: 30.0000 mg | PREFILLED_SYRINGE | SUBCUTANEOUS | 6 refills | Status: AC

## 2024-09-14 NOTE — Therapy (Signed)
 OUTPATIENT PHYSICAL THERAPY THORACOLUMBAR EVALUATION   Patient Name: Jessica Snow MRN: [id number] DOB:06/11/1966, 58 y.o., female Today's Date: 09/15/2024  END OF SESSION:  PT End of Session - 09/15/24 1019     Visit Number 1    Number of Visits 9    Date for Recertification  10/13/24    Authorization Type aetna    PT Start Time 1019   late check in   PT Stop Time 1101    PT Time Calculation (min) 42 min          Past Medical History:  Diagnosis Date   Ascending aorta dilatation    Asthma    Chronic diastolic CHF (congestive heart failure) (HCC)    Chronic rhinitis    Cough    Dilation of pulmonary artery (HCC)    Edema, lower extremity    GERD (gastroesophageal reflux disease)    HTN (hypertension)    Mild aortic insufficiency    Morbid obesity (HCC)    Murmur    Pneumonia    Pre-diabetes    Past Surgical History:  Procedure Laterality Date   ANEURYSM COILING  01/24/2023   ANKLE RECONSTRUCTION     BREAST BIOPSY Right 08/25/2024   MM RT BREAST BX W LOC DEV 1ST LESION IMAGE BX SPEC STEREO GUIDE 08/25/2024 [facility]   TONSILLECTOMY     TOTAL VAGINAL HYSTERECTOMY  2007   Patient Active Problem List   Diagnosis Date Noted   Bee allergy  status 02/23/2024   Chronic nonintractable headache 02/23/2024   Encounter for annual health examination 02/19/2024   Tension headache 06/07/2023   Other sequelae following nontraumatic subarachnoid hemorrhage 02/19/2023   Pneumonia, organism unspecified(486) 02/19/2023   Severe persistent asthma (HCC) 02/19/2023   OSA (obstructive sleep apnea) 02/19/2023   Dyslipidemia 02/19/2023   Pneumonia of both lower lobes due to infectious organism 01/30/2023   SAH (subarachnoid hemorrhage) (HCC) 01/23/2023   Tinea pedis of both feet 06/23/2022   Hypertensive heart disease with chronic diastolic congestive heart failure (HCC) 06/17/2022   Pure hypercholesterolemia 06/17/2022   Dilation of pulmonary artery (HCC) 08/14/2021    Type 2 diabetes mellitus with other specified complication, without long-term current use of insulin (HCC) 02/01/2020   Snoring 09/29/2019   Diastolic heart failure (HCC) 08/31/2018   Chronic rhinitis 07/16/2018   Gastroesophageal reflux disease 04/10/2018   Cough variant asthma vs uacs  04/02/2018   Left tibial fracture 06/07/2016   Post-tonsillectomy hemorrhage 04/26/2016   Class 3 severe obesity due to excess calories with serious comorbidity and body mass index (BMI) of 45.0 to 49.9 in adult [facility]) 01/20/2012   Murmur 01/20/2012   Essential hypertension     PCP: Jarold Medici, MD  REFERRING PROVIDER: Curtis Debby PARAS, MD  REFERRING DIAG: M54.50 (ICD-10-CM) - Low back pain  Rationale for Evaluation and Treatment: Rehabilitation  THERAPY DIAG:  Other low back pain  Pain of both hip joints  Muscle weakness (generalized)  ONSET DATE: March 2025  SUBJECTIVE:  SUBJECTIVE STATEMENT: States this episode began during a prolonged hospitalization in March, reduced mobility. She does report some off and on pain with work activities previously. States she got XR of low back, states she was told she has some arthritis in her hip and spine. Reports symptoms were irritated with a near fall last week. Reports ~10 min standing tolerance, 30 min sitting tolerance. Walking limited by exertional SOB but back will bother her as well. No bowel/bladder changes, no N/T, no saddle anesthesia. No fevers/chills.  PERTINENT HISTORY:  asthma, CHF, GERD, HTN, aortic insufficiency, headaches, hx SAH, OSA Pt reports she will still get R sided headaches/tension, stable  Denies chest pain, will get some exertional SOB at times Scheduled for ankle surgery Dec 17th   PAIN:  Are you having pain:  6/10 Location/description: R low back, BIL hips; will occasionally refer to posterolateral R thigh  Best-worst over past week: 6-8/10  - aggravating factors: walking, bending forward, coughing (induces a pulling feeling in back), STS, lifting/carrying - Easing factors: heating pads, icy hot, leaning fwd  PRECAUTIONS: None  RED FLAGS: None   WEIGHT BEARING RESTRICTIONS: No  FALLS:  Has patient fallen in last 6 months? No - does report a near fall last week in which she irritated the R side of her back/hip  OCCUPATION: youth worker  PLOF: Independent  PATIENT GOALS:  be more mobile, feel less limited, be able to do more   NEXT MD VISIT: TBD  OBJECTIVE:  Note: Objective measures were completed at Evaluation unless otherwise noted.  DIAGNOSTIC FINDINGS:  No recent spine imaging in chart - pt reports XR showing arthritis   PATIENT SURVEYS:  ODI: 21/50; 42%   COGNITION: Overall cognitive status: Within functional limits for tasks assessed     SENSATION: Denies sensory complaints - mild tingling about lateral knee on R side, otherwise LT intact throughout  LUMBAR ROM:   AROM eval  Flexion Mid shin relieving  Extension 75% *   Right lateral flexion 25% *  Left lateral flexion   Right rotation 25% s  Left rotation 25% s *   (Blank rows = not tested) (Key: WFL = within functional limits not formally assessed, * = concordant pain, s = stiffness/stretching sensation, NT = not tested) Comment:    LOWER EXTREMITY MMT:    MMT Right eval Left eval  Hip flexion 4- 5  Hip abduction (modified sitting) 5 5  Hip internal rotation 4 4+  Hip external rotation 4 4+  Knee flexion 4+ 4+  Knee extension 4+ 4+  Ankle dorsiflexion     (Blank rows = not tested) (Key: WFL = within functional limits not formally assessed, * = concordant pain, s = stiffness/stretching sensation, NT = not tested)  Comments:     FUNCTIONAL TESTS:  SLS:  R: 3 sec  L: 1 sec    GAIT: Distance walked: within clinic Assistive device utilized: None Level of assistance: Complete Independence Comments: reduced truncal rotation BIL, mildly widened BOS   TREATMENT:  [facility] Adult PT Treatment:                                                DATE: 09/15/24 Therapeutic Exercise: HEP discussion/education, handout provided, discussed modification PRN  PATIENT EDUCATION:  Education details: Pt education on PT impairments, prognosis, and POC. Informed consent. Rationale for interventions, safe/appropriate HEP performance Person educated: Patient Education method: Explanation, Demonstration, Tactile cues, Verbal cues Education comprehension: verbalized understanding, returned demonstration, verbal cues required, tactile cues required, and needs further education    HOME EXERCISE PROGRAM: Access Code: [email or web address] URL: [email or web address] Date: 09/15/2024 Prepared by: Alm Jenny  Exercises - Seated Flexion Stretch  - 2-3 x daily - 1 sets - 8 reps - Standing March with Counter Support  - 2-3 x daily - 1 sets - 8 reps  ASSESSMENT:  CLINICAL IMPRESSION: Patient is a pleasant 58 y.o. woman who was seen today for physical therapy evaluation and treatment for back pain ongoing over past few months. Red flag questioning reassuring today. She endorses difficulty with heavier activities and prolonged activity, with standing and sitting tolerance limited. On exam she demonstrates concordant limitations in lumbar mobility, hip strength, and LE stability (SLS). Mild symptom irritability but remains around baseline as session goes on, no adverse events. Recommend PT to address aforementioned deficits with aim of maximizing tolerance to daily/work tasks. Of note she is scheduled for LE surgery in mid-December; discussed w/ pt at that point we would plan  to discharge from PT given change in medical status, would need new referral to resume PT (for either LE or back). Pt departs today's session in no acute distress, all voiced questions/concerns addressed appropriately from PT perspective.      OBJECTIVE IMPAIRMENTS: decreased activity tolerance, decreased balance, decreased endurance, decreased mobility, difficulty walking, decreased ROM, decreased strength, impaired perceived functional ability, improper body mechanics, postural dysfunction, and pain.   ACTIVITY LIMITATIONS: carrying, lifting, bending, sitting, standing, squatting, sleeping, stairs, transfers, and locomotion level  PARTICIPATION LIMITATIONS: meal prep, cleaning, laundry, shopping, community activity, and occupation  PERSONAL FACTORS: Time since onset of injury/illness/exacerbation and 3+ comorbidities: asthma, CHF, GERD, HTN, aortic insufficiency, headaches, hx SAH, OSA are also affecting patient's functional outcome.   REHAB POTENTIAL: Fair given comorbidities  CLINICAL DECISION MAKING: Stable/uncomplicated  EVALUATION COMPLEXITY: Low   GOALS:  SHORT/LONG TERM GOALS: Target date: 10/13/2024  Pt will demonstrate appropriate understanding and performance of initially prescribed HEP in order to facilitate improved independence with management of symptoms.  Baseline: HEP established  Goal status: INITIAL   2. Pt will report at least 25% improvement in overall pain levels over past week in order to facilitate improved tolerance to typical daily activities.   Baseline: 6-8/10  Goal status: INITIAL    3. Pt will improve at least 15% on ODI in order to demonstrate improved perception of functional status due to symptoms.  Baseline: 41% Goal status: INITIAL  4. Pt will perform SLS on RLE for at least 10 sec in order to demonstrate improved postural stability.   Baseline: 1 sec LLE, 3 sec RLE   Goal status: INITIAL   5. Pt will report ability to stand for at least 25 min  with less </=2/10 increase in pain on NPS in order to facilitate improved tolerance to work tasks.  Baseline: pain with standing 10 min  Goal status: INITIAL   PLAN:  PT FREQUENCY: 2x/week  PT DURATION: 4 weeks  PLANNED INTERVENTIONS: 97164- PT Re-evaluation, 97750- Physical Performance Testing, 97110-Therapeutic exercises, 97530- Therapeutic activity, [id number]- Neuromuscular re-education, 97535- Self Care, 02859- Manual therapy, 97116- Gait training, [phone] (1-2 muscles), 20561 (3+ muscles)- Dry Needling, Patient/Family education, Balance training, Stair training, Taping, Joint mobilization, Spinal mobilization, Cryotherapy, and Moist heat.  PLAN FOR  NEXT SESSION: Review/update HEP PRN. Work on [employer] exercises as appropriate with emphasis on core strength/stability, hip strength, balance. Symptom modification strategies as indicated/appropriate. Mindful of cardiac/neuro hx.    Alm DELENA Jenny PT, DPT 09/15/2024 3:45 PM

## 2024-09-15 ENCOUNTER — Other Ambulatory Visit: Payer: Self-pay

## 2024-09-15 ENCOUNTER — Ambulatory Visit: Attending: Internal Medicine | Admitting: Physical Therapy

## 2024-09-15 ENCOUNTER — Encounter: Payer: Self-pay | Admitting: Physical Therapy

## 2024-09-15 DIAGNOSIS — M25552 Pain in left hip: Secondary | ICD-10-CM | POA: Diagnosis present

## 2024-09-15 DIAGNOSIS — M25551 Pain in right hip: Secondary | ICD-10-CM | POA: Diagnosis present

## 2024-09-15 DIAGNOSIS — M5459 Other low back pain: Secondary | ICD-10-CM | POA: Insufficient documentation

## 2024-09-15 DIAGNOSIS — M6281 Muscle weakness (generalized): Secondary | ICD-10-CM | POA: Insufficient documentation

## 2024-09-17 ENCOUNTER — Ambulatory Visit

## 2024-09-17 DIAGNOSIS — M6281 Muscle weakness (generalized): Secondary | ICD-10-CM

## 2024-09-17 DIAGNOSIS — M5459 Other low back pain: Secondary | ICD-10-CM | POA: Diagnosis not present

## 2024-09-17 DIAGNOSIS — M25551 Pain in right hip: Secondary | ICD-10-CM

## 2024-09-17 NOTE — Therapy (Signed)
 OUTPATIENT PHYSICAL THERAPY THORACOLUMBAR TREATMENT   Patient Name: Jessica Snow MRN: [id number] DOB:Nov 02, 1965, 58 y.o., female Today's Date: 09/17/2024  END OF SESSION:  PT End of Session - 09/17/24 0759     Visit Number 2    Number of Visits 9    Date for Recertification  10/13/24    Authorization Type aetna    PT Start Time 0800    PT Stop Time 0845    PT Time Calculation (min) 45 min    Activity Tolerance Patient tolerated treatment well    Behavior During Therapy [facility] for tasks assessed/performed          Past Medical History:  Diagnosis Date   Ascending aorta dilatation    Asthma    Chronic diastolic CHF (congestive heart failure) (HCC)    Chronic rhinitis    Cough    Dilation of pulmonary artery (HCC)    Edema, lower extremity    GERD (gastroesophageal reflux disease)    HTN (hypertension)    Mild aortic insufficiency    Morbid obesity (HCC)    Murmur    Pneumonia    Pre-diabetes    Past Surgical History:  Procedure Laterality Date   ANEURYSM COILING  01/24/2023   ANKLE RECONSTRUCTION     BREAST BIOPSY Right 08/25/2024   MM RT BREAST BX W LOC DEV 1ST LESION IMAGE BX SPEC STEREO GUIDE 08/25/2024 [facility]   TONSILLECTOMY     TOTAL VAGINAL HYSTERECTOMY  2007   Patient Active Problem List   Diagnosis Date Noted   Bee allergy  status 02/23/2024   Chronic nonintractable headache 02/23/2024   Encounter for annual health examination 02/19/2024   Tension headache 06/07/2023   Other sequelae following nontraumatic subarachnoid hemorrhage 02/19/2023   Pneumonia, organism unspecified(486) 02/19/2023   Severe persistent asthma (HCC) 02/19/2023   OSA (obstructive sleep apnea) 02/19/2023   Dyslipidemia 02/19/2023   Pneumonia of both lower lobes due to infectious organism 01/30/2023   SAH (subarachnoid hemorrhage) (HCC) 01/23/2023   Tinea pedis of both feet 06/23/2022   Hypertensive heart disease with chronic diastolic congestive heart failure (HCC)  06/17/2022   Pure hypercholesterolemia 06/17/2022   Dilation of pulmonary artery (HCC) 08/14/2021   Type 2 diabetes mellitus with other specified complication, without long-term current use of insulin (HCC) 02/01/2020   Snoring 09/29/2019   Diastolic heart failure (HCC) 08/31/2018   Chronic rhinitis 07/16/2018   Gastroesophageal reflux disease 04/10/2018   Cough variant asthma vs uacs  04/02/2018   Left tibial fracture 06/07/2016   Post-tonsillectomy hemorrhage 04/26/2016   Class 3 severe obesity due to excess calories with serious comorbidity and body mass index (BMI) of 45.0 to 49.9 in adult [facility]) 01/20/2012   Murmur 01/20/2012   Essential hypertension     PCP: Jarold Medici, MD  REFERRING PROVIDER: Curtis Debby PARAS, MD  REFERRING DIAG: M54.50 (ICD-10-CM) - Low back pain  Rationale for Evaluation and Treatment: Rehabilitation  THERAPY DIAG:  Other low back pain  Pain of both hip joints  Muscle weakness (generalized)  ONSET DATE: March 2025  SUBJECTIVE:  SUBJECTIVE STATEMENT: Patient reports 5/10 pain across bilateral low back today.   EVAL: States this episode began during a prolonged hospitalization in March, reduced mobility. She does report some off and on pain with work activities previously. States she got XR of low back, states she was told she has some arthritis in her hip and spine. Reports symptoms were irritated with a near fall last week. Reports ~10 min standing tolerance, 30 min sitting tolerance. Walking limited by exertional SOB but back will bother her as well. No bowel/bladder changes, no N/T, no saddle anesthesia. No fevers/chills.  PERTINENT HISTORY:  asthma, CHF, GERD, HTN, aortic insufficiency, headaches, hx SAH, OSA Pt reports she will still get R sided  headaches/tension, stable  Denies chest pain, will get some exertional SOB at times Scheduled for ankle surgery Dec 17th   PAIN:  Are you having pain: 5/10 Location/description: R low back, BIL hips; will occasionally refer to posterolateral R thigh  Best-worst over past week: 6-8/10  - aggravating factors: walking, bending forward, coughing (induces a pulling feeling in back), STS, lifting/carrying - Easing factors: heating pads, icy hot, leaning fwd  PRECAUTIONS: None  RED FLAGS: None   WEIGHT BEARING RESTRICTIONS: No  FALLS:  Has patient fallen in last 6 months? No - does report a near fall last week in which she irritated the R side of her back/hip  OCCUPATION: youth worker  PLOF: Independent  PATIENT GOALS:  be more mobile, feel less limited, be able to do more   NEXT MD VISIT: TBD  OBJECTIVE:  Note: Objective measures were completed at Evaluation unless otherwise noted.  DIAGNOSTIC FINDINGS:  No recent spine imaging in chart - pt reports XR showing arthritis   PATIENT SURVEYS:  ODI: 21/50; 42%   COGNITION: Overall cognitive status: Within functional limits for tasks assessed     SENSATION: Denies sensory complaints - mild tingling about lateral knee on R side, otherwise LT intact throughout  LUMBAR ROM:   AROM eval  Flexion Mid shin relieving  Extension 75% *   Right lateral flexion 25% *  Left lateral flexion   Right rotation 25% s  Left rotation 25% s *   (Blank rows = not tested) (Key: WFL = within functional limits not formally assessed, * = concordant pain, s = stiffness/stretching sensation, NT = not tested) Comment:    LOWER EXTREMITY MMT:    MMT Right eval Left eval  Hip flexion 4- 5  Hip abduction (modified sitting) 5 5  Hip internal rotation 4 4+  Hip external rotation 4 4+  Knee flexion 4+ 4+  Knee extension 4+ 4+  Ankle dorsiflexion     (Blank rows = not tested) (Key: WFL = within functional limits not formally assessed, *  = concordant pain, s = stiffness/stretching sensation, NT = not tested)  Comments:     FUNCTIONAL TESTS:  SLS:  R: 3 sec  L: 1 sec   GAIT: Distance walked: within clinic Assistive device utilized: None Level of assistance: Complete Independence Comments: reduced truncal rotation BIL, mildly widened BOS   TREATMENT:  [facility] Adult PT Treatment:                                                DATE: 09/17/2024 Neuromuscular re-ed: Hooklying hip add ball squeeze + exhale cue Hoklying hip abd + hold 10 x  3 sec Seated TA activation + shhh breathing Supine posterior pelvic tilts  Therapeutic Activity: Seated trunk flexion stretch with red PB roll out/in + breathing cue x10 Supine LTR x 1 min LTR with pursed lip breathing x 10 Supine diaphragmatic breathing    [facility] Adult PT Treatment:                                                DATE: 09/15/24 Therapeutic Exercise: HEP discussion/education, handout provided, discussed modification PRN                                                                                                                           PATIENT EDUCATION:  Education details: Updated HEP Person educated: Patient Education method: Explanation, Demonstration, Tactile cues, Verbal cues Education comprehension: verbalized understanding, returned demonstration, verbal cues required, tactile cues required, and needs further education    HOME EXERCISE PROGRAM: Access Code: [email or web address] URL: [email or web address] Date: 09/17/2024 Prepared by: Lamarr Price  Exercises - Seated Flexion Stretch  - 2-3 x daily - 1 sets - 8 reps - Standing March with Counter Support  - 2-3 x daily - 1 sets - 8 reps - Supine Posterior Pelvic Tilt  - 2 x daily - 7 x weekly - 1 sets - 10 reps - 3 sec hold - Supine Lower Trunk Rotation with PLB  - 2 x daily - 7 x weekly - 2 sets - 20 reps - Hooklying Single Knee to Chest Stretch with Towel  - 2 x daily - 7 x weekly - 1 sets - 3-5  reps - 20 sec hold - Seated Transversus Abdominis Bracing  - 2 x daily - 7 x weekly - 1 sets - 10 reps - 5 sec hold - Supine Diaphragmatic Breathing  - 1 x daily - 7 x weekly - 1 sets - 10 reps - Seated Diaphragmatic Breathing  - 1 x daily - 7 x weekly - 1 sets - 10 reps  ASSESSMENT:  CLINICAL IMPRESSION: HEP reviewed and added new exercises focused on lumbar mobility and core strengthening. Cueing provided to decrease gluteal gripping compensation during hip adduction isometric exercise. Diaphragmatic incorporated to promote relaxation and address lumbar tension. At end of session, patient pointed out significant trigger point in Rt upper gluteal'; will add to plan for next visit to address with myofascial release and continue glute/hip adductor strengthening.    EVAL: Patient is a pleasant 58 y.o. woman who was seen today for physical therapy evaluation and treatment for back pain ongoing over past few months. Red flag questioning reassuring today. She endorses difficulty with heavier activities and prolonged activity, with standing and sitting tolerance limited. On exam she demonstrates concordant limitations in lumbar mobility, hip strength, and LE stability (SLS). Mild symptom irritability but remains around baseline as session goes on, no adverse events.  Recommend PT to address aforementioned deficits with aim of maximizing tolerance to daily/work tasks. Of note she is scheduled for LE surgery in mid-December; discussed w/ pt at that point we would plan to discharge from PT given change in medical status, would need new referral to resume PT (for either LE or back). Pt departs today's session in no acute distress, all voiced questions/concerns addressed appropriately from PT perspective.      OBJECTIVE IMPAIRMENTS: decreased activity tolerance, decreased balance, decreased endurance, decreased mobility, difficulty walking, decreased ROM, decreased strength, impaired perceived functional ability,  improper body mechanics, postural dysfunction, and pain.   ACTIVITY LIMITATIONS: carrying, lifting, bending, sitting, standing, squatting, sleeping, stairs, transfers, and locomotion level  PARTICIPATION LIMITATIONS: meal prep, cleaning, laundry, shopping, community activity, and occupation  PERSONAL FACTORS: Time since onset of injury/illness/exacerbation and 3+ comorbidities: asthma, CHF, GERD, HTN, aortic insufficiency, headaches, hx SAH, OSA are also affecting patient's functional outcome.   REHAB POTENTIAL: Fair given comorbidities  CLINICAL DECISION MAKING: Stable/uncomplicated  EVALUATION COMPLEXITY: Low   GOALS:  SHORT/LONG TERM GOALS: Target date: 10/13/2024  Pt will demonstrate appropriate understanding and performance of initially prescribed HEP in order to facilitate improved independence with management of symptoms.  Baseline: HEP established  Goal status: INITIAL   2. Pt will report at least 25% improvement in overall pain levels over past week in order to facilitate improved tolerance to typical daily activities.   Baseline: 6-8/10  Goal status: INITIAL    3. Pt will improve at least 15% on ODI in order to demonstrate improved perception of functional status due to symptoms.  Baseline: 41% Goal status: INITIAL  4. Pt will perform SLS on RLE for at least 10 sec in order to demonstrate improved postural stability.   Baseline: 1 sec LLE, 3 sec RLE   Goal status: INITIAL   5. Pt will report ability to stand for at least 25 min with less </=2/10 increase in pain on NPS in order to facilitate improved tolerance to work tasks.  Baseline: pain with standing 10 min  Goal status: INITIAL   PLAN:  PT FREQUENCY: 2x/week  PT DURATION: 4 weeks  PLANNED INTERVENTIONS: 97164- PT Re-evaluation, 97750- Physical Performance Testing, 97110-Therapeutic exercises, 97530- Therapeutic activity, [id number]- Neuromuscular re-education, 97535- Self Care, 02859- Manual therapy, 97116- Gait  training, [phone] (1-2 muscles), 20561 (3+ muscles)- Dry Needling, Patient/Family education, Balance training, Stair training, Taping, Joint mobilization, Spinal mobilization, Cryotherapy, and Moist heat.  PLAN FOR NEXT SESSION: Myofascial release Rt glute. Work on [employer] exercises as appropriate with emphasis on core strength/stability, hip strength, balance. Symptom modification strategies as indicated/appropriate. Mindful of cardiac/neuro hx.    Lamarr Price, PTA 09/17/2024 9:09 AM

## 2024-09-18 ENCOUNTER — Other Ambulatory Visit: Payer: Self-pay | Admitting: Internal Medicine

## 2024-09-22 ENCOUNTER — Encounter: Payer: Self-pay | Admitting: Physical Therapy

## 2024-09-22 ENCOUNTER — Ambulatory Visit (INDEPENDENT_AMBULATORY_CARE_PROVIDER_SITE_OTHER)

## 2024-09-22 ENCOUNTER — Ambulatory Visit: Payer: Self-pay | Admitting: Physical Therapy

## 2024-09-22 DIAGNOSIS — J4550 Severe persistent asthma, uncomplicated: Secondary | ICD-10-CM

## 2024-09-22 DIAGNOSIS — M6281 Muscle weakness (generalized): Secondary | ICD-10-CM

## 2024-09-22 DIAGNOSIS — M5459 Other low back pain: Secondary | ICD-10-CM | POA: Diagnosis not present

## 2024-09-22 DIAGNOSIS — M25551 Pain in right hip: Secondary | ICD-10-CM

## 2024-09-22 NOTE — Therapy (Signed)
 OUTPATIENT PHYSICAL THERAPY TREATMENT   Patient Name: Jessica Snow MRN: [id number] DOB:11-08-1965, 58 y.o., female Today's Date: 09/22/2024  END OF SESSION:  PT End of Session - 09/22/24 0716     Visit Number 3    Number of Visits 9    Date for Recertification  10/13/24    Authorization Type aetna    PT Start Time 0716    PT Stop Time 0755    PT Time Calculation (min) 39 min           Past Medical History:  Diagnosis Date   Ascending aorta dilatation    Asthma    Chronic diastolic CHF (congestive heart failure) (HCC)    Chronic rhinitis    Cough    Dilation of pulmonary artery (HCC)    Edema, lower extremity    GERD (gastroesophageal reflux disease)    HTN (hypertension)    Mild aortic insufficiency    Morbid obesity (HCC)    Murmur    Pneumonia    Pre-diabetes    Past Surgical History:  Procedure Laterality Date   ANEURYSM COILING  01/24/2023   ANKLE RECONSTRUCTION     BREAST BIOPSY Right 08/25/2024   MM RT BREAST BX W LOC DEV 1ST LESION IMAGE BX SPEC STEREO GUIDE 08/25/2024 [facility]   TONSILLECTOMY     TOTAL VAGINAL HYSTERECTOMY  2007   Patient Active Problem List   Diagnosis Date Noted   Bee allergy  status 02/23/2024   Chronic nonintractable headache 02/23/2024   Encounter for annual health examination 02/19/2024   Tension headache 06/07/2023   Other sequelae following nontraumatic subarachnoid hemorrhage 02/19/2023   Pneumonia, organism unspecified(486) 02/19/2023   Severe persistent asthma (HCC) 02/19/2023   OSA (obstructive sleep apnea) 02/19/2023   Dyslipidemia 02/19/2023   Pneumonia of both lower lobes due to infectious organism 01/30/2023   SAH (subarachnoid hemorrhage) (HCC) 01/23/2023   Tinea pedis of both feet 06/23/2022   Hypertensive heart disease with chronic diastolic congestive heart failure (HCC) 06/17/2022   Pure hypercholesterolemia 06/17/2022   Dilation of pulmonary artery (HCC) 08/14/2021   Type 2 diabetes mellitus  with other specified complication, without long-term current use of insulin (HCC) 02/01/2020   Snoring 09/29/2019   Diastolic heart failure (HCC) 08/31/2018   Chronic rhinitis 07/16/2018   Gastroesophageal reflux disease 04/10/2018   Cough variant asthma vs uacs  04/02/2018   Left tibial fracture 06/07/2016   Post-tonsillectomy hemorrhage 04/26/2016   Class 3 severe obesity due to excess calories with serious comorbidity and body mass index (BMI) of 45.0 to 49.9 in adult [facility]) 01/20/2012   Murmur 01/20/2012   Essential hypertension     PCP: Jarold Medici, MD  REFERRING PROVIDER: Curtis Debby PARAS, MD  REFERRING DIAG: M54.50 (ICD-10-CM) - Low back pain  Rationale for Evaluation and Treatment: Rehabilitation  THERAPY DIAG:  Other low back pain  Pain of both hip joints  Muscle weakness (generalized)  ONSET DATE: March 2025  SUBJECTIVE:  SUBJECTIVE STATEMENT: 09/22/2024: felt a bit better after last session. She feels like medicine and therapy have both been helpful. Feeling a little tender on R hip, feeling less of the bilateral pain. Still having trouble at times with prolonged walking (grocery store over the weekend).   EVAL: States this episode began during a prolonged hospitalization in March, reduced mobility. She does report some off and on pain with work activities previously. States she got XR of low back, states she was told she has some arthritis in her hip and spine. Reports symptoms were irritated with a near fall last week. Reports ~10 min standing tolerance, 30 min sitting tolerance. Walking limited by exertional SOB but back will bother her as well. No bowel/bladder changes, no N/T, no saddle anesthesia. No fevers/chills.  PERTINENT HISTORY:  asthma, CHF, GERD, HTN, aortic  insufficiency, headaches, hx SAH, OSA Pt reports she will still get R sided headaches/tension, stable  Denies chest pain, will get some exertional SOB at times Scheduled for ankle surgery Dec 17th   PAIN:  Are you having pain: no resting pain, 2-3/10 with movement in R hip;5/10 at worst in past week  Per eval: Location/description: R low back, BIL hips; will occasionally refer to posterolateral R thigh  Best-worst over past week: 6-8/10  - aggravating factors: walking, bending forward, coughing (induces a pulling feeling in back), STS, lifting/carrying - Easing factors: heating pads, icy hot, leaning fwd  PRECAUTIONS: None  RED FLAGS: None   WEIGHT BEARING RESTRICTIONS: No  FALLS:  Has patient fallen in last 6 months? No - does report a near fall last week in which she irritated the R side of her back/hip  OCCUPATION: youth worker  PLOF: Independent  PATIENT GOALS:  be more mobile, feel less limited, be able to do more   NEXT MD VISIT: TBD  OBJECTIVE:  Note: Objective measures were completed at Evaluation unless otherwise noted.  DIAGNOSTIC FINDINGS:  No recent spine imaging in chart - pt reports XR showing arthritis   PATIENT SURVEYS:  ODI: 21/50; 42%   COGNITION: Overall cognitive status: Within functional limits for tasks assessed     SENSATION: Denies sensory complaints - mild tingling about lateral knee on R side, otherwise LT intact throughout  LUMBAR ROM:   AROM eval 09/22/24  Flexion Mid shin relieving   Extension 75% *    Right lateral flexion 25% *   Left lateral flexion    Right rotation 25% s 25% s  Left rotation 25% s * 50% s    (Blank rows = not tested) (Key: WFL = within functional limits not formally assessed, * = concordant pain, s = stiffness/stretching sensation, NT = not tested) Comment:    LOWER EXTREMITY MMT:    MMT Right eval Left eval  Hip flexion 4- 5  Hip abduction (modified sitting) 5 5  Hip internal rotation 4 4+   Hip external rotation 4 4+  Knee flexion 4+ 4+  Knee extension 4+ 4+  Ankle dorsiflexion     (Blank rows = not tested) (Key: WFL = within functional limits not formally assessed, * = concordant pain, s = stiffness/stretching sensation, NT = not tested)  Comments:     FUNCTIONAL TESTS:  SLS:  R: 3 sec  L: 1 sec   GAIT: Distance walked: within clinic Assistive device utilized: None Level of assistance: Complete Independence Comments: reduced truncal rotation BIL, mildly widened BOS   TREATMENT:   [facility] Adult PT Treatment:  DATE: 09/22/24 Therapeutic Exercise: LTR x10 BIL cues for breath control and comfortable ROM DKTC w/ swiss ball, x15 cues for breath control and comfortable ROM  Seated pelvic tilts on mat x10  HEP update + education/handout  Neuromuscular re-ed: LTR w/ swiss ball x10 cues for motor control, pacing, and breath control  Seated pelvic tilts on dynadisc 2x10; cues for reduced thoracolumbar compensations, breath control, motor control  Glute med press into ball at wall x8 BIL cues for breath control and comfortable force output Inc time for education/discussion re: pacing, reducing muscle guarding compensations, breath control      [facility] Adult PT Treatment:                                                DATE: 09/17/2024 Neuromuscular re-ed: Hooklying hip add ball squeeze + exhale cue Hoklying hip abd + hold 10 x 3 sec Seated TA activation + shhh breathing Supine posterior pelvic tilts  Therapeutic Activity: Seated trunk flexion stretch with red PB roll out/in + breathing cue x10 Supine LTR x 1 min LTR with pursed lip breathing x 10 Supine diaphragmatic breathing                                                                                                                            PATIENT EDUCATION:  Education details: rationale for interventions, HEP  Person educated: Patient Education method:  Explanation, Demonstration, Tactile cues, Verbal cues Education comprehension: verbalized understanding, returned demonstration, verbal cues required, tactile cues required, and needs further education     HOME EXERCISE PROGRAM: Access Code: [email or web address] URL: [email or web address] Date: 09/22/2024 Prepared by: Alm Jenny  Exercises - Seated Flexion Stretch  - 2-3 x daily - 1 sets - 8 reps - Standing March with Counter Support  - 2-3 x daily - 1 sets - 8 reps - Supine Lower Trunk Rotation with PLB  - 2 x daily - 7 x weekly - 2 sets - 20 reps - Hooklying Single Knee to Chest Stretch with Towel  - 2 x daily - 7 x weekly - 1 sets - 3-5 reps - 20 sec hold - Seated Transversus Abdominis Bracing  - 2 x daily - 7 x weekly - 1 sets - 10 reps - 5 sec hold - Supine Diaphragmatic Breathing  - 1 x daily - 7 x weekly - 1 sets - 10 reps - Seated Diaphragmatic Breathing  - 1 x daily - 7 x weekly - 1 sets - 10 reps - Seated Pelvic Tilt  - 1 x daily - 7 x weekly - 2 sets - 10 reps  ASSESSMENT:  CLINICAL IMPRESSION: 09/22/2024: Pt arrives w/o resting pain, reports gradually improving intensity/frequency of pain which she attributes to both exercise and medication. Reports good response to last session. We continue with themes of gentle mobility  incorporating breath work and lumbopelvic stability, cues as above. We progress to include more focus on R hip given pt report of more persistent pain in this area. No adverse events, tolerates session well with reports of mild muscle fatigue as expected but no increase in pain. Recommend continuing along current POC in order to address relevant deficits and improve functional tolerance. Pt departs today's session in no acute distress, all voiced questions/concerns addressed appropriately from PT perspective.     EVAL: Patient is a pleasant 58 y.o. woman who was seen today for physical therapy evaluation and treatment for back pain ongoing over past few  months. Red flag questioning reassuring today. She endorses difficulty with heavier activities and prolonged activity, with standing and sitting tolerance limited. On exam she demonstrates concordant limitations in lumbar mobility, hip strength, and LE stability (SLS). Mild symptom irritability but remains around baseline as session goes on, no adverse events. Recommend PT to address aforementioned deficits with aim of maximizing tolerance to daily/work tasks. Of note she is scheduled for LE surgery in mid-December; discussed w/ pt at that point we would plan to discharge from PT given change in medical status, would need new referral to resume PT (for either LE or back). Pt departs today's session in no acute distress, all voiced questions/concerns addressed appropriately from PT perspective.      OBJECTIVE IMPAIRMENTS: decreased activity tolerance, decreased balance, decreased endurance, decreased mobility, difficulty walking, decreased ROM, decreased strength, impaired perceived functional ability, improper body mechanics, postural dysfunction, and pain.   ACTIVITY LIMITATIONS: carrying, lifting, bending, sitting, standing, squatting, sleeping, stairs, transfers, and locomotion level  PARTICIPATION LIMITATIONS: meal prep, cleaning, laundry, shopping, community activity, and occupation  PERSONAL FACTORS: Time since onset of injury/illness/exacerbation and 3+ comorbidities: asthma, CHF, GERD, HTN, aortic insufficiency, headaches, hx SAH, OSA are also affecting patient's functional outcome.   REHAB POTENTIAL: Fair given comorbidities  CLINICAL DECISION MAKING: Stable/uncomplicated  EVALUATION COMPLEXITY: Low   GOALS:  SHORT/LONG TERM GOALS: Target date: 10/13/2024  Pt will demonstrate appropriate understanding and performance of initially prescribed HEP in order to facilitate improved independence with management of symptoms.  Baseline: HEP established  Goal status: INITIAL   2. Pt will  report at least 25% improvement in overall pain levels over past week in order to facilitate improved tolerance to typical daily activities.   Baseline: 6-8/10  Goal status: INITIAL    3. Pt will improve at least 15% on ODI in order to demonstrate improved perception of functional status due to symptoms.  Baseline: 41% Goal status: INITIAL  4. Pt will perform SLS on RLE for at least 10 sec in order to demonstrate improved postural stability.   Baseline: 1 sec LLE, 3 sec RLE   Goal status: INITIAL   5. Pt will report ability to stand for at least 25 min with less </=2/10 increase in pain on NPS in order to facilitate improved tolerance to work tasks.  Baseline: pain with standing 10 min  Goal status: INITIAL   PLAN:  PT FREQUENCY: 2x/week  PT DURATION: 4 weeks  PLANNED INTERVENTIONS: 97164- PT Re-evaluation, 97750- Physical Performance Testing, 97110-Therapeutic exercises, 97530- Therapeutic activity, [id number]- Neuromuscular re-education, 97535- Self Care, [id number]- Manual therapy, 97116- Gait training, [phone] (1-2 muscles), [id number] (3+ muscles)- Dry Needling, Patient/Family education, Balance training, Stair training, Taping, Joint mobilization, Spinal mobilization, Cryotherapy, and Moist heat.  PLAN FOR NEXT SESSION:  Work on ROM/strength exercises as appropriate with emphasis on core strength/stability, hip strength, balance. Symptom modification strategies as  indicated/appropriate. Mindful of cardiac/neuro hx.    Alm DELENA Jenny PT, DPT 09/22/2024 8:00 AM

## 2024-09-27 ENCOUNTER — Ambulatory Visit: Admitting: Internal Medicine

## 2024-09-27 ENCOUNTER — Ambulatory Visit: Admitting: Physical Therapy

## 2024-09-27 NOTE — Therapy (Incomplete)
 OUTPATIENT PHYSICAL THERAPY TREATMENT   Patient Name: Jessica Snow MRN: [id number] DOB:[birth year], 58 y.o., female Today's Date: 09/27/2024  END OF SESSION:     Past Medical History:  Diagnosis Date   Ascending aorta dilatation    Asthma    Chronic diastolic CHF (congestive heart failure) (HCC)    Chronic rhinitis    Cough    Dilation of pulmonary artery (HCC)    Edema, lower extremity    GERD (gastroesophageal reflux disease)    HTN (hypertension)    Mild aortic insufficiency    Morbid obesity (HCC)    Murmur    Pneumonia    Pre-diabetes    Past Surgical History:  Procedure Laterality Date   ANEURYSM COILING  01/24/2023   ANKLE RECONSTRUCTION     BREAST BIOPSY Right 08/25/2024   MM RT BREAST BX W LOC DEV 1ST LESION IMAGE BX SPEC STEREO GUIDE 08/25/2024 [facility]   TONSILLECTOMY     TOTAL VAGINAL HYSTERECTOMY  2007   Patient Active Problem List   Diagnosis Date Noted   Bee allergy  status 02/23/2024   Chronic nonintractable headache 02/23/2024   Encounter for annual health examination 02/19/2024   Tension headache 06/07/2023   Other sequelae following nontraumatic subarachnoid hemorrhage 02/19/2023   Pneumonia, organism unspecified(486) 02/19/2023   Severe persistent asthma (HCC) 02/19/2023   OSA (obstructive sleep apnea) 02/19/2023   Dyslipidemia 02/19/2023   Pneumonia of both lower lobes due to infectious organism 01/30/2023   SAH (subarachnoid hemorrhage) (HCC) 01/23/2023   Tinea pedis of both feet 06/23/2022   Hypertensive heart disease with chronic diastolic congestive heart failure (HCC) 06/17/2022   Pure hypercholesterolemia 06/17/2022   Dilation of pulmonary artery (HCC) 08/14/2021   Type 2 diabetes mellitus with other specified complication, without long-term current use of insulin (HCC) 02/01/2020   Snoring 09/29/2019   Diastolic heart failure (HCC) 08/31/2018   Chronic rhinitis 07/16/2018   Gastroesophageal reflux disease 04/10/2018    Cough variant asthma vs uacs  04/02/2018   Left tibial fracture 06/07/2016   Post-tonsillectomy hemorrhage 04/26/2016   Class 3 severe obesity due to excess calories with serious comorbidity and body mass index (BMI) of 45.0 to 49.9 in adult [facility]) 01/20/2012   Murmur 01/20/2012   Essential hypertension     PCP: Jarold Medici, MD  REFERRING PROVIDER: Curtis Debby PARAS, MD  REFERRING DIAG: M54.50 (ICD-10-CM) - Low back pain  Rationale for Evaluation and Treatment: Rehabilitation  THERAPY DIAG:  No diagnosis found.  ONSET DATE: March 2025  SUBJECTIVE:  SUBJECTIVE STATEMENT: 09/27/2024: ***  *** felt a bit better after last session. She feels like medicine and therapy have both been helpful. Feeling a little tender on R hip, feeling less of the bilateral pain. Still having trouble at times with prolonged walking (grocery store over the weekend).   EVAL: States this episode began during a prolonged hospitalization in March, reduced mobility. She does report some off and on pain with work activities previously. States she got XR of low back, states she was told she has some arthritis in her hip and spine. Reports symptoms were irritated with a near fall last week. Reports ~10 min standing tolerance, 30 min sitting tolerance. Walking limited by exertional SOB but back will bother her as well. No bowel/bladder changes, no N/T, no saddle anesthesia. No fevers/chills.  PERTINENT HISTORY:  asthma, CHF, GERD, HTN, aortic insufficiency, headaches, hx SAH, OSA Pt reports she will still get R sided headaches/tension, stable  Denies chest pain, will get some exertional SOB at times Scheduled for ankle surgery Dec 17th   PAIN:  Are you having pain: no resting pain, 2-3/10 with movement in R hip; 5/10 at worst  in past week ***   Per eval: Location/description: R low back, BIL hips; will occasionally refer to posterolateral R thigh  Best-worst over past week: 6-8/10  - aggravating factors: walking, bending forward, coughing (induces a pulling feeling in back), STS, lifting/carrying - Easing factors: heating pads, icy hot, leaning fwd  PRECAUTIONS: None  RED FLAGS: None   WEIGHT BEARING RESTRICTIONS: No  FALLS:  Has patient fallen in last 6 months? No - does report a near fall last week in which she irritated the R side of her back/hip  OCCUPATION: youth worker  PLOF: Independent  PATIENT GOALS:  be more mobile, feel less limited, be able to do more   NEXT MD VISIT: TBD  OBJECTIVE:  Note: Objective measures were completed at Evaluation unless otherwise noted.  DIAGNOSTIC FINDINGS:  No recent spine imaging in chart - pt reports XR showing arthritis   PATIENT SURVEYS:  ODI: 21/50; 42%   COGNITION: Overall cognitive status: Within functional limits for tasks assessed     SENSATION: Denies sensory complaints - mild tingling about lateral knee on R side, otherwise LT intact throughout  LUMBAR ROM:   AROM eval 09/22/24  Flexion Mid shin relieving   Extension 75% *    Right lateral flexion 25% *   Left lateral flexion    Right rotation 25% s 25% s  Left rotation 25% s * 50% s    (Blank rows = not tested) (Key: WFL = within functional limits not formally assessed, * = concordant pain, s = stiffness/stretching sensation, NT = not tested) Comment:    LOWER EXTREMITY MMT:    MMT Right eval Left eval  Hip flexion 4- 5  Hip abduction (modified sitting) 5 5  Hip internal rotation 4 4+  Hip external rotation 4 4+  Knee flexion 4+ 4+  Knee extension 4+ 4+  Ankle dorsiflexion     (Blank rows = not tested) (Key: WFL = within functional limits not formally assessed, * = concordant pain, s = stiffness/stretching sensation, NT = not tested)  Comments:     FUNCTIONAL  TESTS:  SLS:  R: 3 sec  L: 1 sec   GAIT: Distance walked: within clinic Assistive device utilized: None Level of assistance: Complete Independence Comments: reduced truncal rotation BIL, mildly widened BOS   TREATMENT:   [facility] Adult  PT Treatment:                                                DATE: 09/27/24 Therapeutic Exercise: *** Manual Therapy: *** Neuromuscular re-ed: *** Therapeutic Activity: *** Modalities: *** Self Care: ***     [facility] Adult PT Treatment:                                                DATE: 09/22/24 Therapeutic Exercise: LTR x10 BIL cues for breath control and comfortable ROM DKTC w/ swiss ball, x15 cues for breath control and comfortable ROM  Seated pelvic tilts on mat x10  HEP update + education/handout  Neuromuscular re-ed: LTR w/ swiss ball x10 cues for motor control, pacing, and breath control  Seated pelvic tilts on dynadisc 2x10; cues for reduced thoracolumbar compensations, breath control, motor control  Glute med press into ball at wall x8 BIL cues for breath control and comfortable force output Inc time for education/discussion re: pacing, reducing muscle guarding compensations, breath control                                                                                                                             PATIENT EDUCATION:  Education details: rationale for interventions, HEP  Person educated: Patient Education method: Explanation, Demonstration, Tactile cues, Verbal cues Education comprehension: verbalized understanding, returned demonstration, verbal cues required, tactile cues required, and needs further education     HOME EXERCISE PROGRAM: Access Code: [email or web address] URL: [email or web address] Date: 09/22/2024 Prepared by: Alm Jenny  Exercises - Seated Flexion Stretch  - 2-3 x daily - 1 sets - 8 reps - Standing March with Counter Support  - 2-3 x daily - 1 sets - 8 reps - Supine Lower Trunk Rotation  with PLB  - 2 x daily - 7 x weekly - 2 sets - 20 reps - Hooklying Single Knee to Chest Stretch with Towel  - 2 x daily - 7 x weekly - 1 sets - 3-5 reps - 20 sec hold - Seated Transversus Abdominis Bracing  - 2 x daily - 7 x weekly - 1 sets - 10 reps - 5 sec hold - Supine Diaphragmatic Breathing  - 1 x daily - 7 x weekly - 1 sets - 10 reps - Seated Diaphragmatic Breathing  - 1 x daily - 7 x weekly - 1 sets - 10 reps - Seated Pelvic Tilt  - 1 x daily - 7 x weekly - 2 sets - 10 reps  ASSESSMENT:  CLINICAL IMPRESSION: 09/27/2024: ***  *** Pt arrives w/o resting pain, reports gradually improving intensity/frequency of pain which she attributes to both exercise and medication. Reports good response to  last session. We continue with themes of gentle mobility incorporating breath work and lumbopelvic stability, cues as above. We progress to include more focus on R hip given pt report of more persistent pain in this area. No adverse events, tolerates session well with reports of mild muscle fatigue as expected but no increase in pain. Recommend continuing along current POC in order to address relevant deficits and improve functional tolerance. Pt departs today's session in no acute distress, all voiced questions/concerns addressed appropriately from PT perspective.     EVAL: Patient is a pleasant 58 y.o. woman who was seen today for physical therapy evaluation and treatment for back pain ongoing over past few months. Red flag questioning reassuring today. She endorses difficulty with heavier activities and prolonged activity, with standing and sitting tolerance limited. On exam she demonstrates concordant limitations in lumbar mobility, hip strength, and LE stability (SLS). Mild symptom irritability but remains around baseline as session goes on, no adverse events. Recommend PT to address aforementioned deficits with aim of maximizing tolerance to daily/work tasks. Of note she is scheduled for LE surgery in  mid-December; discussed w/ pt at that point we would plan to discharge from PT given change in medical status, would need new referral to resume PT (for either LE or back). Pt departs today's session in no acute distress, all voiced questions/concerns addressed appropriately from PT perspective.      OBJECTIVE IMPAIRMENTS: decreased activity tolerance, decreased balance, decreased endurance, decreased mobility, difficulty walking, decreased ROM, decreased strength, impaired perceived functional ability, improper body mechanics, postural dysfunction, and pain.   ACTIVITY LIMITATIONS: carrying, lifting, bending, sitting, standing, squatting, sleeping, stairs, transfers, and locomotion level  PARTICIPATION LIMITATIONS: meal prep, cleaning, laundry, shopping, community activity, and occupation  PERSONAL FACTORS: Time since onset of injury/illness/exacerbation and 3+ comorbidities: asthma, CHF, GERD, HTN, aortic insufficiency, headaches, hx SAH, OSA are also affecting patient's functional outcome.   REHAB POTENTIAL: Fair given comorbidities  CLINICAL DECISION MAKING: Stable/uncomplicated  EVALUATION COMPLEXITY: Low   GOALS:  SHORT/LONG TERM GOALS: Target date: 10/13/2024  Pt will demonstrate appropriate understanding and performance of initially prescribed HEP in order to facilitate improved independence with management of symptoms.  Baseline: HEP established  Goal status: INITIAL   2. Pt will report at least 25% improvement in overall pain levels over past week in order to facilitate improved tolerance to typical daily activities.   Baseline: 6-8/10  Goal status: INITIAL    3. Pt will improve at least 15% on ODI in order to demonstrate improved perception of functional status due to symptoms.  Baseline: 41% Goal status: INITIAL  4. Pt will perform SLS on RLE for at least 10 sec in order to demonstrate improved postural stability.   Baseline: 1 sec LLE, 3 sec RLE   Goal status: INITIAL    5. Pt will report ability to stand for at least 25 min with less </=2/10 increase in pain on NPS in order to facilitate improved tolerance to work tasks.  Baseline: pain with standing 10 min  Goal status: INITIAL   PLAN:  PT FREQUENCY: 2x/week  PT DURATION: 4 weeks  PLANNED INTERVENTIONS: 97164- PT Re-evaluation, 97750- Physical Performance Testing, 97110-Therapeutic exercises, 97530- Therapeutic activity, [id number]- Neuromuscular re-education, 97535- Self Care, [id number]- Manual therapy, 97116- Gait training, [phone] (1-2 muscles), [id number] (3+ muscles)- Dry Needling, Patient/Family education, Balance training, Stair training, Taping, Joint mobilization, Spinal mobilization, Cryotherapy, and Moist heat.  PLAN FOR NEXT SESSION:  Work on ROM/strength exercises as appropriate with emphasis on  core strength/stability, hip strength, balance. Symptom modification strategies as indicated/appropriate. Mindful of cardiac/neuro hx.    Alm DELENA Jenny PT, DPT 09/27/2024 7:12 AM

## 2024-09-28 NOTE — Therapy (Signed)
 OUTPATIENT PHYSICAL THERAPY TREATMENT   Patient Name: Jessica Snow MRN: [id number] DOB:02-Oct-1966, 58 y.o., female Today's Date: 09/29/2024  END OF SESSION:  PT End of Session - 09/29/24 0758     Visit Number 4    Number of Visits 9    Date for Recertification  10/13/24    Authorization Type aetna    PT Start Time 0800    PT Stop Time 0842    PT Time Calculation (min) 42 min            Past Medical History:  Diagnosis Date   Ascending aorta dilatation    Asthma    Chronic diastolic CHF (congestive heart failure) (HCC)    Chronic rhinitis    Cough    Dilation of pulmonary artery (HCC)    Edema, lower extremity    GERD (gastroesophageal reflux disease)    HTN (hypertension)    Mild aortic insufficiency    Morbid obesity (HCC)    Murmur    Pneumonia    Pre-diabetes    Past Surgical History:  Procedure Laterality Date   ANEURYSM COILING  01/24/2023   ANKLE RECONSTRUCTION     BREAST BIOPSY Right 08/25/2024   MM RT BREAST BX W LOC DEV 1ST LESION IMAGE BX SPEC STEREO GUIDE 08/25/2024 [facility]   TONSILLECTOMY     TOTAL VAGINAL HYSTERECTOMY  2007   Patient Active Problem List   Diagnosis Date Noted   Bee allergy  status 02/23/2024   Chronic nonintractable headache 02/23/2024   Encounter for annual health examination 02/19/2024   Tension headache 06/07/2023   Other sequelae following nontraumatic subarachnoid hemorrhage 02/19/2023   Pneumonia, organism unspecified(486) 02/19/2023   Severe persistent asthma (HCC) 02/19/2023   OSA (obstructive sleep apnea) 02/19/2023   Dyslipidemia 02/19/2023   Pneumonia of both lower lobes due to infectious organism 01/30/2023   SAH (subarachnoid hemorrhage) (HCC) 01/23/2023   Tinea pedis of both feet 06/23/2022   Hypertensive heart disease with chronic diastolic congestive heart failure (HCC) 06/17/2022   Pure hypercholesterolemia 06/17/2022   Dilation of pulmonary artery (HCC) 08/14/2021   Type 2 diabetes mellitus  with other specified complication, without long-term current use of insulin (HCC) 02/01/2020   Snoring 09/29/2019   Diastolic heart failure (HCC) 08/31/2018   Chronic rhinitis 07/16/2018   Gastroesophageal reflux disease 04/10/2018   Cough variant asthma vs uacs  04/02/2018   Left tibial fracture 06/07/2016   Post-tonsillectomy hemorrhage 04/26/2016   Class 3 severe obesity due to excess calories with serious comorbidity and body mass index (BMI) of 45.0 to 49.9 in adult [facility]) 01/20/2012   Murmur 01/20/2012   Essential hypertension     PCP: Jarold Medici, MD  REFERRING PROVIDER: Curtis Debby PARAS, MD  REFERRING DIAG: M54.50 (ICD-10-CM) - Low back pain  Rationale for Evaluation and Treatment: Rehabilitation  THERAPY DIAG:  Other low back pain  Pain of both hip joints  Muscle weakness (generalized)  ONSET DATE: March 2025  SUBJECTIVE:  SUBJECTIVE STATEMENT: 09/29/2024: had to do extra work Monday and felt a bit more burning sensation on the R hip, L side not bothering her as much. Has been doing her exercises, they tend to help keep things from getting as irritated, but once it is irritated they don't tend to change symptoms much. Felt good after last session. No other new updates   EVAL: States this episode began during a prolonged hospitalization in March, reduced mobility. She does report some off and on pain with work activities previously. States she got XR of low back, states she was told she has some arthritis in her hip and spine. Reports symptoms were irritated with a near fall last week. Reports ~10 min standing tolerance, 30 min sitting tolerance. Walking limited by exertional SOB but back will bother her as well. No bowel/bladder changes, no N/T, no saddle anesthesia. No  fevers/chills.  PERTINENT HISTORY:  asthma, CHF, GERD, HTN, aortic insufficiency, headaches, hx SAH, OSA Pt reports she will still get R sided headaches/tension, stable  Denies chest pain, will get some exertional SOB at times Scheduled for ankle surgery Dec 17th   PAIN:  Are you having pain: 4/10 R lateral/posterior hip, up to 8-9/10 with increased work Monday   Per eval: Location/description: R low back, BIL hips; will occasionally refer to posterolateral R thigh  Best-worst over past week: 6-8/10  - aggravating factors: walking, bending forward, coughing (induces a pulling feeling in back), STS, lifting/carrying - Easing factors: heating pads, icy hot, leaning fwd  PRECAUTIONS: None  RED FLAGS: None   WEIGHT BEARING RESTRICTIONS: No  FALLS:  Has patient fallen in last 6 months? No - does report a near fall last week in which she irritated the R side of her back/hip  OCCUPATION: youth worker  PLOF: Independent  PATIENT GOALS:  be more mobile, feel less limited, be able to do more   NEXT MD VISIT: TBD  OBJECTIVE:  Note: Objective measures were completed at Evaluation unless otherwise noted.  DIAGNOSTIC FINDINGS:  No recent spine imaging in chart - pt reports XR showing arthritis   PATIENT SURVEYS:  ODI: 21/50; 42%   COGNITION: Overall cognitive status: Within functional limits for tasks assessed     SENSATION: Denies sensory complaints - mild tingling about lateral knee on R side, otherwise LT intact throughout  LUMBAR ROM:   AROM eval 09/22/24  Flexion Mid shin relieving   Extension 75% *    Right lateral flexion 25% *   Left lateral flexion    Right rotation 25% s 25% s  Left rotation 25% s * 50% s    (Blank rows = not tested) (Key: WFL = within functional limits not formally assessed, * = concordant pain, s = stiffness/stretching sensation, NT = not tested) Comment:    LOWER EXTREMITY MMT:    MMT Right eval Left eval  Hip flexion 4- 5   Hip abduction (modified sitting) 5 5  Hip internal rotation 4 4+  Hip external rotation 4 4+  Knee flexion 4+ 4+  Knee extension 4+ 4+  Ankle dorsiflexion     (Blank rows = not tested) (Key: WFL = within functional limits not formally assessed, * = concordant pain, s = stiffness/stretching sensation, NT = not tested)  Comments:     FUNCTIONAL TESTS:  SLS:  R: 3 sec  L: 1 sec   GAIT: Distance walked: within clinic Assistive device utilized: None Level of assistance: Complete Independence Comments: reduced truncal rotation BIL, mildly  widened BOS   TREATMENT:   [facility] Adult PT Treatment:                                                DATE: 09/29/24 Therapeutic Exercise: LTR x12 BIL  Green band hooklying hip abd + 5 sec iso hold 2x8 Mini bridge iso on swiss ball (no hip clearance) x12 cues for breath control  Slow marches x8 BIL weaning UE support (emphasis on pelvic positioning and glute med endurance in stance)  HEP discussion/education  Manual Therapy: Teaching self-release w/ tennis ball at wall, R glutes; trigger point x6 lateral and posterior hip with 45-60sec hold. Most concordant along superior glute max. Discussed indications and modification/cessation based on symptom response     [facility] Adult PT Treatment:                                                DATE: 09/22/24 Therapeutic Exercise: LTR x10 BIL cues for breath control and comfortable ROM DKTC w/ swiss ball, x15 cues for breath control and comfortable ROM  Seated pelvic tilts on mat x10  HEP update + education/handout  Neuromuscular re-ed: LTR w/ swiss ball x10 cues for motor control, pacing, and breath control  Seated pelvic tilts on dynadisc 2x10; cues for reduced thoracolumbar compensations, breath control, motor control  Glute med press into ball at wall x8 BIL cues for breath control and comfortable force output Inc time for education/discussion re: pacing, reducing muscle guarding compensations,  breath control                                                                                                                             PATIENT EDUCATION:  Education details: rationale for interventions, HEP  Person educated: Patient Education method: Explanation, Demonstration, Tactile cues, Verbal cues Education comprehension: verbalized understanding, returned demonstration, verbal cues required, tactile cues required, and needs further education     HOME EXERCISE PROGRAM: Access Code: [email or web address] URL: [email or web address] Date: 09/22/2024 Prepared by: Alm Jenny  Exercises - Seated Flexion Stretch  - 2-3 x daily - 1 sets - 8 reps - Standing March with Counter Support  - 2-3 x daily - 1 sets - 8 reps - Supine Lower Trunk Rotation with PLB  - 2 x daily - 7 x weekly - 2 sets - 20 reps - Hooklying Single Knee to Chest Stretch with Towel  - 2 x daily - 7 x weekly - 1 sets - 3-5 reps - 20 sec hold - Seated Transversus Abdominis Bracing  - 2 x daily - 7 x weekly - 1 sets - 10 reps - 5 sec hold - Supine Diaphragmatic Breathing  - 1 x daily -  7 x weekly - 1 sets - 10 reps - Seated Diaphragmatic Breathing  - 1 x daily - 7 x weekly - 1 sets - 10 reps - Seated Pelvic Tilt  - 1 x daily - 7 x weekly - 2 sets - 10 reps  ASSESSMENT:  CLINICAL IMPRESSION: 09/29/2024: Pt arrives w/ report of increased pain after work activities yesterday (4/10). Given this we trial self-release with tennis ball at wall - does describe some relief during but no change in symptoms afterwards. She does well with exercise progressions, noted reduction in R hip tension w/ hooklying abd isometrics. No adverse events, reports modest improvement in pain on departure (2-3/10). Recommend continuing along current POC in order to address relevant deficits and improve functional tolerance. Pt departs today's session in no acute distress, all voiced questions/concerns addressed appropriately from PT perspective.      EVAL: Patient is a pleasant 58 y.o. woman who was seen today for physical therapy evaluation and treatment for back pain ongoing over past few months. Red flag questioning reassuring today. She endorses difficulty with heavier activities and prolonged activity, with standing and sitting tolerance limited. On exam she demonstrates concordant limitations in lumbar mobility, hip strength, and LE stability (SLS). Mild symptom irritability but remains around baseline as session goes on, no adverse events. Recommend PT to address aforementioned deficits with aim of maximizing tolerance to daily/work tasks. Of note she is scheduled for LE surgery in mid-December; discussed w/ pt at that point we would plan to discharge from PT given change in medical status, would need new referral to resume PT (for either LE or back). Pt departs today's session in no acute distress, all voiced questions/concerns addressed appropriately from PT perspective.      OBJECTIVE IMPAIRMENTS: decreased activity tolerance, decreased balance, decreased endurance, decreased mobility, difficulty walking, decreased ROM, decreased strength, impaired perceived functional ability, improper body mechanics, postural dysfunction, and pain.   ACTIVITY LIMITATIONS: carrying, lifting, bending, sitting, standing, squatting, sleeping, stairs, transfers, and locomotion level  PARTICIPATION LIMITATIONS: meal prep, cleaning, laundry, shopping, community activity, and occupation  PERSONAL FACTORS: Time since onset of injury/illness/exacerbation and 3+ comorbidities: asthma, CHF, GERD, HTN, aortic insufficiency, headaches, hx SAH, OSA are also affecting patient's functional outcome.   REHAB POTENTIAL: Fair given comorbidities  CLINICAL DECISION MAKING: Stable/uncomplicated  EVALUATION COMPLEXITY: Low   GOALS:  SHORT/LONG TERM GOALS: Target date: 10/13/2024  Pt will demonstrate appropriate understanding and performance of initially prescribed  HEP in order to facilitate improved independence with management of symptoms.  Baseline: HEP established  Goal status: INITIAL   2. Pt will report at least 25% improvement in overall pain levels over past week in order to facilitate improved tolerance to typical daily activities.   Baseline: 6-8/10  Goal status: INITIAL    3. Pt will improve at least 15% on ODI in order to demonstrate improved perception of functional status due to symptoms.  Baseline: 41% Goal status: INITIAL  4. Pt will perform SLS on RLE for at least 10 sec in order to demonstrate improved postural stability.   Baseline: 1 sec LLE, 3 sec RLE   Goal status: INITIAL   5. Pt will report ability to stand for at least 25 min with less </=2/10 increase in pain on NPS in order to facilitate improved tolerance to work tasks.  Baseline: pain with standing 10 min  Goal status: INITIAL   PLAN:  PT FREQUENCY: 2x/week  PT DURATION: 4 weeks  PLANNED INTERVENTIONS: 02835- PT Re-evaluation, 97750-  Physical Performance Testing, 97110-Therapeutic exercises, 97530- Therapeutic activity, [id number]- Neuromuscular re-education, 97535- Self Care, [id number]- Manual therapy, [phone]- Gait training, [phone] (1-2 muscles), [phone] (3+ muscles)- Dry Needling, Patient/Family education, Balance training, Stair training, Taping, Joint mobilization, Spinal mobilization, Cryotherapy, and Moist heat.  PLAN FOR NEXT SESSION:  Work on ROM/strength exercises as appropriate with emphasis on core strength/stability, hip strength, balance. Symptom modification strategies as indicated/appropriate. Mindful of cardiac/neuro hx.    Alm DELENA Jenny PT, DPT 09/29/2024 9:37 AM

## 2024-09-29 ENCOUNTER — Ambulatory Visit: Admitting: Physical Therapy

## 2024-09-29 ENCOUNTER — Encounter: Payer: Self-pay | Admitting: Physical Therapy

## 2024-09-29 DIAGNOSIS — M25551 Pain in right hip: Secondary | ICD-10-CM | POA: Diagnosis present

## 2024-09-29 DIAGNOSIS — M25552 Pain in left hip: Secondary | ICD-10-CM | POA: Diagnosis present

## 2024-09-29 DIAGNOSIS — M6281 Muscle weakness (generalized): Secondary | ICD-10-CM | POA: Insufficient documentation

## 2024-09-29 DIAGNOSIS — M5459 Other low back pain: Secondary | ICD-10-CM | POA: Diagnosis present

## 2024-09-30 ENCOUNTER — Telehealth: Payer: Self-pay | Admitting: Allergy

## 2024-09-30 NOTE — Telephone Encounter (Signed)
 Called to schedule [facility]  reapproval. No voicemail set up, could not leave message.

## 2024-10-01 ENCOUNTER — Ambulatory Visit

## 2024-10-01 DIAGNOSIS — M25551 Pain in right hip: Secondary | ICD-10-CM

## 2024-10-01 DIAGNOSIS — M5459 Other low back pain: Secondary | ICD-10-CM

## 2024-10-01 DIAGNOSIS — M6281 Muscle weakness (generalized): Secondary | ICD-10-CM

## 2024-10-01 NOTE — Therapy (Addendum)
 OUTPATIENT PHYSICAL THERAPY TREATMENT   Patient Name: Jessica Snow MRN: [id number] DOB:Jul 22, 1966, 58 y.o., female Today's Date: 10/01/2024  END OF SESSION:  PT End of Session - 10/01/24 0756     Visit Number 5    Number of Visits 9    Date for Recertification  10/13/24    Authorization Type aetna    PT Start Time 0759    PT Stop Time 0847    PT Time Calculation (min) 48 min    Activity Tolerance Patient tolerated treatment well    Behavior During Therapy [facility] for tasks assessed/performed         Past Medical History:  Diagnosis Date   Ascending aorta dilatation    Asthma    Chronic diastolic CHF (congestive heart failure) (HCC)    Chronic rhinitis    Cough    Dilation of pulmonary artery (HCC)    Edema, lower extremity    GERD (gastroesophageal reflux disease)    HTN (hypertension)    Mild aortic insufficiency    Morbid obesity (HCC)    Murmur    Pneumonia    Pre-diabetes    Past Surgical History:  Procedure Laterality Date   ANEURYSM COILING  01/24/2023   ANKLE RECONSTRUCTION     BREAST BIOPSY Right 08/25/2024   MM RT BREAST BX W LOC DEV 1ST LESION IMAGE BX SPEC STEREO GUIDE 08/25/2024 [facility]   TONSILLECTOMY     TOTAL VAGINAL HYSTERECTOMY  2007   Patient Active Problem List   Diagnosis Date Noted   Bee allergy  status 02/23/2024   Chronic nonintractable headache 02/23/2024   Encounter for annual health examination 02/19/2024   Tension headache 06/07/2023   Other sequelae following nontraumatic subarachnoid hemorrhage 02/19/2023   Pneumonia, organism unspecified(486) 02/19/2023   Severe persistent asthma (HCC) 02/19/2023   OSA (obstructive sleep apnea) 02/19/2023   Dyslipidemia 02/19/2023   Pneumonia of both lower lobes due to infectious organism 01/30/2023   SAH (subarachnoid hemorrhage) (HCC) 01/23/2023   Tinea pedis of both feet 06/23/2022   Hypertensive heart disease with chronic diastolic congestive heart failure (HCC) 06/17/2022    Pure hypercholesterolemia 06/17/2022   Dilation of pulmonary artery (HCC) 08/14/2021   Type 2 diabetes mellitus with other specified complication, without long-term current use of insulin (HCC) 02/01/2020   Snoring 09/29/2019   Diastolic heart failure (HCC) 08/31/2018   Chronic rhinitis 07/16/2018   Gastroesophageal reflux disease 04/10/2018   Cough variant asthma vs uacs  04/02/2018   Left tibial fracture 06/07/2016   Post-tonsillectomy hemorrhage 04/26/2016   Class 3 severe obesity due to excess calories with serious comorbidity and body mass index (BMI) of 45.0 to 49.9 in adult [facility]) 01/20/2012   Murmur 01/20/2012   Essential hypertension     PCP: Jarold Medici, MD  REFERRING PROVIDER: Curtis Debby PARAS, MD  REFERRING DIAG: M54.50 (ICD-10-CM) - Low back pain  Rationale for Evaluation and Treatment: Rehabilitation  THERAPY DIAG:  Other low back pain  Pain of both hip joints  Muscle weakness (generalized)  ONSET DATE: March 2025  SUBJECTIVE:  SUBJECTIVE STATEMENT: Patient reports she continues to have swelling and tenderness on posterior Rt hip; states she has radiating symptoms with light touch. Patient states she continues to have occasional zings down side of right thigh.    EVAL: States this episode began during a prolonged hospitalization in March, reduced mobility. She does report some off and on pain with work activities previously. States she got XR of low back, states she was told she has some arthritis in her hip and spine. Reports symptoms were irritated with a near fall last week. Reports ~10 min standing tolerance, 30 min sitting tolerance. Walking limited by exertional SOB but back will bother her as well. No bowel/bladder changes, no N/T, no saddle anesthesia. No  fevers/chills.  PERTINENT HISTORY:  asthma, CHF, GERD, HTN, aortic insufficiency, headaches, hx SAH, OSA Pt reports she will still get R sided headaches/tension, stable  Denies chest pain, will get some exertional SOB at times Scheduled for ankle surgery Dec 17th   PAIN:  Are you having pain: 3/10 R lateral/posterior hip; 8-9/10 at worst  Per eval: Location/description: R low back, BIL hips; will occasionally refer to posterolateral R thigh  Best-worst over past week: 6-8/10  - aggravating factors: walking, bending forward, coughing (induces a pulling feeling in back), STS, lifting/carrying - Easing factors: heating pads, icy hot, leaning fwd  PRECAUTIONS: None  RED FLAGS: None   WEIGHT BEARING RESTRICTIONS: No  FALLS:  Has patient fallen in last 6 months? No - does report a near fall last week in which she irritated the R side of her back/hip  OCCUPATION: youth worker  PLOF: Independent  PATIENT GOALS:  be more mobile, feel less limited, be able to do more   NEXT MD VISIT: TBD  OBJECTIVE:  Note: Objective measures were completed at Evaluation unless otherwise noted.  DIAGNOSTIC FINDINGS:  No recent spine imaging in chart - pt reports XR showing arthritis   PATIENT SURVEYS:  ODI: 21/50; 42%   COGNITION: Overall cognitive status: Within functional limits for tasks assessed     SENSATION: Denies sensory complaints - mild tingling about lateral knee on R side, otherwise LT intact throughout  LUMBAR ROM:   AROM eval 09/22/24  Flexion Mid shin relieving   Extension 75% *    Right lateral flexion 25% *   Left lateral flexion    Right rotation 25% s 25% s  Left rotation 25% s * 50% s    (Blank rows = not tested) (Key: WFL = within functional limits not formally assessed, * = concordant pain, s = stiffness/stretching sensation, NT = not tested) Comment:    LOWER EXTREMITY MMT:    MMT Right eval Left eval  Hip flexion 4- 5  Hip abduction (modified  sitting) 5 5  Hip internal rotation 4 4+  Hip external rotation 4 4+  Knee flexion 4+ 4+  Knee extension 4+ 4+  Ankle dorsiflexion     (Blank rows = not tested) (Key: WFL = within functional limits not formally assessed, * = concordant pain, s = stiffness/stretching sensation, NT = not tested)  Comments:     FUNCTIONAL TESTS:  SLS:  R: 3 sec  L: 1 sec   GAIT: Distance walked: within clinic Assistive device utilized: None Level of assistance: Complete Independence Comments: reduced truncal rotation BIL, mildly widened BOS    TREATMENT:  [facility] Adult PT Treatment:  DATE: 10/01/2024 Manual Therapy: STM/TPR obturators in side lying Iliacus release (Rt>Lt) Neuromuscular re-ed: Hooklying:  Hip add isometric ball squeeze + TA activation 10 x 5 sec Bent knee fall outs + red TB x10 (bil) Clamshells + red TB 10 x5 sec Therapeutic Activity: Self-massage Rt obturators with tennis ball Myofascial release psoas with 4 ball (prone)    [facility] Adult PT Treatment:                                                DATE: 09/29/24 Therapeutic Exercise: LTR x12 BIL  Green band hooklying hip abd + 5 sec iso hold 2x8 Mini bridge iso on swiss ball (no hip clearance) x12 cues for breath control  Slow marches x8 BIL weaning UE support (emphasis on pelvic positioning and glute med endurance in stance)  HEP discussion/education  Manual Therapy: Teaching self-release w/ tennis ball at wall, R glutes; trigger point x6 lateral and posterior hip with 45-60sec hold. Most concordant along superior glute max. Discussed indications and modification/cessation based on symptom response    [facility] Adult PT Treatment:                                                DATE: 09/22/24 Therapeutic Exercise: LTR x10 BIL cues for breath control and comfortable ROM DKTC w/ swiss ball, x15 cues for breath control and comfortable ROM  Seated pelvic tilts on mat x10  HEP update  + education/handout  Neuromuscular re-ed: LTR w/ swiss ball x10 cues for motor control, pacing, and breath control  Seated pelvic tilts on dynadisc 2x10; cues for reduced thoracolumbar compensations, breath control, motor control  Glute med press into ball at wall x8 BIL cues for breath control and comfortable force output Inc time for education/discussion re: pacing, reducing muscle guarding compensations, breath control                                                                                                                    PATIENT EDUCATION:  Education details: Updated HEP  Person educated: Patient Education method: Explanation, Demonstration, Tactile cues, Verbal cues Education comprehension: verbalized understanding, returned demonstration, verbal cues required, tactile cues required, and needs further education     HOME EXERCISE PROGRAM: Access Code: [email or web address] URL: [email or web address] Date: 10/01/2024 Prepared by: Lamarr Price  Program Notes Obturator massage with tennis ball (sitting): place tennis ball between sits bone and tailbone  Exercises - Seated Flexion Stretch  - 2-3 x daily - 1 sets - 8 reps - Standing March with Counter Support  - 2-3 x daily - 1 sets - 8 reps - Supine Lower Trunk Rotation with PLB  - 2 x daily - 7 x weekly - 2 sets - 20 reps -  Hooklying Single Knee to Chest Stretch with Towel  - 2 x daily - 7 x weekly - 1 sets - 3-5 reps - 20 sec hold - Seated Transversus Abdominis Bracing  - 2 x daily - 7 x weekly - 1 sets - 10 reps - 5 sec hold - Supine Diaphragmatic Breathing  - 1 x daily - 7 x weekly - 1 sets - 10 reps - Seated Diaphragmatic Breathing  - 1 x daily - 7 x weekly - 1 sets - 10 reps - Seated Pelvic Tilt  - 1 x daily - 7 x weekly - 2 sets - 10 reps - Psoas Mobilization with Small Ball  - 1 x daily - 7 x weekly - 1 sets - 1 reps - Supine Hip Adduction Isometric with Ball  - 1 x daily - 7 x weekly - 1 sets - 10 reps - 5 sec  hold - Hooklying Clamshell with Resistance  - 1 x daily - 7 x weekly - 3 sets - 10 reps - 5 sec hold - Hooklying Single Leg Bent Knee Fallouts with Resistance  - 1 x daily - 7 x weekly - 1 sets - 10 reps  ASSESSMENT:  CLINICAL IMPRESSION:  Noted tightness/tension with Rt pelvic floor musculature; good response with iliacus release and soft tissue mobilization to obturators. Patient instructed on self-massage of psoas (Rt>Lt) to address myofascial tension related to hip pain. Progressed hip isometric strengthening exercises with resisted hip abduction/ER to challenge pelvic stabilization. Will follow-up with patient at next visit response with self-myofascial massage and new HEP.   EVAL: Patient is a pleasant 58 y.o. woman who was seen today for physical therapy evaluation and treatment for back pain ongoing over past few months. Red flag questioning reassuring today. She endorses difficulty with heavier activities and prolonged activity, with standing and sitting tolerance limited. On exam she demonstrates concordant limitations in lumbar mobility, hip strength, and LE stability (SLS). Mild symptom irritability but remains around baseline as session goes on, no adverse events. Recommend PT to address aforementioned deficits with aim of maximizing tolerance to daily/work tasks. Of note she is scheduled for LE surgery in mid-December; discussed w/ pt at that point we would plan to discharge from PT given change in medical status, would need new referral to resume PT (for either LE or back). Pt departs today's session in no acute distress, all voiced questions/concerns addressed appropriately from PT perspective.      OBJECTIVE IMPAIRMENTS: decreased activity tolerance, decreased balance, decreased endurance, decreased mobility, difficulty walking, decreased ROM, decreased strength, impaired perceived functional ability, improper body mechanics, postural dysfunction, and pain.   ACTIVITY LIMITATIONS:  carrying, lifting, bending, sitting, standing, squatting, sleeping, stairs, transfers, and locomotion level  PARTICIPATION LIMITATIONS: meal prep, cleaning, laundry, shopping, community activity, and occupation  PERSONAL FACTORS: Time since onset of injury/illness/exacerbation and 3+ comorbidities: asthma, CHF, GERD, HTN, aortic insufficiency, headaches, hx SAH, OSA are also affecting patient's functional outcome.   REHAB POTENTIAL: Fair given comorbidities  CLINICAL DECISION MAKING: Stable/uncomplicated  EVALUATION COMPLEXITY: Low   GOALS:  SHORT/LONG TERM GOALS: Target date: 10/13/2024  Pt will demonstrate appropriate understanding and performance of initially prescribed HEP in order to facilitate improved independence with management of symptoms.  Baseline: HEP established  Goal status: INITIAL   2. Pt will report at least 25% improvement in overall pain levels over past week in order to facilitate improved tolerance to typical daily activities.   Baseline: 6-8/10  Goal status: INITIAL  3. Pt will improve at least 15% on ODI in order to demonstrate improved perception of functional status due to symptoms.  Baseline: 41% Goal status: INITIAL  4. Pt will perform SLS on RLE for at least 10 sec in order to demonstrate improved postural stability.   Baseline: 1 sec LLE, 3 sec RLE   Goal status: INITIAL   5. Pt will report ability to stand for at least 25 min with less </=2/10 increase in pain on NPS in order to facilitate improved tolerance to work tasks.  Baseline: pain with standing 10 min  Goal status: INITIAL   PLAN:  PT FREQUENCY: 2x/week  PT DURATION: 4 weeks  PLANNED INTERVENTIONS: 97164- PT Re-evaluation, 97750- Physical Performance Testing, 97110-Therapeutic exercises, 97530- Therapeutic activity, [id number]- Neuromuscular re-education, 97535- Self Care, [id number]- Manual therapy, 97116- Gait training, [id number] (1-2 muscles), 20561 (3+ muscles)- Dry Needling, Patient/Family  education, Balance training, Stair training, Taping, Joint mobilization, Spinal mobilization, Cryotherapy, and Moist heat.  PLAN FOR NEXT SESSION:  Response to self-massage? Pelvic floor relaxation/strengthening; hip strengthening. Work on [employer] exercises as appropriate with emphasis on core strength/stability, hip strength, balance. Symptom modification strategies as indicated/appropriate. Mindful of cardiac/neuro hx.    Lamarr Price, PTA 10/01/2024 8:52 AM

## 2024-10-04 ENCOUNTER — Ambulatory Visit

## 2024-10-04 DIAGNOSIS — M5459 Other low back pain: Secondary | ICD-10-CM | POA: Diagnosis not present

## 2024-10-04 DIAGNOSIS — M6281 Muscle weakness (generalized): Secondary | ICD-10-CM

## 2024-10-04 DIAGNOSIS — M25551 Pain in right hip: Secondary | ICD-10-CM

## 2024-10-04 NOTE — Therapy (Signed)
 OUTPATIENT PHYSICAL THERAPY TREATMENT   Patient Name: Jessica Snow MRN: [id number] DOB:12-23-1965, 58 y.o., female Today's Date: 10/04/2024  END OF SESSION:  PT End of Session - 10/04/24 1104     Visit Number 6    Number of Visits 9    Date for Recertification  10/13/24    Authorization Type aetna    PT Start Time 1104    PT Stop Time 1150    PT Time Calculation (min) 46 min    Activity Tolerance Patient tolerated treatment well    Behavior During Therapy WFL for tasks assessed/performed         Past Medical History:  Diagnosis Date   Ascending aorta dilatation    Asthma    Chronic diastolic CHF (congestive heart failure) (HCC)    Chronic rhinitis    Cough    Dilation of pulmonary artery (HCC)    Edema, lower extremity    GERD (gastroesophageal reflux disease)    HTN (hypertension)    Mild aortic insufficiency    Morbid obesity (HCC)    Murmur    Pneumonia    Pre-diabetes    Past Surgical History:  Procedure Laterality Date   ANEURYSM COILING  01/24/2023   ANKLE RECONSTRUCTION     BREAST BIOPSY Right 08/25/2024   MM RT BREAST BX W LOC DEV 1ST LESION IMAGE BX SPEC STEREO GUIDE 08/25/2024 [facility]   TONSILLECTOMY     TOTAL VAGINAL HYSTERECTOMY  2007   Patient Active Problem List   Diagnosis Date Noted   Bee allergy  status 02/23/2024   Chronic nonintractable headache 02/23/2024   Encounter for annual health examination 02/19/2024   Tension headache 06/07/2023   Other sequelae following nontraumatic subarachnoid hemorrhage 02/19/2023   Pneumonia, organism unspecified(486) 02/19/2023   Severe persistent asthma (HCC) 02/19/2023   OSA (obstructive sleep apnea) 02/19/2023   Dyslipidemia 02/19/2023   Pneumonia of both lower lobes due to infectious organism 01/30/2023   SAH (subarachnoid hemorrhage) (HCC) 01/23/2023   Tinea pedis of both feet 06/23/2022   Hypertensive heart disease with chronic diastolic congestive heart failure (HCC) 06/17/2022    Pure hypercholesterolemia 06/17/2022   Dilation of pulmonary artery (HCC) 08/14/2021   Type 2 diabetes mellitus with other specified complication, without long-term current use of insulin (HCC) 02/01/2020   Snoring 09/29/2019   Diastolic heart failure (HCC) 08/31/2018   Chronic rhinitis 07/16/2018   Gastroesophageal reflux disease 04/10/2018   Cough variant asthma vs uacs  04/02/2018   Left tibial fracture 06/07/2016   Post-tonsillectomy hemorrhage 04/26/2016   Class 3 severe obesity due to excess calories with serious comorbidity and body mass index (BMI) of 45.0 to 49.9 in adult [facility]) 01/20/2012   Murmur 01/20/2012   Essential hypertension     PCP: Jarold Medici, MD  REFERRING PROVIDER: Curtis Debby PARAS, MD  REFERRING DIAG: M54.50 (ICD-10-CM) - Low back pain  Rationale for Evaluation and Treatment: Rehabilitation  THERAPY DIAG:  Other low back pain  Pain of both hip joints  Muscle weakness (generalized)  ONSET DATE: March 2025  SUBJECTIVE:  SUBJECTIVE STATEMENT: Patient reports she continues to be swollen at Rt posterior hip, states she has been alternating ice and heat. Patient states she was sore after last visit like I really worked out but no pain. Patient states she has minimal pain, compliant is that I can feel it is there with the weather.    EVAL: States this episode began during a prolonged hospitalization in March, reduced mobility. She does report some off and on pain with work activities previously. States she got XR of low back, states she was told she has some arthritis in her hip and spine. Reports symptoms were irritated with a near fall last week. Reports ~10 min standing tolerance, 30 min sitting tolerance. Walking limited by exertional SOB but back will bother her  as well. No bowel/bladder changes, no N/T, no saddle anesthesia. No fevers/chills.  PERTINENT HISTORY:  asthma, CHF, GERD, HTN, aortic insufficiency, headaches, hx SAH, OSA Pt reports she will still get R sided headaches/tension, stable  Denies chest pain, will get some exertional SOB at times Scheduled for ankle surgery Dec 17th   PAIN:  Are you having pain: 1/10 R lateral/posterior hip; 8-9/10 at worst  Per eval: Location/description: R low back, BIL hips; will occasionally refer to posterolateral R thigh  Best-worst over past week: 6-8/10  - aggravating factors: walking, bending forward, coughing (induces a pulling feeling in back), STS, lifting/carrying - Easing factors: heating pads, icy hot, leaning fwd  PRECAUTIONS: None  RED FLAGS: None   WEIGHT BEARING RESTRICTIONS: No  FALLS:  Has patient fallen in last 6 months? No - does report a near fall last week in which she irritated the R side of her back/hip  OCCUPATION: youth worker  PLOF: Independent  PATIENT GOALS:  be more mobile, feel less limited, be able to do more   NEXT MD VISIT: TBD  OBJECTIVE:  Note: Objective measures were completed at Evaluation unless otherwise noted.  DIAGNOSTIC FINDINGS:  No recent spine imaging in chart - pt reports XR showing arthritis   PATIENT SURVEYS:  ODI: 21/50; 42%   COGNITION: Overall cognitive status: Within functional limits for tasks assessed     SENSATION: Denies sensory complaints - mild tingling about lateral knee on R side, otherwise LT intact throughout  LUMBAR ROM:   AROM eval 09/22/24  Flexion Mid shin relieving   Extension 75% *    Right lateral flexion 25% *   Left lateral flexion    Right rotation 25% s 25% s  Left rotation 25% s * 50% s    (Blank rows = not tested) (Key: WFL = within functional limits not formally assessed, * = concordant pain, s = stiffness/stretching sensation, NT = not tested) Comment:    LOWER EXTREMITY MMT:    MMT  Right eval Left eval  Hip flexion 4- 5  Hip abduction (modified sitting) 5 5  Hip internal rotation 4 4+  Hip external rotation 4 4+  Knee flexion 4+ 4+  Knee extension 4+ 4+  Ankle dorsiflexion     (Blank rows = not tested) (Key: WFL = within functional limits not formally assessed, * = concordant pain, s = stiffness/stretching sensation, NT = not tested)  Comments:     FUNCTIONAL TESTS:  SLS:  R: 3 sec  L: 1 sec   GAIT: Distance walked: within clinic Assistive device utilized: None Level of assistance: Complete Independence Comments: reduced truncal rotation BIL, mildly widened BOS    TREATMENT:  [facility] Adult PT Treatment:  DATE: 10/04/2024 Neuromuscular re-ed: Hooklying clamshell + green TB 10 x 5 sec Posterior pelvic tilts in hooklying on deflated coregeous ball Seated on red PB: Posterior pelvic tilts Small range marching Seated dead bug (marching) Standing hip abd + tactile cues IR assist Therapeutic Activity: Prone Lt knee flexion x 8 Prone prop on elbows x 1 min Prone press up 2x10 --> pain decreased on first set, then increased on 2nd set (discontinued)   [facility] Adult PT Treatment:                                                DATE: 10/01/2024 Manual Therapy: STM/TPR obturators in side lying Iliacus release (Rt>Lt) Neuromuscular re-ed: Hooklying:  Hip add isometric ball squeeze + TA activation 10 x 5 sec Bent knee fall outs + red TB x10 (bil) Clamshells + red TB 10 x5 sec Therapeutic Activity: Self-massage Rt obturators with tennis ball Myofascial release psoas with 4 ball (prone)    [facility] Adult PT Treatment:                                                DATE: 09/29/24 Therapeutic Exercise: LTR x12 BIL  Green band hooklying hip abd + 5 sec iso hold 2x8 Mini bridge iso on swiss ball (no hip clearance) x12 cues for breath control  Slow marches x8 BIL weaning UE support (emphasis on pelvic positioning and  glute med endurance in stance)  HEP discussion/education  Manual Therapy: Teaching self-release w/ tennis ball at wall, R glutes; trigger point x6 lateral and posterior hip with 45-60sec hold. Most concordant along superior glute max. Discussed indications and modification/cessation based on symptom response                                                                                                                    PATIENT EDUCATION:  Education details: Updated HEP  Person educated: Patient Education method: Explanation, Demonstration, Tactile cues, Verbal cues Education comprehension: verbalized understanding, returned demonstration, verbal cues required, tactile cues required, and needs further education     HOME EXERCISE PROGRAM: Access Code: [email or web address] URL: [email or web address] Date: 10/04/2024 Prepared by: Lamarr Price  Program Notes Obturator massage with tennis ball (sitting): place tennis ball between sits bone and tailbone  Exercises - Seated Flexion Stretch  - 2-3 x daily - 1 sets - 8 reps - Standing March with Counter Support  - 2-3 x daily - 1 sets - 8 reps - Supine Lower Trunk Rotation with PLB  - 2 x daily - 7 x weekly - 2 sets - 20 reps - Hooklying Single Knee to Chest Stretch with Towel  - 2 x daily - 7 x weekly - 1 sets - 3-5 reps - 20 sec  hold - Seated Transversus Abdominis Bracing  - 2 x daily - 7 x weekly - 1 sets - 10 reps - 5 sec hold - Supine Diaphragmatic Breathing  - 1 x daily - 7 x weekly - 1 sets - 10 reps - Seated Diaphragmatic Breathing  - 1 x daily - 7 x weekly - 1 sets - 10 reps - Seated Pelvic Tilt  - 1 x daily - 7 x weekly - 2 sets - 10 reps - Psoas Mobilization with Small Ball  - 1 x daily - 7 x weekly - 1 sets - 1 reps - Supine Hip Adduction Isometric with Ball  - 1 x daily - 7 x weekly - 1 sets - 10 reps - 5 sec hold - Hooklying Clamshell with Resistance  - 1 x daily - 7 x weekly - 3 sets - 10 reps - 5 sec hold - Hooklying  Single Leg Bent Knee Fallouts with Resistance  - 1 x daily - 7 x weekly - 1 sets - 10 reps - Seated Isometric Hip Abduction with Belt  - 1 x daily - 7 x weekly - 1 sets - 10 reps - 5 sec hold  ASSESSMENT:  CLINICAL IMPRESSION:  Patient challenged with posterior pelvic tilt mechanics; verbal and tactile cues improved body mechanics, as well as modifying exercise from supine to seated on physioball. Hip external rotation compensation demonstrated with standing hip abduction; tactile assist provided to maintain hip neutral alignment with side leg raises. Added seated hip abduction isometric to HEP to address hip weakness. Patient continues to present with swelling at Rt posterior hip, however no significant increase in pain.   EVAL: Patient is a pleasant 58 y.o. woman who was seen today for physical therapy evaluation and treatment for back pain ongoing over past few months. Red flag questioning reassuring today. She endorses difficulty with heavier activities and prolonged activity, with standing and sitting tolerance limited. On exam she demonstrates concordant limitations in lumbar mobility, hip strength, and LE stability (SLS). Mild symptom irritability but remains around baseline as session goes on, no adverse events. Recommend PT to address aforementioned deficits with aim of maximizing tolerance to daily/work tasks. Of note she is scheduled for LE surgery in mid-December; discussed w/ pt at that point we would plan to discharge from PT given change in medical status, would need new referral to resume PT (for either LE or back). Pt departs today's session in no acute distress, all voiced questions/concerns addressed appropriately from PT perspective.      OBJECTIVE IMPAIRMENTS: decreased activity tolerance, decreased balance, decreased endurance, decreased mobility, difficulty walking, decreased ROM, decreased strength, impaired perceived functional ability, improper body mechanics, postural  dysfunction, and pain.   ACTIVITY LIMITATIONS: carrying, lifting, bending, sitting, standing, squatting, sleeping, stairs, transfers, and locomotion level  PARTICIPATION LIMITATIONS: meal prep, cleaning, laundry, shopping, community activity, and occupation  PERSONAL FACTORS: Time since onset of injury/illness/exacerbation and 3+ comorbidities: asthma, CHF, GERD, HTN, aortic insufficiency, headaches, hx SAH, OSA are also affecting patient's functional outcome.   REHAB POTENTIAL: Fair given comorbidities  CLINICAL DECISION MAKING: Stable/uncomplicated  EVALUATION COMPLEXITY: Low   GOALS:  SHORT/LONG TERM GOALS: Target date: 10/13/2024  Pt will demonstrate appropriate understanding and performance of initially prescribed HEP in order to facilitate improved independence with management of symptoms.  Baseline: HEP established  Goal status: INITIAL   2. Pt will report at least 25% improvement in overall pain levels over past week in order to facilitate improved tolerance to typical  daily activities.   Baseline: 6-8/10  Goal status: INITIAL    3. Pt will improve at least 15% on ODI in order to demonstrate improved perception of functional status due to symptoms.  Baseline: 41% Goal status: INITIAL  4. Pt will perform SLS on RLE for at least 10 sec in order to demonstrate improved postural stability.   Baseline: 1 sec LLE, 3 sec RLE   Goal status: INITIAL   5. Pt will report ability to stand for at least 25 min with less </=2/10 increase in pain on NPS in order to facilitate improved tolerance to work tasks.  Baseline: pain with standing 10 min  Goal status: INITIAL   PLAN:  PT FREQUENCY: 2x/week  PT DURATION: 4 weeks  PLANNED INTERVENTIONS: 97164- PT Re-evaluation, 97750- Physical Performance Testing, 97110-Therapeutic exercises, 97530- Therapeutic activity, [id number]- Neuromuscular re-education, 97535- Self Care, [id number]- Manual therapy, 97116- Gait training, [phone] (1-2 muscles),  [id number] (3+ muscles)- Dry Needling, Patient/Family education, Balance training, Stair training, Taping, Joint mobilization, Spinal mobilization, Cryotherapy, and Moist heat.  PLAN FOR NEXT SESSION: Pelvic floor relaxation/strengthening; hip strengthening. Work on [employer] exercises as appropriate with emphasis on core strength/stability, hip strength, balance. Symptom modification strategies as indicated/appropriate. Mindful of cardiac/neuro hx.    Lamarr Price, PTA 10/04/2024 1:15 PM

## 2024-10-05 NOTE — Therapy (Signed)
 OUTPATIENT PHYSICAL THERAPY TREATMENT   Patient Name: Jessica Snow MRN: [id number] DOB:[birth year], 58 y.o., female Today's Date: 10/05/2024  END OF SESSION:   Past Medical History:  Diagnosis Date   Ascending aorta dilatation    Asthma    Chronic diastolic CHF (congestive heart failure) (HCC)    Chronic rhinitis    Cough    Dilation of pulmonary artery (HCC)    Edema, lower extremity    GERD (gastroesophageal reflux disease)    HTN (hypertension)    Mild aortic insufficiency    Morbid obesity (HCC)    Murmur    Pneumonia    Pre-diabetes    Past Surgical History:  Procedure Laterality Date   ANEURYSM COILING  01/24/2023   ANKLE RECONSTRUCTION     BREAST BIOPSY Right 08/25/2024   MM RT BREAST BX W LOC DEV 1ST LESION IMAGE BX SPEC STEREO GUIDE 08/25/2024 [facility]   TONSILLECTOMY     TOTAL VAGINAL HYSTERECTOMY  2007   Patient Active Problem List   Diagnosis Date Noted   Bee allergy  status 02/23/2024   Chronic nonintractable headache 02/23/2024   Encounter for annual health examination 02/19/2024   Tension headache 06/07/2023   Other sequelae following nontraumatic subarachnoid hemorrhage 02/19/2023   Pneumonia, organism unspecified(486) 02/19/2023   Severe persistent asthma (HCC) 02/19/2023   OSA (obstructive sleep apnea) 02/19/2023   Dyslipidemia 02/19/2023   Pneumonia of both lower lobes due to infectious organism 01/30/2023   SAH (subarachnoid hemorrhage) (HCC) 01/23/2023   Tinea pedis of both feet 06/23/2022   Hypertensive heart disease with chronic diastolic congestive heart failure (HCC) 06/17/2022   Pure hypercholesterolemia 06/17/2022   Dilation of pulmonary artery (HCC) 08/14/2021   Type 2 diabetes mellitus with other specified complication, without long-term current use of insulin (HCC) 02/01/2020   Snoring 09/29/2019   Diastolic heart failure (HCC) 08/31/2018   Chronic rhinitis 07/16/2018   Gastroesophageal reflux disease 04/10/2018    Cough variant asthma vs uacs  04/02/2018   Left tibial fracture 06/07/2016   Post-tonsillectomy hemorrhage 04/26/2016   Class 3 severe obesity due to excess calories with serious comorbidity and body mass index (BMI) of 45.0 to 49.9 in adult [facility]) 01/20/2012   Murmur 01/20/2012   Essential hypertension     PCP: Jarold Medici, MD  REFERRING PROVIDER: Curtis Debby PARAS, MD  REFERRING DIAG: M54.50 (ICD-10-CM) - Low back pain  Rationale for Evaluation and Treatment: Rehabilitation  THERAPY DIAG:  No diagnosis found.  ONSET DATE: March 2025  SUBJECTIVE:  SUBJECTIVE STATEMENT: 10/05/2024: ***  *** Patient reports she continues to be swollen at Rt posterior hip, states she has been alternating ice and heat. Patient states she was sore after last visit like I really worked out but no pain. Patient states she has minimal pain, compliant is that I can feel it is there with the weather.    EVAL: States this episode began during a prolonged hospitalization in March, reduced mobility. She does report some off and on pain with work activities previously. States she got XR of low back, states she was told she has some arthritis in her hip and spine. Reports symptoms were irritated with a near fall last week. Reports ~10 min standing tolerance, 30 min sitting tolerance. Walking limited by exertional SOB but back will bother her as well. No bowel/bladder changes, no N/T, no saddle anesthesia. No fevers/chills.  PERTINENT HISTORY:  asthma, CHF, GERD, HTN, aortic insufficiency, headaches, hx SAH, OSA Pt reports she will still get R sided headaches/tension, stable  Denies chest pain, will get some exertional SOB at times Scheduled for ankle surgery Dec 17th   PAIN:  Are you having pain: 1/10 R  lateral/posterior hip; 8-9/10 at worst ***   Per eval: Location/description: R low back, BIL hips; will occasionally refer to posterolateral R thigh  Best-worst over past week: 6-8/10  - aggravating factors: walking, bending forward, coughing (induces a pulling feeling in back), STS, lifting/carrying - Easing factors: heating pads, icy hot, leaning fwd  PRECAUTIONS: None  RED FLAGS: None   WEIGHT BEARING RESTRICTIONS: No  FALLS:  Has patient fallen in last 6 months? No - does report a near fall last week in which she irritated the R side of her back/hip  OCCUPATION: youth worker  PLOF: Independent  PATIENT GOALS:  be more mobile, feel less limited, be able to do more   NEXT MD VISIT: TBD  OBJECTIVE:  Note: Objective measures were completed at Evaluation unless otherwise noted.  DIAGNOSTIC FINDINGS:  No recent spine imaging in chart - pt reports XR showing arthritis   PATIENT SURVEYS:  ODI: 21/50; 42%   COGNITION: Overall cognitive status: Within functional limits for tasks assessed     SENSATION: Denies sensory complaints - mild tingling about lateral knee on R side, otherwise LT intact throughout  LUMBAR ROM:   AROM eval 09/22/24  Flexion Mid shin relieving   Extension 75% *    Right lateral flexion 25% *   Left lateral flexion    Right rotation 25% s 25% s  Left rotation 25% s * 50% s    (Blank rows = not tested) (Key: WFL = within functional limits not formally assessed, * = concordant pain, s = stiffness/stretching sensation, NT = not tested) Comment:    LOWER EXTREMITY MMT:    MMT Right eval Left eval  Hip flexion 4- 5  Hip abduction (modified sitting) 5 5  Hip internal rotation 4 4+  Hip external rotation 4 4+  Knee flexion 4+ 4+  Knee extension 4+ 4+  Ankle dorsiflexion     (Blank rows = not tested) (Key: WFL = within functional limits not formally assessed, * = concordant pain, s = stiffness/stretching sensation, NT = not tested)   Comments:     FUNCTIONAL TESTS:  SLS:  R: 3 sec  L: 1 sec   GAIT: Distance walked: within clinic Assistive device utilized: None Level of assistance: Complete Independence Comments: reduced truncal rotation BIL, mildly widened BOS    TREATMENT:  [facility] Adult PT Treatment:                                                DATE: 10/06/24 Therapeutic Exercise: *** Manual Therapy: *** Neuromuscular re-ed: *** Therapeutic Activity: *** Modalities: *** Self Care: ***    [facility] Adult PT Treatment:                                                DATE: 10/04/2024 Neuromuscular re-ed: Hooklying clamshell + green TB 10 x 5 sec Posterior pelvic tilts in hooklying on deflated coregeous ball Seated on red PB: Posterior pelvic tilts Small range marching Seated dead bug (marching) Standing hip abd + tactile cues IR assist Therapeutic Activity: Prone Lt knee flexion x 8 Prone prop on elbows x 1 min Prone press up 2x10 --> pain decreased on first set, then increased on 2nd set (discontinued)   [facility] Adult PT Treatment:                                                DATE: 10/01/2024 Manual Therapy: STM/TPR obturators in side lying Iliacus release (Rt>Lt) Neuromuscular re-ed: Hooklying:  Hip add isometric ball squeeze + TA activation 10 x 5 sec Bent knee fall outs + red TB x10 (bil) Clamshells + red TB 10 x5 sec Therapeutic Activity: Self-massage Rt obturators with tennis ball Myofascial release psoas with 4 ball (prone)    [facility] Adult PT Treatment:                                                DATE: 09/29/24 Therapeutic Exercise: LTR x12 BIL  Green band hooklying hip abd + 5 sec iso hold 2x8 Mini bridge iso on swiss ball (no hip clearance) x12 cues for breath control  Slow marches x8 BIL weaning UE support (emphasis on pelvic positioning and glute med endurance in stance)  HEP discussion/education  Manual Therapy: Teaching self-release w/ tennis ball at wall, R glutes;  trigger point x6 lateral and posterior hip with 45-60sec hold. Most concordant along superior glute max. Discussed indications and modification/cessation based on symptom response                                                                                                                    PATIENT EDUCATION:  Education details: Updated HEP  Person educated: Patient Education method: Explanation, Demonstration, Tactile cues, Verbal cues Education comprehension: verbalized understanding, returned demonstration, verbal cues required, tactile  cues required, and needs further education     HOME EXERCISE PROGRAM: Access Code: [email or web address] URL: [email or web address] Date: 10/04/2024 Prepared by: Lamarr Price  Program Notes Obturator massage with tennis ball (sitting): place tennis ball between sits bone and tailbone  Exercises - Seated Flexion Stretch  - 2-3 x daily - 1 sets - 8 reps - Standing March with Counter Support  - 2-3 x daily - 1 sets - 8 reps - Supine Lower Trunk Rotation with PLB  - 2 x daily - 7 x weekly - 2 sets - 20 reps - Hooklying Single Knee to Chest Stretch with Towel  - 2 x daily - 7 x weekly - 1 sets - 3-5 reps - 20 sec hold - Seated Transversus Abdominis Bracing  - 2 x daily - 7 x weekly - 1 sets - 10 reps - 5 sec hold - Supine Diaphragmatic Breathing  - 1 x daily - 7 x weekly - 1 sets - 10 reps - Seated Diaphragmatic Breathing  - 1 x daily - 7 x weekly - 1 sets - 10 reps - Seated Pelvic Tilt  - 1 x daily - 7 x weekly - 2 sets - 10 reps - Psoas Mobilization with Small Ball  - 1 x daily - 7 x weekly - 1 sets - 1 reps - Supine Hip Adduction Isometric with Ball  - 1 x daily - 7 x weekly - 1 sets - 10 reps - 5 sec hold - Hooklying Clamshell with Resistance  - 1 x daily - 7 x weekly - 3 sets - 10 reps - 5 sec hold - Hooklying Single Leg Bent Knee Fallouts with Resistance  - 1 x daily - 7 x weekly - 1 sets - 10 reps - Seated Isometric Hip Abduction with Belt  -  1 x daily - 7 x weekly - 1 sets - 10 reps - 5 sec hold  ASSESSMENT:  CLINICAL IMPRESSION:  10/05/2024: ***  *** Patient challenged with posterior pelvic tilt mechanics; verbal and tactile cues improved body mechanics, as well as modifying exercise from supine to seated on physioball. Hip external rotation compensation demonstrated with standing hip abduction; tactile assist provided to maintain hip neutral alignment with side leg raises. Added seated hip abduction isometric to HEP to address hip weakness. Patient continues to present with swelling at Rt posterior hip, however no significant increase in pain.   EVAL: Patient is a pleasant 58 y.o. woman who was seen today for physical therapy evaluation and treatment for back pain ongoing over past few months. Red flag questioning reassuring today. She endorses difficulty with heavier activities and prolonged activity, with standing and sitting tolerance limited. On exam she demonstrates concordant limitations in lumbar mobility, hip strength, and LE stability (SLS). Mild symptom irritability but remains around baseline as session goes on, no adverse events. Recommend PT to address aforementioned deficits with aim of maximizing tolerance to daily/work tasks. Of note she is scheduled for LE surgery in mid-December; discussed w/ pt at that point we would plan to discharge from PT given change in medical status, would need new referral to resume PT (for either LE or back). Pt departs today's session in no acute distress, all voiced questions/concerns addressed appropriately from PT perspective.      OBJECTIVE IMPAIRMENTS: decreased activity tolerance, decreased balance, decreased endurance, decreased mobility, difficulty walking, decreased ROM, decreased strength, impaired perceived functional ability, improper body mechanics, postural dysfunction, and pain.   ACTIVITY LIMITATIONS: carrying, lifting, bending, sitting, standing,  squatting, sleeping, stairs,  transfers, and locomotion level  PARTICIPATION LIMITATIONS: meal prep, cleaning, laundry, shopping, community activity, and occupation  PERSONAL FACTORS: Time since onset of injury/illness/exacerbation and 3+ comorbidities: asthma, CHF, GERD, HTN, aortic insufficiency, headaches, hx SAH, OSA are also affecting patient's functional outcome.   REHAB POTENTIAL: Fair given comorbidities  CLINICAL DECISION MAKING: Stable/uncomplicated  EVALUATION COMPLEXITY: Low   GOALS:  SHORT/LONG TERM GOALS: Target date: 10/13/2024  Pt will demonstrate appropriate understanding and performance of initially prescribed HEP in order to facilitate improved independence with management of symptoms.  Baseline: HEP established  Goal status: INITIAL   2. Pt will report at least 25% improvement in overall pain levels over past week in order to facilitate improved tolerance to typical daily activities.   Baseline: 6-8/10  Goal status: INITIAL    3. Pt will improve at least 15% on ODI in order to demonstrate improved perception of functional status due to symptoms.  Baseline: 41% Goal status: INITIAL  4. Pt will perform SLS on RLE for at least 10 sec in order to demonstrate improved postural stability.   Baseline: 1 sec LLE, 3 sec RLE   Goal status: INITIAL   5. Pt will report ability to stand for at least 25 min with less </=2/10 increase in pain on NPS in order to facilitate improved tolerance to work tasks.  Baseline: pain with standing 10 min  Goal status: INITIAL   PLAN:  PT FREQUENCY: 2x/week  PT DURATION: 4 weeks  PLANNED INTERVENTIONS: 97164- PT Re-evaluation, 97750- Physical Performance Testing, 97110-Therapeutic exercises, 97530- Therapeutic activity, [id number]- Neuromuscular re-education, 97535- Self Care, [id number]- Manual therapy, 97116- Gait training, [phone] (1-2 muscles), 20561 (3+ muscles)- Dry Needling, Patient/Family education, Balance training, Stair training, Taping, Joint mobilization,  Spinal mobilization, Cryotherapy, and Moist heat.  PLAN FOR NEXT SESSION: Pelvic floor relaxation/strengthening; hip strengthening. Work on [employer] exercises as appropriate with emphasis on core strength/stability, hip strength, balance. Symptom modification strategies as indicated/appropriate. Mindful of cardiac/neuro hx.    Alm DELENA Jenny PT, DPT 10/05/2024 10:53 AM

## 2024-10-06 ENCOUNTER — Encounter: Payer: Self-pay | Admitting: Physical Therapy

## 2024-10-06 ENCOUNTER — Ambulatory Visit: Admitting: Physical Therapy

## 2024-10-06 DIAGNOSIS — M25551 Pain in right hip: Secondary | ICD-10-CM

## 2024-10-06 DIAGNOSIS — M6281 Muscle weakness (generalized): Secondary | ICD-10-CM

## 2024-10-06 DIAGNOSIS — M5459 Other low back pain: Secondary | ICD-10-CM | POA: Diagnosis not present

## 2024-10-11 ENCOUNTER — Ambulatory Visit: Admitting: Physical Therapy

## 2024-10-12 ENCOUNTER — Ambulatory Visit: Payer: Self-pay | Admitting: Dermatology

## 2024-10-12 ENCOUNTER — Encounter: Payer: Self-pay | Admitting: Dermatology

## 2024-10-12 VITALS — BP 173/83

## 2024-10-12 DIAGNOSIS — B353 Tinea pedis: Secondary | ICD-10-CM

## 2024-10-12 MED ORDER — CLOTRIMAZOLE-BETAMETHASONE 1-0.05 % EX CREA: 1.0000 | TOPICAL_CREAM | Freq: Two times a day (BID) | CUTANEOUS | 3 refills | Status: AC

## 2024-10-12 NOTE — Progress Notes (Signed)
" ° °  New Patient Visit   Subjective  Jessica Snow is a 58 y.o. female who presents for a NEW PATIENT appointment to be examined for the concerns as listed below.  Patient (and/or pt guardian) consented to the use of AI-assisted tools for note generation.  TINEA PEDIS: Pt stated that she has been dealing with this issue for a few years now - around 2023. She previously seen her PCP who Rx nystatin  powder (once daily) & nystatin  cream (BID AM/PM). However, pt stated she felt like using the topicals made her Sx worse. She has also tried OTC shea butter and aquaphor but neither helped. Pt does have itching and rated it 10/10.   Are you nursing, pregnant or trying to conceive? No   The following portions of the chart were reviewed this encounter and updated as appropriate: medications, allergies, medical history  Review of Systems:  No other skin or systemic complaints except as noted in HPI or Assessment and Plan.  Objective  Well appearing patient in no apparent distress; mood and affect are within normal limits.   A focused examination was performed of the following areas: B/L feet   Relevant exam findings are noted in the Assessment and Plan.              Assessment & Plan   TINEA PEDIS Exam: Scaling and maceration web spaces and over distal and lateral soles.  KOH positive  Chronic tinea pedis with persistent itching and scaling between the toenails. Previous treatment with nystatin  and Lotrim spray was ineffective. K weight test positive for hyphae, confirming fungal infection. Fungal elements were identified.  - Prescribed clotrimazole  betamethasone  cream to apply twice daily for three weeks on, one week off, until the infection clears. Full resolution may take approximately three months..    No follow-ups on file.   Documentation: I have reviewed the above documentation for accuracy and completeness, and I agree with the above.  I, Shirron Maranda, CMA II, am  acting as scribe for:   Delon Lenis, DO     "

## 2024-10-12 NOTE — Patient Instructions (Addendum)
 VISIT SUMMARY:  Today, you were seen for persistent itching and scaling of your feet, particularly between the toenails. You have tried nystatin  and an antifungal spray without relief. A test confirmed a fungal infection.  YOUR PLAN:  -TINEA PEDIS:  Tinea pedis, commonly known as athlete's foot, is a fungal infection that causes itching, scaling, and sometimes thickening of the skin, especially between the toes.   You have been prescribed clotrimazole  betamethasone  cream to apply twice daily for three weeks on, one week off, until the infection clears. Full resolution may take approximately three months.  INSTRUCTIONS:  Please follow the prescribed treatment plan and apply the cream as directed. If you do not see improvement or if symptoms worsen, schedule a follow-up appointment.      Important Information  Due to recent changes in healthcare laws, you may see results of your pathology and/or laboratory studies on MyChart before the doctors have had a chance to review them. We understand that in some cases there may be results that are confusing or concerning to you. Please understand that not all results are received at the same time and often the doctors may need to interpret multiple results in order to provide you with the best plan of care or course of treatment. Therefore, we ask that you please give us  2 business days to thoroughly review all your results before contacting the office for clarification. Should we see a critical lab result, you will be contacted sooner.   If You Need Anything After Your Visit  If you have any questions or concerns for your doctor, please call our main line at [phone] If no one answers, please leave a voicemail as directed and we will return your call as soon as possible. Messages left after 4 pm will be answered the following business day.   You may also send us  a message via MyChart. We typically respond to MyChart messages within 1-2  business days.  For prescription refills, please ask your pharmacy to contact our office. Our fax number is [phone].  If you have an urgent issue when the clinic is closed that cannot wait until the next business day, you can page your doctor at the number below.    Please note that while we do our best to be available for urgent issues outside of office hours, we are not available 24/7.   If you have an urgent issue and are unable to reach us , you may choose to seek medical care at your doctor's office, retail clinic, urgent care center, or emergency room.  If you have a medical emergency, please immediately call 911 or go to the emergency department. In the event of inclement weather, please call our main line at [phone] for an update on the status of any delays or closures.  Dermatology Medication Tips: Please keep the boxes that topical medications come in in order to help keep track of the instructions about where and how to use these. Pharmacies typically print the medication instructions only on the boxes and not directly on the medication tubes.   If your medication is too expensive, please contact our office at [phone] or send us  a message through MyChart.   We are unable to tell what your co-pay for medications will be in advance as this is different depending on your insurance coverage. However, we may be able to find a substitute medication at lower cost or fill out paperwork to get insurance to cover a needed medication.  If a prior authorization is required to get your medication covered by your insurance company, please allow us  1-2 business days to complete this process.  Drug prices often vary depending on where the prescription is filled and some pharmacies may offer cheaper prices.  The website www.goodrx.com contains coupons for medications through different pharmacies. The prices here do not account for what the cost may be with help from insurance (it may  be cheaper with your insurance), but the website can give you the price if you did not use any insurance.  - You can print the associated coupon and take it with your prescription to the pharmacy.  - You may also stop by our office during regular business hours and pick up a GoodRx coupon card.  - If you need your prescription sent electronically to a different pharmacy, notify our office through [facility] or by phone at [phone]

## 2024-11-03 ENCOUNTER — Other Ambulatory Visit (HOSPITAL_BASED_OUTPATIENT_CLINIC_OR_DEPARTMENT_OTHER)

## 2024-11-03 DIAGNOSIS — I351 Nonrheumatic aortic (valve) insufficiency: Secondary | ICD-10-CM | POA: Diagnosis not present

## 2024-11-03 LAB — ECHOCARDIOGRAM COMPLETE
Area-P 1/2: 4.15 cm2
Calc EF: 67.6 %
S' Lateral: 2.93 cm
Single Plane A2C EF: 64.8 %
Single Plane A4C EF: 67.7 %

## 2024-11-04 ENCOUNTER — Ambulatory Visit: Admitting: Internal Medicine

## 2024-11-15 ENCOUNTER — Ambulatory Visit (HOSPITAL_BASED_OUTPATIENT_CLINIC_OR_DEPARTMENT_OTHER): Payer: Self-pay | Admitting: Cardiology

## 2024-11-17 ENCOUNTER — Ambulatory Visit

## 2024-11-17 DIAGNOSIS — J4550 Severe persistent asthma, uncomplicated: Secondary | ICD-10-CM | POA: Diagnosis not present

## 2024-12-15 ENCOUNTER — Ambulatory Visit

## 2024-12-30 ENCOUNTER — Ambulatory Visit: Admitting: Allergy

## 2025-01-10 ENCOUNTER — Ambulatory Visit: Admitting: Dermatology

## 2025-02-21 ENCOUNTER — Encounter: Payer: Self-pay | Admitting: Internal Medicine
